# Patient Record
Sex: Male | Born: 1945 | Race: White | Hispanic: No | Marital: Married | Smoking: Former smoker
Health system: Southern US, Community
[De-identification: ages and names within clinical notes are randomized; demographics above are authoritative.]

## PROBLEM LIST (undated history)

## (undated) DIAGNOSIS — I219 Acute myocardial infarction, unspecified: Secondary | ICD-10-CM

## (undated) DIAGNOSIS — R200 Anesthesia of skin: Secondary | ICD-10-CM

## (undated) DIAGNOSIS — I10 Essential (primary) hypertension: Secondary | ICD-10-CM

## (undated) DIAGNOSIS — M47812 Spondylosis without myelopathy or radiculopathy, cervical region: Secondary | ICD-10-CM

## (undated) DIAGNOSIS — R202 Paresthesia of skin: Secondary | ICD-10-CM

## (undated) DIAGNOSIS — N189 Chronic kidney disease, unspecified: Secondary | ICD-10-CM

## (undated) DIAGNOSIS — I2510 Atherosclerotic heart disease of native coronary artery without angina pectoris: Secondary | ICD-10-CM

## (undated) DIAGNOSIS — M1990 Unspecified osteoarthritis, unspecified site: Secondary | ICD-10-CM

## (undated) DIAGNOSIS — I82409 Acute embolism and thrombosis of unspecified deep veins of unspecified lower extremity: Secondary | ICD-10-CM

## (undated) HISTORY — DX: Essential (primary) hypertension: I10

## (undated) HISTORY — DX: Chronic kidney disease, unspecified: N18.9

## (undated) HISTORY — DX: Atherosclerotic heart disease of native coronary artery without angina pectoris: I25.10

## (undated) HISTORY — PX: CARDIAC CATHETERIZATION: SHX172

## (undated) HISTORY — PX: JOINT REPLACEMENT: SHX530

## (undated) HISTORY — DX: Acute embolism and thrombosis of unspecified deep veins of unspecified lower extremity: I82.409

## (undated) HISTORY — DX: Acute myocardial infarction, unspecified: I21.9

---

## 2012-02-01 DIAGNOSIS — Z23 Encounter for immunization: Secondary | ICD-10-CM | POA: Diagnosis not present

## 2012-04-26 DIAGNOSIS — C44319 Basal cell carcinoma of skin of other parts of face: Secondary | ICD-10-CM | POA: Diagnosis not present

## 2012-04-26 DIAGNOSIS — D485 Neoplasm of uncertain behavior of skin: Secondary | ICD-10-CM | POA: Diagnosis not present

## 2012-04-26 DIAGNOSIS — L570 Actinic keratosis: Secondary | ICD-10-CM | POA: Diagnosis not present

## 2012-04-29 DIAGNOSIS — I2510 Atherosclerotic heart disease of native coronary artery without angina pectoris: Secondary | ICD-10-CM | POA: Diagnosis not present

## 2012-04-29 DIAGNOSIS — Z125 Encounter for screening for malignant neoplasm of prostate: Secondary | ICD-10-CM | POA: Diagnosis not present

## 2012-04-29 DIAGNOSIS — R350 Frequency of micturition: Secondary | ICD-10-CM | POA: Diagnosis not present

## 2012-04-29 DIAGNOSIS — I10 Essential (primary) hypertension: Secondary | ICD-10-CM | POA: Diagnosis not present

## 2012-04-29 DIAGNOSIS — N400 Enlarged prostate without lower urinary tract symptoms: Secondary | ICD-10-CM | POA: Diagnosis not present

## 2012-04-29 DIAGNOSIS — E785 Hyperlipidemia, unspecified: Secondary | ICD-10-CM | POA: Diagnosis not present

## 2012-05-08 DIAGNOSIS — R972 Elevated prostate specific antigen [PSA]: Secondary | ICD-10-CM | POA: Diagnosis not present

## 2012-05-08 DIAGNOSIS — N402 Nodular prostate without lower urinary tract symptoms: Secondary | ICD-10-CM | POA: Diagnosis not present

## 2012-05-20 DIAGNOSIS — Z9889 Other specified postprocedural states: Secondary | ICD-10-CM | POA: Diagnosis not present

## 2012-05-20 DIAGNOSIS — I2510 Atherosclerotic heart disease of native coronary artery without angina pectoris: Secondary | ICD-10-CM | POA: Diagnosis not present

## 2012-05-20 DIAGNOSIS — I10 Essential (primary) hypertension: Secondary | ICD-10-CM | POA: Diagnosis not present

## 2012-05-20 DIAGNOSIS — E785 Hyperlipidemia, unspecified: Secondary | ICD-10-CM | POA: Diagnosis not present

## 2012-05-20 DIAGNOSIS — I498 Other specified cardiac arrhythmias: Secondary | ICD-10-CM | POA: Diagnosis not present

## 2012-05-22 DIAGNOSIS — C44319 Basal cell carcinoma of skin of other parts of face: Secondary | ICD-10-CM | POA: Diagnosis not present

## 2012-05-28 DIAGNOSIS — Z4802 Encounter for removal of sutures: Secondary | ICD-10-CM | POA: Diagnosis not present

## 2012-06-27 DIAGNOSIS — I10 Essential (primary) hypertension: Secondary | ICD-10-CM | POA: Diagnosis not present

## 2012-06-27 DIAGNOSIS — I2510 Atherosclerotic heart disease of native coronary artery without angina pectoris: Secondary | ICD-10-CM | POA: Diagnosis not present

## 2012-06-27 DIAGNOSIS — E785 Hyperlipidemia, unspecified: Secondary | ICD-10-CM | POA: Diagnosis not present

## 2012-09-02 DIAGNOSIS — I10 Essential (primary) hypertension: Secondary | ICD-10-CM | POA: Diagnosis not present

## 2012-09-02 DIAGNOSIS — E785 Hyperlipidemia, unspecified: Secondary | ICD-10-CM | POA: Diagnosis not present

## 2012-09-02 DIAGNOSIS — I2510 Atherosclerotic heart disease of native coronary artery without angina pectoris: Secondary | ICD-10-CM | POA: Diagnosis not present

## 2012-09-24 DIAGNOSIS — E785 Hyperlipidemia, unspecified: Secondary | ICD-10-CM | POA: Diagnosis not present

## 2012-09-24 DIAGNOSIS — I2510 Atherosclerotic heart disease of native coronary artery without angina pectoris: Secondary | ICD-10-CM | POA: Diagnosis not present

## 2012-09-24 DIAGNOSIS — I10 Essential (primary) hypertension: Secondary | ICD-10-CM | POA: Diagnosis not present

## 2012-10-23 DIAGNOSIS — Z85828 Personal history of other malignant neoplasm of skin: Secondary | ICD-10-CM | POA: Diagnosis not present

## 2012-10-23 DIAGNOSIS — L570 Actinic keratosis: Secondary | ICD-10-CM | POA: Diagnosis not present

## 2012-10-23 DIAGNOSIS — L821 Other seborrheic keratosis: Secondary | ICD-10-CM | POA: Diagnosis not present

## 2013-02-18 DIAGNOSIS — Z85828 Personal history of other malignant neoplasm of skin: Secondary | ICD-10-CM | POA: Diagnosis not present

## 2013-02-18 DIAGNOSIS — L723 Sebaceous cyst: Secondary | ICD-10-CM | POA: Diagnosis not present

## 2013-02-18 DIAGNOSIS — D485 Neoplasm of uncertain behavior of skin: Secondary | ICD-10-CM | POA: Diagnosis not present

## 2013-02-18 DIAGNOSIS — L570 Actinic keratosis: Secondary | ICD-10-CM | POA: Diagnosis not present

## 2013-02-18 DIAGNOSIS — B079 Viral wart, unspecified: Secondary | ICD-10-CM | POA: Diagnosis not present

## 2013-03-26 DIAGNOSIS — E785 Hyperlipidemia, unspecified: Secondary | ICD-10-CM | POA: Diagnosis not present

## 2013-03-26 DIAGNOSIS — I10 Essential (primary) hypertension: Secondary | ICD-10-CM | POA: Diagnosis not present

## 2013-03-26 DIAGNOSIS — I2510 Atherosclerotic heart disease of native coronary artery without angina pectoris: Secondary | ICD-10-CM | POA: Diagnosis not present

## 2013-03-26 DIAGNOSIS — R972 Elevated prostate specific antigen [PSA]: Secondary | ICD-10-CM | POA: Diagnosis not present

## 2013-03-26 DIAGNOSIS — Z0000 Encounter for general adult medical examination without abnormal findings: Secondary | ICD-10-CM | POA: Diagnosis not present

## 2013-04-24 ENCOUNTER — Encounter: Payer: Self-pay | Admitting: *Deleted

## 2013-04-25 ENCOUNTER — Encounter: Payer: Self-pay | Admitting: *Deleted

## 2013-04-25 ENCOUNTER — Ambulatory Visit (INDEPENDENT_AMBULATORY_CARE_PROVIDER_SITE_OTHER): Payer: Medicare Other | Admitting: Cardiovascular Disease

## 2013-04-25 VITALS — BP 157/85 | HR 55 | Ht 69.0 in | Wt 238.0 lb

## 2013-04-25 DIAGNOSIS — N138 Other obstructive and reflux uropathy: Secondary | ICD-10-CM | POA: Diagnosis not present

## 2013-04-25 DIAGNOSIS — E119 Type 2 diabetes mellitus without complications: Secondary | ICD-10-CM | POA: Diagnosis not present

## 2013-04-25 DIAGNOSIS — E785 Hyperlipidemia, unspecified: Secondary | ICD-10-CM | POA: Diagnosis not present

## 2013-04-25 DIAGNOSIS — I2510 Atherosclerotic heart disease of native coronary artery without angina pectoris: Secondary | ICD-10-CM | POA: Diagnosis not present

## 2013-04-25 DIAGNOSIS — R739 Hyperglycemia, unspecified: Secondary | ICD-10-CM | POA: Insufficient documentation

## 2013-04-25 DIAGNOSIS — R351 Nocturia: Secondary | ICD-10-CM | POA: Diagnosis not present

## 2013-04-25 DIAGNOSIS — N139 Obstructive and reflux uropathy, unspecified: Secondary | ICD-10-CM | POA: Diagnosis not present

## 2013-04-25 DIAGNOSIS — I10 Essential (primary) hypertension: Secondary | ICD-10-CM | POA: Insufficient documentation

## 2013-04-25 DIAGNOSIS — N453 Epididymo-orchitis: Secondary | ICD-10-CM | POA: Diagnosis not present

## 2013-04-25 DIAGNOSIS — N401 Enlarged prostate with lower urinary tract symptoms: Secondary | ICD-10-CM | POA: Diagnosis not present

## 2013-04-25 NOTE — Assessment & Plan Note (Signed)
Stable with no angina and good activity level.  Continue medical Rx  

## 2013-04-25 NOTE — Assessment & Plan Note (Signed)
F/U labs next visit suspect he will do better with lipitor or crestor to get LDL to 70

## 2013-04-25 NOTE — Assessment & Plan Note (Signed)
Lisinopril just increased Follow home readings Suspect we would do better changing to Hyzaar 100/25  Will see in 6 weeks

## 2013-04-25 NOTE — Patient Instructions (Addendum)
Your physician recommends that you schedule a follow-up appointment in: [place] Your physician recommends that you continue on your current medications as directed. Please refer to the Current Medication list given to you today. DR Gwendolyn Grant   [phone] FOR  FAMILY  DOCTOR   DR [facility] [phone] DR  [name]  FOR  KNEES [phone]

## 2013-04-25 NOTE — Assessment & Plan Note (Signed)
Discussed low carb diet.  Target hemoglobin A1c is 6.5 or less.  Continue current medications.  

## 2013-04-25 NOTE — Progress Notes (Signed)
Patient ID: Gregory Guerrero, male   DOB: 04/25/1945, 68 y.o.   MRN: [id number]   68 yo form [place]  History of CAD.  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  Had some chest pains before procedures.  He has elevated lipids , HTN and DM.  He is sedentary Quit smoking 30 years ago.  Mild exertional dyspnea.  Poor diet.  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  Saw a cardiologist in [place] Last month but didn't like them  LDL was 113  He has a BP cuff at home but has not used it.       ROS: Denies fever, malais, weight loss, blurry vision, decreased visual acuity, cough, sputum, SOB, hemoptysis, pleuritic pain, palpitaitons, heartburn, abdominal pain, melena, lower extremity edema, claudication, or rash.  All other systems reviewed and negative   General: Affect appropriate Overweigtht white male  HEENT: normal Neck supple with no adenopathy JVP normal no bruits no thyromegaly Lungs clear with no wheezing and good diaphragmatic motion Heart:  S1/S2 no murmur,rub, gallop or click PMI normal Abdomen: benighn, BS positve, no tenderness, no AAA no bruit.  No HSM or HJR Distal pulses intact with no bruits No edema Neuro non-focal Skin warm and dry No muscular weakness  Medications Current Outpatient Prescriptions  Medication Sig Dispense Refill  . Ascorbic Acid (VITAMIN C) 1000 MG tablet Take 1,000 mg by mouth daily.      Marland Kitchen aspirin 325 MG EC tablet Take 325 mg by mouth daily.      Marland Kitchen glucosamine-chondroitin 500-400 MG tablet Take 1 tablet by mouth daily.      Marland Kitchen lisinopril (PRINIVIL,ZESTRIL) 20 MG tablet Take 20 mg by mouth 2 (two) times daily.      . metoprolol (LOPRESSOR) 50 MG tablet Take 50 mg by mouth 2 (two) times daily.      . Multiple Vitamin (MULTIVITAMIN WITH MINERALS) TABS tablet Take 1 tablet by mouth daily.      . simvastatin (ZOCOR) 40 MG tablet Take 40 mg by mouth daily.       No current facility-administered  medications for this visit.    Allergies Review of patient's allergies indicates no known allergies.  Family History: Family History  Problem Relation Age of Onset  . Cancer - Other Mother   . Heart attack Father   . Uterine cancer Sister     Social History: History   Social History  . Marital Status: Married    Spouse Name: N/A    Number of Children: N/A  . Years of Education: N/A   Occupational History  . Not on file.   Social History Main Topics  . Smoking status: Former Smoker    Types: Cigarettes    Quit date: 04/26/1983  . Smokeless tobacco: Never Used  . Alcohol Use: Not on file  . Drug Use: Not on file  . Sexual Activity: Not on file   Other Topics Concern  . Not on file   Social History Narrative  . No narrative on file    Electrocardiogram:  SR rate 54 normal   Assessment and Plan

## 2013-05-02 ENCOUNTER — Telehealth: Payer: Self-pay | Admitting: Cardiovascular Disease

## 2013-05-02 NOTE — Telephone Encounter (Signed)
ROI faxed to [facility] at [phone]  P) [phone]

## 2013-05-13 ENCOUNTER — Encounter: Payer: Self-pay | Admitting: Cardiovascular Disease

## 2013-05-13 ENCOUNTER — Ambulatory Visit (INDEPENDENT_AMBULATORY_CARE_PROVIDER_SITE_OTHER): Payer: Medicare Other | Admitting: Cardiovascular Disease

## 2013-05-13 VITALS — BP 155/96 | HR 60 | Ht 69.0 in | Wt 243.0 lb

## 2013-05-13 DIAGNOSIS — I10 Essential (primary) hypertension: Secondary | ICD-10-CM

## 2013-05-13 DIAGNOSIS — E785 Hyperlipidemia, unspecified: Secondary | ICD-10-CM

## 2013-05-13 DIAGNOSIS — I2510 Atherosclerotic heart disease of native coronary artery without angina pectoris: Secondary | ICD-10-CM | POA: Diagnosis not present

## 2013-05-13 MED ORDER — LOSARTAN POTASSIUM-HCTZ 100-25 MG PO TABS: 1.0000 | ORAL_TABLET | Freq: Every day | ORAL | Status: DC

## 2013-05-13 NOTE — Assessment & Plan Note (Signed)
Stop lisinopril  Add Hyzaar 100/25  BMET in 2 weeks f/u with me next available

## 2013-05-13 NOTE — Assessment & Plan Note (Signed)
Labs to be checked likely change to lipitor or crestor to achieve LDL under 70

## 2013-05-13 NOTE — Patient Instructions (Addendum)
Your physician has recommended you make the following change in your medication:   1. Stop Lisinopril.  2. Start Hyzaar 100-25 mg 1 tablet daily.   You will have fasting lipids and bmet done in 2 weeks at lab of your choice.  Your physician recommends that you schedule a follow-up appointment with Dr. Bing Plume Available appt.

## 2013-05-13 NOTE — Progress Notes (Signed)
Patient ID: Gregory Guerrero, male   DOB: 03-17-1946, 68 y.o.   MRN: [id number] 68 yo form [place] History of CAD. 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 Had some chest pains before procedures. He has elevated lipids , HTN and DM. He is sedentary Quit smoking 30 years ago. Mild exertional dyspnea. Poor diet. 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 Saw a cardiologist in [place] Last month but didn't like them LDL was 113   Home BP readings are high BP cuff is irratic and doesn't correlate with our BP though      ROS: Denies fever, malais, weight loss, blurry vision, decreased visual acuity, cough, sputum, SOB, hemoptysis, pleuritic pain, palpitaitons, heartburn, abdominal pain, melena, lower extremity edema, claudication, or rash.  All other systems reviewed and negative  General: Affect appropriate Healthy:  appears stated age 37: normal Neck supple with no adenopathy JVP normal no bruits no thyromegaly Lungs clear with no wheezing and good diaphragmatic motion Heart:  S1/S2 no murmur, no rub, gallop or click PMI normal Abdomen: benighn, BS positve, no tenderness, no AAA no bruit.  No HSM or HJR Distal pulses intact with no bruits No edema Neuro non-focal Skin warm and dry No muscular weakness   Current Outpatient Prescriptions  Medication Sig Dispense Refill  . Ascorbic Acid (VITAMIN C) 1000 MG tablet Take 1,000 mg by mouth daily.      Marland Kitchen aspirin 325 MG EC tablet Take 325 mg by mouth daily.      Marland Kitchen glucosamine-chondroitin 500-400 MG tablet Take 1 tablet by mouth daily.      Marland Kitchen lisinopril (PRINIVIL,ZESTRIL) 20 MG tablet Take 20 mg by mouth 2 (two) times daily.      . metoprolol (LOPRESSOR) 50 MG tablet Take 50 mg by mouth 2 (two) times daily.      . Multiple Vitamin (MULTIVITAMIN WITH MINERALS) TABS tablet Take 1 tablet by mouth daily.      . simvastatin (ZOCOR) 40 MG tablet Take 40 mg by mouth daily.       No current  facility-administered medications for this visit.    Allergies  Review of patient's allergies indicates no known allergies.  Electrocardiogram:  04/25/13  SR rate 54 normal   Assessment and Plan

## 2013-05-13 NOTE — Assessment & Plan Note (Signed)
Stable with no angina and good activity level.  Continue medical Rx  

## 2013-05-20 DIAGNOSIS — N453 Epididymo-orchitis: Secondary | ICD-10-CM | POA: Diagnosis not present

## 2013-05-20 DIAGNOSIS — N138 Other obstructive and reflux uropathy: Secondary | ICD-10-CM | POA: Diagnosis not present

## 2013-05-20 DIAGNOSIS — R351 Nocturia: Secondary | ICD-10-CM | POA: Diagnosis not present

## 2013-05-20 DIAGNOSIS — N401 Enlarged prostate with lower urinary tract symptoms: Secondary | ICD-10-CM | POA: Diagnosis not present

## 2013-05-20 DIAGNOSIS — N139 Obstructive and reflux uropathy, unspecified: Secondary | ICD-10-CM | POA: Diagnosis not present

## 2013-05-23 ENCOUNTER — Telehealth: Payer: Self-pay | Admitting: Cardiovascular Disease

## 2013-05-23 NOTE — Telephone Encounter (Signed)
Letterhead rec Back No Records On this Pt 2.6.15./kdm

## 2013-06-24 ENCOUNTER — Ambulatory Visit (INDEPENDENT_AMBULATORY_CARE_PROVIDER_SITE_OTHER): Payer: Medicare Other | Admitting: Cardiovascular Disease

## 2013-06-24 ENCOUNTER — Encounter: Payer: Self-pay | Admitting: Cardiovascular Disease

## 2013-06-24 VITALS — BP 147/91 | HR 59 | Ht 69.0 in | Wt 242.0 lb

## 2013-06-24 DIAGNOSIS — E785 Hyperlipidemia, unspecified: Secondary | ICD-10-CM

## 2013-06-24 DIAGNOSIS — I10 Essential (primary) hypertension: Secondary | ICD-10-CM

## 2013-06-24 DIAGNOSIS — Z79899 Other long term (current) drug therapy: Secondary | ICD-10-CM

## 2013-06-24 DIAGNOSIS — I2510 Atherosclerotic heart disease of native coronary artery without angina pectoris: Secondary | ICD-10-CM | POA: Diagnosis not present

## 2013-06-24 DIAGNOSIS — E119 Type 2 diabetes mellitus without complications: Secondary | ICD-10-CM

## 2013-06-24 LAB — HEPATIC FUNCTION PANEL
ALBUMIN: 4.2 g/dL (ref 3.5–5.2)
ALT: 19 U/L (ref 0–53)
AST: 33 U/L (ref 0–37)
Alkaline Phosphatase: 71 U/L (ref 39–117)
Bilirubin, Direct: 0.2 mg/dL (ref 0.0–0.3)
TOTAL PROTEIN: 7.2 g/dL (ref 6.0–8.3)
Total Bilirubin: 1.4 mg/dL — ABNORMAL HIGH (ref 0.3–1.2)

## 2013-06-24 LAB — LIPID PANEL
CHOL/HDL RATIO: 4
CHOLESTEROL: 178 mg/dL (ref 0–200)
HDL: 46.8 mg/dL (ref >39.00)
LDL CALC: 115 mg/dL — AB (ref 0–99)
TRIGLYCERIDES: 81 mg/dL (ref 0.0–149.0)
VLDL: 16.2 mg/dL (ref 0.0–40.0)

## 2013-06-24 NOTE — Progress Notes (Signed)
Patient ID: Gregory Guerrero, male   DOB: 1945/09/30, 68 y.o.   MRN: [id number] 68 yo form [place] History of CAD. 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 Had some chest pains before procedures. He has elevated lipids , HTN and DM. He is sedentary Quit smoking 30 years ago. Mild exertional dyspnea. Poor diet. 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 Saw a cardiologist in [place] Last month but didn't like them LDL was 113  Home BP readings are high BP cuff is irratic and doesn't correlate with our BP though   Taking hyzaar at night BP still high      ROS: Denies fever, malais, weight loss, blurry vision, decreased visual acuity, cough, sputum, SOB, hemoptysis, pleuritic pain, palpitaitons, heartburn, abdominal pain, melena, lower extremity edema, claudication, or rash.  All other systems reviewed and negative  General: Affect appropriate Healthy:  appears stated age 26: normal Neck supple with no adenopathy JVP normal no bruits no thyromegaly Lungs clear with no wheezing and good diaphragmatic motion Heart:  S1/S2 no murmur, no rub, gallop or click PMI normal Abdomen: benighn, BS positve, no tenderness, no AAA no bruit.  No HSM or HJR Distal pulses intact with no bruits No edema Neuro non-focal Skin warm and dry No muscular weakness   Current Outpatient Prescriptions  Medication Sig Dispense Refill  . Ascorbic Acid (VITAMIN C) 1000 MG tablet Take 1,000 mg by mouth daily.      Marland Kitchen aspirin 325 MG EC tablet Take 325 mg by mouth daily.      Marland Kitchen glucosamine-chondroitin 500-400 MG tablet Take 1 tablet by mouth daily.      Marland Kitchen losartan-hydrochlorothiazide (HYZAAR) 100-25 MG per tablet Take 1 tablet by mouth daily.  90 tablet  1  . metoprolol (LOPRESSOR) 50 MG tablet Take 50 mg by mouth 2 (two) times daily.      . Multiple Vitamin (MULTIVITAMIN WITH MINERALS) TABS tablet Take 1 tablet by mouth daily.      . simvastatin (ZOCOR) 40 MG  tablet Take 40 mg by mouth daily.      . tamsulosin (FLOMAX) 0.4 MG CAPS capsule Take 0.4 mg by mouth daily.        No current facility-administered medications for this visit.    Allergies  Review of patient's allergies indicates no known allergies.  Electrocardiogram:  Assessment and Plan

## 2013-06-24 NOTE — Assessment & Plan Note (Signed)
Stable with no angina and good activity level.  Continue medical Rx  

## 2013-06-24 NOTE — Addendum Note (Signed)
Addended by: Eulis Foster on: 06/24/2013 11:12 AM   Modules accepted: Orders

## 2013-06-24 NOTE — Assessment & Plan Note (Signed)
Will have fasting lipid and liver profile today may need stronger statin

## 2013-06-24 NOTE — Assessment & Plan Note (Signed)
Discussed low sodium diet and weight loss.  Take hyzaar in am  If still high next visit will have  To add ? norvasc latter in afternoon

## 2013-06-24 NOTE — Patient Instructions (Signed)
Your physician recommends that you schedule a follow-up appointment in: [place] Your physician recommends that you continue on your current medications as directed. Please refer to the Current Medication list given to you today.  Your physician recommends that you return for lab work in:  [place]  [place]  [place]  [place]  [phone]

## 2013-06-24 NOTE — Assessment & Plan Note (Signed)
Discussed low carb diet.  Target hemoglobin A1c is 6.5 or less.  Continue current medications.  

## 2013-08-21 ENCOUNTER — Ambulatory Visit (INDEPENDENT_AMBULATORY_CARE_PROVIDER_SITE_OTHER): Payer: Medicare Other | Admitting: Cardiovascular Disease

## 2013-08-21 ENCOUNTER — Encounter: Payer: Self-pay | Admitting: Cardiovascular Disease

## 2013-08-21 VITALS — BP 146/88 | HR 48 | Ht 69.0 in | Wt 237.8 lb

## 2013-08-21 DIAGNOSIS — E119 Type 2 diabetes mellitus without complications: Secondary | ICD-10-CM | POA: Diagnosis not present

## 2013-08-21 DIAGNOSIS — E785 Hyperlipidemia, unspecified: Secondary | ICD-10-CM

## 2013-08-21 DIAGNOSIS — I2510 Atherosclerotic heart disease of native coronary artery without angina pectoris: Secondary | ICD-10-CM

## 2013-08-21 DIAGNOSIS — I10 Essential (primary) hypertension: Secondary | ICD-10-CM | POA: Diagnosis not present

## 2013-08-21 MED ORDER — AMLODIPINE BESYLATE 10 MG PO TABS: 10.0000 mg | ORAL_TABLET | Freq: Every day | ORAL | Status: DC

## 2013-08-21 NOTE — Assessment & Plan Note (Signed)
Discussed low carb diet.  Target hemoglobin A1c is 6.5 or less.  Continue current medications.  

## 2013-08-21 NOTE — Assessment & Plan Note (Signed)
Well controlled.  Continue current medications and low sodium Dash type diet.    

## 2013-08-21 NOTE — Assessment & Plan Note (Signed)
Stable with no angina and good activity level.  Continue medical Rx  

## 2013-08-21 NOTE — Assessment & Plan Note (Signed)
Cholesterol is at goal.  Continue current dose of statin and diet Rx.  No myalgias or side effects.  F/U  LFT's in 6 months. Lab Results  Component Value Date   LDLCALC 115* 06/24/2013

## 2013-08-21 NOTE — Patient Instructions (Addendum)
Your physician recommends that you schedule a follow-up appointment in: NEXT AVAILABLE WITH  DR [facility] Your physician has recommended you make the following change in your medication: START  AMLODIPINE 10 MG    1  EVERY DAY

## 2013-08-21 NOTE — Progress Notes (Signed)
Patient ID: Gregory Guerrero, male   DOB: [birth year], 68 y.o.   MRN: [id number] 68 yo form [place] History of CAD. 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 Had some chest pains before procedures. He has elevated lipids , HTN and DM. He is sedentary Quit smoking 30 years ago. Mild exertional dyspnea. Poor diet. 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 Saw a cardiologist in [place] Last month but didn't like them LDL was 113  Here last mont 115   Home BP readings are high BP cuff is irratic and doesn't correlate with our BP though   Last visit had him take Hyzaar in am       ROS: Denies fever, malais, weight loss, blurry vision, decreased visual acuity, cough, sputum, SOB, hemoptysis, pleuritic pain, palpitaitons, heartburn, abdominal pain, melena, lower extremity edema, claudication, or rash.  All other systems reviewed and negative  General: Affect appropriate Healthy:  appears stated age 66: normal Neck supple with no adenopathy JVP normal no bruits no thyromegaly Lungs clear with no wheezing and good diaphragmatic motion Heart:  S1/S2 no murmur, no rub, gallop or click PMI normal Abdomen: benighn, BS positve, no tenderness, no AAA no bruit.  No HSM or HJR Distal pulses intact with no bruits No edema Neuro non-focal Skin warm and dry No muscular weakness   Current Outpatient Prescriptions  Medication Sig Dispense Refill  . Ascorbic Acid (VITAMIN C) 1000 MG tablet Take 1,000 mg by mouth daily.      Marland Kitchen aspirin 325 MG EC tablet Take 325 mg by mouth daily.      Marland Kitchen glucosamine-chondroitin 500-400 MG tablet Take 1 tablet by mouth daily.      Marland Kitchen losartan-hydrochlorothiazide (HYZAAR) 100-25 MG per tablet Take 1 tablet by mouth daily.  90 tablet  1  . Multiple Vitamin (MULTIVITAMIN WITH MINERALS) TABS tablet Take 1 tablet by mouth daily.      . simvastatin (ZOCOR) 40 MG tablet Take 40 mg by mouth daily.       No current  facility-administered medications for this visit.    Allergies  Review of patient's allergies indicates no known allergies.  Electrocardiogram:  Assessment and Plan

## 2013-10-23 DIAGNOSIS — M25569 Pain in unspecified knee: Secondary | ICD-10-CM | POA: Diagnosis not present

## 2013-10-28 ENCOUNTER — Ambulatory Visit: Payer: Medicare Other | Admitting: Cardiovascular Disease

## 2013-11-26 DIAGNOSIS — M1710 Unilateral primary osteoarthritis, unspecified knee: Secondary | ICD-10-CM | POA: Diagnosis not present

## 2013-12-03 ENCOUNTER — Ambulatory Visit (INDEPENDENT_AMBULATORY_CARE_PROVIDER_SITE_OTHER): Payer: Medicare Other | Admitting: Cardiovascular Disease

## 2013-12-03 ENCOUNTER — Encounter: Payer: Self-pay | Admitting: Cardiovascular Disease

## 2013-12-03 VITALS — BP 140/88 | HR 65 | Ht 69.0 in | Wt 233.0 lb

## 2013-12-03 DIAGNOSIS — E785 Hyperlipidemia, unspecified: Secondary | ICD-10-CM | POA: Diagnosis not present

## 2013-12-03 DIAGNOSIS — I2510 Atherosclerotic heart disease of native coronary artery without angina pectoris: Secondary | ICD-10-CM

## 2013-12-03 DIAGNOSIS — I10 Essential (primary) hypertension: Secondary | ICD-10-CM

## 2013-12-03 MED ORDER — AMLODIPINE BESYLATE 10 MG PO TABS: 10.0000 mg | ORAL_TABLET | Freq: Every day | ORAL | Status: DC

## 2013-12-03 MED ORDER — LOSARTAN POTASSIUM-HCTZ 100-25 MG PO TABS: 1.0000 | ORAL_TABLET | Freq: Every day | ORAL | Status: DC

## 2013-12-03 NOTE — Assessment & Plan Note (Signed)
Stable with no angina and good activity level.  Continue medical Rx  

## 2013-12-03 NOTE — Patient Instructions (Signed)
Your physician recommends that you continue on your current medications as directed. Please refer to the Current Medication list given to you today.  Your physician wants you to follow-up in: 1 year with Dr. Nishan. You will receive a reminder letter in the mail two months in advance. If you don't receive a letter, please call our office to schedule the follow-up appointment.  

## 2013-12-03 NOTE — Progress Notes (Signed)
Patient ID: Gregory Guerrero, male   DOB: [birth year], 68 y.o.   MRN: [id number] 68 yo form [place] History of CAD. 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 Had some chest pains before procedures. He has elevated lipids , HTN and DM. He is sedentary Quit smoking 30 years ago. Mild exertional dyspnea. Poor diet. 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 Saw a cardiologist in [place] Last month but didn't like them LDL was 113 Here last mont 115  Home BP readings are high BP cuff is irratic and doesn't correlate with our BP though  Last visit had him take Hyzaar in am    Not compliant with both meds ran out of hyzaar   ROS: Denies fever, malais, weight loss, blurry vision, decreased visual acuity, cough, sputum, SOB, hemoptysis, pleuritic pain, palpitaitons, heartburn, abdominal pain, melena, lower extremity edema, claudication, or rash.  All other systems reviewed and negative  General: Affect appropriate Healthy:  appears stated age 23: normal Neck supple with no adenopathy JVP normal no bruits no thyromegaly Lungs clear with no wheezing and good diaphragmatic motion Heart:  S1/S2 no murmur, no rub, gallop or click PMI normal Abdomen: benighn, BS positve, no tenderness, no AAA no bruit.  No HSM or HJR Distal pulses intact with no bruits No edema Neuro non-focal Skin warm and dry No muscular weakness   Current Outpatient Prescriptions  Medication Sig Dispense Refill  . amLODipine (NORVASC) 10 MG tablet Take 1 tablet (10 mg total) by mouth daily.  30 tablet  11  . Ascorbic Acid (VITAMIN C) 1000 MG tablet Take 1,000 mg by mouth daily.      Marland Kitchen aspirin 325 MG EC tablet Take 325 mg by mouth daily.      Marland Kitchen glucosamine-chondroitin 500-400 MG tablet Take 1 tablet by mouth daily.      Marland Kitchen losartan-hydrochlorothiazide (HYZAAR) 100-25 MG per tablet Take 1 tablet by mouth daily.  90 tablet  1  . Multiple Vitamin (MULTIVITAMIN WITH MINERALS)  TABS tablet Take 1 tablet by mouth daily.      . simvastatin (ZOCOR) 40 MG tablet Take 40 mg by mouth daily.       No current facility-administered medications for this visit.    Allergies  Review of patient's allergies indicates no known allergies.  Electrocardiogram:  SR rate 54 normal   Assessment and Plan

## 2013-12-03 NOTE — Assessment & Plan Note (Signed)
Cholesterol is at goal.  Continue current dose of statin and diet Rx.  No myalgias or side effects.  F/U  LFT's in 6 months. Lab Results  Component Value Date   LDLCALC 115* 06/24/2013

## 2013-12-03 NOTE — Assessment & Plan Note (Signed)
Discussed low carb diet.  Target hemoglobin A1c is 6.5 or less.  Continue current medications.  

## 2013-12-03 NOTE — Assessment & Plan Note (Signed)
Has not been compliant with both meds daily  Discussed issues  Called in refill for hyzaar

## 2013-12-04 DIAGNOSIS — M1710 Unilateral primary osteoarthritis, unspecified knee: Secondary | ICD-10-CM | POA: Diagnosis not present

## 2013-12-10 DIAGNOSIS — M1710 Unilateral primary osteoarthritis, unspecified knee: Secondary | ICD-10-CM | POA: Diagnosis not present

## 2014-01-29 DIAGNOSIS — M170 Bilateral primary osteoarthritis of knee: Secondary | ICD-10-CM | POA: Diagnosis not present

## 2014-10-28 ENCOUNTER — Other Ambulatory Visit: Payer: Self-pay | Admitting: *Deleted

## 2014-10-28 MED ORDER — SIMVASTATIN 40 MG PO TABS: 40.0000 mg | ORAL_TABLET | Freq: Every day | ORAL | Status: DC

## 2014-11-13 DIAGNOSIS — N138 Other obstructive and reflux uropathy: Secondary | ICD-10-CM | POA: Diagnosis not present

## 2014-11-13 DIAGNOSIS — N401 Enlarged prostate with lower urinary tract symptoms: Secondary | ICD-10-CM | POA: Diagnosis not present

## 2014-11-13 DIAGNOSIS — R3912 Poor urinary stream: Secondary | ICD-10-CM | POA: Diagnosis not present

## 2014-12-18 ENCOUNTER — Other Ambulatory Visit: Payer: Self-pay | Admitting: *Deleted

## 2014-12-18 MED ORDER — SIMVASTATIN 40 MG PO TABS: 40.0000 mg | ORAL_TABLET | Freq: Every day | ORAL | Status: DC

## 2014-12-18 MED ORDER — LOSARTAN POTASSIUM-HCTZ 100-25 MG PO TABS: 1.0000 | ORAL_TABLET | Freq: Every day | ORAL | Status: DC

## 2015-01-18 DIAGNOSIS — M4806 Spinal stenosis, lumbar region: Secondary | ICD-10-CM | POA: Diagnosis not present

## 2015-01-18 DIAGNOSIS — M5442 Lumbago with sciatica, left side: Secondary | ICD-10-CM | POA: Diagnosis not present

## 2015-01-18 DIAGNOSIS — M5441 Lumbago with sciatica, right side: Secondary | ICD-10-CM | POA: Diagnosis not present

## 2015-01-21 DIAGNOSIS — M4316 Spondylolisthesis, lumbar region: Secondary | ICD-10-CM | POA: Diagnosis not present

## 2015-01-21 DIAGNOSIS — M4806 Spinal stenosis, lumbar region: Secondary | ICD-10-CM | POA: Diagnosis not present

## 2015-01-21 DIAGNOSIS — M5442 Lumbago with sciatica, left side: Secondary | ICD-10-CM | POA: Diagnosis not present

## 2015-01-21 DIAGNOSIS — M5416 Radiculopathy, lumbar region: Secondary | ICD-10-CM | POA: Diagnosis not present

## 2015-02-01 DIAGNOSIS — M5136 Other intervertebral disc degeneration, lumbar region: Secondary | ICD-10-CM | POA: Diagnosis not present

## 2015-02-01 DIAGNOSIS — M4316 Spondylolisthesis, lumbar region: Secondary | ICD-10-CM | POA: Diagnosis not present

## 2015-02-01 DIAGNOSIS — Z7409 Other reduced mobility: Secondary | ICD-10-CM | POA: Diagnosis not present

## 2015-02-01 DIAGNOSIS — M5416 Radiculopathy, lumbar region: Secondary | ICD-10-CM | POA: Diagnosis not present

## 2015-02-01 DIAGNOSIS — M4806 Spinal stenosis, lumbar region: Secondary | ICD-10-CM | POA: Diagnosis not present

## 2015-02-03 DIAGNOSIS — M5136 Other intervertebral disc degeneration, lumbar region: Secondary | ICD-10-CM | POA: Diagnosis not present

## 2015-02-03 DIAGNOSIS — Z7409 Other reduced mobility: Secondary | ICD-10-CM | POA: Diagnosis not present

## 2015-02-03 DIAGNOSIS — M4806 Spinal stenosis, lumbar region: Secondary | ICD-10-CM | POA: Diagnosis not present

## 2015-02-03 DIAGNOSIS — M4316 Spondylolisthesis, lumbar region: Secondary | ICD-10-CM | POA: Diagnosis not present

## 2015-02-03 DIAGNOSIS — M5416 Radiculopathy, lumbar region: Secondary | ICD-10-CM | POA: Diagnosis not present

## 2015-02-09 DIAGNOSIS — Z23 Encounter for immunization: Secondary | ICD-10-CM | POA: Diagnosis not present

## 2015-02-09 DIAGNOSIS — M5136 Other intervertebral disc degeneration, lumbar region: Secondary | ICD-10-CM | POA: Diagnosis not present

## 2015-02-09 DIAGNOSIS — M4316 Spondylolisthesis, lumbar region: Secondary | ICD-10-CM | POA: Diagnosis not present

## 2015-02-09 DIAGNOSIS — M5416 Radiculopathy, lumbar region: Secondary | ICD-10-CM | POA: Diagnosis not present

## 2015-02-09 DIAGNOSIS — Z7409 Other reduced mobility: Secondary | ICD-10-CM | POA: Diagnosis not present

## 2015-02-09 DIAGNOSIS — M4806 Spinal stenosis, lumbar region: Secondary | ICD-10-CM | POA: Diagnosis not present

## 2015-02-11 DIAGNOSIS — M5416 Radiculopathy, lumbar region: Secondary | ICD-10-CM | POA: Diagnosis not present

## 2015-02-11 DIAGNOSIS — M5136 Other intervertebral disc degeneration, lumbar region: Secondary | ICD-10-CM | POA: Diagnosis not present

## 2015-02-11 DIAGNOSIS — Z7409 Other reduced mobility: Secondary | ICD-10-CM | POA: Diagnosis not present

## 2015-02-11 DIAGNOSIS — M4316 Spondylolisthesis, lumbar region: Secondary | ICD-10-CM | POA: Diagnosis not present

## 2015-02-11 DIAGNOSIS — M4806 Spinal stenosis, lumbar region: Secondary | ICD-10-CM | POA: Diagnosis not present

## 2015-02-15 DIAGNOSIS — M5416 Radiculopathy, lumbar region: Secondary | ICD-10-CM | POA: Diagnosis not present

## 2015-02-15 DIAGNOSIS — M4316 Spondylolisthesis, lumbar region: Secondary | ICD-10-CM | POA: Diagnosis not present

## 2015-02-15 DIAGNOSIS — Z7409 Other reduced mobility: Secondary | ICD-10-CM | POA: Diagnosis not present

## 2015-02-15 DIAGNOSIS — M5136 Other intervertebral disc degeneration, lumbar region: Secondary | ICD-10-CM | POA: Diagnosis not present

## 2015-02-15 DIAGNOSIS — M4806 Spinal stenosis, lumbar region: Secondary | ICD-10-CM | POA: Diagnosis not present

## 2015-02-16 DIAGNOSIS — M5136 Other intervertebral disc degeneration, lumbar region: Secondary | ICD-10-CM | POA: Diagnosis not present

## 2015-02-24 NOTE — Progress Notes (Addendum)
Patient ID: Gregory Guerrero, male   DOB: 11/09/1945, 69 y.o.   MRN: [id number] 69 y.o. form [place] History of CAD. 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 Had some chest pains before procedures. He has elevated lipids , HTN and DM. He is sedentary Quit smoking 30 years ago. Mild exertional dyspnea. Poor diet. 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 Saw a cardiologist in [place] Last month but didn't like them LDL was 113 Here last mont 115  Home BP readings are high BP cuff is irratic and doesn't correlate with our BP though  Last visit had him take Hyzaar in am   Not compliant with both meds ran out of hyzaar   ROS: Denies fever, malais, weight loss, blurry vision, decreased visual acuity, cough, sputum, SOB, hemoptysis, pleuritic pain, palpitaitons, heartburn, abdominal pain, melena, lower extremity edema, claudication, or rash.  All other systems reviewed and negative  General: Affect appropriate Healthy:  appears stated age 68: normal Neck supple with no adenopathy JVP normal no bruits no thyromegaly Lungs clear with no wheezing and good diaphragmatic motion Heart:  S1/S2 no murmur, no rub, gallop or click PMI normal Abdomen: benighn, BS positve, no tenderness, no AAA no bruit.  No HSM or HJR Distal pulses intact with no bruits No edema Neuro non-focal Skin warm and dry No muscular weakness   Current Outpatient Prescriptions  Medication Sig Dispense Refill  . Ascorbic Acid (VITAMIN C) 1000 MG tablet Take 1,000 mg by mouth daily.    Marland Kitchen aspirin 325 MG EC tablet Take 325 mg by mouth daily.    Marland Kitchen glucosamine-chondroitin 500-400 MG tablet Take 1 tablet by mouth daily.    . Multiple Vitamin (MULTIVITAMIN WITH MINERALS) TABS tablet Take 1 tablet by mouth daily.    Marland Kitchen losartan-hydrochlorothiazide (HYZAAR) 100-25 MG tablet Take 1 tablet by mouth daily. 90 tablet 3  . simvastatin (ZOCOR) 40 MG tablet Take 1 tablet (40 mg total) by  mouth daily. 90 tablet 3   No current facility-administered medications for this visit.    Allergies  Review of patient's allergies indicates no known allergies.  Electrocardiogram:  04/25/13  SR rate 54 normal   02/25/15  SR rate 63 normal   Assessment and Plan CAD:  Distant stent to LAD 2009 no angina continue medical Rx No ETT in 5 years will order   HTN:  Well controlled.  Continue current medications and low sodium Dash type diet.   Chol:  On statin f/u labs

## 2015-02-25 ENCOUNTER — Ambulatory Visit (INDEPENDENT_AMBULATORY_CARE_PROVIDER_SITE_OTHER): Payer: Medicare Other | Admitting: Cardiovascular Disease

## 2015-02-25 ENCOUNTER — Encounter: Payer: Self-pay | Admitting: Cardiovascular Disease

## 2015-02-25 VITALS — BP 160/84 | HR 63 | Ht 69.0 in | Wt 217.0 lb

## 2015-02-25 DIAGNOSIS — I2510 Atherosclerotic heart disease of native coronary artery without angina pectoris: Secondary | ICD-10-CM

## 2015-02-25 DIAGNOSIS — I10 Essential (primary) hypertension: Secondary | ICD-10-CM

## 2015-02-25 MED ORDER — SIMVASTATIN 40 MG PO TABS: 40.0000 mg | ORAL_TABLET | Freq: Every day | ORAL | Status: DC

## 2015-02-25 MED ORDER — LOSARTAN POTASSIUM-HCTZ 100-25 MG PO TABS: 1.0000 | ORAL_TABLET | Freq: Every day | ORAL | Status: DC

## 2015-02-25 NOTE — Patient Instructions (Addendum)
Medication Instructions:  Your physician recommends that you continue on your current medications as directed. Please refer to the Current Medication list given to you today.  Labwork: NONE  Testing/Procedures: Your physician has requested that you have an exercise tolerance test MONDAY if possible. For further information please visit [email or web address]. Please also follow instruction sheet, as given.  Follow-Up: Your physician wants you to follow-up in: 6 months with Dr. Johnsie Cancel. You will receive a reminder letter in the mail two months in advance. If you don't receive a letter, please call our office to schedule the follow-up appointment.  If you need a refill on your cardiac medications before your next appointment, please call your pharmacy.

## 2015-02-26 ENCOUNTER — Encounter: Payer: Medicare Other | Admitting: Cardiovascular Disease

## 2015-02-26 ENCOUNTER — Ambulatory Visit (INDEPENDENT_AMBULATORY_CARE_PROVIDER_SITE_OTHER): Payer: Medicare Other

## 2015-02-26 DIAGNOSIS — I2510 Atherosclerotic heart disease of native coronary artery without angina pectoris: Secondary | ICD-10-CM

## 2015-02-26 DIAGNOSIS — I10 Essential (primary) hypertension: Secondary | ICD-10-CM | POA: Diagnosis not present

## 2015-02-26 LAB — EXERCISE TOLERANCE TEST
MPHR: 127 {beats}/min
Rest HR: 60 {beats}/min

## 2015-02-26 MED ORDER — AMLODIPINE BESYLATE 5 MG PO TABS: 5.0000 mg | ORAL_TABLET | Freq: Every day | ORAL | Status: DC

## 2015-03-09 ENCOUNTER — Telehealth: Payer: Self-pay | Admitting: *Deleted

## 2015-03-09 NOTE — Telephone Encounter (Signed)
LM TO CALL BACK  APPEARS PT NEEDS  NEXT AVAILABLE APPT WITH  DR Johnsie Cancel  SEE  GXT  RESULTS .Adonis Housekeeper

## 2015-03-26 NOTE — Telephone Encounter (Signed)
LMTCB ./CY 

## 2015-03-26 NOTE — Telephone Encounter (Signed)
SPOKE WITH  PT'S WIFE .   WIFE HAS APPT  ON  04-27-15  WITH DR Johnsie Cancel   CAN CHECK PT'S  B/P AT THAT TIME  AS  PT  LIVES  ALMOST  AN HOUR AWAY  PT  WAS  STARTED   ON  AMLODIPINE    AFTER  HYPERTENSIVE   RESPONSE   DURING  GXT .Adonis Housekeeper

## 2015-03-26 NOTE — Telephone Encounter (Signed)
F/u    Pt's wife returning phone call.

## 2015-05-18 DIAGNOSIS — M4806 Spinal stenosis, lumbar region: Secondary | ICD-10-CM | POA: Diagnosis not present

## 2015-05-18 DIAGNOSIS — M5136 Other intervertebral disc degeneration, lumbar region: Secondary | ICD-10-CM | POA: Diagnosis not present

## 2015-06-02 DIAGNOSIS — M5136 Other intervertebral disc degeneration, lumbar region: Secondary | ICD-10-CM | POA: Diagnosis not present

## 2015-06-16 ENCOUNTER — Encounter: Payer: Self-pay | Admitting: Cardiovascular Disease

## 2015-06-16 ENCOUNTER — Ambulatory Visit (INDEPENDENT_AMBULATORY_CARE_PROVIDER_SITE_OTHER): Payer: Medicare Other | Admitting: Cardiovascular Disease

## 2015-06-16 VITALS — BP 134/80 | HR 62 | Resp 18 | Ht 68.0 in | Wt 222.0 lb

## 2015-06-16 DIAGNOSIS — I2510 Atherosclerotic heart disease of native coronary artery without angina pectoris: Secondary | ICD-10-CM

## 2015-06-16 DIAGNOSIS — I10 Essential (primary) hypertension: Secondary | ICD-10-CM

## 2015-06-16 NOTE — Progress Notes (Signed)
Patient ID: Gregory Guerrero, male   DOB: 07-25-1945, 70 y.o.   MRN: [id number]   69 y.o. form [place] History of CAD. 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 Had some chest pains before procedures. He has elevated lipids , HTN and DM. He is sedentary Quit smoking 30 years ago. Mild exertional dyspnea. Poor diet. 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 Saw a cardiologist in [place] Last year  but didn't like them LDL was 113 Here last mont 115  Home BP readings are high BP cuff is irratic and doesn't correlate with our BP though  Last visit had him take Hyzaar in am   Not compliant with both meds ran out of hyzaar  ETT:  02/25/15 normal ETT   Will need back surgery with [name] Also will need knee surgery Taking some aleve for pain   ROS: Denies fever, malais, weight loss, blurry vision, decreased visual acuity, cough, sputum, SOB, hemoptysis, pleuritic pain, palpitaitons, heartburn, abdominal pain, melena, lower extremity edema, claudication, or rash.  All other systems reviewed and negative  General: Affect appropriate Healthy:  appears stated age 23: normal Neck supple with no adenopathy JVP normal no bruits no thyromegaly Lungs clear with no wheezing and good diaphragmatic motion Heart:  S1/S2 no murmur, no rub, gallop or click PMI normal Abdomen: benighn, BS positve, no tenderness, no AAA no bruit.  No HSM or HJR Distal pulses intact with no bruits No edema Neuro non-focal Skin warm and dry No muscular weakness   Current Outpatient Prescriptions  Medication Sig Dispense Refill  . amLODipine (NORVASC) 5 MG tablet Take 1 tablet (5 mg total) by mouth daily. 90 tablet 3  . Ascorbic Acid (VITAMIN C) 1000 MG tablet Take 1,000 mg by mouth daily.    Marland Kitchen aspirin 325 MG EC tablet Take 325 mg by mouth daily.    Marland Kitchen glucosamine-chondroitin 500-400 MG tablet Take 1 tablet by mouth daily.    Marland Kitchen losartan-hydrochlorothiazide (HYZAAR) 100-25  MG tablet Take 1 tablet by mouth daily. 90 tablet 3  . Multiple Vitamin (MULTIVITAMIN WITH MINERALS) TABS tablet Take 1 tablet by mouth daily.    . simvastatin (ZOCOR) 40 MG tablet Take 1 tablet (40 mg total) by mouth daily. 90 tablet 3   No current facility-administered medications for this visit.    Allergies  Review of patient's allergies indicates no known allergies.  Electrocardiogram:  04/25/13  SR rate 54 normal   02/25/15  SR rate 63 normal   Assessment and Plan CAD:  Distant stent to LAD 2009 no angina continue medical  ETT 02/2015 normal   HTN:  Well controlled.  Continue current medications and low sodium Dash type diet.   Chol:  On statin f/u labs  Clear to have ortho surgery within the year   Jenkins Rouge

## 2015-06-16 NOTE — Patient Instructions (Signed)

## 2015-08-04 DIAGNOSIS — N401 Enlarged prostate with lower urinary tract symptoms: Secondary | ICD-10-CM | POA: Diagnosis not present

## 2015-08-04 DIAGNOSIS — N138 Other obstructive and reflux uropathy: Secondary | ICD-10-CM | POA: Diagnosis not present

## 2015-08-04 DIAGNOSIS — R351 Nocturia: Secondary | ICD-10-CM | POA: Diagnosis not present

## 2015-08-04 DIAGNOSIS — Z0000 Encounter for general adult medical examination without abnormal findings: Secondary | ICD-10-CM | POA: Diagnosis not present

## 2015-08-04 DIAGNOSIS — R3912 Poor urinary stream: Secondary | ICD-10-CM | POA: Diagnosis not present

## 2015-12-18 DIAGNOSIS — Z23 Encounter for immunization: Secondary | ICD-10-CM | POA: Diagnosis not present

## 2015-12-21 DIAGNOSIS — M4310 Spondylolisthesis, site unspecified: Secondary | ICD-10-CM | POA: Diagnosis not present

## 2015-12-21 DIAGNOSIS — M4806 Spinal stenosis, lumbar region: Secondary | ICD-10-CM | POA: Diagnosis not present

## 2015-12-21 DIAGNOSIS — M5442 Lumbago with sciatica, left side: Secondary | ICD-10-CM | POA: Diagnosis not present

## 2015-12-31 DIAGNOSIS — M5442 Lumbago with sciatica, left side: Secondary | ICD-10-CM | POA: Diagnosis not present

## 2016-01-10 DIAGNOSIS — M4150 Other secondary scoliosis, site unspecified: Secondary | ICD-10-CM | POA: Diagnosis not present

## 2016-01-10 DIAGNOSIS — M4806 Spinal stenosis, lumbar region: Secondary | ICD-10-CM | POA: Diagnosis not present

## 2016-01-10 DIAGNOSIS — M4310 Spondylolisthesis, site unspecified: Secondary | ICD-10-CM | POA: Diagnosis not present

## 2016-01-19 DIAGNOSIS — M419 Scoliosis, unspecified: Secondary | ICD-10-CM | POA: Diagnosis not present

## 2016-01-19 DIAGNOSIS — M47816 Spondylosis without myelopathy or radiculopathy, lumbar region: Secondary | ICD-10-CM | POA: Diagnosis not present

## 2016-01-19 DIAGNOSIS — M5136 Other intervertebral disc degeneration, lumbar region: Secondary | ICD-10-CM | POA: Diagnosis not present

## 2016-01-20 DIAGNOSIS — M4310 Spondylolisthesis, site unspecified: Secondary | ICD-10-CM | POA: Diagnosis not present

## 2016-01-20 DIAGNOSIS — M5136 Other intervertebral disc degeneration, lumbar region: Secondary | ICD-10-CM | POA: Diagnosis not present

## 2016-01-20 DIAGNOSIS — M4150 Other secondary scoliosis, site unspecified: Secondary | ICD-10-CM | POA: Diagnosis not present

## 2016-01-20 DIAGNOSIS — M5442 Lumbago with sciatica, left side: Secondary | ICD-10-CM | POA: Diagnosis not present

## 2016-01-21 ENCOUNTER — Encounter: Payer: Self-pay | Admitting: Cardiovascular Disease

## 2016-01-21 ENCOUNTER — Ambulatory Visit (INDEPENDENT_AMBULATORY_CARE_PROVIDER_SITE_OTHER): Payer: Medicare Other | Admitting: Cardiovascular Disease

## 2016-01-21 ENCOUNTER — Encounter (INDEPENDENT_AMBULATORY_CARE_PROVIDER_SITE_OTHER): Payer: Self-pay

## 2016-01-21 VITALS — BP 160/90 | HR 68 | Ht 68.0 in | Wt 231.0 lb

## 2016-01-21 DIAGNOSIS — I2510 Atherosclerotic heart disease of native coronary artery without angina pectoris: Secondary | ICD-10-CM

## 2016-01-21 NOTE — Progress Notes (Signed)
Patient ID: Gregory Guerrero, male   DOB: 1946-01-07, 70 y.o.   MRN: [id number]   69 y.o. form [place] History of CAD. 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 Had some chest pains before procedures. He has elevated lipids , HTN and DM. He is sedentary Quit smoking 30 years ago. Mild exertional dyspnea. Poor diet. 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 Saw a cardiologist in [place] Last year  but didn't like them LDL was 113 Here last mont 115  Home BP readings are high BP cuff is irratic and doesn't correlate with our BP though  Last visit had him take Hyzaar in am   Not compliant with both meds ran out of hyzaar  ETT:  02/25/15 normal ETT   Will need back surgery with [name] Also will need knee surgery Taking some aleve for pain   ROS: Denies fever, malais, weight loss, blurry vision, decreased visual acuity, cough, sputum, SOB, hemoptysis, pleuritic pain, palpitaitons, heartburn, abdominal pain, melena, lower extremity edema, claudication, or rash.  All other systems reviewed and negative  General: Affect appropriate Healthy:  appears stated age 70: normal Neck supple with no adenopathy JVP normal no bruits no thyromegaly Lungs clear with no wheezing and good diaphragmatic motion Heart:  S1/S2 no murmur, no rub, gallop or click PMI normal Abdomen: benighn, BS positve, no tenderness, no AAA no bruit.  No HSM or HJR Distal pulses intact with no bruits No edema Neuro non-focal Skin warm and dry No muscular weakness   Current Outpatient Prescriptions  Medication Sig Dispense Refill  . Ascorbic Acid (VITAMIN C) 1000 MG tablet Take 1,000 mg by mouth daily.    Marland Kitchen aspirin 325 MG EC tablet Take 325 mg by mouth daily.    Marland Kitchen losartan-hydrochlorothiazide (HYZAAR) 100-25 MG tablet Take 1 tablet by mouth daily. 90 tablet 3  . Multiple Vitamin (MULTIVITAMIN WITH MINERALS) TABS tablet Take 1 tablet by mouth daily.    . simvastatin (ZOCOR)  40 MG tablet Take 1 tablet (40 mg total) by mouth daily. 90 tablet 3   No current facility-administered medications for this visit.     Allergies  Review of patient's allergies indicates no known allergies.  Electrocardiogram:  04/25/13  SR rate 54 normal   02/25/15  SR rate 63 normal   Assessment and Plan CAD:  Distant stent to LAD 2009 no angina continue medical  ETT 02/2015 normal   HTN:  Well controlled.  Continue current medications and low sodium Dash type diet.   Chol:  On statin f/u labs  Clear to have ortho surgery note sent to Dr [name]

## 2016-01-21 NOTE — Patient Instructions (Signed)
Medication Instructions:  Same-no changes  Labwork: None  Testing/Procedures: None  Follow-Up: Your physician wants you to follow-up in: 6 months. You will receive a reminder letter in the mail two months in advance. If you don't receive a letter, please call our office to schedule the follow-up appointment.      If you need a refill on your cardiac medications before your next appointment, please call your pharmacy.   

## 2016-02-01 ENCOUNTER — Ambulatory Visit: Payer: Self-pay | Admitting: Physician Assistant

## 2016-02-22 ENCOUNTER — Encounter (HOSPITAL_COMMUNITY): Payer: Self-pay

## 2016-02-22 ENCOUNTER — Encounter (HOSPITAL_COMMUNITY)
Admission: RE | Admit: 2016-02-22 | Discharge: 2016-02-22 | Disposition: A | Discharge status: Home / Self Care | Payer: Medicare Other | Source: Ambulatory Visit | Attending: Orthopedic Surgery | Admitting: Orthopedic Surgery

## 2016-02-22 DIAGNOSIS — M4186 Other forms of scoliosis, lumbar region: Secondary | ICD-10-CM | POA: Diagnosis not present

## 2016-02-22 DIAGNOSIS — M48061 Spinal stenosis, lumbar region without neurogenic claudication: Secondary | ICD-10-CM | POA: Diagnosis not present

## 2016-02-22 DIAGNOSIS — Z01810 Encounter for preprocedural cardiovascular examination: Secondary | ICD-10-CM | POA: Diagnosis not present

## 2016-02-22 DIAGNOSIS — I10 Essential (primary) hypertension: Secondary | ICD-10-CM | POA: Diagnosis not present

## 2016-02-22 DIAGNOSIS — Z01812 Encounter for preprocedural laboratory examination: Secondary | ICD-10-CM | POA: Diagnosis not present

## 2016-02-22 LAB — SURGICAL PCR SCREEN
MRSA, PCR: NEGATIVE
Staphylococcus aureus: NEGATIVE

## 2016-02-22 LAB — BASIC METABOLIC PANEL
Anion gap: 8 (ref 5–15)
BUN: 32 mg/dL — ABNORMAL HIGH (ref 6–20)
CALCIUM: 9.5 mg/dL (ref 8.9–10.3)
CO2: 24 mmol/L (ref 22–32)
CREATININE: 1.5 mg/dL — AB (ref 0.61–1.24)
Chloride: 109 mmol/L (ref 101–111)
GFR, EST AFRICAN AMERICAN: 52 mL/min — AB (ref >60)
GFR, EST NON AFRICAN AMERICAN: 45 mL/min — AB (ref >60)
GLUCOSE: 104 mg/dL — AB (ref 65–99)
Potassium: 4.2 mmol/L (ref 3.5–5.1)
Sodium: 141 mmol/L (ref 135–145)

## 2016-02-22 LAB — TYPE AND SCREEN
ABO/RH(D): O POS
Antibody Screen: NEGATIVE

## 2016-02-22 LAB — CBC
HCT: 41.1 % (ref 39.0–52.0)
Hemoglobin: 13.7 g/dL (ref 13.0–17.0)
MCH: 31.3 pg (ref 26.0–34.0)
MCHC: 33.3 g/dL (ref 30.0–36.0)
MCV: 93.8 fL (ref 78.0–100.0)
PLATELETS: 163 10*3/uL (ref 150–400)
RBC: 4.38 MIL/uL (ref 4.22–5.81)
RDW: 12.6 % (ref 11.5–15.5)
WBC: 9.4 10*3/uL (ref 4.0–10.5)

## 2016-02-22 LAB — ABO/RH: ABO/RH(D): O POS

## 2016-02-22 NOTE — Progress Notes (Signed)
This patient scored at an elevated risk for obstructive sleep apnea using the STOP BANG TOOL during a pre surgical testing 

## 2016-02-22 NOTE — Pre-Procedure Instructions (Signed)
    Gregory Guerrero  02/22/2016      [facility] [address], [place] [address] Phone: [phone] Fax: [phone]    Your procedure is scheduled on March 01, 2016.  Report to [facility] Admitting at 6:30 A.M.  Call this number if you have problems the morning of surgery:  [phone]   Remember:  Do not eat food or drink liquids after midnight.  Take these medicines the morning of surgery with A SIP OF WATER : amLODipine (NORVASC)   STOP ASPIRIN, VITAMINS, NSAID'S (ALEVE, ADVIL, IBUPROFEN) ONE WEEK PRIOR TO SURGERY   Do not wear jewelry, make-up or nail polish.  Do not wear lotions, powders, or perfumes, or deoderant.  Do not shave 48 hours prior to surgery.  Men may shave face and neck.  Do not bring valuables to the hospital.  [facility] is not responsible for any belongings or valuables.  Contacts, dentures or bridgework may not be worn into surgery.  Leave your suitcase in the car.  After surgery it may be brought to your room.  For patients admitted to the hospital, discharge time will be determined by your treatment team.  Patients discharged the day of surgery will not be allowed to drive home.   Name and phone number of your driver:     Please read over the following fact sheets that you were given. Pain Booklet and Blood Transfusion Information

## 2016-02-22 NOTE — Progress Notes (Signed)
Pt denies CP, SOB and states he does not have a PCP

## 2016-02-23 ENCOUNTER — Telehealth: Payer: Self-pay

## 2016-02-23 ENCOUNTER — Other Ambulatory Visit: Payer: Self-pay | Admitting: *Deleted

## 2016-02-23 DIAGNOSIS — E785 Hyperlipidemia, unspecified: Secondary | ICD-10-CM

## 2016-02-23 DIAGNOSIS — I2510 Atherosclerotic heart disease of native coronary artery without angina pectoris: Secondary | ICD-10-CM

## 2016-02-23 MED ORDER — SIMVASTATIN 40 MG PO TABS: 40.0000 mg | ORAL_TABLET | Freq: Every day | ORAL | 0 refills | Status: DC

## 2016-02-23 NOTE — Telephone Encounter (Signed)
Patient's wife Gregory Guerrero wanted Dr. Johnsie Cancel to know that her husband is having back surgery on November 15th and 16th with Dr. [name]. Will forward to Dr. Johnsie Cancel so he is aware.

## 2016-02-23 NOTE — Telephone Encounter (Signed)
Patient needs lipid and liver panel, order in. Patient will have done after his back surgery. Patient will call to schedule. Will send refill to cover patient until he comes in for lab work.

## 2016-02-23 NOTE — Telephone Encounter (Signed)
Please advise on refill request at the most recent lipid panel in epic is from 26. Recent office visit just has pt is on statin f/u labs. Thanks, MI

## 2016-02-23 NOTE — Progress Notes (Signed)
Anesthesia Chart Review:  Pt is a 70 year old male scheduled for L2-4 XLIF on 03/01/2016 with [name], MD.   - Cardiologist is Jenkins Rouge, MD, who cleared pt for surgery at last office visit 01/21/16.   PMH includes:  CAD (stent to LAD 2009 in [place]), HTN.  Former smoker. BMI 37  Medications include: amlodipine, ASA, losartan-hcz.   Preoperative labs reviewed.  Cr 1.5, BUN 32. No prior results for comparison. Pt denies having PCP.   EKG 02/22/16: Sinus rhythm with frequent Premature ventricular complexes  ETT 02/25/15:  normal ETT   If no changes, I anticipate pt can proceed with surgery as scheduled.   Willeen Cass, FNP-BC [facility]/Anesthesiology Phone: [phone] 02/23/2016 2:30 PM

## 2016-02-29 MED ORDER — CEFAZOLIN SODIUM-DEXTROSE 2-4 GM/100ML-% IV SOLN
2.0000 g | INTRAVENOUS | Status: AC
  Administered 2016-03-01: 2 g via INTRAVENOUS
  Filled 2016-02-29: qty 100

## 2016-03-01 ENCOUNTER — Encounter (HOSPITAL_COMMUNITY)
Admission: RE | Disposition: A | Discharge status: Home Health | Payer: Self-pay | Source: Ambulatory Visit | Attending: Orthopedic Surgery

## 2016-03-01 ENCOUNTER — Inpatient Hospital Stay (HOSPITAL_COMMUNITY): Payer: Medicare Other | Admitting: Anesthesiology

## 2016-03-01 ENCOUNTER — Encounter (HOSPITAL_COMMUNITY): Payer: Self-pay | Admitting: General Practice

## 2016-03-01 ENCOUNTER — Inpatient Hospital Stay (HOSPITAL_COMMUNITY)
Admission: RE | Admit: 2016-03-01 | Discharge: 2016-03-07 | DRG: 454 | Disposition: A | Discharge status: Home Health | Payer: Medicare Other | Source: Ambulatory Visit | Attending: Orthopedic Surgery | Admitting: Orthopedic Surgery

## 2016-03-01 ENCOUNTER — Inpatient Hospital Stay (HOSPITAL_COMMUNITY): Payer: Medicare Other | Admitting: Emergency Medicine

## 2016-03-01 ENCOUNTER — Inpatient Hospital Stay (HOSPITAL_COMMUNITY): Payer: Medicare Other

## 2016-03-01 DIAGNOSIS — Z8249 Family history of ischemic heart disease and other diseases of the circulatory system: Secondary | ICD-10-CM | POA: Diagnosis not present

## 2016-03-01 DIAGNOSIS — M419 Scoliosis, unspecified: Secondary | ICD-10-CM | POA: Diagnosis not present

## 2016-03-01 DIAGNOSIS — I10 Essential (primary) hypertension: Secondary | ICD-10-CM | POA: Diagnosis present

## 2016-03-01 DIAGNOSIS — I2510 Atherosclerotic heart disease of native coronary artery without angina pectoris: Secondary | ICD-10-CM | POA: Diagnosis present

## 2016-03-01 DIAGNOSIS — M5116 Intervertebral disc disorders with radiculopathy, lumbar region: Secondary | ICD-10-CM | POA: Diagnosis not present

## 2016-03-01 DIAGNOSIS — M5441 Lumbago with sciatica, right side: Secondary | ICD-10-CM | POA: Diagnosis present

## 2016-03-01 DIAGNOSIS — K5903 Drug induced constipation: Secondary | ICD-10-CM | POA: Diagnosis not present

## 2016-03-01 DIAGNOSIS — I82493 Acute embolism and thrombosis of other specified deep vein of lower extremity, bilateral: Secondary | ICD-10-CM | POA: Diagnosis not present

## 2016-03-01 DIAGNOSIS — Z87891 Personal history of nicotine dependence: Secondary | ICD-10-CM

## 2016-03-01 DIAGNOSIS — T402X5A Adverse effect of other opioids, initial encounter: Secondary | ICD-10-CM | POA: Diagnosis not present

## 2016-03-01 DIAGNOSIS — M5442 Lumbago with sciatica, left side: Secondary | ICD-10-CM | POA: Diagnosis present

## 2016-03-01 DIAGNOSIS — Z419 Encounter for procedure for purposes other than remedying health state, unspecified: Secondary | ICD-10-CM

## 2016-03-01 DIAGNOSIS — M48061 Spinal stenosis, lumbar region without neurogenic claudication: Secondary | ICD-10-CM | POA: Diagnosis present

## 2016-03-01 DIAGNOSIS — M5117 Intervertebral disc disorders with radiculopathy, lumbosacral region: Secondary | ICD-10-CM | POA: Diagnosis present

## 2016-03-01 DIAGNOSIS — Z9889 Other specified postprocedural states: Secondary | ICD-10-CM | POA: Diagnosis not present

## 2016-03-01 DIAGNOSIS — Z7982 Long term (current) use of aspirin: Secondary | ICD-10-CM

## 2016-03-01 DIAGNOSIS — M4316 Spondylolisthesis, lumbar region: Secondary | ICD-10-CM | POA: Diagnosis not present

## 2016-03-01 DIAGNOSIS — M4186 Other forms of scoliosis, lumbar region: Secondary | ICD-10-CM | POA: Diagnosis present

## 2016-03-01 DIAGNOSIS — R079 Chest pain, unspecified: Secondary | ICD-10-CM | POA: Diagnosis not present

## 2016-03-01 DIAGNOSIS — I252 Old myocardial infarction: Secondary | ICD-10-CM

## 2016-03-01 DIAGNOSIS — Q76426 Congenital lordosis, lumbar region: Secondary | ICD-10-CM | POA: Diagnosis not present

## 2016-03-01 DIAGNOSIS — M5136 Other intervertebral disc degeneration, lumbar region: Secondary | ICD-10-CM | POA: Diagnosis not present

## 2016-03-01 DIAGNOSIS — Z981 Arthrodesis status: Secondary | ICD-10-CM | POA: Diagnosis not present

## 2016-03-01 DIAGNOSIS — M4320 Fusion of spine, site unspecified: Secondary | ICD-10-CM

## 2016-03-01 HISTORY — PX: ANTERIOR LAT LUMBAR FUSION: SHX1168

## 2016-03-01 SURGERY — ANTERIOR LATERAL LUMBAR FUSION 2 LEVELS
Anesthesia: General | Site: Spine Lumbar

## 2016-03-01 MED ORDER — DEXAMETHASONE SODIUM PHOSPHATE 10 MG/ML IJ SOLN
INTRAMUSCULAR | Status: AC
  Filled 2016-03-01: qty 1

## 2016-03-01 MED ORDER — METHOCARBAMOL 1000 MG/10ML IJ SOLN: 500.0000 mg | Freq: Four times a day (QID) | INTRAVENOUS | Status: DC | PRN

## 2016-03-01 MED ORDER — LIDOCAINE HCL (CARDIAC) 20 MG/ML IV SOLN
INTRAVENOUS | Status: DC | PRN
  Administered 2016-03-01: 80 mg via INTRAVENOUS

## 2016-03-01 MED ORDER — PROPOFOL 10 MG/ML IV BOLUS
INTRAVENOUS | Status: AC
  Filled 2016-03-01: qty 20

## 2016-03-01 MED ORDER — PHENYLEPHRINE 40 MCG/ML (10ML) SYRINGE FOR IV PUSH (FOR BLOOD PRESSURE SUPPORT)
PREFILLED_SYRINGE | INTRAVENOUS | Status: AC
  Filled 2016-03-01: qty 10

## 2016-03-01 MED ORDER — SODIUM CHLORIDE 0.9% FLUSH: 3.0000 mL | Freq: Two times a day (BID) | INTRAVENOUS | Status: DC

## 2016-03-01 MED ORDER — PHENYLEPHRINE HCL 10 MG/ML IJ SOLN
INTRAVENOUS | Status: DC | PRN
  Administered 2016-03-01: 20 ug/min via INTRAVENOUS

## 2016-03-01 MED ORDER — LIDOCAINE 2% (20 MG/ML) 5 ML SYRINGE
INTRAMUSCULAR | Status: AC
  Filled 2016-03-01: qty 5

## 2016-03-01 MED ORDER — LACTATED RINGERS IV SOLN
INTRAVENOUS | Status: DC | PRN
  Administered 2016-03-01: 09:00:00 via INTRAVENOUS

## 2016-03-01 MED ORDER — CEFAZOLIN IN D5W 1 GM/50ML IV SOLN
1.0000 g | Freq: Three times a day (TID) | INTRAVENOUS | Status: DC
  Administered 2016-03-01 – 2016-03-02 (×2): 1 g via INTRAVENOUS
  Filled 2016-03-01 (×5): qty 50

## 2016-03-01 MED ORDER — DEXAMETHASONE SODIUM PHOSPHATE 10 MG/ML IJ SOLN
INTRAMUSCULAR | Status: DC | PRN
  Administered 2016-03-01: 10 mg via INTRAVENOUS

## 2016-03-01 MED ORDER — HYDROMORPHONE HCL 1 MG/ML IJ SOLN
0.2500 mg | INTRAMUSCULAR | Status: DC | PRN
  Administered 2016-03-01 (×3): 0.5 mg via INTRAVENOUS

## 2016-03-01 MED ORDER — MIDAZOLAM HCL 2 MG/2ML IJ SOLN
INTRAMUSCULAR | Status: AC
  Filled 2016-03-01: qty 2

## 2016-03-01 MED ORDER — ACETAMINOPHEN 10 MG/ML IV SOLN
1000.0000 mg | INTRAVENOUS | Status: DC
  Filled 2016-03-01: qty 100

## 2016-03-01 MED ORDER — PHENYLEPHRINE HCL 10 MG/ML IJ SOLN
INTRAMUSCULAR | Status: DC | PRN
  Administered 2016-03-01 (×3): 40 ug via INTRAVENOUS

## 2016-03-01 MED ORDER — SODIUM CHLORIDE 0.9 % IV SOLN: 250.0000 mL | INTRAVENOUS | Status: DC

## 2016-03-01 MED ORDER — ONDANSETRON HCL 4 MG/2ML IJ SOLN
INTRAMUSCULAR | Status: DC | PRN
  Administered 2016-03-01: 4 mg via INTRAVENOUS

## 2016-03-01 MED ORDER — PHENOL 1.4 % MT LIQD: 1.0000 | OROMUCOSAL | Status: DC | PRN

## 2016-03-01 MED ORDER — ACETAMINOPHEN 10 MG/ML IV SOLN
1000.0000 mg | INTRAVENOUS | Status: AC
Start: 2016-03-01 — End: 2016-03-01
  Administered 2016-03-01: 1000 mg via INTRAVENOUS

## 2016-03-01 MED ORDER — LOSARTAN POTASSIUM 50 MG PO TABS
100.0000 mg | ORAL_TABLET | Freq: Every day | ORAL | Status: DC
Start: 2016-03-01 — End: 2016-03-02
  Administered 2016-03-01: 100 mg via ORAL
  Filled 2016-03-01: qty 2

## 2016-03-01 MED ORDER — LACTATED RINGERS IV SOLN
INTRAVENOUS | Status: DC
  Administered 2016-03-02 (×4): via INTRAVENOUS

## 2016-03-01 MED ORDER — SUCCINYLCHOLINE CHLORIDE 200 MG/10ML IV SOSY
PREFILLED_SYRINGE | INTRAVENOUS | Status: AC
  Filled 2016-03-01: qty 10

## 2016-03-01 MED ORDER — ONDANSETRON HCL 4 MG/2ML IJ SOLN
INTRAMUSCULAR | Status: AC
  Filled 2016-03-01: qty 2

## 2016-03-01 MED ORDER — LOSARTAN POTASSIUM-HCTZ 100-25 MG PO TABS: 1.0000 | ORAL_TABLET | Freq: Every day | ORAL | Status: DC

## 2016-03-01 MED ORDER — MENTHOL 3 MG MT LOZG: 1.0000 | LOZENGE | OROMUCOSAL | Status: DC | PRN

## 2016-03-01 MED ORDER — LIDOCAINE-EPINEPHRINE (PF) 1 %-1:200000 IJ SOLN
INTRAMUSCULAR | Status: AC
  Filled 2016-03-01: qty 30

## 2016-03-01 MED ORDER — DEXAMETHASONE SODIUM PHOSPHATE 4 MG/ML IJ SOLN
4.0000 mg | Freq: Four times a day (QID) | INTRAMUSCULAR | Status: DC
  Administered 2016-03-02: 4 mg via INTRAVENOUS
  Filled 2016-03-01: qty 1

## 2016-03-01 MED ORDER — FENTANYL CITRATE (PF) 100 MCG/2ML IJ SOLN
INTRAMUSCULAR | Status: AC
  Filled 2016-03-01: qty 4

## 2016-03-01 MED ORDER — SODIUM CHLORIDE 0.9% FLUSH: 3.0000 mL | INTRAVENOUS | Status: DC | PRN

## 2016-03-01 MED ORDER — PROPOFOL 10 MG/ML IV BOLUS
INTRAVENOUS | Status: DC | PRN
Start: 2016-03-01 — End: 2016-03-01
  Administered 2016-03-01: 150 mg via INTRAVENOUS
  Administered 2016-03-01: 50 mg via INTRAVENOUS

## 2016-03-01 MED ORDER — LACTATED RINGERS IV SOLN
INTRAVENOUS | Status: DC | PRN
  Administered 2016-03-01 (×2): via INTRAVENOUS

## 2016-03-01 MED ORDER — 0.9 % SODIUM CHLORIDE (POUR BTL) OPTIME
TOPICAL | Status: DC | PRN
  Administered 2016-03-01 (×2): 1000 mL

## 2016-03-01 MED ORDER — METHOCARBAMOL 500 MG PO TABS: 500.0000 mg | ORAL_TABLET | Freq: Four times a day (QID) | ORAL | Status: DC | PRN

## 2016-03-01 MED ORDER — HEMOSTATIC AGENTS (NO CHARGE) OPTIME
TOPICAL | Status: DC | PRN
  Administered 2016-03-01: 1 via TOPICAL

## 2016-03-01 MED ORDER — PROPOFOL 500 MG/50ML IV EMUL
INTRAVENOUS | Status: DC | PRN
  Administered 2016-03-01: 50 ug/kg/min via INTRAVENOUS

## 2016-03-01 MED ORDER — ONDANSETRON HCL 4 MG/2ML IJ SOLN
4.0000 mg | INTRAMUSCULAR | Status: DC | PRN
  Administered 2016-03-01: 4 mg via INTRAVENOUS
  Filled 2016-03-01: qty 2

## 2016-03-01 MED ORDER — MORPHINE SULFATE (PF) 2 MG/ML IV SOLN
1.0000 mg | INTRAVENOUS | Status: DC | PRN
  Administered 2016-03-01: 2 mg via INTRAVENOUS
  Filled 2016-03-01: qty 1

## 2016-03-01 MED ORDER — HYDROCHLOROTHIAZIDE 25 MG PO TABS
25.0000 mg | ORAL_TABLET | Freq: Every day | ORAL | Status: DC
  Administered 2016-03-01: 25 mg via ORAL
  Filled 2016-03-01: qty 1

## 2016-03-01 MED ORDER — FENTANYL CITRATE (PF) 100 MCG/2ML IJ SOLN
INTRAMUSCULAR | Status: AC
  Filled 2016-03-01: qty 2

## 2016-03-01 MED ORDER — DEXAMETHASONE 4 MG PO TABS
4.0000 mg | ORAL_TABLET | Freq: Four times a day (QID) | ORAL | Status: DC
  Administered 2016-03-01: 4 mg via ORAL
  Filled 2016-03-01: qty 1

## 2016-03-01 MED ORDER — PROMETHAZINE HCL 25 MG/ML IJ SOLN: 6.2500 mg | INTRAMUSCULAR | Status: DC | PRN

## 2016-03-01 MED ORDER — FENTANYL CITRATE (PF) 100 MCG/2ML IJ SOLN
INTRAMUSCULAR | Status: DC | PRN
  Administered 2016-03-01: 50 ug via INTRAVENOUS
  Administered 2016-03-01: 100 ug via INTRAVENOUS
  Administered 2016-03-01 (×2): 50 ug via INTRAVENOUS

## 2016-03-01 MED ORDER — AMLODIPINE BESYLATE 10 MG PO TABS
10.0000 mg | ORAL_TABLET | Freq: Every day | ORAL | Status: DC
  Administered 2016-03-01: 10 mg via ORAL
  Filled 2016-03-01: qty 1

## 2016-03-01 MED ORDER — OXYCODONE HCL 5 MG PO TABS: 10.0000 mg | ORAL_TABLET | ORAL | Status: DC | PRN

## 2016-03-01 MED ORDER — THROMBIN 20000 UNITS EX SOLR
CUTANEOUS | Status: AC
  Filled 2016-03-01: qty 20000

## 2016-03-01 MED ORDER — MIDAZOLAM HCL 5 MG/5ML IJ SOLN
INTRAMUSCULAR | Status: DC | PRN
  Administered 2016-03-01: 1 mg via INTRAVENOUS

## 2016-03-01 MED ORDER — HYDROMORPHONE HCL 2 MG/ML IJ SOLN
INTRAMUSCULAR | Status: AC
  Filled 2016-03-01: qty 1

## 2016-03-01 MED ORDER — SUCCINYLCHOLINE CHLORIDE 20 MG/ML IJ SOLN
INTRAMUSCULAR | Status: DC | PRN
  Administered 2016-03-01: 120 mg via INTRAVENOUS

## 2016-03-01 SURGICAL SUPPLY — 81 items
APPLIER CLIP 11 MED OPEN (CLIP)
ATTRACTOMAT 16X20 MAGNETIC DRP (DRAPES) IMPLANT
BLADE SURG 10 STRL SS (BLADE) ×3 IMPLANT
BLADE SURG ROTATE 9660 (MISCELLANEOUS) IMPLANT
BONE MATRIX OSTEOCEL PRO MED (Bone Implant) ×6 IMPLANT
CAGE COROENT XLWTI 10X22X60-10 (Cage) ×3 IMPLANT
CAGE COROENT XLWTI 12X22X60-10 (Cage) ×3 IMPLANT
CATH FOLEY 2WAY SLVR  5CC 12FR (CATHETERS) ×2
CATH FOLEY 2WAY SLVR 5CC 12FR (CATHETERS) ×1 IMPLANT
CLIP APPLIE 11 MED OPEN (CLIP) IMPLANT
CLOSURE STERI-STRIP 1/2X4 (GAUZE/BANDAGES/DRESSINGS) ×1
CLOSURE WOUND 1/2 X4 (GAUZE/BANDAGES/DRESSINGS)
CLSR STERI-STRIP ANTIMIC 1/2X4 (GAUZE/BANDAGES/DRESSINGS) ×2 IMPLANT
CORDS BIPOLAR (ELECTRODE) ×3 IMPLANT
COVER SURGICAL LIGHT HANDLE (MISCELLANEOUS) ×3 IMPLANT
DERMABOND ADVANCED (GAUZE/BANDAGES/DRESSINGS)
DERMABOND ADVANCED .7 DNX12 (GAUZE/BANDAGES/DRESSINGS) IMPLANT
DRAPE C-ARM 42X72 X-RAY (DRAPES) ×3 IMPLANT
DRAPE C-ARMOR (DRAPES) ×3 IMPLANT
DRAPE ORTHO SPLIT 77X108 STRL (DRAPES) ×2
DRAPE POUCH INSTRU U-SHP 10X18 (DRAPES) ×3 IMPLANT
DRAPE SURG 17X23 STRL (DRAPES) ×3 IMPLANT
DRAPE SURG ORHT 6 SPLT 77X108 (DRAPES) ×1 IMPLANT
DRAPE U-SHAPE 47X51 STRL (DRAPES) ×6 IMPLANT
DRSG AQUACEL AG ADV 3.5X10 (GAUZE/BANDAGES/DRESSINGS) IMPLANT
DRSG AQUACEL AG ADV 3.5X14 (GAUZE/BANDAGES/DRESSINGS) ×3 IMPLANT
DURAPREP 26ML APPLICATOR (WOUND CARE) ×3 IMPLANT
ELECT BLADE 4.0 EZ CLEAN MEGAD (MISCELLANEOUS) ×3
ELECT CAUTERY BLADE 6.4 (BLADE) IMPLANT
ELECT PENCIL ROCKER SW 15FT (MISCELLANEOUS) ×3 IMPLANT
ELECT REM PT RETURN 9FT ADLT (ELECTROSURGICAL) ×3
ELECTRODE BLDE 4.0 EZ CLN MEGD (MISCELLANEOUS) ×1 IMPLANT
ELECTRODE REM PT RTRN 9FT ADLT (ELECTROSURGICAL) ×1 IMPLANT
GAUZE SPONGE 4X4 16PLY XRAY LF (GAUZE/BANDAGES/DRESSINGS) IMPLANT
GLOVE BIO SURGEON STRL SZ 6.5 (GLOVE) ×2 IMPLANT
GLOVE BIO SURGEONS STRL SZ 6.5 (GLOVE) ×1
GLOVE BIOGEL PI IND STRL 6.5 (GLOVE) ×1 IMPLANT
GLOVE BIOGEL PI IND STRL 8.5 (GLOVE) ×1 IMPLANT
GLOVE BIOGEL PI INDICATOR 6.5 (GLOVE) ×2
GLOVE BIOGEL PI INDICATOR 8.5 (GLOVE) ×2
GLOVE SS BIOGEL STRL SZ 8.5 (GLOVE) ×1 IMPLANT
GLOVE SUPERSENSE BIOGEL SZ 8.5 (GLOVE) ×2
GOWN STRL REUS W/ TWL LRG LVL3 (GOWN DISPOSABLE) ×1 IMPLANT
GOWN STRL REUS W/TWL 2XL LVL3 (GOWN DISPOSABLE) ×6 IMPLANT
GOWN STRL REUS W/TWL LRG LVL3 (GOWN DISPOSABLE) ×2
KIT BASIN OR (CUSTOM PROCEDURE TRAY) ×3 IMPLANT
KIT DILATOR XLIF 5 (KITS) ×1 IMPLANT
KIT ROOM TURNOVER OR (KITS) ×3 IMPLANT
KIT SURGICAL ACCESS MAXCESS 4 (KITS) ×3 IMPLANT
KIT XLIF (KITS) ×2
MODULE NVM5 NEXT GEN EMG (NEEDLE) ×3 IMPLANT
NEEDLE I-PASS III (NEEDLE) IMPLANT
NEEDLE SPNL 18GX3.5 QUINCKE PK (NEEDLE) ×3 IMPLANT
NS IRRIG 1000ML POUR BTL (IV SOLUTION) ×3 IMPLANT
PACK LAMINECTOMY ORTHO (CUSTOM PROCEDURE TRAY) ×3 IMPLANT
PACK UNIVERSAL I (CUSTOM PROCEDURE TRAY) ×3 IMPLANT
PAD ARMBOARD 7.5X6 YLW CONV (MISCELLANEOUS) ×9 IMPLANT
PUTTY BONE DBX 5CC MIX (Putty) ×3 IMPLANT
SPONGE INTESTINAL PEANUT (DISPOSABLE) IMPLANT
SPONGE LAP 4X18 X RAY DECT (DISPOSABLE) ×6 IMPLANT
SPONGE SURGIFOAM ABS GEL 100 (HEMOSTASIS) IMPLANT
STRIP CLOSURE SKIN 1/2X4 (GAUZE/BANDAGES/DRESSINGS) IMPLANT
SURGIFLO W/THROMBIN 8M KIT (HEMOSTASIS) ×3 IMPLANT
SUT BONE WAX W31G (SUTURE) ×3 IMPLANT
SUT MON AB 3-0 SH 27 (SUTURE) ×4
SUT MON AB 3-0 SH27 (SUTURE) ×2 IMPLANT
SUT PROLENE 5 0 C 1 24 (SUTURE) IMPLANT
SUT SILK 2 0 TIES 10X30 (SUTURE) IMPLANT
SUT SILK 3 0 TIES 10X30 (SUTURE) IMPLANT
SUT VIC AB 1 CT1 18XCR BRD 8 (SUTURE) ×1 IMPLANT
SUT VIC AB 1 CT1 27 (SUTURE)
SUT VIC AB 1 CT1 27XBRD ANBCTR (SUTURE) IMPLANT
SUT VIC AB 1 CT1 8-18 (SUTURE) ×2
SUT VIC AB 1 CTX 36 (SUTURE)
SUT VIC AB 1 CTX36XBRD ANBCTR (SUTURE) IMPLANT
SUT VIC AB 2-0 CT1 18 (SUTURE) ×6 IMPLANT
SYR BULB IRRIGATION 50ML (SYRINGE) ×3 IMPLANT
TOWEL OR 17X24 6PK STRL BLUE (TOWEL DISPOSABLE) ×6 IMPLANT
TOWEL OR 17X26 10 PK STRL BLUE (TOWEL DISPOSABLE) ×3 IMPLANT
TRAY FOLEY CATH 16FR SILVER (SET/KITS/TRAYS/PACK) ×3 IMPLANT
WATER STERILE IRR 1000ML POUR (IV SOLUTION) IMPLANT

## 2016-03-01 NOTE — Brief Op Note (Signed)
03/01/2016  11:23 AM  PATIENT:  Gregory Guerrero  70 y.o. male  PRE-OPERATIVE DIAGNOSIS:  LUMBER SPINAL STENOSIS WITH SCOLIOSIS  POST-OPERATIVE DIAGNOSIS:  LUMBER SPINAL STENOSIS WITH SCOLIOSIS  PROCEDURE:  Procedure(s): EXTREME LATERAL INTERBODY FUSION  LUMBAR 2-4 (N/A)  SURGEON:  Surgeon(s) and Role:    * [name], MD - Primary  PHYSICIAN ASSISTANT:   ASSISTANTS: Carmen Mayo   ANESTHESIA:   general  EBL:  Total I/O In: 1300 [I.V.:1300] Out: 350 [Urine:250; Blood:100]  BLOOD ADMINISTERED:none  DRAINS: none   LOCAL MEDICATIONS USED:  NONE  SPECIMEN:  No Specimen  DISPOSITION OF SPECIMEN:  N/A  COUNTS:  YES  TOURNIQUET:  * No tourniquets in log *  DICTATION: .Other Dictation: Dictation Number [id number]  PLAN OF CARE: Admit to inpatient   PATIENT DISPOSITION:  PACU - hemodynamically stable.

## 2016-03-01 NOTE — Anesthesia Preprocedure Evaluation (Addendum)
Anesthesia Evaluation  Patient identified by MRN, date of birth, ID band Patient awake    Reviewed: Allergy & Precautions, NPO status , Patient's Chart, lab work & pertinent test results  History of Anesthesia Complications Negative for: history of anesthetic complications  Airway Mallampati: I  TM Distance: >3 FB Neck ROM: Full    Dental  (+) Edentulous Upper, Dental Advisory Given   Pulmonary former smoker,    breath sounds clear to auscultation       Cardiovascular hypertension, Pt. on medications + CAD and + Past MI   Rhythm:Regular Rate:Normal     Neuro/Psych negative psych ROS   GI/Hepatic negative GI ROS, Neg liver ROS,   Endo/Other  diabetesMorbid obesity  Renal/GU      Musculoskeletal   Abdominal   Peds  Hematology   Anesthesia Other Findings   Reproductive/Obstetrics                           Anesthesia Physical Anesthesia Plan  ASA: III  Anesthesia Plan: General   Post-op Pain Management:    Induction: Intravenous  Airway Management Planned: Oral ETT  Additional Equipment:   Intra-op Plan:   Post-operative Plan: Extubation in OR  Informed Consent: I have reviewed the patients History and Physical, chart, labs and discussed the procedure including the risks, benefits and alternatives for the proposed anesthesia with the patient or authorized representative who has indicated his/her understanding and acceptance.     Plan Discussed with: CRNA  Anesthesia Plan Comments:         Anesthesia Quick Evaluation

## 2016-03-01 NOTE — Transfer of Care (Signed)
Immediate Anesthesia Transfer of Care Note  Patient: Gregory Guerrero  Procedure(s) Performed: Procedure(s): EXTREME LATERAL INTERBODY FUSION  LUMBAR 2-4 (N/A)  Patient Location: PACU  Anesthesia Type:General  Level of Consciousness: awake, oriented and patient cooperative  Airway & Oxygen Therapy: Patient Spontanous Breathing and Patient connected to face mask oxygen  Post-op Assessment: Report given to RN and Post -op Vital signs reviewed and stable  Post vital signs: Reviewed  Last Vitals:  Vitals:   03/01/16 0647  BP: (!) 161/79  Pulse: 84  Resp: 18  Temp: 37.4 C    Last Pain:  Vitals:   03/01/16 0647  TempSrc: Oral  PainSc: 3       Patients Stated Pain Goal: 3 (78/93/81 0175)  Complications: No apparent anesthesia complications

## 2016-03-01 NOTE — H&P (Signed)
History of Present Illness The patient is a 70 year old male who comes in today for a preoperative History and Physical. The patient is scheduled for a XLIF L2-4 and day 2 PSFI L2-S1, Decompression at L2-S1 to be performed by Dr. Duane Lope D. [name], MD at [facility] on 03/01/16 and 03/02/16 . Please see the hospital record for complete dictated history and physical. The pt reports placement of a stent 11 years ago.   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 Diabetes Mellitus  Father. Heart Disease  Father.  Social History  Tobacco / smoke exposure  10/23/2013: no Tobacco use  Former smoker. 10/23/2013: smoke(d) 1 pack(s) per day Number of flights of stairs before winded  greater than 5 Not under pain contract  No history of drug/alcohol rehab  Marital status  married Current drinker  10/23/2013: Currently drinks wine only occasionally per week Exercise  Exercises daily; does running / walking Living situation  live with spouse Children  2 Current work status  retired  Medication History Simvastatin (40MG  Tablet, Oral) Active. (qd) Losartan Potassium-HCTZ (100-25MG  Tablet, Oral) Active. (qd) One-A-Day Mens (1 (one) Oral) Active. (qd) Vitamin C (1000MG  Tablet, 1 (one) Oral) Active. (qd) Aspirin (325MG  Tablet, 1 (one) Oral) Active. (qd) AmLODIPine Besylate (10MG  Tablet, Oral) Active. (qd) Aleve (220MG  Tablet, Oral) Active. Medications Reconciled  Vitals  02/22/2016 1:19 PM Weight: 240 lb  Height: 69in Weight was reported by patient. Body Surface Area: 2.23 m Body Mass Index: 35.44 kg/m  Temp.: 98.69F  Pulse: 75 (Regular)  BP: 142/95 (Sitting, Right Arm, Standard)  General General Appearance-Not in acute distress. Orientation-Oriented X3. Build & Nutrition-Well nourished and Well developed.  Integumentary General Characteristics Surgical Scars - no surgical scar evidence of previous lumbar surgery. Lumbar Spine-Skin examination of the lumbar spine is without deformity, skin lesions, lacerations or abrasions.  Chest and Lung Exam Auscultation Breath sounds - Normal and Clear.  Cardiovascular Auscultation Rhythm - Regular rate and rhythm.  Abdomen Palpation/Percussion Palpation and Percussion of the abdomen reveal - Soft, Non Tender and No Rebound tenderness.  Peripheral Vascular Lower Extremity Palpation - Posterior tibial pulse - Bilateral - 2+. Dorsalis pedis pulse - Bilateral - 2+.  Neurologic Sensation Lower Extremity - Left - sensation is intact in the lower extremity. Right - sensation is diminished in the lower extremity. Reflexes Patellar Reflex - Bilateral - 2+. Achilles Reflex - Bilateral - 2+. Clonus - Bilateral - clonus not present. Hoffman's Sign - Bilateral - Hoffman's sign not present. Testing Seated Straight Leg Raise - Bilateral - Seated straight leg raise negative.  Musculoskeletal Spine/Ribs/Pelvis  Lumbosacral Spine: Inspection and Palpation - Tenderness - left lumbar paraspinals tender to palpation and right lumbar paraspinals tender to palpation. Strength and Tone: Strength - Hip Flexion - Bilateral - 5/5. Knee Extension - Bilateral - 5/5. Knee Flexion - Bilateral - 5/5. Ankle Dorsiflexion - Bilateral - 5/5. Ankle Plantarflexion - Bilateral - 5/5. Heel walk - Bilateral - unable to heel walk. Toe Walk - Bilateral - unable to walk on toes. Heel-Toe Walk - Bilateral - unable to heel-toe walk. ROM - Flexion -  moderately  decreased range of motion and painful. Extension - moderately decreased range of motion and painful. Left Lateral Bending - moderately decreased range of motion and painful. Right Lateral Bending - moderately decreased range of motion and painful. Right Rotation - moderately decreased range of motion and painful. Left Rotation - moderately decreased range of motion and painful. Pain - . Lumbosacral Spine - Waddell's Signs - no Waddell's signs present. Lower Extremity Range of Motion - No true hip, knee or ankle pain with range of motion. Gait and Station - [employer] - no assistive devices.  IMAGING MRI from 12/31/2015 shows moderate to severe disc space loss and degenerative scoliosis 2-3, 3-4, significant foraminal compromise 2-3, 3-4. At 4-5, there is disc space loss, degenerative disc disease, stenosis. At L5-S1, there is a grade I, borderline II slip at L5-S1. No significant disc material is left. Bilateral pars defects. No spinal canal stenosis, but severe bilateral neural foraminal narrowing due to the pars defects.  Assessment & Plan  Posterior decompression/Fusion:Risks of surgery include infection, bleeding, nerve damage, death, stroke, paralysis, failure to heal, need for further surgery, ongoing or worse pain, need for further surgery, CSF leak, loss of bowel or bladder, and recurrent disc herniation or stenosis which would necessitate need for further surgery. Non-union, hardware failure, adjacent segment disease and recurrent pain. Hardware breakage, mal-position requiring surgery to correct or remove.   Goal Of Surgery: Discussed that goal of surgery is to reduce pain and improve function and quality of life. Patient is aware that despite all appropriate treatment that there pain and function could be the same, worse, or different.  At this point in time, the patient has scoliosis and degenerative spondylolisthesis along with severe spinal stenosis. To address the scoliosis, I  think doing a lateral interbody fusion at 2-3 and 3-4 would allow me to correct the curve and indirectly addresses spinal stenosis at 2-3 and 3-4. However, I would then have to do a posterior-based decompression to address the severe foraminal stenosis at L5-S1 and the central stenosis at 4-5. Given the instability that he has at L5-S1, I would probably extend the fusion from L2 down to S1. I would deal this as a stage procedure given his age and the extent of the surgery. We have talked about this in great detail. We have reviewed the risks which include infection, bleeding, nerve damage, death, stroke, paralysis, injury to the lumbar plexus which is a group of nerves that power your hip flexors, which could result in difficulty walking, ongoing or worsening pain, need for further surgery, nonunion meaning it does not fuse. All of his and his wife questions were addressed. He feels as though this is very overwhelming and I agree with him.

## 2016-03-01 NOTE — Anesthesia Procedure Notes (Signed)
Procedure Name: Intubation Date/Time: 03/01/2016 8:43 AM Performed by: Jenne Campus Pre-anesthesia Checklist: Patient identified, Emergency Drugs available, Suction available and Patient being monitored Patient Re-evaluated:Patient Re-evaluated prior to inductionOxygen Delivery Method: Circle System Utilized Preoxygenation: Pre-oxygenation with 100% oxygen Intubation Type: IV induction Ventilation: Mask ventilation without difficulty Laryngoscope Size: Miller and 3 Grade View: Grade I Tube type: Oral Tube size: 7.5 mm Number of attempts: 1 Airway Equipment and Method: Stylet and Oral airway Placement Confirmation: ETT inserted through vocal cords under direct vision,  positive ETCO2 and breath sounds checked- equal and bilateral Secured at: 22 cm Tube secured with: Tape Dental Injury: Teeth and Oropharynx as per pre-operative assessment

## 2016-03-02 ENCOUNTER — Inpatient Hospital Stay (HOSPITAL_COMMUNITY): Payer: Medicare Other | Admitting: Certified Registered Nurse Anesthetist

## 2016-03-02 ENCOUNTER — Encounter (HOSPITAL_COMMUNITY)
Admission: RE | Disposition: A | Discharge status: Home Health | Payer: Self-pay | Source: Ambulatory Visit | Attending: Orthopedic Surgery

## 2016-03-02 ENCOUNTER — Encounter (HOSPITAL_COMMUNITY): Payer: Self-pay | Admitting: Certified Registered Nurse Anesthetist

## 2016-03-02 ENCOUNTER — Inpatient Hospital Stay (HOSPITAL_COMMUNITY): Admission: RE | Admit: 2016-03-02 | Payer: Medicare Other | Source: Ambulatory Visit | Admitting: Orthopedic Surgery

## 2016-03-02 ENCOUNTER — Inpatient Hospital Stay (HOSPITAL_COMMUNITY): Payer: Medicare Other

## 2016-03-02 HISTORY — PX: SPINAL FUSION: SHX223

## 2016-03-02 SURGERY — POSTERIOR LUMBAR FUSION 4 LEVEL
Anesthesia: General

## 2016-03-02 SURGERY — FUSION, SPINE, 2 OR MORE LEVELS, POSTERIOR APPROACH
Anesthesia: General | Site: Spine Lumbar

## 2016-03-02 MED ORDER — SUCCINYLCHOLINE CHLORIDE 20 MG/ML IJ SOLN
INTRAMUSCULAR | Status: DC | PRN
  Administered 2016-03-02: 140 mg via INTRAVENOUS

## 2016-03-02 MED ORDER — METOPROLOL TARTRATE 5 MG/5ML IV SOLN
INTRAVENOUS | Status: AC
  Filled 2016-03-02: qty 5

## 2016-03-02 MED ORDER — ONDANSETRON HCL 4 MG/2ML IJ SOLN
INTRAMUSCULAR | Status: AC
  Filled 2016-03-02: qty 2

## 2016-03-02 MED ORDER — EPHEDRINE 5 MG/ML INJ
INTRAVENOUS | Status: AC
  Filled 2016-03-02: qty 10

## 2016-03-02 MED ORDER — EPHEDRINE SULFATE 50 MG/ML IJ SOLN
INTRAMUSCULAR | Status: DC | PRN
  Administered 2016-03-02: 15 mg via INTRAVENOUS

## 2016-03-02 MED ORDER — LIDOCAINE HCL (CARDIAC) 20 MG/ML IV SOLN
INTRAVENOUS | Status: DC | PRN
  Administered 2016-03-02: 100 mg via INTRATRACHEAL

## 2016-03-02 MED ORDER — ACETAMINOPHEN 10 MG/ML IV SOLN
INTRAVENOUS | Status: AC
  Filled 2016-03-02: qty 100

## 2016-03-02 MED ORDER — OXYCODONE HCL 5 MG PO TABS
10.0000 mg | ORAL_TABLET | ORAL | Status: DC | PRN
  Administered 2016-03-03 – 2016-03-07 (×18): 10 mg via ORAL
  Filled 2016-03-02 (×19): qty 2

## 2016-03-02 MED ORDER — SODIUM CHLORIDE 0.9 % IV SOLN
250.0000 mL | INTRAVENOUS | Status: DC
  Administered 2016-03-04: 250 mL via INTRAVENOUS

## 2016-03-02 MED ORDER — PROPOFOL 10 MG/ML IV BOLUS
INTRAVENOUS | Status: AC
  Filled 2016-03-02: qty 20

## 2016-03-02 MED ORDER — ALBUTEROL SULFATE HFA 108 (90 BASE) MCG/ACT IN AERS
INHALATION_SPRAY | RESPIRATORY_TRACT | Status: DC | PRN
  Administered 2016-03-02: 2 via RESPIRATORY_TRACT

## 2016-03-02 MED ORDER — HYDROMORPHONE HCL 1 MG/ML IJ SOLN
INTRAMUSCULAR | Status: AC
  Filled 2016-03-02: qty 0.5

## 2016-03-02 MED ORDER — HEMOSTATIC AGENTS (NO CHARGE) OPTIME
TOPICAL | Status: DC | PRN
  Administered 2016-03-02: 1 via TOPICAL

## 2016-03-02 MED ORDER — MEPERIDINE HCL 25 MG/ML IJ SOLN: 6.2500 mg | INTRAMUSCULAR | Status: DC | PRN

## 2016-03-02 MED ORDER — HYDROMORPHONE HCL 1 MG/ML IJ SOLN
0.2500 mg | INTRAMUSCULAR | Status: DC | PRN
  Administered 2016-03-02 (×2): 0.5 mg via INTRAVENOUS

## 2016-03-02 MED ORDER — FENTANYL CITRATE (PF) 100 MCG/2ML IJ SOLN
INTRAMUSCULAR | Status: AC
  Filled 2016-03-02: qty 4

## 2016-03-02 MED ORDER — GLYCOPYRROLATE 0.2 MG/ML IV SOSY
PREFILLED_SYRINGE | INTRAVENOUS | Status: AC
  Filled 2016-03-02: qty 3

## 2016-03-02 MED ORDER — PROPOFOL 10 MG/ML IV BOLUS
INTRAVENOUS | Status: DC | PRN
  Administered 2016-03-02: 150 mg via INTRAVENOUS
  Administered 2016-03-02: 50 mg via INTRAVENOUS

## 2016-03-02 MED ORDER — LIDOCAINE-EPINEPHRINE (PF) 1 %-1:200000 IJ SOLN
INTRAMUSCULAR | Status: DC | PRN
  Administered 2016-03-02: 10 mL

## 2016-03-02 MED ORDER — MIDAZOLAM HCL 2 MG/2ML IJ SOLN
INTRAMUSCULAR | Status: DC | PRN
  Administered 2016-03-02 (×2): 1 mg via INTRAVENOUS

## 2016-03-02 MED ORDER — LIDOCAINE 2% (20 MG/ML) 5 ML SYRINGE
INTRAMUSCULAR | Status: AC
  Filled 2016-03-02: qty 5

## 2016-03-02 MED ORDER — PHENYLEPHRINE 40 MCG/ML (10ML) SYRINGE FOR IV PUSH (FOR BLOOD PRESSURE SUPPORT)
PREFILLED_SYRINGE | INTRAVENOUS | Status: AC
  Filled 2016-03-02: qty 10

## 2016-03-02 MED ORDER — FENTANYL CITRATE (PF) 100 MCG/2ML IJ SOLN
INTRAMUSCULAR | Status: DC | PRN
  Administered 2016-03-02: 200 ug via INTRAVENOUS

## 2016-03-02 MED ORDER — MENTHOL 3 MG MT LOZG: 1.0000 | LOZENGE | OROMUCOSAL | Status: DC | PRN

## 2016-03-02 MED ORDER — PROPOFOL 500 MG/50ML IV EMUL
INTRAVENOUS | Status: DC | PRN
  Administered 2016-03-02: 09:00:00 via INTRAVENOUS
  Administered 2016-03-02: 75 ug/kg/min via INTRAVENOUS
  Administered 2016-03-02: 12:00:00 via INTRAVENOUS

## 2016-03-02 MED ORDER — HYDROMORPHONE HCL 1 MG/ML IJ SOLN
0.5000 mg | INTRAMUSCULAR | Status: DC | PRN
  Administered 2016-03-02 – 2016-03-04 (×5): 1 mg via INTRAVENOUS
  Filled 2016-03-02 (×5): qty 1

## 2016-03-02 MED ORDER — ACETAMINOPHEN 10 MG/ML IV SOLN
INTRAVENOUS | Status: DC | PRN
Start: 2016-03-02 — End: 2016-03-02
  Administered 2016-03-02: 1000 mg via INTRAVENOUS

## 2016-03-02 MED ORDER — SODIUM CHLORIDE 0.9% FLUSH: 3.0000 mL | INTRAVENOUS | Status: DC | PRN

## 2016-03-02 MED ORDER — LUBIPROSTONE 24 MCG PO CAPS
24.0000 ug | ORAL_CAPSULE | Freq: Two times a day (BID) | ORAL | Status: DC
  Administered 2016-03-03 – 2016-03-07 (×9): 24 ug via ORAL
  Filled 2016-03-02 (×9): qty 1

## 2016-03-02 MED ORDER — PHENYLEPHRINE HCL 10 MG/ML IJ SOLN
INTRAMUSCULAR | Status: DC | PRN
  Administered 2016-03-02: 120 ug via INTRAVENOUS
  Administered 2016-03-02 (×2): 160 ug via INTRAVENOUS
  Administered 2016-03-02: 200 ug via INTRAVENOUS
  Administered 2016-03-02: 160 ug via INTRAVENOUS

## 2016-03-02 MED ORDER — ARTIFICIAL TEARS OP OINT
TOPICAL_OINTMENT | OPHTHALMIC | Status: DC | PRN
  Administered 2016-03-02: 1 via OPHTHALMIC

## 2016-03-02 MED ORDER — MIDAZOLAM HCL 2 MG/2ML IJ SOLN
INTRAMUSCULAR | Status: AC
  Filled 2016-03-02: qty 2

## 2016-03-02 MED ORDER — PROPOFOL 1000 MG/100ML IV EMUL
INTRAVENOUS | Status: AC
  Filled 2016-03-02: qty 300

## 2016-03-02 MED ORDER — THROMBIN 20000 UNITS EX KIT
PACK | CUTANEOUS | Status: DC | PRN
  Administered 2016-03-02: 20 mL via TOPICAL

## 2016-03-02 MED ORDER — GLYCOPYRROLATE 0.2 MG/ML IJ SOLN
INTRAMUSCULAR | Status: DC | PRN
  Administered 2016-03-02: 0.1 mg via INTRAVENOUS

## 2016-03-02 MED ORDER — ARTIFICIAL TEARS OP OINT
TOPICAL_OINTMENT | OPHTHALMIC | Status: AC
Start: 2016-03-02 — End: 2016-03-02
  Filled 2016-03-02: qty 3.5

## 2016-03-02 MED ORDER — LACTATED RINGERS IV SOLN
INTRAVENOUS | Status: DC
  Administered 2016-03-03: 11:00:00 via INTRAVENOUS

## 2016-03-02 MED ORDER — SUCCINYLCHOLINE CHLORIDE 200 MG/10ML IV SOSY
PREFILLED_SYRINGE | INTRAVENOUS | Status: AC
  Filled 2016-03-02: qty 10

## 2016-03-02 MED ORDER — DEXAMETHASONE SODIUM PHOSPHATE 10 MG/ML IJ SOLN
INTRAMUSCULAR | Status: DC | PRN
  Administered 2016-03-02: 10 mg via INTRAVENOUS

## 2016-03-02 MED ORDER — ONDANSETRON HCL 4 MG/2ML IJ SOLN: 4.0000 mg | Freq: Once | INTRAMUSCULAR | Status: DC | PRN

## 2016-03-02 MED ORDER — ONDANSETRON HCL 4 MG/2ML IJ SOLN
INTRAMUSCULAR | Status: DC | PRN
  Administered 2016-03-02 (×2): 4 mg via INTRAVENOUS

## 2016-03-02 MED ORDER — ALBUTEROL SULFATE HFA 108 (90 BASE) MCG/ACT IN AERS
INHALATION_SPRAY | RESPIRATORY_TRACT | Status: AC
  Filled 2016-03-02: qty 6.7

## 2016-03-02 MED ORDER — PHENOL 1.4 % MT LIQD: 1.0000 | OROMUCOSAL | Status: DC | PRN

## 2016-03-02 MED ORDER — PHENYLEPHRINE HCL 10 MG/ML IJ SOLN
INTRAVENOUS | Status: DC | PRN
  Administered 2016-03-02: 100 ug/min via INTRAVENOUS
  Administered 2016-03-02 (×2): via INTRAVENOUS

## 2016-03-02 MED ORDER — SODIUM CHLORIDE 0.9% FLUSH
3.0000 mL | Freq: Two times a day (BID) | INTRAVENOUS | Status: DC
  Administered 2016-03-03 – 2016-03-07 (×7): 3 mL via INTRAVENOUS

## 2016-03-02 MED ORDER — METOPROLOL TARTARATE 1 MG/ML SYRINGE (5ML)
Status: DC | PRN
  Administered 2016-03-02: 2.5 mg via INTRAVENOUS

## 2016-03-02 MED ORDER — CEFAZOLIN SODIUM-DEXTROSE 2-3 GM-% IV SOLR
INTRAVENOUS | Status: DC | PRN
  Administered 2016-03-02 (×2): 2 g via INTRAVENOUS

## 2016-03-02 MED ORDER — KETAMINE HCL-SODIUM CHLORIDE 100-0.9 MG/10ML-% IV SOSY
PREFILLED_SYRINGE | INTRAVENOUS | Status: AC
  Filled 2016-03-02: qty 10

## 2016-03-02 MED ORDER — METHOCARBAMOL 1000 MG/10ML IJ SOLN
500.0000 mg | Freq: Four times a day (QID) | INTRAVENOUS | Status: DC | PRN
  Filled 2016-03-02: qty 5

## 2016-03-02 MED ORDER — THROMBIN 20000 UNITS EX SOLR
CUTANEOUS | Status: AC
  Filled 2016-03-02: qty 20000

## 2016-03-02 MED ORDER — ALBUMIN HUMAN 5 % IV SOLN
INTRAVENOUS | Status: DC | PRN
  Administered 2016-03-02 (×2): via INTRAVENOUS

## 2016-03-02 MED ORDER — KETAMINE HCL 10 MG/ML IJ SOLN
INTRAMUSCULAR | Status: DC | PRN
  Administered 2016-03-02 (×2): 20 mg via INTRAVENOUS

## 2016-03-02 MED ORDER — CEFAZOLIN IN D5W 1 GM/50ML IV SOLN
1.0000 g | Freq: Three times a day (TID) | INTRAVENOUS | Status: AC
  Administered 2016-03-02 – 2016-03-03 (×2): 1 g via INTRAVENOUS
  Filled 2016-03-02 (×2): qty 50

## 2016-03-02 MED ORDER — METHOCARBAMOL 500 MG PO TABS
500.0000 mg | ORAL_TABLET | Freq: Four times a day (QID) | ORAL | Status: DC | PRN
  Administered 2016-03-03 – 2016-03-07 (×11): 500 mg via ORAL
  Filled 2016-03-02 (×11): qty 1

## 2016-03-02 MED ORDER — ONDANSETRON HCL 4 MG/2ML IJ SOLN
4.0000 mg | INTRAMUSCULAR | Status: DC | PRN
  Administered 2016-03-02 – 2016-03-03 (×3): 4 mg via INTRAVENOUS
  Filled 2016-03-02 (×3): qty 2

## 2016-03-02 MED ORDER — 0.9 % SODIUM CHLORIDE (POUR BTL) OPTIME
TOPICAL | Status: DC | PRN
  Administered 2016-03-02: 1000 mL
  Administered 2016-03-02: 700 mL

## 2016-03-02 MED ORDER — DEXAMETHASONE SODIUM PHOSPHATE 10 MG/ML IJ SOLN
INTRAMUSCULAR | Status: AC
Start: 2016-03-02 — End: 2016-03-02
  Filled 2016-03-02: qty 1

## 2016-03-02 SURGICAL SUPPLY — 88 items
BAG URIMETER 350ML BARDEX IC (UROLOGICAL SUPPLIES) ×1
BAG URIMETER BARDEX IC 350 (UROLOGICAL SUPPLIES) ×2 IMPLANT
BASKET BONE COLLECTION (BASKET) ×3 IMPLANT
BLADE SURG ROTATE 9660 (MISCELLANEOUS) IMPLANT
BUR EGG ELITE 4.0 (BURR) ×2 IMPLANT
BUR EGG ELITE 4.0MM (BURR) ×1
CATH FOLEY 2WAY SLVR  5CC 12FR (CATHETERS) ×2
CATH FOLEY 2WAY SLVR 5CC 12FR (CATHETERS) ×1 IMPLANT
CLIP NEUROVISION LG (CLIP) ×3 IMPLANT
CLOSURE STERI-STRIP 1/2X4 (GAUZE/BANDAGES/DRESSINGS) ×2
CLOSURE WOUND 1/2 X4 (GAUZE/BANDAGES/DRESSINGS) ×1
CLSR STERI-STRIP ANTIMIC 1/2X4 (GAUZE/BANDAGES/DRESSINGS) ×4 IMPLANT
CONNECTOR RELINE 45-65 5.5 (Connector) ×3 IMPLANT
CORDS BIPOLAR (ELECTRODE) ×3 IMPLANT
COVER MAYO STAND STRL (DRAPES) ×3 IMPLANT
COVER SURGICAL LIGHT HANDLE (MISCELLANEOUS) ×3 IMPLANT
DRAIN CHANNEL 10F 3/8 F FF (DRAIN) IMPLANT
DRAIN CHANNEL 10M FLAT 3/4 FLT (DRAIN) IMPLANT
DRAIN CHANNEL 15F RND FF W/TCR (WOUND CARE) ×3 IMPLANT
DRAPE C-ARM 42X72 X-RAY (DRAPES) ×3 IMPLANT
DRAPE INCISE IOBAN 66X45 STRL (DRAPES) ×3 IMPLANT
DRAPE POUCH INSTRU U-SHP 10X18 (DRAPES) ×3 IMPLANT
DRAPE SURG 17X23 STRL (DRAPES) ×3 IMPLANT
DRAPE U-SHAPE 47X51 STRL (DRAPES) ×6 IMPLANT
DRSG AQUACEL AG ADV 3.5X 6 (GAUZE/BANDAGES/DRESSINGS) ×6 IMPLANT
DRSG AQUACEL AG ADV 3.5X10 (GAUZE/BANDAGES/DRESSINGS) ×6 IMPLANT
DRSG MEPILEX BORDER 4X4 (GAUZE/BANDAGES/DRESSINGS) ×3 IMPLANT
DURAPREP 26ML APPLICATOR (WOUND CARE) ×3 IMPLANT
ELECT BLADE 4.0 EZ CLEAN MEGAD (MISCELLANEOUS) ×3
ELECT PENCIL ROCKER SW 15FT (MISCELLANEOUS) ×3 IMPLANT
ELECT REM PT RETURN 9FT ADLT (ELECTROSURGICAL) ×3
ELECTRODE BLDE 4.0 EZ CLN MEGD (MISCELLANEOUS) ×1 IMPLANT
ELECTRODE REM PT RTRN 9FT ADLT (ELECTROSURGICAL) ×1 IMPLANT
EVACUATOR SILICONE 100CC (DRAIN) ×3 IMPLANT
GAUZE SPONGE 4X4 12PLY STRL (GAUZE/BANDAGES/DRESSINGS) ×3 IMPLANT
GLOVE BIO SURGEON STRL SZ 6.5 (GLOVE) ×2 IMPLANT
GLOVE BIO SURGEONS STRL SZ 6.5 (GLOVE) ×1
GLOVE BIOGEL PI IND STRL 6.5 (GLOVE) ×1 IMPLANT
GLOVE BIOGEL PI IND STRL 8.5 (GLOVE) ×1 IMPLANT
GLOVE BIOGEL PI INDICATOR 6.5 (GLOVE) ×2
GLOVE BIOGEL PI INDICATOR 8.5 (GLOVE) ×2
GLOVE SS BIOGEL STRL SZ 8.5 (GLOVE) ×1 IMPLANT
GLOVE SUPERSENSE BIOGEL SZ 8.5 (GLOVE) ×2
GOWN STRL REUS W/ TWL XL LVL3 (GOWN DISPOSABLE) ×2 IMPLANT
GOWN STRL REUS W/TWL 2XL LVL3 (GOWN DISPOSABLE) ×6 IMPLANT
GOWN STRL REUS W/TWL XL LVL3 (GOWN DISPOSABLE) ×4
GRAFT DURAGEN MATRIX 1WX1L (Tissue) ×3 IMPLANT
GUIDEWIRE NITINOL BEVEL TIP (WIRE) ×24 IMPLANT
KIT BASIN OR (CUSTOM PROCEDURE TRAY) ×3 IMPLANT
KIT ROOM TURNOVER OR (KITS) ×3 IMPLANT
MODULE NVM5 NEXT GEN EMG (NEEDLE) ×3 IMPLANT
NEEDLE 22X1 1/2 (OR ONLY) (NEEDLE) IMPLANT
NEEDLE SPNL 18GX3.5 QUINCKE PK (NEEDLE) ×6 IMPLANT
NS IRRIG 1000ML POUR BTL (IV SOLUTION) ×3 IMPLANT
PACK LAMINECTOMY ORTHO (CUSTOM PROCEDURE TRAY) ×3 IMPLANT
PACK UNIVERSAL I (CUSTOM PROCEDURE TRAY) ×3 IMPLANT
PAD ARMBOARD 7.5X6 YLW CONV (MISCELLANEOUS) ×6 IMPLANT
PATTIES SURGICAL .5 X.5 (GAUZE/BANDAGES/DRESSINGS) IMPLANT
PATTIES SURGICAL .5 X1 (DISPOSABLE) ×6 IMPLANT
PROBE BALL TIP NVM5 SNG USE (BALLOONS) ×3 IMPLANT
ROD RELINE MAS LORD 5.5X120MM (Rod) ×6 IMPLANT
SCREW LOCK RELINE 5.5 TULIP (Screw) ×24 IMPLANT
SCREW RELINE MAS POLY 6.5X40MM (Screw) ×6 IMPLANT
SCREW RELINE RED 6.5X45MM POLY (Screw) ×12 IMPLANT
SCREW RELINE RED 6.5X50MM POLY (Screw) ×3 IMPLANT
SCREW RELINE RED POLY 6.5X35MM (Screw) ×3 IMPLANT
SPONGE LAP 18X18 X RAY DECT (DISPOSABLE) ×6 IMPLANT
SPONGE LAP 4X18 X RAY DECT (DISPOSABLE) ×15 IMPLANT
SPONGE SURGIFOAM ABS GEL 100 (HEMOSTASIS) ×3 IMPLANT
STRIP CLOSURE SKIN 1/2X4 (GAUZE/BANDAGES/DRESSINGS) ×2 IMPLANT
SURGIFLO W/THROMBIN 8M KIT (HEMOSTASIS) ×6 IMPLANT
SUT BONE WAX W31G (SUTURE) ×3 IMPLANT
SUT ETHILON 2 0 FS 18 (SUTURE) ×3 IMPLANT
SUT MNCRL AB 3-0 PS2 18 (SUTURE) ×6 IMPLANT
SUT MON AB 3-0 SH 27 (SUTURE) ×2
SUT MON AB 3-0 SH27 (SUTURE) ×1 IMPLANT
SUT VIC AB 1 CTX 36 (SUTURE) ×4
SUT VIC AB 1 CTX36XBRD ANBCTR (SUTURE) ×2 IMPLANT
SUT VIC AB 2-0 CT1 18 (SUTURE) ×9 IMPLANT
SUT VICRYL 0 UR6 27IN ABS (SUTURE) ×3 IMPLANT
SYR BULB IRRIGATION 50ML (SYRINGE) ×3 IMPLANT
SYR CONTROL 10ML LL (SYRINGE) ×3 IMPLANT
SYRINGE IRR TOOMEY STRL 70CC (SYRINGE) ×3 IMPLANT
TOWEL OR 17X24 6PK STRL BLUE (TOWEL DISPOSABLE) ×3 IMPLANT
TOWEL OR 17X26 10 PK STRL BLUE (TOWEL DISPOSABLE) ×3 IMPLANT
TRAY FOLEY CATH 16FRSI W/METER (SET/KITS/TRAYS/PACK) IMPLANT
WATER STERILE IRR 1000ML POUR (IV SOLUTION) IMPLANT
YANKAUER SUCT BULB TIP NO VENT (SUCTIONS) ×3 IMPLANT

## 2016-03-02 NOTE — Op Note (Signed)
NAMEPRATHER, FAILLA NO.:  [id number]  MEDICAL RECORD NO.:  [id number]  LOCATION:  5C08C                        FACILITY:  [facility]  PHYSICIAN:  Samanth Mirkin D. [name], M.D. 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  OPERATIVE PROCEDURE:  Lateral interbody fusion at L2-3, L3-4.  COMPLICATIONS:  None.  Intraoperative neuromonitoring remained normal with no adverse EMG activities noted.  FIRST ASSISTANT:  Ronette Deter, [place].  IMPLANT SYSTEM USED:  NuVasive titanium XLIF cages.  At L2-3, I placed a size 12 x 22 lordotic cage packed with Osteocel plus DBX mix.  At L3-4, I placed a 10 x 22 parallel titanium cage packed with Osteocel.  HISTORY:  This is a very pleasant 70 year old gentleman who is having progressive debilitating back, buttock, and bilateral leg pain.  Imaging studies preoperatively demonstrated degenerative scoliosis of the mid lumbar spine and collapse of the L4-5 disk with severe spinal stenosis as well as a grade 1 borderline isthmic spondylolisthesis with advanced degenerative disk disease and bilateral pars defects and foraminal stenosis at L5-S1.  Attempts at conservative management had failed to alleviate his symptoms and his overall quality of life continued to suffer and become debilitating.  After discussing treatment options, we elected to proceed with surgery.  In order to address the degenerative scoliosis, I felt as though the XLIF at 2-3 and 3-4 would allow correction and excellent indirect decompression to alleviate the spinal stenosis at the 2-3 and 3-4 level.  This was our phase  1 surgery today. Plan will be to return to the OR tomorrow for a posterior based pedicle screw fixation L2-S1 with an open decompression L4-5, L5-S1 and instrumented fusion with posterior lateral arthrodesis.  Because of the significant collapse of the disk space and the anterior listhesis at L5- S1, at this point time, I do not think interbody fixation to be needed. I took a great deal of time explaining this to the patient and his wife and all risks and benefits were explained and consent was obtained.  OPERATIVE NOTE:  The patient was brought to the operating room and placed supine on the operating table.  After successful induction of general anesthesia and endotracheal intubation, TEDS, SCDs, and a Foley were inserted.  He was turned into the left lateral decubitus position (right side down) and an axillary roll was placed.  The left arm was placed into the elevated arm holder and the body was secured with tape directly to the operating table.  I confirmed that we had proper AP images correcting for the rotational deformity caused by scoliosis. Once the patient was secured firmly with tape to the table and properly positioned, we then prepped and draped the lateral side of the body.  A time-out was then taken confirming patient, procedure, and all other pertinent important data.  Once this was completed, I identified the midportion of the L3 vertebral body and marked  it out.  I then made an incision and sharply dissected down to the fascia of the external oblique.  Once I was at this fascial plane, I then made a second smaller incision 1 fingerbreadth posterior to this.  I then dissected bluntly with my finger to the posterior retroperitoneal fascia and then dissected through the retroperitoneal fascia into the retroperitoneum. This allowed me to gently mobilize the retroperitoneal fat and then come up on the undersurface of the external oblique.  I then bluntly dissected through  keeping my dissection in line with the fibers of the external oblique.  Once I was through, I then placed my first distraction pin down into the retroperitoneal fascia and onto the iliopsoas.  Once I confirmed satisfactory position at the level of the 3- 4 disk space, I placed my temporary staged pin into the disk space.  I then stimulated the surface of the iliopsoas to ensure that the lumbar plexus was not being traumatized.  I then advanced the first probe through the iliopsoas down to the disk space.  I circumferentially rotated this while stimulating to confirm I was not traumatizing the lumbar plexus.  Once this was done, I then sequentially dilated up and with each dilating tube, I did stimulate circumferentially.  I then placed a final working retractor into the wound and secured it to the table.  Once I had satisfactory positioning of this, I then stimulated the back portion of it to ensure that the lumbar plexus was not entrapped beneath it or directly being traumatized.  Once this was done, the intervertebral Shim was advanced into the intervertebral disks to maintain positioning of my retractor.  I then positioned the retractors so I had complete exposure of the lateral aspect of the disk space.  At this point, I confirmed satisfactory positioning of the retractor in both planes and then a 10-blade scalpel was used to perform an annulotomy.  I then used a combination of pituitary rongeurs, rasps, and a Cobb elevator to remove all the disk material and release the contralateral annulus.  In addition, there was some large right exostosis on the right side and I used my osteotome to advance across the disk space and to perform a controlled osteoclasis of the large exostosis.  Once this was done, I made sure I had bleeding subchondral bone and then trialed intervertebral spacers.  The size 10 parallel by 22 cage provided the best fit.  At this point, I obtained the actual implant,  inserted the bone graft into it and then [facility] it to the appropriate depth.  I had excellent fixation and good contact with both endplates.  At this point, I then repositioned my retractor at the L2-3 level and using the same technique, docked it to the disk space and locked it in place again taking time to ensure the plexus was not traumatized with each sequential dilatation.  Using the same technique, an annulotomy was performed and I removed the entire disk material and rasped the endplates so I had bleeding subchondral bone.  Again using sequential trials, I finally elected to use the size 12 para-lordotic 22 cage. This was malleted and had excellent position.  Final x-rays were satisfactory.  I then removed the retractors after irrigating and obtaining hemostasis using bipolar electrocautery.  The fascia of the external oblique was then closed with interrupted #1 Vicryl sutures, and the second incision posteriorly was closed in a layered fashion with #1 Vicryl suture, 2-0 Vicryl sutures and 3-0 Monocryl.  The lateral incision was also closed in a layered fashion with a #1 Vicryl, 2-0 Vicryl and a 3-0 Monocryl.  Steri-Strips and a dry dressing were applied and the patient was ultimately extubated and transferred to the PACU without incident.  At the end of the case, all needle and sponge counts were correct.  There were no adverse intraoperative events.  The plan will be to return to the OR tomorrow for completion of his posterior decompression and fusion.     Esha Fincher D. [name], M.D.     DDB/MEDQ  D:  03/01/2016  T:  03/02/2016  Job:  [id number]

## 2016-03-02 NOTE — Anesthesia Procedure Notes (Signed)
Procedures

## 2016-03-02 NOTE — Anesthesia Postprocedure Evaluation (Signed)
Anesthesia Post Note  Patient: Gregory Guerrero  Procedure(s) Performed: Procedure(s) (LRB): POSTERIOR SPINAL FUSION INTERBODY L2-S1, DECOMPRESSION L4-S1 (N/A)  Patient location during evaluation: PACU Anesthesia Type: General Level of consciousness: awake and alert Pain management: pain level controlled Vital Signs Assessment: post-procedure vital signs reviewed and stable Respiratory status: spontaneous breathing, nonlabored ventilation, respiratory function stable and patient connected to nasal cannula oxygen Cardiovascular status: blood pressure returned to baseline and stable Postop Assessment: no signs of nausea or vomiting Anesthetic complications: no    Last Vitals:  Vitals:   03/02/16 1513 03/02/16 1530  BP: 107/78   Pulse: 83 83  Resp: 18 20  Temp: 36.3 C     Last Pain:  Vitals:   03/02/16 0613  TempSrc: Oral  PainSc:                  [name] DAVID

## 2016-03-02 NOTE — Progress Notes (Signed)
    Subjective: Procedure(s) (LRB): POSTERIOR SPINAL FUSION ANTIBODY L2-S1, DECOMPRESSION L2-S1 (N/A) Day of Surgery  Patient reports pain as 3 on 0-10 scale.  Reports decreased leg pain reports incisional back pain   Foley in place  Negative bowel movement Positive flatus Negative chest pain or shortness of breath  Objective: Vital signs in last 24 hours: Temp:  [98.2 F (36.8 C)-99.1 F (37.3 C)] 98.6 F (37 C) (11/16 7366) Pulse Rate:  [60-84] 81 (11/16 0613) Resp:  [10-20] 20 (11/16 0613) BP: (126-146)/(66-86) 131/66 (11/16 0613) SpO2:  [93 %-99 %] 97 % (11/16 0613)  Intake/Output from previous day: 11/15 0701 - 11/16 0700 In: 3130 [P.O.:480; I.V.:2600; IV Piggyback:50] Out: 8159 [Urine:1325; Blood:150]  Labs: No results for input(s): WBC, RBC, HCT, PLT in the last 72 hours. No results for input(s): NA, K, CL, CO2, BUN, CREATININE, GLUCOSE, CALCIUM in the last 72 hours. No results for input(s): LABPT, INR in the last 72 hours.  Physical Exam: Neurologically intact Sensation intact distally Incision: dressing C/D/I Compartment soft  Assessment/Plan: Patient stable  xrays n/a Continue mobilization with physical therapy Continue care  Radicular leg pain improved Plan on L4-S1 decompression to address stenosis and slip.  Also posterior supplemental fixation L2-S1  [name], [place] [phone]

## 2016-03-02 NOTE — Anesthesia Procedure Notes (Signed)
Procedure Name: Intubation Date/Time: 03/02/2016 7:53 AM Performed by: Lillia Abed Pre-anesthesia Checklist: Patient identified, Emergency Drugs available, Suction available and Patient being monitored Patient Re-evaluated:Patient Re-evaluated prior to inductionOxygen Delivery Method: Circle system utilized Preoxygenation: Pre-oxygenation with 100% oxygen Intubation Type: IV induction Ventilation: Mask ventilation without difficulty Laryngoscope Size: Mac and 4 Grade View: Grade I Tube type: Oral Tube size: 7.5 mm Number of attempts: 1 Airway Equipment and Method: Stylet Placement Confirmation: ETT inserted through vocal cords under direct vision,  positive ETCO2 and breath sounds checked- equal and bilateral Secured at: 23 cm Tube secured with: Tape

## 2016-03-02 NOTE — Brief Op Note (Signed)
03/01/2016 - 03/02/2016  2:16 PM  PATIENT:  Gregory Guerrero  70 y.o. male  PRE-OPERATIVE DIAGNOSIS:  spinal fusion  POST-OPERATIVE DIAGNOSIS:  spinal fusion  PROCEDURE:  Procedure(s): POSTERIOR SPINAL FUSION INTERBODY L2-S1, DECOMPRESSION L4-S1 (N/A)  SURGEON:  Surgeon(s) and Role:    * [name], MD - Primary  PHYSICIAN ASSISTANT:   ASSISTANTS: Carmen Mayo   ANESTHESIA:   general  EBL:  Total I/O In: 3500 [I.V.:3000; IV Piggyback:500] Out: 1105 [Urine:630; Blood:475]  BLOOD ADMINISTERED:none  DRAINS: 1 drain in the back   LOCAL MEDICATIONS USED:  MARCAINE     SPECIMEN:  No Specimen  DISPOSITION OF SPECIMEN:  N/A  COUNTS:  YES  TOURNIQUET:  * No tourniquets in log *  DICTATION: .Other Dictation: Dictation Number [id number]  PLAN OF CARE: Admit to inpatient   PATIENT DISPOSITION:  PACU - hemodynamically stable.

## 2016-03-02 NOTE — Anesthesia Postprocedure Evaluation (Signed)
Anesthesia Post Note  Patient: Jes Costales  Procedure(s) Performed: Procedure(s) (LRB): EXTREME LATERAL INTERBODY FUSION  LUMBAR 2-4 (N/A)  Patient location during evaluation: PACU Anesthesia Type: General Level of consciousness: awake and alert Pain management: pain level controlled Vital Signs Assessment: post-procedure vital signs reviewed and stable Respiratory status: spontaneous breathing, nonlabored ventilation, respiratory function stable and patient connected to nasal cannula oxygen Cardiovascular status: blood pressure returned to baseline and stable Postop Assessment: no signs of nausea or vomiting Anesthetic complications: no    Last Vitals:  Vitals:   03/02/16 0130 03/02/16 0613  BP: 139/70 131/66  Pulse: 84 81  Resp: 18 20  Temp: 37.1 C 37 C    Last Pain:  Vitals:   03/02/16 0613  TempSrc: Oral  PainSc:                  Etoile Looman,JAMES TERRILL

## 2016-03-02 NOTE — Anesthesia Preprocedure Evaluation (Signed)
Anesthesia Evaluation  Patient identified by MRN, date of birth, ID band Patient awake    Reviewed: Allergy & Precautions, NPO status , Patient's Chart, lab work & pertinent test results  Airway Mallampati: I  TM Distance: >3 FB Neck ROM: Full    Dental   Pulmonary former smoker,    Pulmonary exam normal        Cardiovascular hypertension, + CAD, + Past MI and + Cardiac Stents  Normal cardiovascular exam     Neuro/Psych    GI/Hepatic   Endo/Other    Renal/GU      Musculoskeletal   Abdominal   Peds  Hematology   Anesthesia Other Findings   Reproductive/Obstetrics                             Anesthesia Physical Anesthesia Plan  ASA: III  Anesthesia Plan: General   Post-op Pain Management:    Induction: Intravenous  Airway Management Planned: Oral ETT  Additional Equipment:   Intra-op Plan:   Post-operative Plan: Extubation in OR  Informed Consent: I have reviewed the patients History and Physical, chart, labs and discussed the procedure including the risks, benefits and alternatives for the proposed anesthesia with the patient or authorized representative who has indicated his/her understanding and acceptance.     Plan Discussed with: CRNA and Surgeon  Anesthesia Plan Comments:         Anesthesia Quick Evaluation

## 2016-03-02 NOTE — Transfer of Care (Signed)
Immediate Anesthesia Transfer of Care Note  Patient: Gregory Guerrero  Procedure(s) Performed: Procedure(s): POSTERIOR SPINAL FUSION INTERBODY L2-S1, DECOMPRESSION L4-S1 (N/A)  Patient Location: PACU  Anesthesia Type:General  Level of Consciousness: awake, alert  and patient cooperative  Airway & Oxygen Therapy: Patient Spontanous Breathing and Patient connected to face mask oxygen  Post-op Assessment: Report given to RN, Post -op Vital signs reviewed and stable and Patient moving all extremities X 4  Post vital signs: Reviewed and stable  Last Vitals:  Vitals:   03/02/16 0130 03/02/16 0613  BP: 139/70 131/66  Pulse: 84 81  Resp: 18 20  Temp: 37.1 C 37 C    Last Pain:  Vitals:   03/02/16 0613  TempSrc: Oral  PainSc:       Patients Stated Pain Goal: 3 (22/48/25 0037)  Complications: No apparent anesthesia complications

## 2016-03-03 ENCOUNTER — Inpatient Hospital Stay (HOSPITAL_COMMUNITY): Payer: Medicare Other

## 2016-03-03 ENCOUNTER — Encounter (HOSPITAL_COMMUNITY): Payer: Self-pay | Admitting: Orthopedic Surgery

## 2016-03-03 DIAGNOSIS — Z9889 Other specified postprocedural states: Secondary | ICD-10-CM

## 2016-03-03 LAB — POCT I-STAT 4, (NA,K, GLUC, HGB,HCT)
Glucose, Bld: 162 mg/dL — ABNORMAL HIGH (ref 65–99)
HCT: 32 % — ABNORMAL LOW (ref 39.0–52.0)
HEMOGLOBIN: 10.9 g/dL — AB (ref 13.0–17.0)
Potassium: 4.4 mmol/L (ref 3.5–5.1)
Sodium: 136 mmol/L (ref 135–145)

## 2016-03-03 LAB — POCT I-STAT EG7
ACID-BASE DEFICIT: 10 mmol/L — AB (ref 0.0–2.0)
Bicarbonate: 16.7 mmol/L — ABNORMAL LOW (ref 20.0–28.0)
CALCIUM ION: 1.12 mmol/L — AB (ref 1.15–1.40)
HEMATOCRIT: 32 % — AB (ref 39.0–52.0)
HEMOGLOBIN: 10.9 g/dL — AB (ref 13.0–17.0)
O2 SAT: 96 %
PH VEN: 7.263 (ref 7.250–7.430)
PO2 VEN: 96 mmHg — AB (ref 32.0–45.0)
POTASSIUM: 4.3 mmol/L (ref 3.5–5.1)
SODIUM: 136 mmol/L (ref 135–145)
TCO2: 18 mmol/L (ref 0–100)
pCO2, Ven: 37 mmHg — ABNORMAL LOW (ref 44.0–60.0)

## 2016-03-03 MED ORDER — CEFAZOLIN IN D5W 1 GM/50ML IV SOLN
1.0000 g | Freq: Three times a day (TID) | INTRAVENOUS | Status: AC
  Administered 2016-03-03 (×2): 1 g via INTRAVENOUS
  Filled 2016-03-03 (×2): qty 50

## 2016-03-03 MED ORDER — MAGNESIUM CITRATE PO SOLN
1.0000 | Freq: Once | ORAL | Status: DC
Start: 2016-03-03 — End: 2016-03-07
  Filled 2016-03-03 (×2): qty 296

## 2016-03-03 MED ORDER — MAGNESIUM CITRATE PO SOLN: 1.0000 | Freq: Once | ORAL | Status: DC

## 2016-03-03 MED ORDER — POLYETHYLENE GLYCOL 3350 17 G PO PACK
17.0000 g | PACK | Freq: Every day | ORAL | Status: DC
  Administered 2016-03-04 – 2016-03-07 (×4): 17 g via ORAL
  Filled 2016-03-03 (×5): qty 1

## 2016-03-03 NOTE — Evaluation (Signed)
Occupational Therapy Evaluation Patient Details Name: Gregory Guerrero MRN: [id number] DOB: 1945/10/20 Today's Date: 03/03/2016    History of Present Illness Pt is a 70 y.o. male s/p POSTERIOR SPINAL FUSION INTERBODY L2-S1, DECOMPRESSION L4-S1, EXTREME LATERAL INTERBODY FUSION  LUMBAR 2-4. No pertinent PMHx in chart.     Clinical Impression   Pt reports he was independent with ADL PTA. Currently pt overall min guard for ADL and functional mobility but did require mod assist for brace management. Began back, safety, and ADL education with pt and wife. Pt planning to d/c home with support from his wife. Pt would benefit from continued skilled OT to address established goals.    Follow Up Recommendations  No OT follow up;Supervision/Assistance - 24 hour (initially)    Equipment Recommendations  None recommended by OT    Recommendations for Other Services       Precautions / Restrictions Precautions Precautions: Fall;Back Precaution Booklet Issued: Yes (comment) Precaution Comments: Educated pt on back precautions. Required Braces or Orthoses: Spinal Brace Spinal Brace: Lumbar corset Restrictions Weight Bearing Restrictions: No      Mobility Bed Mobility Overal bed mobility: Needs Assistance Bed Mobility: Rolling;Sidelying to Sit;Sit to Sidelying Rolling: Min guard Sidelying to sit: Min guard     Sit to sidelying: Min assist General bed mobility comments: Vcs for technique and positioning.  Min assist to elevate BLEs to return to sidelying.  Transfers Overall transfer level: Needs assistance Equipment used: None (RW provided once standing) Transfers: Sit to/from Stand Sit to Stand: Min guard;Min assist         General transfer comment: Vcs for hand placement and positioning. Min assist for stability upon reaching upright positioning    Balance Overall balance assessment: Needs assistance Sitting-balance support: Feet supported Sitting balance-Leahy Scale:  Fair Sitting balance - Comments: able to sit EOb without physical assist   Standing balance support: Single extremity supported Standing balance-Leahy Scale: Poor Standing balance comment: relaince on UE support on RW or HHA to ensure stability, some posterior bias noted                         ADL Overall ADL's : Needs assistance/impaired Eating/Feeding: Set up;Sitting   Grooming: Min guard;Wash/dry hands;Standing           Upper Body Dressing : Moderate assistance;Sitting Upper Body Dressing Details (indicate cue type and reason): to don/doff brace and gown     Toilet Transfer: Min guard;Ambulation;BSC;RW Toilet Transfer Details (indicate cue type and reason): Simulated by sit to stand from EOB with functional mobility. Pt able to stand at toilet to urinate with min guard assist.         Functional mobility during ADLs: Min guard;Rolling walker General ADL Comments: Educated pt on back precautions and log roll technique for bed mobility.     Vision Vision Assessment?: No apparent visual deficits   Perception     Praxis      Pertinent Vitals/Pain Pain Assessment: 0-10 Pain Score: 9  Pain Location: back with movement Pain Descriptors / Indicators: Aching;Sharp Pain Intervention(s): Monitored during session     Hand Dominance Right   Extremity/Trunk Assessment Upper Extremity Assessment Upper Extremity Assessment: Overall WFL for tasks assessed   Lower Extremity Assessment Lower Extremity Assessment: Overall WFL for tasks assessed   Cervical / Trunk Assessment Cervical / Trunk Assessment: Other exceptions Cervical / Trunk Exceptions: s/p spinal sx   Communication Communication Communication: No difficulties   Cognition Arousal/Alertness: Awake/alert  Behavior During Therapy: [facility] for tasks assessed/performed;Anxious Overall Cognitive Status: Within Functional Limits for tasks assessed                     General Comments        Exercises       Shoulder Instructions      Home Living Family/patient expects to be discharged to:: Private residence Living Arrangements: Spouse/significant other Available Help at Discharge: Family Type of Home: House Home Access: Level entry     Home Layout: One level     Bathroom Shower/Tub: Occupational psychologist: Handicapped height     Home Equipment: Environmental consultant - 2 wheels;Cane - single point;Bedside commode          Prior Functioning/Environment Level of Independence: Independent with assistive device(s)        Comments: mobility with a cane        OT Problem List: Impaired balance (sitting and/or standing);Decreased knowledge of use of DME or AE;Decreased knowledge of precautions;Pain   OT Treatment/Interventions: Self-care/ADL training;Energy conservation;DME and/or AE instruction;Therapeutic activities;Patient/family education;Balance training    OT Goals(Current goals can be found in the care plan section) Acute Rehab OT Goals Patient Stated Goal: to go home OT Goal Formulation: With patient/family Time For Goal Achievement: 03/17/16 Potential to Achieve Goals: Good ADL Goals Pt Will Perform Lower Body Bathing: with supervision;sit to/from stand Pt Will Perform Lower Body Dressing: with supervision;sit to/from stand Pt Will Transfer to Toilet: with supervision;ambulating;bedside commode Pt Will Perform Toileting - Clothing Manipulation and hygiene: with supervision;sit to/from stand Pt Will Perform Tub/Shower Transfer: Shower transfer;with supervision;ambulating;3 in 1;rolling walker Additional ADL Goal #1: Pt will independently verbally recall 3/3 back precautions and maintain throughout ADL. Additional ADL Goal #2: Pt will don/doff back brace with set up as precursor to ADL and functional mobility.  OT Frequency: Min 2X/week   Barriers to D/C:            Co-evaluation PT/OT/SLP Co-Evaluation/Treatment: Yes Reason for Co-Treatment: For  patient/therapist safety PT goals addressed during session: Mobility/safety with mobility OT goals addressed during session: ADL's and self-care      End of Session Equipment Utilized During Treatment: Rolling walker;Back brace Nurse Communication: Mobility status  Activity Tolerance: Patient tolerated treatment well Patient left: in bed;with call bell/phone within reach;with family/visitor present   Time: 8546-2703 OT Time Calculation (min): 45 min Charges:  OT General Charges $OT Visit: 1 Procedure OT Evaluation $OT Eval Moderate Complexity: 1 Procedure OT Treatments $Self Care/Home Management : 8-22 mins G-Codes:     Binnie Kand M.S., OTR/L Pager: [phone]  03/03/2016, 4:41 PM

## 2016-03-03 NOTE — Progress Notes (Signed)
OT Cancellation Note  Patient Details Name: Gregory Guerrero MRN: [id number] DOB: 1945-07-28   Cancelled Treatment:    Reason Eval/Treat Not Completed: Patient not medically ready (NEW DVT BIL LE) pt currently without antiguluates and new BIL LE DVT. OT to follow acutely and check back at the next most appropriate time.   Vonita Moss   OTR/L Pager: [phone] Office: [phone] .  03/03/2016, 10:56 AM

## 2016-03-03 NOTE — Progress Notes (Signed)
Patient foley d/c'd 0615. Unable to get OOB d/t dizziness and nausea. Pt had just received pain medication and zofran prior to this in plan of ambulation. Cool cloth placed on forehead, pt continued to encourage drinking and attempt to eat crackers. Will continue to monitor.

## 2016-03-03 NOTE — Progress Notes (Signed)
PT Cancellation Note  Patient Details Name: Gregory Guerrero MRN: [id number] DOB: 06/24/1945   Cancelled Treatment:    Reason Eval/Treat Not Completed: Medical issues which prohibited therapy. Positive for acute deep vein thrombosis involving bilateral peroneal veins, will hold at this time   Duncan Dull 03/03/2016, 10:56 AM  Alben Deeds, PT DPT  [phone]

## 2016-03-03 NOTE — Evaluation (Signed)
Physical Therapy Evaluation Patient Details Name: Gregory Guerrero MRN: [id number] DOB: 1946/01/23 Today's Date: 03/03/2016   History of Present Illness  Pt is a 70 y.o. male s/p POSTERIOR SPINAL FUSION INTERBODY L2-S1, DECOMPRESSION L4-S1, EXTREME LATERAL INTERBODY FUSION  LUMBAR 2-4. No pertinent PMHx in chart.    Clinical Impression  Patient seen for mobility assessment and education s/p spinal surgery. At this time, patient demonstrates deficits in functional mobility as indicated below. Will benefit from continued skilled PT to address deficits and maximize function. Will see as indicated and progress as tolerated.   Spoke with patient, wife, and MD (all present during session) regarding mobility expectations, safety with mobility, precautions and addressed concerns related to mobility with DVT (per MD). Anticipate patient will progress well, currently min guard/min assist at this time.  Spoke with patient regarding mobility expectations when discharged home, do not feel patient will need follow up PT services upon discharge. Will follow acutely to continue education and mobility training.    Follow Up Recommendations Supervision - Intermittent    Equipment Recommendations  None recommended by PT    Recommendations for Other Services       Precautions / Restrictions Precautions Precautions: Fall;Back Precaution Booklet Issued: Yes (comment) Precaution Comments: Educated pt on back precautions. Required Braces or Orthoses: Spinal Brace Spinal Brace: Lumbar corset Restrictions Weight Bearing Restrictions: No      Mobility  Bed Mobility Overal bed mobility: Needs Assistance Bed Mobility: Rolling;Sidelying to Sit;Sit to Sidelying Rolling: Min guard Sidelying to sit: Min guard     Sit to sidelying: Min assist General bed mobility comments: Vcs for technique and positioning.  Min assist to elevate BLEs to return to sidelying.  Transfers Overall transfer level: Needs  assistance Equipment used: None (RW provided once standing) Transfers: Sit to/from Stand Sit to Stand: Min guard;Min assist         General transfer comment: Vcs for hand placement and positioning. Min assist for stability upon reaching upright positioning  Ambulation/Gait Ambulation/Gait assistance: Min guard Ambulation Distance (Feet): 180 Feet Assistive device: Rolling walker (2 wheeled) Gait Pattern/deviations: Step-through pattern;Decreased stride length Gait velocity: decreased Gait velocity interpretation: Below normal speed for age/gender General Gait Details: Vcs for increased cadence and upright posture. Patient with some modest instability during ambulation. Cued for use of RW  Stairs            Wheelchair Mobility    Modified Rankin (Stroke Patients Only)       Balance Overall balance assessment: Needs assistance Sitting-balance support: Feet supported Sitting balance-Leahy Scale: Fair Sitting balance - Comments: able to sit EOb without physical assist   Standing balance support: Single extremity supported Standing balance-Leahy Scale: Poor Standing balance comment: relaince on UE support on RW or HHA to ensure stability, some posterior bias noted             High level balance activites: Direction changes;Turns;Head turns High Level Balance Comments: min guard to min assist for stability during higher level balance tasks with use of RW and cues for precautions             Pertinent Vitals/Pain Pain Assessment: 0-10 Pain Score: 9  Pain Location: back with movement Pain Descriptors / Indicators: Aching;Sharp Pain Intervention(s): Monitored during session    Home Living Family/patient expects to be discharged to:: Private residence Living Arrangements: Spouse/significant other Available Help at Discharge: Family Type of Home: House Home Access: Level entry     [place]: [place]: Environmental consultant -  2 wheels;Cane - single  point;Bedside commode      Prior Function Level of Independence: Independent with assistive device(s)         Comments: mobility with a cane     Hand Dominance   Dominant Hand: Right    Extremity/Trunk Assessment   Upper Extremity Assessment: Overall WFL for tasks assessed           Lower Extremity Assessment: Overall WFL for tasks assessed      Cervical / Trunk Assessment: Other exceptions  Communication   Communication: No difficulties  Cognition Arousal/Alertness: Awake/alert Behavior During Therapy: WFL for tasks assessed/performed;Anxious Overall Cognitive Status: Within Functional Limits for tasks assessed                      General Comments General comments (skin integrity, edema, etc.): spoke extensively with patient and wife regarding mobility concerns, activity expectations, safety and precautions.    Exercises     Assessment/Plan    PT Assessment Patient needs continued PT services  PT Problem List Decreased strength;Decreased activity tolerance;Decreased balance;Decreased mobility;Decreased knowledge of precautions;Pain          PT Treatment Interventions DME instruction;Gait training;Functional mobility training;Therapeutic activities;Therapeutic exercise;Balance training;Patient/family education    PT Goals (Current goals can be found in the Care Plan section)  Acute Rehab PT Goals Patient Stated Goal: to go home PT Goal Formulation: With patient/family Time For Goal Achievement: 03/17/16 Potential to Achieve Goals: Good    Frequency Min 5X/week   Barriers to discharge        Co-evaluation PT/OT/SLP Co-Evaluation/Treatment: Yes Reason for Co-Treatment: For patient/therapist safety PT goals addressed during session: Mobility/safety with mobility OT goals addressed during session: ADL's and self-care       End of Session Equipment Utilized During Treatment: Gait belt;Back brace Activity Tolerance: Patient tolerated  treatment well Patient left: in bed;with call bell/phone within reach;with bed alarm set;with family/visitor present;with SCD's reapplied Nurse Communication: Mobility status         Time: 5809-9833 PT Time Calculation (min) (ACUTE ONLY): 46 min   Charges:   PT Evaluation $PT Eval Moderate Complexity: 1 Procedure     PT G Codes:        Duncan Dull Mar 11, 2016, 4:28 PM Alben Deeds, [place] DPT  [phone]

## 2016-03-03 NOTE — Progress Notes (Addendum)
CSW received referral, SNF placement. CSW met with pt @ bedside to discuss. Pt alert and oriented X4. Pt not interested in SNF,pt reports that he walked with nurse all the way to elevators after first surgery, feels sorer after second surgery however feels his wife can care for him at home. Pt reporting 1 level single family home; no exterior or interior stairs. Pt reports DME in the home; FWW, Cane. Pt ok with HHC. CSW will update CM.  Mordechai Matuszak B. Paulette Blanch [phone]

## 2016-03-03 NOTE — Progress Notes (Signed)
Preliminary results by tech - Venous Duplex Lower Ext. Completed. Positive for acute deep vein thrombosis involving bilateral peroneal veins. All other veins are patent without evidence of thrombus. Results given to patient's nurse [place].  Oda Cogan, BS, RDMS, RVT

## 2016-03-03 NOTE — Progress Notes (Signed)
Nurse notified by RVT that patient is positive for DVT to bilateral peroneal veins. MD notified and ordered to keep patient's ted hose on, apply foot pump instead of SCD's and continue with PT/OT. Order carried out.

## 2016-03-03 NOTE — Consult Note (Signed)
            [facility] CM Primary Care Navigator  03/03/2016  Gregory Guerrero 1945/11/13 [id number]   Seen patient and wife Gregory Guerrero) at the bedside to identify possible discharge needs. Patient verbalized having increased and worsening back pain which limits mobility and "weak feeling to back of legs" that had led to this admission/ surgery.  Patient states currently having no PCP (primary care provider) since they moved from [place], although reports trying to call some offices but were told that they were not accepting new patients.  Patient and wife voiced preference to see PCP in [place] area. List of primary care providers given and encouraged to establish care with one.  Both stated that they will also consult with their other providers to see if they can be referred to one.    Patient states using [facility] in [place] to obtain medications with no difficulty. He reports managing his own medications at home straight out of the bottle containers (since he is not taking that much).   Patient is independent with self care and able to drive prior to this admission. Wife will provide transportation to his doctors' appointments as stated. Wife will also serve as the primary caregiver at home.   Plan for discharge is home according to wife. She mentioned that patient will possibly be going for outpatient rehabilitation after few weeks of recovery.  Patient and wife voiced understanding to establish care with a PCP (after explaining the benefits and importance of having one) and to follow-up with a primary care provider post discharge.  Both denied further needs or concerns at this time.     For additional questions please contact:  Edwena Felty A. Boyd Litaker, BSN, RN-BC [facility] PRIMARY CARE Navigator Cell: [phone]

## 2016-03-03 NOTE — Progress Notes (Signed)
    Subjective: Procedure(s) (LRB): POSTERIOR SPINAL FUSION INTERBODY L2-S1, DECOMPRESSION L4-S1 (N/A) 1 Day Post-Op  Patient reports pain as 3 on 0-10 scale.  Reports decreased leg pain reports incisional back pain   Positive void Negative bowel movement Positive flatus Negative chest pain or shortness of breath  Objective: Vital signs in last 24 hours: Temp:  [97.3 F (36.3 C)-98.9 F (37.2 C)] 98.9 F (37.2 C) (11/17 0629) Pulse Rate:  [59-83] 82 (11/17 0629) Resp:  [11-20] 18 (11/17 0629) BP: (96-129)/(45-78) 122/62 (11/17 0629) SpO2:  [92 %-98 %] 92 % (11/17 0629)  Intake/Output from previous day: 11/16 0701 - 11/17 0700 In: 4050 [I.V.:3500; IV Piggyback:550] Out: 2745 [Urine:1980; Drains:290; Blood:475]  Labs: No results for input(s): WBC, RBC, HCT, PLT in the last 72 hours. No results for input(s): NA, K, CL, CO2, BUN, CREATININE, GLUCOSE, CALCIUM in the last 72 hours. No results for input(s): LABPT, INR in the last 72 hours.  Physical Exam: Neurologically intact ABD soft Intact pulses distally Incision: dressing C/D/I Compartment soft  Assessment/Plan: Patient stable  xrays pending Mobilization Continue drain and abx's Monitor clinical exam Doppler today to rule out DVT Continue mobilization with physical therapy Continue care    [name], MD [place] [phone]

## 2016-03-03 NOTE — Op Note (Signed)
NAMETAYDEN, NICHELSON NO.:  [id number]  MEDICAL RECORD NO.:  [id number]  LOCATION:  5C08C                        FACILITY:  [facility]  PHYSICIAN:  Emerly Prak D. [name], M.D. 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  OPERATIVE PROCEDURE:  Posterior instrumented fusion L2-S1 with posterolateral autograft and L4-5, L5-S1 posterior decompression.  COMPLICATIONS:  None.  CONDITION:  Stable.  IMPLANT SYSTEM USED:  NuVasive pedicle screw system with 45-mm length, 6.5 diameter pedicle screws placed at L2, L3, L4, and a 40-mm length screw at S1.  The L5 screw was omitted because of the spondylolisthesis. A single cross-link was also used at the L5 level.  Intraoperative neuro monitoring remained stable.  No adverse free running EMGs.  All screws tested greater than 30 milliamps.  FIRST ASSISTANT:  Ronette Deter, my PA.  HISTORY:  This is a very pleasant 70 year old gentleman who yesterday underwent an XLIF at L2-3 and L3-4 for degenerative scoliosis with spinal stenosis and spondylolisthesis.  He returns today for planned posterior supplemental fixation.  All appropriate risks, benefits, and alternatives were explained to the patient and consent was obtained.  OPERATIVE NOTE:  The patient was brought to the operating room.  After successful induction of general anesthesia and endotracheal intubation, TEDs, SCDs were applied and he was turned prone onto the Wilson frame. All bony prominences were well padded, and the back was prepped and draped in standard fashion.  Time-out was taken confirming the patient, procedure, and  all other pertinent important data.  Once this was completed, using MIS techniques, I identified the lateral border of the L2 pedicle.  I made a small incision and advanced the Jamshidi needle percutaneously to the lateral aspect of the pedicle.  I confirmed trajectory in the AP and lateral planes and then connected the Jamshidi needle to the neuromonitoring device.  I advanced the needle to the medial border of the pedicle.  Once I was at the medial border of the pedicle on the AP view, I switched to lateral confirming I was beyond the posterior wall of the vertebral body.  I then advanced this into the vertebral body and then placed my cannula.  With the L2 pedicle cannulated, I repeated this exact procedure at L3.  This was also done on the contralateral side.  Once I had the L2 and L3 pedicles percutaneously placed, I then made a midline incision starting at the midportion of the L4 spinous process.  Sharp dissection was carried down to the deep fascia.  I incised the deep fascia and stripped the paraspinal muscles to expose the L4, L5 and S1 spinous processes of the lamina and facet complex.  Once I did this, I used Bovie to remove the facet capsule at 3-4, 4-5 and 5-1.  Hemostasis was obtained.  At this point, I had exposed the posterolateral aspect of the spine.  I then used my Jamshidi needles to place  my L4 and S1 pedicle screws.  Again, I used the same technique confirming in both planes that I had the correct trajectory and that there were no abnormal free running EMGs.  Once all of the pedicles were cannulated, I returned to my decompression.  I excised the posterior elements of L5 and L4 and a portion of that of S1.  I then using my 3-mm Kerrison punch resected the lamina of L5.  I then used a Penfield 4 to develop a plane underneath the very thickened ligamentum flavum.  I excised this and exposed the underlying thecal sac.  I then continued my central decompression  superiorly until I completed the L5 laminectomy.  I then performed a generous L4 lamina central decompression all the way up to the level of the L4 pedicle. Again, once I confirmed that I had an adequate decompression centrally, I then continued my decompression out laterally.  The lateral recesses were completely decompressed and the L5 pars defect was also decompressed.  At this point, I could freely take my [facility] out the L4, L5 and S1 foramen.  I could also pass my Woodson elevator superiorly and underneath the thecal sac bilaterally.  I had excellent posterior decompression, foraminotomy and facetectomy.  At this point, I noted that there was a small little bleb on the central portion of the thecal sac from where I decompressed it.  There was no active CSF leak. I then used my bur to decorticate the facet complex at L4-5 and L5-S1 and I placed bone graft that I had harvested from the posterior decompression.  With the bone graft in place, I then tapped over each of the guide pins and then placed the appropriate size pedicle screws.  All pedicle screws were directly stimulated and there was no abnormal free running EMGs that would make me think there was a breach.  I then measured and placed a 120-mm rod and secured it to each of the screws.  I torqued off each screw according to manufacture's standards. I then irrigated the wound copiously with normal saline and made sure I had completely irrigated.  I placed a deep drain and then I placed a small piece of DuraGen patch over that bleb area.  I then placed a deep drain and then a cross-link was attached.  Once this was completed, I then irrigated copiously with normal saline and then closed the deep fascia with interrupted #1 Vicryl sutures, superficial with 2-0 Vicryl sutures and 3-0 Monocryl for the skin.  I also irrigated out the MIS pedicle screw incisions and closed those in a layered fashion as well. At the end of the  case, all needle and sponge counts were correct.  The patient remained hemodynamically stable.  No adverse intraoperative events.  The patient will be extubated, transferred to the PACU without incident.     Croy Drumwright D. [name], M.D.     DDB/MEDQ  D:  03/02/2016  T:  03/03/2016  Job:  [id number]  cc:   Dr. [name]' office

## 2016-03-03 NOTE — Progress Notes (Signed)
Results of doppler noted No calf pain or swelling Negative hoffman sign Superficial DVT in the calf - no proximal DVT  Risk of PE low given location of clot Risk of bleed and hematoma significant - therefore I am reluctant to anticoagulate for the superficial DVT Will monitor exam Foot pumps and mobilization

## 2016-03-04 MED ORDER — MAGNESIUM CITRATE PO SOLN
1.0000 | Freq: Once | ORAL | Status: AC
  Administered 2016-03-04: 1 via ORAL
  Filled 2016-03-04: qty 296

## 2016-03-04 NOTE — Progress Notes (Signed)
Called on call MD for [facility] name Gregory Guerrero and updated her that patient has not been able to have a bowel movement yet. [name] gave a verbal order for Magnesium Citrate to relieve constipation.

## 2016-03-04 NOTE — Progress Notes (Addendum)
Physical Therapy Treatment Patient Details Name: Gregory Guerrero MRN: [id number] DOB: [birth year] Today's Date: 03/04/2016    History of Present Illness Pt is a 70 y.o. male s/p POSTERIOR SPINAL FUSION INTERBODY L2-S1, DECOMPRESSION L4-S1, EXTREME LATERAL INTERBODY FUSION  LUMBAR 2-4. No pertinent PMHx in chart.      PT Comments    Patient having difficulty with carryover of education related to mobility and precautions.  Continue to recommend f/u HHPT at d/c.  Feel that patient may not be ready for d/c home tomorrow, and would benefit from another day for therapy prior to d/c home.  Will return in am.  Follow Up Recommendations  Home health PT;Supervision for mobility/OOB     Equipment Recommendations  None recommended by PT    Recommendations for Other Services       Precautions / Restrictions Precautions Precautions: Fall;Back Precaution Comments: Reviewed back precautions in relation to functional activities. Required Braces or Orthoses: Spinal Brace Spinal Brace: Lumbar corset;Applied in sitting position Restrictions Weight Bearing Restrictions: No    Mobility  Bed Mobility Overal bed mobility: Needs Assistance Bed Mobility: Sit to Sidelying;Rolling Rolling: Min assist       Sit to sidelying: Min assist General bed mobility comments: Patient sitting EOB with no back brace as PT entered room.  Wife reports patient has been sitting there for 30 minutes.  Encouraged patient to use brace when upright.  Focused on bed mobility.  Repeated step-by-step cues for technique.  Assist to bring LE's onto bed to return to sidelying.  Practiced with no rail and HOB slightly raised.  Transfers                    Ambulation/Gait                 Stairs            Wheelchair Mobility    Modified Rankin (Stroke Patients Only)       Balance                                    Cognition Arousal/Alertness: Awake/alert Behavior During  Therapy: WFL for tasks assessed/performed;Anxious Overall Cognitive Status: Within Functional Limits for tasks assessed (Difficulty following verbal commands and sequencing)                      Exercises      General Comments        Pertinent Vitals/Pain Pain Assessment: 0-10 Pain Score: 7  (with mobility) Pain Location: back Pain Descriptors / Indicators: Grimacing;Aching;Moaning;Sore Pain Intervention(s): Limited activity within patient's tolerance;Monitored during session;Repositioned    Home Living                      Prior Function            PT Goals (current goals can now be found in the care plan section) Acute Rehab PT Goals Patient Stated Goal: to go home Progress towards PT goals: Progressing toward goals    Frequency    Min 5X/week      PT Plan Current plan remains appropriate    Co-evaluation             End of Session   Activity Tolerance: Patient limited by pain Patient left: in bed;with call bell/phone within reach;with bed alarm set;with family/visitor present;with SCD's reapplied     Time: 9562-1308  PT Time Calculation (min) (ACUTE ONLY): 17 min  Charges:  $Therapeutic Activity: 8-22 mins                    G Codes:      Despina Pole 20-Mar-2016, 6:24 PM Carita Pian. Sanjuana Kava, [place] Pager [phone]

## 2016-03-04 NOTE — Progress Notes (Signed)
Occupational Therapy Treatment Patient Details Name: Gregory Guerrero MRN: [id number] DOB: 12-31-1945 Today's Date: 03/04/2016    History of present illness Pt is a 70 y.o. male s/p POSTERIOR SPINAL FUSION INTERBODY L2-S1, DECOMPRESSION L4-S1, EXTREME LATERAL INTERBODY FUSION  LUMBAR 2-4. No pertinent PMHx in chart.     OT comments  Pt making good progress toward OT goals. Pt able to perform toilet transfer with min guard assist and shower transfer with supervision. Requires set up and supervision for donning back brace and mod assist for LB dressing. All back, safety, and ADL education completed with pt and wife. D/c plan remains appropriate. Will continue to follow acutely.   Follow Up Recommendations  No OT follow up;Supervision/Assistance - 24 hour (initially)    Equipment Recommendations  None recommended by OT    Recommendations for Other Services      Precautions / Restrictions Precautions Precautions: Fall;Back Precaution Comments: Reviewed back precautions in relation to functional activities. Required Braces or Orthoses: Spinal Brace Spinal Brace: Lumbar corset;Applied in sitting position Restrictions Weight Bearing Restrictions: No       Mobility Bed Mobility Overal bed mobility: Needs Assistance Bed Mobility: Rolling;Sidelying to Sit;Sit to Sidelying Rolling: Min assist Sidelying to sit: Min assist     Sit to sidelying: Min assist General bed mobility comments: Cues for technique. No use of bed rail and HOB flat.  Transfers Overall transfer level: Needs assistance Equipment used: Rolling walker (2 wheeled) Transfers: Sit to/from Stand Sit to Stand: Min guard         General transfer comment: Cues for hand placement. Pt able to perform sit to stand from EOB x1, BSC x1 without physical assist.    Balance Overall balance assessment: Needs assistance Sitting-balance support: Feet supported;No upper extremity supported Sitting balance-Leahy Scale: Good      Standing balance support: No upper extremity supported;During functional activity Standing balance-Leahy Scale: Fair Standing balance comment: UE support for balance                   ADL Overall ADL's : Needs assistance/impaired     Grooming: Supervision/safety;Standing;Wash/dry face Grooming Details (indicate cue type and reason): Educated on use of 2 cups for oral care.         Upper Body Dressing : Set up;Supervision/safety;Sitting Upper Body Dressing Details (indicate cue type and reason): to don/doff gown and brace Lower Body Dressing: Moderate assistance;Sit to/from stand Lower Body Dressing Details (indicate cue type and reason): Wife to assist as needed. Educated on compensatory strateiges. Wife reports they do have a sock aide at home and are familiar with using it. Toilet Transfer: Min guard;Ambulation;BSC;RW   Toileting- Clothing Manipulation and Hygiene: Supervision/safety Toileting - Clothing Manipulation Details (indicate cue type and reason): Educated pt on proper technique for peri care without twisting and use of wet wipes. Tub/ Shower Transfer: Supervision/safety;Walk-in shower;Ambulation;3 in 1;Rolling walker Tub/Shower Transfer Details (indicate cue type and reason): Educated on supervision for safety with shower transfer upon return home. Functional mobility during ADLs: Min guard;Rolling walker General ADL Comments: Educated pt on maintaining back precautions during functional activities, log roll technique for bed mobility, keeping frequently used items at counter top height.      Vision                     Perception     Praxis      Cognition   Behavior During Therapy: [facility] for tasks assessed/performed;Anxious Overall Cognitive Status: Within Functional Limits for tasks  assessed                       Extremity/Trunk Assessment               Exercises     Shoulder Instructions       General Comments       Pertinent Vitals/ Pain       Pain Assessment: Faces Pain Score: 6  Faces Pain Scale: Hurts even more Pain Location: back Pain Descriptors / Indicators: Sore Pain Intervention(s): Monitored during session;RN gave pain meds during session  Home Living                                          Prior Functioning/Environment              Frequency  Min 2X/week        Progress Toward Goals  OT Goals(current goals can now be found in the care plan section)  Progress towards OT goals: Progressing toward goals  Acute Rehab OT Goals Patient Stated Goal: to go home OT Goal Formulation: With patient/family  Plan Discharge plan remains appropriate    Co-evaluation                 End of Session Equipment Utilized During Treatment: Rolling walker;Back brace   Activity Tolerance Patient tolerated treatment well   Patient Left in bed;with call bell/phone within reach;with bed alarm set;with family/visitor present   Nurse Communication          Time: 1331-1406 OT Time Calculation (min): 35 min  Charges: OT General Charges $OT Visit: 1 Procedure OT Treatments $Self Care/Home Management : 23-37 mins  Binnie Kand M.S., OTR/L Pager: [phone]  03/04/2016, 2:27 PM

## 2016-03-04 NOTE — Progress Notes (Signed)
Physical Therapy Treatment Patient Details Name: Gregory Guerrero MRN: [id number] DOB: [birth year] Today's Date: 03/04/2016    History of Present Illness Pt is a 70 y.o. male s/p POSTERIOR SPINAL FUSION INTERBODY L2-S1, DECOMPRESSION L4-S1, EXTREME LATERAL INTERBODY FUSION  LUMBAR 2-4. No pertinent PMHx in chart.      PT Comments    Patient continues to require assist for all mobility and gait.  Would recommend HHPT f/u at discharge for continued therapy for mobility, gait, balance, and reinforcement of back precautions.  Follow Up Recommendations  Home health PT;Supervision for mobility/OOB     Equipment Recommendations  None recommended by PT    Recommendations for Other Services       Precautions / Restrictions Precautions Precautions: Fall;Back Precaution Comments: Reviewed back precautions and integration into mobility Required Braces or Orthoses: Spinal Brace Spinal Brace: Lumbar corset Restrictions Weight Bearing Restrictions: No    Mobility  Bed Mobility Overal bed mobility: Needs Assistance Bed Mobility: Rolling;Sidelying to Sit;Sit to Sidelying Rolling: Min assist Sidelying to sit: Min assist     Sit to sidelying: Min assist General bed mobility comments: Verbal cues for technique to maintain back precautions.  Practiced with no rail and HOB slightly raised.  Required increased assist without bedrail.  Transfers Overall transfer level: Needs assistance Equipment used: Rolling walker (2 wheeled) Transfers: Sit to/from Stand Sit to Stand: Min assist         General transfer comment: Verbal cues for hand placement and to scoot to edge of bed before standing.  Assist to rise to standing and for balance.  Ambulation/Gait Ambulation/Gait assistance: Min assist Ambulation Distance (Feet): 160 Feet Assistive device: Rolling walker (2 wheeled) Gait Pattern/deviations: Step-through pattern;Decreased stride length Gait velocity: decreased Gait velocity  interpretation: Below normal speed for age/gender General Gait Details: Verbal cues to stand upright.  Patient unsteady during gait, leaning to left.  Assist for balance today.   Stairs            Wheelchair Mobility    Modified Rankin (Stroke Patients Only)       Balance   Sitting-balance support: No upper extremity supported;Feet supported Sitting balance-Leahy Scale: Fair     Standing balance support: Single extremity supported Standing balance-Leahy Scale: Poor Standing balance comment: UE support for balance                    Cognition Arousal/Alertness: Awake/alert Behavior During Therapy: WFL for tasks assessed/performed;Anxious Overall Cognitive Status: Within Functional Limits for tasks assessed                      Exercises      General Comments        Pertinent Vitals/Pain Pain Assessment: 0-10 Pain Score: 6  Pain Location: back, increased with movement Pain Descriptors / Indicators: Aching;Sharp Pain Intervention(s): Monitored during session;Repositioned    Home Living                      Prior Function            PT Goals (current goals can now be found in the care plan section) Acute Rehab PT Goals Patient Stated Goal: to go home Progress towards PT goals: Progressing toward goals    Frequency    Min 5X/week      PT Plan Discharge plan needs to be updated    Co-evaluation             End of Session  Equipment Utilized During Treatment: Gait belt;Back brace Activity Tolerance: Patient tolerated treatment well;Patient limited by pain Patient left: in bed;with call bell/phone within reach;with bed alarm set;with family/visitor present;with SCD's reapplied     Time: 6754-4920 PT Time Calculation (min) (ACUTE ONLY): 36 min  Charges:  $Gait Training: 8-22 mins $Therapeutic Activity: 8-22 mins                    G Codes:      Despina Pole 03/15/16, 10:50 AM Carita Pian. Sanjuana Kava, [place] Pager [phone]

## 2016-03-04 NOTE — Progress Notes (Signed)
Gregory Guerrero  MRN: [id number] DOB/Age: 11-02-1945 70 y.o. Physician: [name] Procedure: Procedure(s) (LRB): POSTERIOR SPINAL FUSION INTERBODY L2-S1, DECOMPRESSION L4-S1 (N/A)     Subjective: Back pain as expected, passing flatus but still no bowel movements.   Vital Signs Temp:  [98.3 F (36.8 C)-99.8 F (37.7 C)] 98.9 F (37.2 C) (11/18 0932) Pulse Rate:  [69-91] 85 (11/18 0932) Resp:  [18-20] 18 (11/18 0932) BP: (100-147)/(59-78) 110/59 (11/18 0932) SpO2:  [93 %-97 %] 96 % (11/18 0932)  Lab Results  Recent Labs  03/02/16 1014 03/02/16 1246  HGB 10.9* 10.9*  HCT 32.0* 32.0*   BMET  Recent Labs  03/02/16 1014 03/02/16 1246  NA 136 136  K 4.4 4.3  GLUCOSE 162*  --    No results found for: INR   Exam Still with 105 out of drain, will leave until tomorrow Moving legs well Belly and abdomen soft to palpation        Plan Mobilize today and work on bowels Goal is home tomorrow Will need drain removed tomorrow  Gregory Stracke PA-C 03/04/2016, 10:02 AM Contact # [phone]

## 2016-03-04 NOTE — Progress Notes (Signed)
Pt. c/o's of chest pain -just back in bed from bathroom voiding and shortly after starting to drink Magnasium Citrate; c/o's chest discomfort and feeling little nervous; just gave Oxycodone for back pain. VS- 129/64 83 100.1(O); O2 sat 89% on RA- had pt. to use ISC and placed on 2L O2 and O2 sat 95%. Paged T. Shuford,PA and informed of pt.'s status; pt. states that pain chest has decreased was 7 now 4. Orders given.

## 2016-03-05 NOTE — Progress Notes (Signed)
Subjective: 3 Days Post-Op Procedure(s) (LRB): POSTERIOR SPINAL FUSION INTERBODY L2-S1, DECOMPRESSION L4-S1 (N/A) Patient reports pain as moderate to back. No bowel or bladder changes. No BM. Limited diet. No numbness or tingling.   Pt and wife report confusion related to the plan. Disscused with them in detail. Denies SOB,CP, or calf pain.  Objective: Vital signs in last 24 hours: Temp:  [98.9 F (37.2 C)-100.1 F (37.8 C)] 99.7 F (37.6 C) (11/19 0908) Pulse Rate:  [53-86] 53 (11/19 0908) Resp:  [18-20] 18 (11/19 0908) BP: (111-151)/(48-71) 111/48 (11/19 0908) SpO2:  [93 %-98 %] 93 % (11/19 0908)  Intake/Output from previous day: 11/18 0701 - 11/19 0700 In: 480 [P.O.:480] Out: 60 [Drains:60] Intake/Output this shift: No intake/output data recorded.   Recent Labs  03/02/16 1014 03/02/16 1246  HGB 10.9* 10.9*    Recent Labs  03/02/16 1014 03/02/16 1246  HCT 32.0* 32.0*    Recent Labs  03/02/16 1014 03/02/16 1246  NA 136 136  K 4.4 4.3  GLUCOSE 162*  --    No results for input(s): LABPT, INR in the last 72 hours.  Alert and oriented x3. RRR, Lungs clear, BS x4. Left Calf soft and non tender. Back dressing C/D/I. No DVT signs. No signs of infection or compartment syndrome. BLE grossly neurovascularly intact.  Drain D/c'ed with tip intact. 24ml drainage. Pt tolerated well. Dressing applied.  Assessment/Plan: 3 Days Post-Op Procedure(s) (LRB): POSTERIOR SPINAL FUSION INTERBODY L2-S1, DECOMPRESSION L4-S1 (N/A) Mobilize Plexi pulse Possible D/c tomorrow. He is concerned about pain management at home. No anticoagulation as the risk out way the benefits Continue current  Care Drain D/c'ed. I spent a good amount of time clarifying the plan with the patient and his wife. He was concerned about the DVT. I explained DVT location and how the risks out weigh the benefits for treatment. Plexi pulses in place. They requested a Doppler study, but I recommend hold off on  that, as the previous study was recent. It is a concern and does need monitoring. I reassured him we will monitor closely. Signs and symptoms were also discussed and to F/u appropriately.  Kerrick Miler L 03/05/2016, 10:09 AM

## 2016-03-05 NOTE — Progress Notes (Signed)
Physical Therapy Treatment Patient Details Name: Gregory Guerrero MRN: [id number] DOB: 30-Aug-1945 Today's Date: 03/05/2016    History of Present Illness Pt is a 70 y.o. male s/p POSTERIOR SPINAL FUSION INTERBODY L2-S1, DECOMPRESSION L4-S1, EXTREME LATERAL INTERBODY FUSION  LUMBAR 2-4. No pertinent PMHx in chart.      PT Comments    Patient making progress with mobility/gait today.  Continue to recommend HHPT at d/c.  Follow Up Recommendations  Home health PT;Supervision for mobility/OOB     Equipment Recommendations  None recommended by PT    Recommendations for Other Services       Precautions / Restrictions Precautions Precautions: Fall;Back Precaution Comments: Reviewed back precautions in relation to functional activities. Required Braces or Orthoses: Spinal Brace Spinal Brace: Lumbar corset;Applied in sitting position Restrictions Weight Bearing Restrictions: No    Mobility  Bed Mobility Overal bed mobility: Needs Assistance Bed Mobility: Rolling;Sidelying to Sit;Sit to Sidelying Rolling: Supervision Sidelying to sit: Min assist     Sit to sidelying: Min assist General bed mobility comments: Patient able to move to EOB with min assist to steady when raising trunk.  Assist to bring LE's onto bed.  Instructed wife on how to assist patient with this.  Transfers Overall transfer level: Needs assistance Equipment used: Rolling walker (2 wheeled) Transfers: Sit to/from Stand Sit to Stand: Min guard         General transfer comment: Assist for safety only.  Wife reports bed at home is high.  Practiced sit > sidelying with elevated bed and no bed rail.  Ambulation/Gait Ambulation/Gait assistance: Min guard Ambulation Distance (Feet): 180 Feet Assistive device: Rolling walker (2 wheeled) Gait Pattern/deviations: Step-through pattern;Decreased stride length Gait velocity: decreased Gait velocity interpretation: Below normal speed for age/gender General Gait  Details: Patient with heavy reliance on UE's on RW during gait.  Cues to stand upright, relax shoulders, and try to decrease UE weightbearing.  Difficulty today due to increased pain level.  More steady with gait today.   Stairs            Wheelchair Mobility    Modified Rankin (Stroke Patients Only)       Balance           Standing balance support: Single extremity supported Standing balance-Leahy Scale: Fair                      Cognition Arousal/Alertness: Awake/alert Behavior During Therapy: WFL for tasks assessed/performed Overall Cognitive Status: Within Functional Limits for tasks assessed                      Exercises      General Comments General comments (skin integrity, edema, etc.): Verbally instructed and demonstrated car transfers with patient/wife.      Pertinent Vitals/Pain Pain Assessment: 0-10 Pain Score: 10-Worst pain ever Pain Location: back Pain Descriptors / Indicators: Aching;Grimacing;Guarding;Sharp;Sore Pain Intervention(s): Limited activity within patient's tolerance;Monitored during session;Repositioned;Patient requesting pain meds-RN notified    Home Living                      Prior Function            PT Goals (current goals can now be found in the care plan section) Acute Rehab PT Goals Patient Stated Goal: to go home Progress towards PT goals: Progressing toward goals    Frequency    Min 5X/week      PT Plan Current plan remains appropriate  Co-evaluation             End of Session Equipment Utilized During Treatment: Gait belt;Back brace Activity Tolerance: Patient limited by pain Patient left: in bed;with call bell/phone within reach;with bed alarm set;with family/visitor present;with SCD's reapplied     Time: 0037-0488 PT Time Calculation (min) (ACUTE ONLY): 34 min  Charges:  $Gait Training: 8-22 mins $Therapeutic Activity: 8-22 mins                    G Codes:       Despina Pole Mar 22, 2016, 3:19 PM Carita Pian. Sanjuana Kava, [place] Pager [phone]

## 2016-03-05 NOTE — Plan of Care (Signed)
Problem: Education: Goal: Knowledge of [facility] Education information/materials will improve Outcome: Progressing POC reviewed with pt.   

## 2016-03-06 MED ORDER — BISACODYL 10 MG RE SUPP
10.0000 mg | Freq: Once | RECTAL | Status: AC
  Administered 2016-03-06: 10 mg via RECTAL
  Filled 2016-03-06: qty 1

## 2016-03-06 MED ORDER — APIXABAN 5 MG PO TABS: 5.0000 mg | ORAL_TABLET | Freq: Two times a day (BID) | ORAL | Status: DC

## 2016-03-06 MED ORDER — MINERAL OIL RE ENEM
1.0000 | ENEMA | Freq: Once | RECTAL | Status: AC
  Administered 2016-03-06: 1 via RECTAL
  Filled 2016-03-06: qty 1

## 2016-03-06 MED ORDER — APIXABAN 5 MG PO TABS
10.0000 mg | ORAL_TABLET | Freq: Two times a day (BID) | ORAL | Status: DC
  Administered 2016-03-06 – 2016-03-07 (×2): 10 mg via ORAL
  Filled 2016-03-06 (×2): qty 2

## 2016-03-06 NOTE — Care Management Important Message (Signed)
Important Message  Patient Details  Name: Gregory Guerrero MRN: [id number] Date of Birth: 1946/04/09   Medicare Important Message Given:  Yes    Alycia Cooperwood 03/06/2016, 11:30 AM

## 2016-03-06 NOTE — Progress Notes (Signed)
ANTICOAGULATION CONSULT NOTE - Initial Consult  Pharmacy Consult for Apixaban Indication: DVT  Allergies  Allergen Reactions  . No Known Allergies     Patient Measurements: Height: 5\' 8"  (172.7 cm) Weight: 241 lb 6.4 oz (109.5 kg) IBW/kg (Calculated) : 68.4   Vital Signs: Temp: 99.5 F (37.5 C) (11/20 1337) Temp Source: Oral (11/20 1337) BP: 111/42 (11/20 1337) Pulse Rate: 84 (11/20 1337)  Labs: No results for input(s): HGB, HCT, PLT, APTT, LABPROT, INR, HEPARINUNFRC, HEPRLOWMOCWT, CREATININE, CKTOTAL, CKMB, TROPONINI in the last 72 hours.  Estimated Creatinine Clearance: 53.4 mL/min (by C-G formula based on SCr of 1.5 mg/dL (H)).   Medical History: Past Medical History:  Diagnosis Date  . CAD (coronary artery disease)   . HTN (hypertension)   . 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 He will begin apixaban with plans to continue x 3 months.   Goal of Therapy:  Monitor platelets by anticoagulation protocol: Yes   Plan:  1) Apixaban 10mg  po bid x 7 days then 5mg  po bid 2) Case management consult for cost 3) Needs education  Deboraha Sprang 03/06/2016,4:42 PM

## 2016-03-06 NOTE — Progress Notes (Signed)
Occupational Therapy Treatment Patient Details Name: Gregory Guerrero MRN: [id number] DOB: 1945-10-11 Today's Date: 03/06/2016    History of present illness Pt is a 70 y.o. male s/p POSTERIOR SPINAL FUSION INTERBODY L2-S1, DECOMPRESSION L4-S1, EXTREME LATERAL INTERBODY FUSION  LUMBAR 2-4. No pertinent PMHx in chart.     OT comments  Pt making progress towards OT goals this session. IN addition to practice of toilet transfer, Pt educated and practiced with AE kit for LB bathing/dressing. Pt and wife with no further questions or concerns and feel they are ready for discharge. Pt able to recall 3/3 Back precautions and maintain during sink level ADL and transfers. Feel Pt is at adequate level for dc and has received all necessary education.    Follow Up Recommendations  No OT follow up;Supervision/Assistance - 24 hour    Equipment Recommendations  None recommended by OT    Recommendations for Other Services      Precautions / Restrictions Precautions Precautions: Fall;Back Precaution Booklet Issued: Yes (comment) Precaution Comments: Pt able to recall 3/3 precautions Required Braces or Orthoses: Spinal Brace Spinal Brace: Lumbar corset;Applied in sitting position Restrictions Weight Bearing Restrictions: No       Mobility Bed Mobility               General bed mobility comments: Pt was sitting on toilet, and went and sat in chair during session, no bed mobility this session, Pt and wife state they feel confident after education with PT yesterday  Transfers Overall transfer level: Needs assistance Equipment used: Rolling walker (2 wheeled) Transfers: Sit to/from Stand Sit to Stand: Supervision         General transfer comment: good hand placement with one verbal cue    Balance Overall balance assessment: Needs assistance Sitting-balance support: No upper extremity supported;Feet supported Sitting balance-Leahy Scale: Good     Standing balance support: No upper  extremity supported;During functional activity Standing balance-Leahy Scale: Fair Standing balance comment: standing at sink for ADL                   ADL Overall ADL's : Needs assistance/impaired Eating/Feeding: Set up;Sitting Eating/Feeding Details (indicate cue type and reason): able to eat half of grilled ham and cheese Grooming: Wash/dry hands;Supervision/safety;Standing Grooming Details (indicate cue type and reason): reviewed cup method for oral care to prevent bending     Lower Body Bathing: Min guard;With adaptive equipment;Sitting/lateral leans Lower Body Bathing Details (indicate cue type and reason): educated on long handle sponge         Toilet Transfer: Min guard;Ambulation;BSC;RW (BSC over toilet) Toilet Transfer Details (indicate cue type and reason): educated on preventing twisting with tongs and not to flush sitting down Toileting- Clothing Manipulation and Hygiene: Supervision/safety;Sit to/from stand   Tub/ Shower Transfer: Supervision/safety;Walk-in shower;Ambulation;3 in 1;Rolling walker Tub/Shower Transfer Details (indicate cue type and reason): Educated on supervision for safety with shower transfer upon return home. Functional mobility during ADLs: Min guard;Rolling walker General ADL Comments: Educated pt on maintaining back precautions during functional activities, log roll technique for bed mobility, keeping frequently used items at counter top height, setting things up on right side to prevent twisting      Vision                     Perception     Praxis      Cognition   Behavior During Therapy: [facility] for tasks assessed/performed;Flat affect Overall Cognitive Status: Within Functional Limits for tasks assessed  Extremity/Trunk Assessment               Exercises     Shoulder Instructions       General Comments      Pertinent Vitals/ Pain       Pain Assessment: 0-10 Pain Score: 7  Pain  Location: back Pain Descriptors / Indicators: Aching;Grimacing;Sore Pain Intervention(s): Limited activity within patient's tolerance;Monitored during session;Repositioned  Home Living                                          Prior Functioning/Environment              Frequency  Min 2X/week        Progress Toward Goals  OT Goals(current goals can now be found in the care plan section)  Progress towards OT goals: Progressing toward goals  Acute Rehab OT Goals Patient Stated Goal: to go home OT Goal Formulation: With patient/family Time For Goal Achievement: 03/17/16 Potential to Achieve Goals: Good  Plan Discharge plan remains appropriate    Co-evaluation                 End of Session Equipment Utilized During Treatment: Rolling walker;Back brace   Activity Tolerance Patient tolerated treatment well   Patient Left in chair;with call bell/phone within reach;with family/visitor present   Nurse Communication Mobility status        Time: 1211-1242 OT Time Calculation (min): 31 min  Charges: OT General Charges $OT Visit: 1 Procedure OT Treatments $Self Care/Home Management : 8-22 mins  Merri Ray Randal Goens 03/06/2016, 2:10 PM Hulda Humphrey OTR/L [phone]

## 2016-03-06 NOTE — Plan of Care (Signed)
Problem: Education: Goal: Knowledge of [facility] Education information/materials will improve Outcome: Progressing POC reviewed with pt.  Problem: Pain Managment: Goal: General experience of comfort will improve Outcome: Progressing Pt. seem to be moving and getting OOB; though states that hips are aching more.

## 2016-03-06 NOTE — Care Management Note (Signed)
Case Management Note  Patient Details  Name: Gregory Guerrero MRN: [id number] Date of Birth: 1945/11/12  Subjective/Objective:  Pt underwent lumbar fusion. He is from home with his spouse.                 Action/Plan: PT recommending [place] services. MD asking for CSW consult for possible SNF placement. CSW informed. CM following for d/c disposition.   Expected Discharge Date:                  Expected Discharge Plan:  [place]  In-House Referral:  Clinical Social Work  Discharge planning Services  CM Consult  Post Acute Care Choice:    Choice offered to:     DME Arranged:    DME Agency:     HH Arranged:    [place] Agency:     Status of Service:  In process, will continue to follow  If discussed at Long Length of Stay Meetings, dates discussed:    Additional Comments:  Pollie Friar, RN 03/06/2016, 1:34 PM

## 2016-03-06 NOTE — Care Management Important Message (Signed)
Important Message  Patient Details  Name: Gregory Guerrero MRN: [id number] Date of Birth: January 08, 1946   Medicare Important Message Given:  Yes    [name] 03/06/2016, 11:31 AM

## 2016-03-06 NOTE — Progress Notes (Signed)
    Subjective: Procedure(s) (LRB): POSTERIOR SPINAL FUSION INTERBODY L2-S1, DECOMPRESSION L4-S1 (N/A) 4 Days Post-Op  Patient reports pain as 4 on 0-10 scale.  Reports decreased leg pain reports incisional back pain   Positive void Negative bowel movement Positive flatus Negative chest pain or shortness of breath  Objective: Vital signs in last 24 hours: Temp:  [98.1 F (36.7 C)-99.4 F (37.4 C)] 98.5 F (36.9 C) (11/20 0942) Pulse Rate:  [72-80] 80 (11/20 0942) Resp:  [16-20] 16 (11/20 0942) BP: (99-142)/(48-79) 99/48 (11/20 0942) SpO2:  [91 %-97 %] 91 % (11/20 0942)  Intake/Output from previous day: 11/19 0701 - 11/20 0700 In: 240 [P.O.:240] Out: 25 [Drains:25]  Labs: No results for input(s): WBC, RBC, HCT, PLT in the last 72 hours. No results for input(s): NA, K, CL, CO2, BUN, CREATININE, GLUCOSE, CALCIUM in the last 72 hours. No results for input(s): LABPT, INR in the last 72 hours.  Physical Exam: Neurologically intact ABD soft Intact pulses distally Incision: dressing C/D/I and no drainage Compartment soft  Assessment/Plan: Patient stable  xrays satisfactory  Continue mobilization with physical therapy Continue care  Advance diet Up with therapy  Patient doing well overall. 1. Enema/mag citrate for constipation 2. PT - recommend HHPT - however also requires assist for mobility.  Patients wife is unable to assist given her age and underlying medical issues.  Therefore - CIR vs SNF. 3. Will discuss need for DVT treatment with vascular surgeon 4. Chest pain resolved - no further episodes.   [name], MD [place] [phone]

## 2016-03-06 NOTE — Progress Notes (Signed)
Physical medicine rehabilitation consult requested chart reviewed. As a latest physical therapy follow-up 03/05/2016 and patient currently 180 feet minimal assist using a rolling walker recommendations made by therapies for home health therapies. Patient will not need inpatient rehabilitation services at this time. Recommendations are discharged to home

## 2016-03-06 NOTE — Progress Notes (Signed)
Physical Therapy Treatment Patient Details Name: Gregory Guerrero MRN: [id number] DOB: December 27, 1945 Today's Date: 03/06/2016    History of Present Illness Pt is a 70 y.o. male s/p POSTERIOR SPINAL FUSION INTERBODY L2-S1, DECOMPRESSION L4-S1, EXTREME LATERAL INTERBODY FUSION  LUMBAR 2-4. No pertinent PMHx in chart.      PT Comments    Patient and wife present and all questions related to mobility answered. They each expressed that they feel comfortable discharging home today.  Follow Up Recommendations  Home health PT;Supervision for mobility/OOB     Equipment Recommendations  None recommended by PT    Recommendations for Other Services       Precautions / Restrictions Precautions Precautions: Fall;Back Precaution Booklet Issued: Yes (comment) Precaution Comments: Pt able to recall 3/3 precautions (wife cued for no lifting) Required Braces or Orthoses: Spinal Brace Spinal Brace: Lumbar corset;Applied in sitting position    Mobility  Bed Mobility               General bed mobility comments: wife reported they are fine with bed mobility, yet concerned he will not have a rail. Discussed options of rail from DME store vs weighted bedside table or chair  Transfers Overall transfer level: Needs assistance Equipment used: Rolling walker (2 wheeled) Transfers: Sit to/from Stand Sit to Stand: Supervision         General transfer comment: vc for looking up to minimize flexion.   Ambulation/Gait Ambulation/Gait assistance: Min guard Ambulation Distance (Feet): 80 Feet Assistive device: Rolling walker (2 wheeled) Gait Pattern/deviations: Step-through pattern;Decreased stride length;Trunk flexed Gait velocity: decreased   General Gait Details: Wife reports pt walked "bent over" for so long that it is hard to stand upright. [place] due to near constant tactile and verbal cues for upright posture. vc for proper use of RW   Stairs            Wheelchair Mobility     Modified Rankin (Stroke Patients Only)       Balance                                    Cognition Arousal/Alertness: Awake/alert Behavior During Therapy: Flat affect Overall Cognitive Status: Within Functional Limits for tasks assessed                      Exercises      General Comments General comments (skin integrity, edema, etc.): Wife present and asked only for PT to review with pt how to walk with RW and stand upright. OT with pt/wife at end of session      Pertinent Vitals/Pain Pain Assessment: 0-10 Pain Score: 7  Pain Location: back Pain Descriptors / Indicators: Aching;Operative site guarding Pain Intervention(s): Limited activity within patient's tolerance;Monitored during session;Repositioned    Home Living                      Prior Function            PT Goals (current goals can now be found in the care plan section) Progress towards PT goals: Progressing toward goals    Frequency           PT Plan Current plan remains appropriate    Co-evaluation             End of Session Equipment Utilized During Treatment: Back brace (pt refused gait belt) Activity Tolerance: Patient tolerated  treatment well Patient left: in chair;with family/visitor present (With OT for further education)     Time: 9758-8325 PT Time Calculation (min) (ACUTE ONLY): 16 min  Charges:  $Gait Training: 8-22 mins                    G CodesJeanie Cooks Gregory Guerrero Apr 02, 2016, 1:29 PM Pager [phone]

## 2016-03-07 MED ORDER — OXYCODONE-ACETAMINOPHEN 10-325 MG PO TABS: 1.0000 | ORAL_TABLET | ORAL | 0 refills | Status: DC | PRN

## 2016-03-07 MED ORDER — APIXABAN 5 MG PO TABS: 5.0000 mg | ORAL_TABLET | Freq: Two times a day (BID) | ORAL | 2 refills | Status: DC

## 2016-03-07 MED ORDER — ONDANSETRON HCL 4 MG PO TABS: 4.0000 mg | ORAL_TABLET | Freq: Three times a day (TID) | ORAL | 0 refills | Status: DC | PRN

## 2016-03-07 MED ORDER — LUBIPROSTONE 24 MCG PO CAPS: 24.0000 ug | ORAL_CAPSULE | Freq: Two times a day (BID) | ORAL | 1 refills | Status: DC

## 2016-03-07 MED ORDER — METHOCARBAMOL 500 MG PO TABS: 500.0000 mg | ORAL_TABLET | Freq: Three times a day (TID) | ORAL | 0 refills | Status: DC | PRN

## 2016-03-07 NOTE — Progress Notes (Signed)
    Subjective: Procedure(s) (LRB): POSTERIOR SPINAL FUSION INTERBODY L2-S1, DECOMPRESSION L4-S1 (N/A) 5 Days Post-Op  Patient reports pain as 3 on 0-10 scale.  Reports decreased leg pain reports incisional back pain   Positive void Negative bowel movement Positive flatus Negative chest pain or shortness of breath  Objective: Vital signs in last 24 hours: Temp:  [98 F (36.7 C)-99.5 F (37.5 C)] 98.8 F (37.1 C) (11/21 0536) Pulse Rate:  [62-102] 69 (11/21 0536) Resp:  [16-20] 20 (11/21 0536) BP: (99-115)/(42-68) 112/68 (11/21 0536) SpO2:  [91 %-99 %] 95 % (11/21 0536)  Intake/Output from previous day: 11/20 0701 - 11/21 0700 In: 240 [P.O.:240] Out: -   Labs: No results for input(s): WBC, RBC, HCT, PLT in the last 72 hours. No results for input(s): NA, K, CL, CO2, BUN, CREATININE, GLUCOSE, CALCIUM in the last 72 hours. No results for input(s): LABPT, INR in the last 72 hours.  Physical Exam: Neurologically intact ABD soft Intact pulses distally Incision: dressing C/D/I Compartment soft  Assessment/Plan: Patient stable  xrays satisfactory Continue mobilization with physical therapy Continue care  Advance diet Up with therapy  Plan on SNF vs CIR discharge. [place] for d/c today  1. Spoke with vascular surgery - recommend 3 months of treatment .  Started apixaban 2. Minimal results with enema/mag citrate.  No abd pain or distention.  Encourage mobilization - will monitor issue 3. Dressing changes today   [name], MD [place] [phone]

## 2016-03-07 NOTE — NC FL2 (Signed)
[place] LEVEL OF CARE SCREENING TOOL     IDENTIFICATION  Patient Name: Gregory Guerrero Birthdate: 12-26-1945 Sex: male Admission Date (Current Location): 03/01/2016  [facility] and [place] Number:  Herbalist and Address:  [facility] [place], [place] [address] [place], [place] [address]      Provider Number:    Attending Physician Name and Address:  [name], MD  Relative Name and Phone Number:       Current Level of Care: Hospital Recommended Level of Care: [place] Prior Approval Number:    Date Approved/Denied:   PASRR Number: [id number]  Discharge Plan: SNF    Current Diagnoses: Patient Active Problem List   Diagnosis Date Noted  . Back pain 03/01/2016  . Diabetes mellitus ([place]) 04/25/2013  . Elevated lipids 04/25/2013  . CAD (coronary artery disease)   . HTN (hypertension)     Orientation RESPIRATION BLADDER Height & Weight     Time, Situation, Place, Self  Normal Continent Weight: 241 lb 6.4 oz (109.5 kg) Height:  5\' 8"  (172.7 cm)  BEHAVIORAL SYMPTOMS/MOOD NEUROLOGICAL BOWEL NUTRITION STATUS      Continent    AMBULATORY STATUS COMMUNICATION OF NEEDS Skin   Limited Assist Verbally Surgical wounds                       Personal Care Assistance Level of Assistance  Bathing, Feeding, Dressing Bathing Assistance: Limited assistance Feeding assistance: Limited assistance Dressing Assistance: Limited assistance     Functional Limitations Info  Hearing, Speech, Sight Sight Info: Adequate Hearing Info: Adequate Speech Info: Adequate    SPECIAL CARE FACTORS FREQUENCY  PT (By licensed PT), OT (By licensed OT)                    Contractures Contractures Info: Not present    Additional Factors Info  Code Status Code Status Info: FULL CODE             Current Medications (03/07/2016):  This is the current hospital active medication list Current Facility-Administered  Medications  Medication Dose Route Frequency Provider Last Rate Last Dose  . 0.9 %  sodium chloride infusion  250 mL Intravenous Continuous [name], MD 1 mL/hr at 03/04/16 0108 250 mL at 03/04/16 0108  . apixaban (ELIQUIS) tablet 10 mg  10 mg Oral BID Otilio Miu, RPH   10 mg at 03/07/16 1035  . [START ON 03/13/2016] apixaban (ELIQUIS) tablet 5 mg  5 mg Oral BID Otilio Miu, RPH      . lactated ringers infusion   Intravenous Continuous [name], MD   Stopped at 03/04/16 [phone]  . lubiprostone (AMITIZA) capsule 24 mcg  24 mcg Oral BID WC [name], MD   24 mcg at 03/07/16 0934  . magnesium citrate solution 1 Bottle  1 Bottle Oral Once [name], MD      . menthol-cetylpyridinium (CEPACOL) lozenge 3 mg  1 lozenge Oral PRN [name], MD       Or  . phenol (CHLORASEPTIC) mouth spray 1 spray  1 spray Mouth/Throat PRN [name], MD      . methocarbamol (ROBAXIN) tablet 500 mg  500 mg Oral Q6H PRN [name], MD   500 mg at 03/07/16 0334  . ondansetron (ZOFRAN) injection 4 mg  4 mg Intravenous Q4H PRN [name], MD   4 mg at 03/03/16 0617  . oxyCODONE (Oxy IR/ROXICODONE) immediate  release tablet 10 mg  10 mg Oral Q4H PRN [name], MD   10 mg at 03/07/16 0334  . polyethylene glycol (MIRALAX / GLYCOLAX) packet 17 g  17 g Oral Daily [name], MD   17 g at 03/07/16 0934  . sodium chloride flush (NS) 0.9 % injection 3 mL  3 mL Intravenous Q12H [name], MD   3 mL at 03/07/16 0935  . sodium chloride flush (NS) 0.9 % injection 3 mL  3 mL Intravenous PRN [name], MD         Discharge Medications: Please see discharge summary for a list of discharge medications.  Relevant Imaging Results:  Relevant Lab Results:   Additional Information SS #: [id number]  Amador Cunas, LCSW

## 2016-03-07 NOTE — Progress Notes (Signed)
CSW met with pt as CIR is not an option and pt interested in SNF. Explained placement process and answered questions. Pt requesting SNF search in [facility] and will discuss dc plan options (HH vs. SNF) with wife before making decision. CSW will follow.   Wandra Feinstein, MSW, LCSW [phone] (coverage)

## 2016-03-07 NOTE — Progress Notes (Signed)
Patient refusing to go to SNF Discussed issues with home discharge All questions addressed  Will set up HHPT and aid Keep f/u next week

## 2016-03-07 NOTE — Care Management Note (Signed)
Case Management Note  Patient Details  Name: Gregory Guerrero MRN: [id number] Date of Birth: [birth year]  Subjective/Objective:                    Action/Plan: CM met with Gregory Guerrero and answered questions about SNF vs Home health. CM also informed him that CIR was not an option at discharge. Pt would like CM to come back and talk with him when his wife arrives. He was in agreement to being faxed out to [facility] in his area. CSW informed. CM will revisit when [name] arrives at the hospital.  Expected Discharge Date:                  Expected Discharge Plan:  [place]  In-House Referral:  Clinical Social Work  Discharge planning Services  CM Consult  Post Acute Care Choice:    Choice offered to:     DME Arranged:    DME Agency:     HH Arranged:    [place] Agency:     Status of Service:  In process, will continue to follow  If discussed at Long Length of Stay Meetings, dates discussed:    Additional Comments:  Pollie Friar, RN 03/07/2016, 10:39 AM

## 2016-03-07 NOTE — Discharge Instructions (Signed)
Information on my medicine - ELIQUIS (apixaban)  This medication education was reviewed with me or my healthcare representative as part of my discharge preparation.  The pharmacist that spoke with me during my hospital stay was:  Brain Hilts, [facility]  Why was Eliquis prescribed for you? Eliquis was prescribed to treat blood clots that may have been found in the veins of your legs (deep vein thrombosis) or in your lungs (pulmonary embolism) and to reduce the risk of them occurring again.  What do You need to know about Eliquis ? The starting dose is 10 mg (two 5 mg tablets) taken TWICE daily for the FIRST SEVEN (7) DAYS, then on Monday 03/13/16  the dose is reduced to ONE 5 mg tablet taken TWICE daily.  Eliquis may be taken with or without food.   Try to take the dose about the same time in the morning and in the evening. If you have difficulty swallowing the tablet whole please discuss with your pharmacist how to take the medication safely.  Take Eliquis exactly as prescribed and DO NOT stop taking Eliquis without talking to the doctor who prescribed the medication.  Stopping may increase your risk of developing a new blood clot.  Refill your prescription before you run out.  After discharge, you should have regular check-up appointments with your healthcare provider that is prescribing your Eliquis.    What do you do if you miss a dose? If a dose of ELIQUIS is not taken at the scheduled time, take it as soon as possible on the same day and twice-daily administration should be resumed. The dose should not be doubled to make up for a missed dose.  Important Safety Information A possible side effect of Eliquis is bleeding. You should call your healthcare provider right away if you experience any of the following: ? Bleeding from an injury or your nose that does not stop. ? Unusual colored urine (red or dark brown) or unusual colored stools (red or black). ? Unusual bruising for  unknown reasons. ? A serious fall or if you hit your head (even if there is no bleeding).  Some medicines may interact with Eliquis and might increase your risk of bleeding or clotting while on Eliquis. To help avoid this, consult your healthcare provider or pharmacist prior to using any new prescription or non-prescription medications, including herbals, vitamins, non-steroidal anti-inflammatory drugs (NSAIDs) and supplements.  This website has more information on Eliquis (apixaban): http://www.eliquis.com/eliquis/home

## 2016-03-07 NOTE — Care Management Note (Signed)
Case Management Note  Patient Details  Name: Gregory Guerrero MRN: [id number] Date of Birth: 02-Sep-1945  Subjective/Objective:                    Action/Plan: Patient and his wife have decided to discharge home with [facility] services. CM met with the [employer] and provided them a list of [employer] agencies. They have used [facility] in the past and would like to use them again. Mary with [facility] notified and accepted the referral. Pt also discharging home on Eliquis. Benefits check submitted revealed a cost of $198/ month. Pt and wife informed and feel the cost will be less. CM provided them the 30 day free card and asked them to talk to their pharmacist about the cost after the first month. Pt also encouraged to use the Eliquis number to ask for assistance or let his cardiologist know to see if they could assist. Pt and wife in agreement. Will update the bedside RN.   Expected Discharge Date:                  Expected Discharge Plan:  [place]  In-House Referral:  Clinical Social Work  Discharge planning Services  CM Consult  Post Acute Care Choice:  Home Health Choice offered to:  Patient, Spouse  DME Arranged:    DME Agency:     HH Arranged:  PT, OT, Nurse's Aide [place] Agency:  [facility] (now [facility])  Status of Service:  Completed, signed off  If discussed at [employer] of Stay Meetings, dates discussed:    Additional Comments:  Gregory Friar, RN 03/07/2016, 12:41 PM

## 2016-03-07 NOTE — Progress Notes (Signed)
Discharge instructions reviewed with patient and spouse. Emphasis on follow up appointment, No bending, lifting and No twisting and also adhering to medication regimen.

## 2016-03-07 NOTE — Discharge Summary (Signed)
Physician Discharge Summary  Patient ID: Gregory Guerrero MRN: [id number] DOB/AGE: 72-08-1945 72 y.o.  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 CAD (coronary artery disease)   . HTN (hypertension)   . MI (myocardial infarction)    1999    Surgeries: Procedure(s): POSTERIOR SPINAL FUSION INTERBODY L2-S1, DECOMPRESSION L4-S1 on 03/01/2016 - 03/02/2016   Consultants (if any):   Discharged Condition: Improved  Hospital Course: Gregory Guerrero is an 70 y.o. male who was admitted 03/01/2016 with a diagnosis of Degenerative Disc Disease and degenerative scoliosis and went to the operating room on 03/01/2016 - 03/02/2016 and underwent the above named procedures.  Pt has been at [facility] 5 days post op.  Pt was diagnosed with post op DVTs involving b/l peroneal veins. Pt also treated for opioid induced constipation.  Pt worked with PT while inpatient.  Inpatient Rehab did not consider him an appropriate candidate.  SNF placement was considered.  Pt and his wife decided to DC home.  He was given perioperative antibiotics:  Anti-infectives    Start     Dose/Rate Route Frequency Ordered Stop   03/03/16 0800  ceFAZolin (ANCEF) IVPB 1 g/50 mL premix     1 g 100 mL/hr over 30 Minutes Intravenous Every 8 hours 03/03/16 0742 03/03/16 1708   03/02/16 1800  ceFAZolin (ANCEF) IVPB 1 g/50 mL premix     1 g 100 mL/hr over 30 Minutes Intravenous Every 8 hours 03/02/16 1714 03/03/16 0209   03/01/16 2000  ceFAZolin (ANCEF) IVPB 1 g/50 mL premix  Status:  Discontinued     1 g 100 mL/hr over 30 Minutes Intravenous Every 8 hours 03/01/16 1853 03/02/16 1714   03/01/16 0600  ceFAZolin (ANCEF) IVPB 2g/100 mL premix     2 g 200 mL/hr over 30 Minutes Intravenous To Short Stay 02/29/16 1024 03/01/16 0855    .  He was given sequential  compression devices, early ambulation, and TED for DVT prophylaxis.  He benefited maximally from the hospital stay and there were no complications.    Recent vital signs:  Vitals:   03/07/16 0536 03/07/16 0953  BP: 112/68 (!) 131/58  Pulse: 69 65  Resp: 20 18  Temp: 98.8 F (37.1 C) 98.3 F (36.8 C)    Recent laboratory studies:  Lab Results  Component Value Date   HGB 10.9 (L) 03/02/2016   HGB 10.9 (L) 03/02/2016   HGB 13.7 02/22/2016   Lab Results  Component Value Date   WBC 9.4 02/22/2016   PLT 163 02/22/2016   No results found for: INR Lab Results  Component Value Date   NA 136 03/02/2016   K 4.3 03/02/2016   CL 109 02/22/2016   CO2 24 02/22/2016   BUN 32 (H) 02/22/2016   CREATININE 1.50 (H) 02/22/2016   GLUCOSE 162 (H) 03/02/2016    Discharge Medications:     Medication List    STOP taking these medications   aspirin 325 MG EC tablet   naproxen sodium 220 MG tablet Commonly known as:  ANAPROX     TAKE these medications   amLODipine 10 MG tablet Commonly known as:  NORVASC Take 10 mg by mouth daily.   apixaban 5 MG Tabs tablet Commonly known as:  ELIQUIS Take 1 tablet (5 mg total) by mouth 2 (two) times daily. Start this  dose in 1 week.  Must complete the 10 mg twice a day dose before starting this dose Start taking on:  03/13/2016   losartan-hydrochlorothiazide 100-25 MG tablet Commonly known as:  HYZAAR Take 1 tablet by mouth daily.   lubiprostone 24 MCG capsule Commonly known as:  AMITIZA Take 1 capsule (24 mcg total) by mouth 2 (two) times daily with a meal.   methocarbamol 500 MG tablet Commonly known as:  ROBAXIN Take 1 tablet (500 mg total) by mouth 3 (three) times daily as needed for muscle spasms.   multivitamin with minerals Tabs tablet Take 1 tablet by mouth daily.   ondansetron 4 MG tablet Commonly known as:  ZOFRAN Take 1 tablet (4 mg total) by mouth every 8 (eight) hours as needed for nausea or vomiting.    oxyCODONE-acetaminophen 10-325 MG tablet Commonly known as:  PERCOCET Take 1 tablet by mouth every 4 (four) hours as needed for pain.   simvastatin 40 MG tablet Commonly known as:  ZOCOR Take 1 tablet (40 mg total) by mouth daily.   vitamin C 1000 MG tablet Take 1,000 mg by mouth daily.       Diagnostic Studies: Dg Lumbar Spine 2-3 Views  Result Date: 03/03/2016 CLINICAL DATA:  Recent lumbar surgeries. EXAM: LUMBAR SPINE - 2-3 VIEW COMPARISON:  03/02/2016 spot images. FINDINGS: PLIF at L2-L3-L4-S1 common no pedicle screws at the L5 level. Cross link age at the L5 level noted. Radiodense interbody spacer cages at L2-3 and L3-4. 10 mm retrolisthesis at L4-5 and 13 mm anterolisthesis at L5-S1. Ill-defined L5 facets, difficult to exclude the possibility of pars defects at L5. Reduced disc height and endplate sclerosis at Z6-6 and L5-S1. Small amount of gas in the soft tissues of the back, not inconsistent with having had surgery in the last several days. A drainage catheter is present. I do not see an unexpected foreign body. Aortoiliac atherosclerotic vascular disease. Spurring along the left sacroiliac joint. IMPRESSION: 1. Postoperative findings with posterolateral rod and pedicle screw fixation at L2-L3-L4-S1, and a cross leakage at L5 but no pedicle screws at L5. Extensive facet arthropathy at L4-5 and L5-S1 with the subluxation at L4-5 and L5-S1 as noted above, I can't exclude pars defects at L5. Surgical drain is in place. Electronically Signed   By: Van Clines M.D.   On: 03/03/2016 10:20   Dg Lumbar Spine 2-3 Views  Result Date: 03/02/2016 CLINICAL DATA:  Lumbar spinal fusion. EXAM: LUMBAR SPINE - 2-3 VIEW; DG C-ARM GT 120 MIN FLUOROSCOPY TIME:  C-arm fluoroscopic images were obtained intraoperatively and submitted for post operative interpretation. Please see the performing provider's procedural report for the fluoroscopy time utilized. COMPARISON:  03/01/2016 FINDINGS: Four  intraoperative spot fluoroscopic images of the lumbar spine are provided. Interbody cages are again seen at L2-3 and L3-4. Interval posterior fusion has been performed from L2-S1. Bilateral pedicle screws and interconnecting rods are present at L2, L3, L4, and S1. Grade 1 anterolisthesis is again noted of L5 on S1. There is also evidence of grade 1 retrolisthesis of L4 on L5. IMPRESSION: Intraoperative images during L2-S1 posterior fusion. Electronically Signed   By: Logan Bores M.D.   On: 03/02/2016 15:24   Dg Lumbar Spine 2-3 Views  Result Date: 03/01/2016 CLINICAL DATA:  Lumbar fusions EXAM: LUMBAR SPINE - 2-3 VIEW; DG C-ARM GT 120 MIN COMPARISON:  None. FLUOROSCOPY TIME:  3 minutes 48 seconds; 3 acquired images FINDINGS: Frontal and lateral images were obtained. There are disc spacers at  L2-3 and L3-4. There is no fracture. There is grade I/IV anterolisthesis of L5 on S1. No other spondylolisthesis. There is marked disc space narrowing at L4-5 L5-S1. There are anterior osteophytes at all visualized levels. IMPRESSION: Postoperative disc spacer placement at L2-3 and L3-4. Multilevel arthropathy. Spondylolisthesis at L5-S1. No fracture. Electronically Signed   By: Lowella Grip III M.D.   On: 03/01/2016 11:29   Dg C-arm Gt 120 Min  Result Date: 03/02/2016 CLINICAL DATA:  Lumbar spinal fusion. EXAM: LUMBAR SPINE - 2-3 VIEW; DG C-ARM GT 120 MIN FLUOROSCOPY TIME:  C-arm fluoroscopic images were obtained intraoperatively and submitted for post operative interpretation. Please see the performing provider's procedural report for the fluoroscopy time utilized. COMPARISON:  03/01/2016 FINDINGS: Four intraoperative spot fluoroscopic images of the lumbar spine are provided. Interbody cages are again seen at L2-3 and L3-4. Interval posterior fusion has been performed from L2-S1. Bilateral pedicle screws and interconnecting rods are present at L2, L3, L4, and S1. Grade 1 anterolisthesis is again noted of L5 on  S1. There is also evidence of grade 1 retrolisthesis of L4 on L5. IMPRESSION: Intraoperative images during L2-S1 posterior fusion. Electronically Signed   By: Logan Bores M.D.   On: 03/02/2016 15:24   Dg C-arm Gt 120 Min  Result Date: 03/01/2016 CLINICAL DATA:  Lumbar fusions EXAM: LUMBAR SPINE - 2-3 VIEW; DG C-ARM GT 120 MIN COMPARISON:  None. FLUOROSCOPY TIME:  3 minutes 48 seconds; 3 acquired images FINDINGS: Frontal and lateral images were obtained. There are disc spacers at L2-3 and L3-4. There is no fracture. There is grade I/IV anterolisthesis of L5 on S1. No other spondylolisthesis. There is marked disc space narrowing at L4-5 L5-S1. There are anterior osteophytes at all visualized levels. IMPRESSION: Postoperative disc spacer placement at L2-3 and L3-4. Multilevel arthropathy. Spondylolisthesis at L5-S1. No fracture. Electronically Signed   By: Lowella Grip III M.D.   On: 03/01/2016 11:29    Disposition: Final discharge disposition not confirmed Pt will DC home with home health Medications provided for post op pain Pt will report to clinic in 2 weeks   Follow-up Information    BROOKS,DAHARI D, MD. Schedule an appointment as soon as possible for a visit in 2 week(s).   Specialty:  Orthopedic Surgery Contact information: [address] [place] [address] [phone]            Signed: Valinda Hoar 03/07/2016, 1:54 PM

## 2016-03-08 DIAGNOSIS — Z4782 Encounter for orthopedic aftercare following scoliosis surgery: Secondary | ICD-10-CM | POA: Diagnosis not present

## 2016-03-08 DIAGNOSIS — M4317 Spondylolisthesis, lumbosacral region: Secondary | ICD-10-CM | POA: Diagnosis not present

## 2016-03-08 DIAGNOSIS — Z4789 Encounter for other orthopedic aftercare: Secondary | ICD-10-CM | POA: Diagnosis not present

## 2016-03-08 DIAGNOSIS — M5442 Lumbago with sciatica, left side: Secondary | ICD-10-CM | POA: Diagnosis not present

## 2016-03-08 DIAGNOSIS — M4156 Other secondary scoliosis, lumbar region: Secondary | ICD-10-CM | POA: Diagnosis not present

## 2016-03-08 DIAGNOSIS — M48061 Spinal stenosis, lumbar region without neurogenic claudication: Secondary | ICD-10-CM | POA: Diagnosis not present

## 2016-03-10 DIAGNOSIS — Z4789 Encounter for other orthopedic aftercare: Secondary | ICD-10-CM | POA: Diagnosis not present

## 2016-03-10 DIAGNOSIS — M48061 Spinal stenosis, lumbar region without neurogenic claudication: Secondary | ICD-10-CM | POA: Diagnosis not present

## 2016-03-10 DIAGNOSIS — Z4782 Encounter for orthopedic aftercare following scoliosis surgery: Secondary | ICD-10-CM | POA: Diagnosis not present

## 2016-03-10 DIAGNOSIS — M4317 Spondylolisthesis, lumbosacral region: Secondary | ICD-10-CM | POA: Diagnosis not present

## 2016-03-10 DIAGNOSIS — M5442 Lumbago with sciatica, left side: Secondary | ICD-10-CM | POA: Diagnosis not present

## 2016-03-10 DIAGNOSIS — M4156 Other secondary scoliosis, lumbar region: Secondary | ICD-10-CM | POA: Diagnosis not present

## 2016-03-13 DIAGNOSIS — M5442 Lumbago with sciatica, left side: Secondary | ICD-10-CM | POA: Diagnosis not present

## 2016-03-13 DIAGNOSIS — M48061 Spinal stenosis, lumbar region without neurogenic claudication: Secondary | ICD-10-CM | POA: Diagnosis not present

## 2016-03-13 DIAGNOSIS — Z4789 Encounter for other orthopedic aftercare: Secondary | ICD-10-CM | POA: Diagnosis not present

## 2016-03-13 DIAGNOSIS — M4317 Spondylolisthesis, lumbosacral region: Secondary | ICD-10-CM | POA: Diagnosis not present

## 2016-03-13 DIAGNOSIS — Z4782 Encounter for orthopedic aftercare following scoliosis surgery: Secondary | ICD-10-CM | POA: Diagnosis not present

## 2016-03-13 DIAGNOSIS — M4156 Other secondary scoliosis, lumbar region: Secondary | ICD-10-CM | POA: Diagnosis not present

## 2016-03-14 DIAGNOSIS — Z9889 Other specified postprocedural states: Secondary | ICD-10-CM | POA: Diagnosis not present

## 2016-03-14 DIAGNOSIS — Z4789 Encounter for other orthopedic aftercare: Secondary | ICD-10-CM | POA: Diagnosis not present

## 2016-03-15 DIAGNOSIS — M48061 Spinal stenosis, lumbar region without neurogenic claudication: Secondary | ICD-10-CM | POA: Diagnosis not present

## 2016-03-15 DIAGNOSIS — Z4782 Encounter for orthopedic aftercare following scoliosis surgery: Secondary | ICD-10-CM | POA: Diagnosis not present

## 2016-03-15 DIAGNOSIS — M5442 Lumbago with sciatica, left side: Secondary | ICD-10-CM | POA: Diagnosis not present

## 2016-03-15 DIAGNOSIS — Z4789 Encounter for other orthopedic aftercare: Secondary | ICD-10-CM | POA: Diagnosis not present

## 2016-03-15 DIAGNOSIS — M4156 Other secondary scoliosis, lumbar region: Secondary | ICD-10-CM | POA: Diagnosis not present

## 2016-03-15 DIAGNOSIS — M4317 Spondylolisthesis, lumbosacral region: Secondary | ICD-10-CM | POA: Diagnosis not present

## 2016-03-16 ENCOUNTER — Telehealth: Payer: Self-pay | Admitting: Cardiovascular Disease

## 2016-03-16 NOTE — Telephone Encounter (Signed)
New Message  Pt call requesting to speak with RN about conversation from today 11/30. Please call back to discuss

## 2016-03-16 NOTE — Telephone Encounter (Signed)
Mrs.[place]et is calling about discontinuing or keeping certain medication .Marland Kitchen Please call

## 2016-03-16 NOTE — Telephone Encounter (Signed)
Called wife back (DPR). She wanted to know if it was okay for patient to start back on his Hyzaar. Informed patient's wife that hyzaar is to be continued from discharged from looking at discharge summary. Informed patient to keep a watch on patient's BP with taking percocet, that this could cause patient's BP to drop also. Patient verbalized understanding.

## 2016-03-16 NOTE — Telephone Encounter (Signed)
Patient's wife (DPR) is concerned about her husband having some blood in her husband's urine. Patient was recently discharged from the hospital, and patient had a foley cath at the hospital. Patient's wife stated his urine had a brown tint to it today, and they did not see any blood in his urine today. Encouraged patient's wife to call his urologist, who is seeing patient on Monday for advisement. Informed patient's wife if patient continued to have blood in his urine and if his BP is extremely low to go to urgent care or ED. Patient's wife verbalized understanding.

## 2016-03-17 DIAGNOSIS — M48061 Spinal stenosis, lumbar region without neurogenic claudication: Secondary | ICD-10-CM | POA: Diagnosis not present

## 2016-03-17 DIAGNOSIS — M5442 Lumbago with sciatica, left side: Secondary | ICD-10-CM | POA: Diagnosis not present

## 2016-03-17 DIAGNOSIS — M4156 Other secondary scoliosis, lumbar region: Secondary | ICD-10-CM | POA: Diagnosis not present

## 2016-03-17 DIAGNOSIS — M4317 Spondylolisthesis, lumbosacral region: Secondary | ICD-10-CM | POA: Diagnosis not present

## 2016-03-17 DIAGNOSIS — Z4782 Encounter for orthopedic aftercare following scoliosis surgery: Secondary | ICD-10-CM | POA: Diagnosis not present

## 2016-03-17 DIAGNOSIS — Z4789 Encounter for other orthopedic aftercare: Secondary | ICD-10-CM | POA: Diagnosis not present

## 2016-03-20 DIAGNOSIS — R350 Frequency of micturition: Secondary | ICD-10-CM | POA: Diagnosis not present

## 2016-03-20 DIAGNOSIS — R310 Gross hematuria: Secondary | ICD-10-CM | POA: Diagnosis not present

## 2016-03-20 DIAGNOSIS — N401 Enlarged prostate with lower urinary tract symptoms: Secondary | ICD-10-CM | POA: Diagnosis not present

## 2016-03-20 DIAGNOSIS — R3912 Poor urinary stream: Secondary | ICD-10-CM | POA: Diagnosis not present

## 2016-03-20 DIAGNOSIS — R351 Nocturia: Secondary | ICD-10-CM | POA: Diagnosis not present

## 2016-03-21 DIAGNOSIS — Z4782 Encounter for orthopedic aftercare following scoliosis surgery: Secondary | ICD-10-CM | POA: Diagnosis not present

## 2016-03-21 DIAGNOSIS — M4156 Other secondary scoliosis, lumbar region: Secondary | ICD-10-CM | POA: Diagnosis not present

## 2016-03-21 DIAGNOSIS — Z4789 Encounter for other orthopedic aftercare: Secondary | ICD-10-CM | POA: Diagnosis not present

## 2016-03-21 DIAGNOSIS — M5442 Lumbago with sciatica, left side: Secondary | ICD-10-CM | POA: Diagnosis not present

## 2016-03-21 DIAGNOSIS — M4317 Spondylolisthesis, lumbosacral region: Secondary | ICD-10-CM | POA: Diagnosis not present

## 2016-03-21 DIAGNOSIS — M48061 Spinal stenosis, lumbar region without neurogenic claudication: Secondary | ICD-10-CM | POA: Diagnosis not present

## 2016-03-23 DIAGNOSIS — Z4782 Encounter for orthopedic aftercare following scoliosis surgery: Secondary | ICD-10-CM | POA: Diagnosis not present

## 2016-03-23 DIAGNOSIS — M5442 Lumbago with sciatica, left side: Secondary | ICD-10-CM | POA: Diagnosis not present

## 2016-03-23 DIAGNOSIS — Z4789 Encounter for other orthopedic aftercare: Secondary | ICD-10-CM | POA: Diagnosis not present

## 2016-03-23 DIAGNOSIS — M4317 Spondylolisthesis, lumbosacral region: Secondary | ICD-10-CM | POA: Diagnosis not present

## 2016-03-23 DIAGNOSIS — M48061 Spinal stenosis, lumbar region without neurogenic claudication: Secondary | ICD-10-CM | POA: Diagnosis not present

## 2016-03-23 DIAGNOSIS — M4156 Other secondary scoliosis, lumbar region: Secondary | ICD-10-CM | POA: Diagnosis not present

## 2016-03-24 DIAGNOSIS — M4317 Spondylolisthesis, lumbosacral region: Secondary | ICD-10-CM | POA: Diagnosis not present

## 2016-03-24 DIAGNOSIS — Z4789 Encounter for other orthopedic aftercare: Secondary | ICD-10-CM | POA: Diagnosis not present

## 2016-03-24 DIAGNOSIS — M5442 Lumbago with sciatica, left side: Secondary | ICD-10-CM | POA: Diagnosis not present

## 2016-03-24 DIAGNOSIS — M4156 Other secondary scoliosis, lumbar region: Secondary | ICD-10-CM | POA: Diagnosis not present

## 2016-03-24 DIAGNOSIS — Z4782 Encounter for orthopedic aftercare following scoliosis surgery: Secondary | ICD-10-CM | POA: Diagnosis not present

## 2016-03-24 DIAGNOSIS — M48061 Spinal stenosis, lumbar region without neurogenic claudication: Secondary | ICD-10-CM | POA: Diagnosis not present

## 2016-03-27 ENCOUNTER — Other Ambulatory Visit: Payer: Self-pay | Admitting: Cardiovascular Disease

## 2016-03-28 DIAGNOSIS — Z9889 Other specified postprocedural states: Secondary | ICD-10-CM | POA: Diagnosis not present

## 2016-03-28 DIAGNOSIS — Z4789 Encounter for other orthopedic aftercare: Secondary | ICD-10-CM | POA: Diagnosis not present

## 2016-04-11 DIAGNOSIS — M48061 Spinal stenosis, lumbar region without neurogenic claudication: Secondary | ICD-10-CM | POA: Diagnosis not present

## 2016-04-11 DIAGNOSIS — Z9889 Other specified postprocedural states: Secondary | ICD-10-CM | POA: Diagnosis not present

## 2016-04-19 ENCOUNTER — Other Ambulatory Visit: Payer: Self-pay

## 2016-04-19 ENCOUNTER — Other Ambulatory Visit: Payer: Self-pay | Admitting: *Deleted

## 2016-04-19 DIAGNOSIS — I2510 Atherosclerotic heart disease of native coronary artery without angina pectoris: Secondary | ICD-10-CM

## 2016-04-19 DIAGNOSIS — I10 Essential (primary) hypertension: Secondary | ICD-10-CM

## 2016-04-19 MED ORDER — LOSARTAN POTASSIUM-HCTZ 100-25 MG PO TABS: 1.0000 | ORAL_TABLET | Freq: Every day | ORAL | 3 refills | Status: DC

## 2016-04-21 DIAGNOSIS — G8929 Other chronic pain: Secondary | ICD-10-CM | POA: Diagnosis not present

## 2016-04-21 DIAGNOSIS — R6889 Other general symptoms and signs: Secondary | ICD-10-CM | POA: Diagnosis not present

## 2016-04-21 DIAGNOSIS — M6281 Muscle weakness (generalized): Secondary | ICD-10-CM | POA: Diagnosis not present

## 2016-04-21 DIAGNOSIS — M545 Low back pain: Secondary | ICD-10-CM | POA: Diagnosis not present

## 2016-04-21 DIAGNOSIS — Z981 Arthrodesis status: Secondary | ICD-10-CM | POA: Diagnosis not present

## 2016-04-24 DIAGNOSIS — Z981 Arthrodesis status: Secondary | ICD-10-CM | POA: Diagnosis not present

## 2016-04-24 DIAGNOSIS — G8929 Other chronic pain: Secondary | ICD-10-CM | POA: Diagnosis not present

## 2016-04-24 DIAGNOSIS — M6281 Muscle weakness (generalized): Secondary | ICD-10-CM | POA: Diagnosis not present

## 2016-04-24 DIAGNOSIS — R6889 Other general symptoms and signs: Secondary | ICD-10-CM | POA: Diagnosis not present

## 2016-04-24 DIAGNOSIS — M545 Low back pain: Secondary | ICD-10-CM | POA: Diagnosis not present

## 2016-04-28 DIAGNOSIS — M545 Low back pain: Secondary | ICD-10-CM | POA: Diagnosis not present

## 2016-04-28 DIAGNOSIS — M6281 Muscle weakness (generalized): Secondary | ICD-10-CM | POA: Diagnosis not present

## 2016-04-28 DIAGNOSIS — G8929 Other chronic pain: Secondary | ICD-10-CM | POA: Diagnosis not present

## 2016-04-28 DIAGNOSIS — Z981 Arthrodesis status: Secondary | ICD-10-CM | POA: Diagnosis not present

## 2016-04-28 DIAGNOSIS — R6889 Other general symptoms and signs: Secondary | ICD-10-CM | POA: Diagnosis not present

## 2016-05-09 DIAGNOSIS — Z981 Arthrodesis status: Secondary | ICD-10-CM | POA: Diagnosis not present

## 2016-05-09 DIAGNOSIS — R6889 Other general symptoms and signs: Secondary | ICD-10-CM | POA: Diagnosis not present

## 2016-05-09 DIAGNOSIS — G8929 Other chronic pain: Secondary | ICD-10-CM | POA: Diagnosis not present

## 2016-05-09 DIAGNOSIS — M6281 Muscle weakness (generalized): Secondary | ICD-10-CM | POA: Diagnosis not present

## 2016-05-09 DIAGNOSIS — M545 Low back pain: Secondary | ICD-10-CM | POA: Diagnosis not present

## 2016-05-12 DIAGNOSIS — R6889 Other general symptoms and signs: Secondary | ICD-10-CM | POA: Diagnosis not present

## 2016-05-12 DIAGNOSIS — M545 Low back pain: Secondary | ICD-10-CM | POA: Diagnosis not present

## 2016-05-12 DIAGNOSIS — M6281 Muscle weakness (generalized): Secondary | ICD-10-CM | POA: Diagnosis not present

## 2016-05-12 DIAGNOSIS — Z981 Arthrodesis status: Secondary | ICD-10-CM | POA: Diagnosis not present

## 2016-05-12 DIAGNOSIS — G8929 Other chronic pain: Secondary | ICD-10-CM | POA: Diagnosis not present

## 2016-05-16 DIAGNOSIS — M6281 Muscle weakness (generalized): Secondary | ICD-10-CM | POA: Diagnosis not present

## 2016-05-16 DIAGNOSIS — G8929 Other chronic pain: Secondary | ICD-10-CM | POA: Diagnosis not present

## 2016-05-16 DIAGNOSIS — M545 Low back pain: Secondary | ICD-10-CM | POA: Diagnosis not present

## 2016-05-16 DIAGNOSIS — R6889 Other general symptoms and signs: Secondary | ICD-10-CM | POA: Diagnosis not present

## 2016-05-16 DIAGNOSIS — Z981 Arthrodesis status: Secondary | ICD-10-CM | POA: Diagnosis not present

## 2016-05-19 DIAGNOSIS — M5416 Radiculopathy, lumbar region: Secondary | ICD-10-CM | POA: Diagnosis not present

## 2016-05-19 DIAGNOSIS — M48061 Spinal stenosis, lumbar region without neurogenic claudication: Secondary | ICD-10-CM | POA: Diagnosis not present

## 2016-05-19 DIAGNOSIS — M545 Low back pain: Secondary | ICD-10-CM | POA: Diagnosis not present

## 2016-05-19 DIAGNOSIS — G8929 Other chronic pain: Secondary | ICD-10-CM | POA: Diagnosis not present

## 2016-05-19 DIAGNOSIS — M6281 Muscle weakness (generalized): Secondary | ICD-10-CM | POA: Diagnosis not present

## 2016-05-19 DIAGNOSIS — R6889 Other general symptoms and signs: Secondary | ICD-10-CM | POA: Diagnosis not present

## 2016-05-19 DIAGNOSIS — Z981 Arthrodesis status: Secondary | ICD-10-CM | POA: Diagnosis not present

## 2016-05-23 DIAGNOSIS — M48061 Spinal stenosis, lumbar region without neurogenic claudication: Secondary | ICD-10-CM | POA: Diagnosis not present

## 2016-05-23 DIAGNOSIS — Z4789 Encounter for other orthopedic aftercare: Secondary | ICD-10-CM | POA: Diagnosis not present

## 2016-05-24 DIAGNOSIS — G8929 Other chronic pain: Secondary | ICD-10-CM | POA: Diagnosis not present

## 2016-05-24 DIAGNOSIS — R6889 Other general symptoms and signs: Secondary | ICD-10-CM | POA: Diagnosis not present

## 2016-05-24 DIAGNOSIS — M48061 Spinal stenosis, lumbar region without neurogenic claudication: Secondary | ICD-10-CM | POA: Diagnosis not present

## 2016-05-24 DIAGNOSIS — M6281 Muscle weakness (generalized): Secondary | ICD-10-CM | POA: Diagnosis not present

## 2016-05-24 DIAGNOSIS — M545 Low back pain: Secondary | ICD-10-CM | POA: Diagnosis not present

## 2016-05-24 DIAGNOSIS — Z981 Arthrodesis status: Secondary | ICD-10-CM | POA: Diagnosis not present

## 2016-05-26 DIAGNOSIS — M48061 Spinal stenosis, lumbar region without neurogenic claudication: Secondary | ICD-10-CM | POA: Diagnosis not present

## 2016-05-26 DIAGNOSIS — G8929 Other chronic pain: Secondary | ICD-10-CM | POA: Diagnosis not present

## 2016-05-26 DIAGNOSIS — M6281 Muscle weakness (generalized): Secondary | ICD-10-CM | POA: Diagnosis not present

## 2016-05-26 DIAGNOSIS — R6889 Other general symptoms and signs: Secondary | ICD-10-CM | POA: Diagnosis not present

## 2016-05-26 DIAGNOSIS — M545 Low back pain: Secondary | ICD-10-CM | POA: Diagnosis not present

## 2016-05-26 DIAGNOSIS — Z981 Arthrodesis status: Secondary | ICD-10-CM | POA: Diagnosis not present

## 2016-05-30 ENCOUNTER — Other Ambulatory Visit: Payer: Self-pay | Admitting: Cardiovascular Disease

## 2016-05-30 DIAGNOSIS — M48061 Spinal stenosis, lumbar region without neurogenic claudication: Secondary | ICD-10-CM | POA: Diagnosis not present

## 2016-05-30 DIAGNOSIS — M545 Low back pain: Secondary | ICD-10-CM | POA: Diagnosis not present

## 2016-05-30 DIAGNOSIS — G8929 Other chronic pain: Secondary | ICD-10-CM | POA: Diagnosis not present

## 2016-05-30 DIAGNOSIS — E785 Hyperlipidemia, unspecified: Secondary | ICD-10-CM

## 2016-05-30 DIAGNOSIS — I2510 Atherosclerotic heart disease of native coronary artery without angina pectoris: Secondary | ICD-10-CM

## 2016-05-30 DIAGNOSIS — Z981 Arthrodesis status: Secondary | ICD-10-CM | POA: Diagnosis not present

## 2016-05-30 DIAGNOSIS — R6889 Other general symptoms and signs: Secondary | ICD-10-CM | POA: Diagnosis not present

## 2016-05-30 DIAGNOSIS — M6281 Muscle weakness (generalized): Secondary | ICD-10-CM | POA: Diagnosis not present

## 2016-05-31 DIAGNOSIS — N401 Enlarged prostate with lower urinary tract symptoms: Secondary | ICD-10-CM | POA: Diagnosis not present

## 2016-05-31 DIAGNOSIS — R350 Frequency of micturition: Secondary | ICD-10-CM | POA: Diagnosis not present

## 2016-05-31 DIAGNOSIS — R310 Gross hematuria: Secondary | ICD-10-CM | POA: Diagnosis not present

## 2016-05-31 DIAGNOSIS — R351 Nocturia: Secondary | ICD-10-CM | POA: Diagnosis not present

## 2016-06-02 DIAGNOSIS — R6889 Other general symptoms and signs: Secondary | ICD-10-CM | POA: Diagnosis not present

## 2016-06-02 DIAGNOSIS — M545 Low back pain: Secondary | ICD-10-CM | POA: Diagnosis not present

## 2016-06-02 DIAGNOSIS — M48061 Spinal stenosis, lumbar region without neurogenic claudication: Secondary | ICD-10-CM | POA: Diagnosis not present

## 2016-06-02 DIAGNOSIS — M6281 Muscle weakness (generalized): Secondary | ICD-10-CM | POA: Diagnosis not present

## 2016-06-02 DIAGNOSIS — Z23 Encounter for immunization: Secondary | ICD-10-CM | POA: Diagnosis not present

## 2016-06-02 DIAGNOSIS — G8929 Other chronic pain: Secondary | ICD-10-CM | POA: Diagnosis not present

## 2016-06-02 DIAGNOSIS — Z981 Arthrodesis status: Secondary | ICD-10-CM | POA: Diagnosis not present

## 2016-06-07 DIAGNOSIS — R6889 Other general symptoms and signs: Secondary | ICD-10-CM | POA: Diagnosis not present

## 2016-06-07 DIAGNOSIS — Z981 Arthrodesis status: Secondary | ICD-10-CM | POA: Diagnosis not present

## 2016-06-07 DIAGNOSIS — M545 Low back pain: Secondary | ICD-10-CM | POA: Diagnosis not present

## 2016-06-07 DIAGNOSIS — M6281 Muscle weakness (generalized): Secondary | ICD-10-CM | POA: Diagnosis not present

## 2016-06-07 DIAGNOSIS — M48061 Spinal stenosis, lumbar region without neurogenic claudication: Secondary | ICD-10-CM | POA: Diagnosis not present

## 2016-06-07 DIAGNOSIS — G8929 Other chronic pain: Secondary | ICD-10-CM | POA: Diagnosis not present

## 2016-06-13 DIAGNOSIS — M6281 Muscle weakness (generalized): Secondary | ICD-10-CM | POA: Diagnosis not present

## 2016-06-13 DIAGNOSIS — M48061 Spinal stenosis, lumbar region without neurogenic claudication: Secondary | ICD-10-CM | POA: Diagnosis not present

## 2016-06-13 DIAGNOSIS — M545 Low back pain: Secondary | ICD-10-CM | POA: Diagnosis not present

## 2016-06-13 DIAGNOSIS — G8929 Other chronic pain: Secondary | ICD-10-CM | POA: Diagnosis not present

## 2016-06-13 DIAGNOSIS — Z981 Arthrodesis status: Secondary | ICD-10-CM | POA: Diagnosis not present

## 2016-06-13 DIAGNOSIS — R6889 Other general symptoms and signs: Secondary | ICD-10-CM | POA: Diagnosis not present

## 2016-06-16 DIAGNOSIS — Z4789 Encounter for other orthopedic aftercare: Secondary | ICD-10-CM | POA: Diagnosis not present

## 2016-06-16 DIAGNOSIS — M48061 Spinal stenosis, lumbar region without neurogenic claudication: Secondary | ICD-10-CM | POA: Diagnosis not present

## 2016-06-21 DIAGNOSIS — Z4789 Encounter for other orthopedic aftercare: Secondary | ICD-10-CM | POA: Diagnosis not present

## 2016-06-21 DIAGNOSIS — M48061 Spinal stenosis, lumbar region without neurogenic claudication: Secondary | ICD-10-CM | POA: Diagnosis not present

## 2016-07-07 DIAGNOSIS — Z4789 Encounter for other orthopedic aftercare: Secondary | ICD-10-CM | POA: Diagnosis not present

## 2016-07-07 DIAGNOSIS — Z9889 Other specified postprocedural states: Secondary | ICD-10-CM | POA: Diagnosis not present

## 2016-07-07 DIAGNOSIS — Z01812 Encounter for preprocedural laboratory examination: Secondary | ICD-10-CM | POA: Diagnosis not present

## 2016-07-11 DIAGNOSIS — M48061 Spinal stenosis, lumbar region without neurogenic claudication: Secondary | ICD-10-CM | POA: Diagnosis not present

## 2016-07-11 DIAGNOSIS — M5416 Radiculopathy, lumbar region: Secondary | ICD-10-CM | POA: Diagnosis not present

## 2016-07-18 DIAGNOSIS — M5416 Radiculopathy, lumbar region: Secondary | ICD-10-CM | POA: Diagnosis not present

## 2016-07-18 DIAGNOSIS — M48061 Spinal stenosis, lumbar region without neurogenic claudication: Secondary | ICD-10-CM | POA: Diagnosis not present

## 2016-07-18 DIAGNOSIS — Z9889 Other specified postprocedural states: Secondary | ICD-10-CM | POA: Diagnosis not present

## 2016-07-19 ENCOUNTER — Other Ambulatory Visit: Payer: Self-pay | Admitting: Orthopedic Surgery

## 2016-07-19 DIAGNOSIS — M48061 Spinal stenosis, lumbar region without neurogenic claudication: Secondary | ICD-10-CM

## 2016-07-19 DIAGNOSIS — M5416 Radiculopathy, lumbar region: Principal | ICD-10-CM

## 2016-07-21 ENCOUNTER — Other Ambulatory Visit: Payer: Self-pay | Admitting: Orthopedic Surgery

## 2016-07-21 ENCOUNTER — Ambulatory Visit
Admission: RE | Admit: 2016-07-21 | Discharge: 2016-07-21 | Disposition: A | Discharge status: Home / Self Care | Payer: Medicare Other | Source: Ambulatory Visit | Attending: Orthopedic Surgery | Admitting: Orthopedic Surgery

## 2016-07-21 DIAGNOSIS — M48061 Spinal stenosis, lumbar region without neurogenic claudication: Secondary | ICD-10-CM

## 2016-07-21 DIAGNOSIS — M5416 Radiculopathy, lumbar region: Principal | ICD-10-CM

## 2016-07-21 DIAGNOSIS — R109 Unspecified abdominal pain: Secondary | ICD-10-CM | POA: Diagnosis not present

## 2016-07-21 MED ORDER — IOPAMIDOL (ISOVUE-300) INJECTION 61%
100.0000 mL | Freq: Once | INTRAVENOUS | Status: AC | PRN
  Administered 2016-07-21: 100 mL via INTRAVENOUS

## 2016-07-26 ENCOUNTER — Ambulatory Visit
Admission: RE | Admit: 2016-07-26 | Discharge: 2016-07-26 | Disposition: A | Discharge status: Home / Self Care | Payer: Medicare Other | Source: Ambulatory Visit | Attending: Orthopedic Surgery | Admitting: Orthopedic Surgery

## 2016-07-26 DIAGNOSIS — M5416 Radiculopathy, lumbar region: Principal | ICD-10-CM

## 2016-07-26 DIAGNOSIS — M545 Low back pain: Secondary | ICD-10-CM | POA: Diagnosis not present

## 2016-07-26 DIAGNOSIS — M48061 Spinal stenosis, lumbar region without neurogenic claudication: Secondary | ICD-10-CM

## 2016-07-26 MED ORDER — METHYLPREDNISOLONE ACETATE 40 MG/ML INJ SUSP (RADIOLOG
120.0000 mg | Freq: Once | INTRAMUSCULAR | Status: AC
  Administered 2016-07-26: 120 mg via EPIDURAL

## 2016-07-26 MED ORDER — IOPAMIDOL (ISOVUE-M 200) INJECTION 41%
1.0000 mL | Freq: Once | INTRAMUSCULAR | Status: AC
  Administered 2016-07-26: 1 mL via EPIDURAL

## 2016-07-26 NOTE — Discharge Instructions (Signed)

## 2016-08-07 ENCOUNTER — Telehealth: Payer: Self-pay | Admitting: Cardiovascular Disease

## 2016-08-07 NOTE — Telephone Encounter (Signed)
Patient's wife (DPR)  is calling because her husband is sick with a fever. Informed patient's wife to go to urgent care, since they do not have a PCP. Patient's wife verbalized understanding.

## 2016-08-07 NOTE — Telephone Encounter (Signed)
New message    Pt wife is calling asking for a call from RN.

## 2016-08-11 DIAGNOSIS — M48061 Spinal stenosis, lumbar region without neurogenic claudication: Secondary | ICD-10-CM | POA: Diagnosis not present

## 2016-08-11 DIAGNOSIS — M5416 Radiculopathy, lumbar region: Secondary | ICD-10-CM | POA: Diagnosis not present

## 2016-08-11 DIAGNOSIS — Z9889 Other specified postprocedural states: Secondary | ICD-10-CM | POA: Diagnosis not present

## 2016-08-22 DIAGNOSIS — M48061 Spinal stenosis, lumbar region without neurogenic claudication: Secondary | ICD-10-CM | POA: Diagnosis not present

## 2016-08-22 DIAGNOSIS — R6889 Other general symptoms and signs: Secondary | ICD-10-CM | POA: Diagnosis not present

## 2016-08-22 DIAGNOSIS — Z981 Arthrodesis status: Secondary | ICD-10-CM | POA: Diagnosis not present

## 2016-08-22 DIAGNOSIS — M6281 Muscle weakness (generalized): Secondary | ICD-10-CM | POA: Diagnosis not present

## 2016-08-22 DIAGNOSIS — G8929 Other chronic pain: Secondary | ICD-10-CM | POA: Diagnosis not present

## 2016-08-22 DIAGNOSIS — M545 Low back pain: Secondary | ICD-10-CM | POA: Diagnosis not present

## 2016-08-24 DIAGNOSIS — G8929 Other chronic pain: Secondary | ICD-10-CM | POA: Diagnosis not present

## 2016-08-24 DIAGNOSIS — Z981 Arthrodesis status: Secondary | ICD-10-CM | POA: Diagnosis not present

## 2016-08-24 DIAGNOSIS — M48061 Spinal stenosis, lumbar region without neurogenic claudication: Secondary | ICD-10-CM | POA: Diagnosis not present

## 2016-08-24 DIAGNOSIS — M6281 Muscle weakness (generalized): Secondary | ICD-10-CM | POA: Diagnosis not present

## 2016-08-24 DIAGNOSIS — R6889 Other general symptoms and signs: Secondary | ICD-10-CM | POA: Diagnosis not present

## 2016-08-24 DIAGNOSIS — M545 Low back pain: Secondary | ICD-10-CM | POA: Diagnosis not present

## 2016-08-31 DIAGNOSIS — M48061 Spinal stenosis, lumbar region without neurogenic claudication: Secondary | ICD-10-CM | POA: Diagnosis not present

## 2016-08-31 DIAGNOSIS — Z981 Arthrodesis status: Secondary | ICD-10-CM | POA: Diagnosis not present

## 2016-08-31 DIAGNOSIS — M545 Low back pain: Secondary | ICD-10-CM | POA: Diagnosis not present

## 2016-08-31 DIAGNOSIS — M6281 Muscle weakness (generalized): Secondary | ICD-10-CM | POA: Diagnosis not present

## 2016-08-31 DIAGNOSIS — R6889 Other general symptoms and signs: Secondary | ICD-10-CM | POA: Diagnosis not present

## 2016-08-31 DIAGNOSIS — G8929 Other chronic pain: Secondary | ICD-10-CM | POA: Diagnosis not present

## 2016-09-04 DIAGNOSIS — M6281 Muscle weakness (generalized): Secondary | ICD-10-CM | POA: Diagnosis not present

## 2016-09-04 DIAGNOSIS — M48061 Spinal stenosis, lumbar region without neurogenic claudication: Secondary | ICD-10-CM | POA: Diagnosis not present

## 2016-09-04 DIAGNOSIS — G8929 Other chronic pain: Secondary | ICD-10-CM | POA: Diagnosis not present

## 2016-09-04 DIAGNOSIS — R6889 Other general symptoms and signs: Secondary | ICD-10-CM | POA: Diagnosis not present

## 2016-09-04 DIAGNOSIS — M545 Low back pain: Secondary | ICD-10-CM | POA: Diagnosis not present

## 2016-09-04 DIAGNOSIS — Z981 Arthrodesis status: Secondary | ICD-10-CM | POA: Diagnosis not present

## 2016-09-19 DIAGNOSIS — R6889 Other general symptoms and signs: Secondary | ICD-10-CM | POA: Diagnosis not present

## 2016-09-19 DIAGNOSIS — M48061 Spinal stenosis, lumbar region without neurogenic claudication: Secondary | ICD-10-CM | POA: Diagnosis not present

## 2016-09-19 DIAGNOSIS — Z981 Arthrodesis status: Secondary | ICD-10-CM | POA: Diagnosis not present

## 2016-09-19 DIAGNOSIS — M6281 Muscle weakness (generalized): Secondary | ICD-10-CM | POA: Diagnosis not present

## 2016-09-19 DIAGNOSIS — G8929 Other chronic pain: Secondary | ICD-10-CM | POA: Diagnosis not present

## 2016-09-19 DIAGNOSIS — M545 Low back pain: Secondary | ICD-10-CM | POA: Diagnosis not present

## 2016-09-26 DIAGNOSIS — M48061 Spinal stenosis, lumbar region without neurogenic claudication: Secondary | ICD-10-CM | POA: Diagnosis not present

## 2016-09-26 DIAGNOSIS — G8929 Other chronic pain: Secondary | ICD-10-CM | POA: Diagnosis not present

## 2016-09-26 DIAGNOSIS — M6281 Muscle weakness (generalized): Secondary | ICD-10-CM | POA: Diagnosis not present

## 2016-09-26 DIAGNOSIS — R6889 Other general symptoms and signs: Secondary | ICD-10-CM | POA: Diagnosis not present

## 2016-09-26 DIAGNOSIS — Z981 Arthrodesis status: Secondary | ICD-10-CM | POA: Diagnosis not present

## 2016-09-26 DIAGNOSIS — M545 Low back pain: Secondary | ICD-10-CM | POA: Diagnosis not present

## 2016-10-03 DIAGNOSIS — M48061 Spinal stenosis, lumbar region without neurogenic claudication: Secondary | ICD-10-CM | POA: Diagnosis not present

## 2016-10-03 DIAGNOSIS — R6889 Other general symptoms and signs: Secondary | ICD-10-CM | POA: Diagnosis not present

## 2016-10-03 DIAGNOSIS — Z981 Arthrodesis status: Secondary | ICD-10-CM | POA: Diagnosis not present

## 2016-10-03 DIAGNOSIS — M545 Low back pain: Secondary | ICD-10-CM | POA: Diagnosis not present

## 2016-10-03 DIAGNOSIS — G8929 Other chronic pain: Secondary | ICD-10-CM | POA: Diagnosis not present

## 2016-10-03 DIAGNOSIS — M6281 Muscle weakness (generalized): Secondary | ICD-10-CM | POA: Diagnosis not present

## 2016-12-20 DIAGNOSIS — L03116 Cellulitis of left lower limb: Secondary | ICD-10-CM | POA: Diagnosis not present

## 2016-12-20 DIAGNOSIS — S81802A Unspecified open wound, left lower leg, initial encounter: Secondary | ICD-10-CM | POA: Diagnosis not present

## 2016-12-21 DIAGNOSIS — I10 Essential (primary) hypertension: Secondary | ICD-10-CM | POA: Diagnosis not present

## 2016-12-21 DIAGNOSIS — Z23 Encounter for immunization: Secondary | ICD-10-CM | POA: Diagnosis not present

## 2016-12-21 DIAGNOSIS — S81802A Unspecified open wound, left lower leg, initial encounter: Secondary | ICD-10-CM | POA: Diagnosis not present

## 2016-12-21 DIAGNOSIS — E785 Hyperlipidemia, unspecified: Secondary | ICD-10-CM | POA: Diagnosis not present

## 2016-12-21 DIAGNOSIS — L03116 Cellulitis of left lower limb: Secondary | ICD-10-CM | POA: Diagnosis not present

## 2016-12-21 DIAGNOSIS — S81802D Unspecified open wound, left lower leg, subsequent encounter: Secondary | ICD-10-CM | POA: Diagnosis not present

## 2016-12-22 ENCOUNTER — Telehealth: Payer: Self-pay | Admitting: Cardiovascular Disease

## 2016-12-22 NOTE — Telephone Encounter (Signed)
New message  Pt wife call requesting to speak with RN. Pt wife states she was returning RN call .please call back to discuss if needed

## 2016-12-22 NOTE — Telephone Encounter (Signed)
Called patient back to make a f/u office visit appt on the same day his wife has her office visit and echo. Patient will see Dr. Johnsie Cancel on 01/04/17

## 2016-12-26 NOTE — Progress Notes (Signed)
Patient ID: Gregory Guerrero, male   DOB: [birth year], 71 y.o.   MRN: [id number]   71 y.o. form [place] History of CAD. 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 Had some chest pains before procedures. He has elevated lipids , HTN and DM. He is sedentary Quit smoking 30 years ago. Mild exertional dyspnea. Poor diet. 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 Saw a cardiologist in [place] 2016  but didn't like them LDL was 113 Here last mont 115  Home BP readings are high BP cuff is irratic and doesn't correlate with our BP though  Last visit had him take Hyzaar in am   Not compliant with both meds ran out of hyzaar  ETT:  02/25/15 normal ETT   Had posterior spinal fusion L2-S1 decompression L4-S1 03/02/16 with [name] No cardiac complications  Multiple medical issues. Does not have primary  Has non healing venous ulcer on inside LLL. Has finished course of doxycycline prescribed by clinic in [facility] Has tingling in left wrist ? Carpal tunnel. Needs to be tested for DM Indicates some lab work from [facility] indicated some component of CRF  ROS: Denies fever, malais, weight loss, blurry vision, decreased visual acuity, cough, sputum, SOB, hemoptysis, pleuritic pain, palpitaitons, heartburn, abdominal pain, melena, lower extremity edema, claudication, or rash.  All other systems reviewed and negative  General: BP (!) 150/80   Pulse 76   Ht 5\' 8"  (1.727 m)   Wt 239 lb (108.4 kg)   BMI 36.34 kg/m  Affect appropriate Healthy:  appears stated age 71: normal Neck supple with no adenopathy JVP normal no bruits no thyromegaly Lungs clear with no wheezing and good diaphragmatic motion Heart:  S1/S2 no murmur, no rub, gallop or click PMI normal Abdomen: benighn, BS positve, no tenderness, no AAA no bruit.  No HSM or HJR Distal pulses intact with no bruits No edema Neuro non-focal Skin warm and dry has dry eschar on ulcer LLE No muscular  weakness Post lumbar spinal fusion    Current Outpatient Prescriptions  Medication Sig Dispense Refill  . losartan-hydrochlorothiazide (HYZAAR) 100-25 MG tablet Take 1 tablet by mouth daily. 90 tablet 3  . simvastatin (ZOCOR) 40 MG tablet TAKE ONE TABLET BY MOUTH DAILY 90 tablet 2  . amLODipine (NORVASC) 10 MG tablet TAKE ONE TABLET BY MOUTH ONCE DAILY (Patient not taking: Reported on 01/04/2017) 90 tablet 3  . Ascorbic Acid (VITAMIN C) 1000 MG tablet Take 1,000 mg by mouth daily.    . Multiple Vitamin (MULTIVITAMIN WITH MINERALS) TABS tablet Take 1 tablet by mouth daily.     No current facility-administered medications for this visit.     Allergies  No known allergies  Electrocardiogram:  04/25/13  SR rate 54 normal   02/25/15  SR rate 63 normal 01/04/17 SR rate 76 PVC   Assessment and Plan CAD:  Distant stent to LAD 2009 no angina continue medical  ETT 02/2015 normal   HTN:  Well controlled.  Continue current medications and low sodium Dash type diet.   Chol:  On statin f/u labs  Ortho:  Post spinal fusion and decompression 02/2016 f/u [name]   Wound: will check ABI's make sure arterial circulation ok refer to wound center DM:  Will check AIC CRF: will check BMET today ?Carpal Tunnel:  Encouraged him to f/u with IM for testing and possibly have C spine films    Jenkins Rouge

## 2017-01-04 ENCOUNTER — Encounter: Payer: Self-pay | Admitting: Cardiovascular Disease

## 2017-01-04 ENCOUNTER — Ambulatory Visit (INDEPENDENT_AMBULATORY_CARE_PROVIDER_SITE_OTHER): Payer: Medicare Other | Admitting: Cardiovascular Disease

## 2017-01-04 VITALS — BP 150/80 | HR 76 | Ht 68.0 in | Wt 239.0 lb

## 2017-01-04 DIAGNOSIS — I2510 Atherosclerotic heart disease of native coronary artery without angina pectoris: Secondary | ICD-10-CM

## 2017-01-04 DIAGNOSIS — L97509 Non-pressure chronic ulcer of other part of unspecified foot with unspecified severity: Secondary | ICD-10-CM | POA: Diagnosis not present

## 2017-01-04 DIAGNOSIS — I83029 Varicose veins of left lower extremity with ulcer of unspecified site: Secondary | ICD-10-CM

## 2017-01-04 DIAGNOSIS — N19 Unspecified kidney failure: Secondary | ICD-10-CM

## 2017-01-04 DIAGNOSIS — I10 Essential (primary) hypertension: Secondary | ICD-10-CM | POA: Diagnosis not present

## 2017-01-04 DIAGNOSIS — I739 Peripheral vascular disease, unspecified: Secondary | ICD-10-CM | POA: Diagnosis not present

## 2017-01-04 DIAGNOSIS — E11621 Type 2 diabetes mellitus with foot ulcer: Secondary | ICD-10-CM

## 2017-01-04 DIAGNOSIS — E785 Hyperlipidemia, unspecified: Secondary | ICD-10-CM

## 2017-01-04 DIAGNOSIS — L97929 Non-pressure chronic ulcer of unspecified part of left lower leg with unspecified severity: Secondary | ICD-10-CM

## 2017-01-04 NOTE — Patient Instructions (Addendum)
Medication Instructions:  Your physician recommends that you continue on your current medications as directed. Please refer to the Current Medication list given to you today.  Labwork: Your physician recommends that you need lab work today- BMET, Hgb A1c  Testing/Procedures: Your physician has requested that you have an exercise tolerance test. For further information please visit [email or web address]. Please also follow instruction sheet, as given.  Your physician has requested that you have an ankle brachial index (ABI). During this test an ultrasound and blood pressure cuff are used to evaluate the arteries that supply the arms and legs with blood. Allow thirty minutes for this exam. There are no restrictions or special instructions.  Follow-Up: Your physician wants you to follow-up in: 6 months with Dr. Johnsie Cancel. You will receive a reminder letter in the mail two months in advance. If you don't receive a letter, please call our office to schedule the follow-up appointment.  You have been referred to the wound clinic. They will call you with an appointment.  If you need a refill on your cardiac medications before your next appointment, please call your pharmacy.

## 2017-01-04 NOTE — Addendum Note (Signed)
Addended by: Aris Georgia, Helmi Hechavarria L on: 01/04/2017 01:17 PM   Modules accepted: Orders

## 2017-01-05 ENCOUNTER — Other Ambulatory Visit: Payer: Self-pay

## 2017-01-05 ENCOUNTER — Other Ambulatory Visit: Payer: Self-pay | Admitting: Cardiovascular Disease

## 2017-01-05 DIAGNOSIS — I10 Essential (primary) hypertension: Secondary | ICD-10-CM

## 2017-01-05 DIAGNOSIS — I83029 Varicose veins of left lower extremity with ulcer of unspecified site: Secondary | ICD-10-CM

## 2017-01-05 DIAGNOSIS — L97929 Non-pressure chronic ulcer of unspecified part of left lower leg with unspecified severity: Principal | ICD-10-CM

## 2017-01-05 LAB — HEMOGLOBIN A1C
ESTIMATED AVERAGE GLUCOSE: 111 mg/dL
HEMOGLOBIN A1C: 5.5 % (ref 4.8–5.6)

## 2017-01-05 LAB — BASIC METABOLIC PANEL
BUN / CREAT RATIO: 20 (ref 10–24)
BUN: 30 mg/dL — ABNORMAL HIGH (ref 8–27)
CO2: 23 mmol/L (ref 20–29)
Calcium: 9.9 mg/dL (ref 8.6–10.2)
Chloride: 106 mmol/L (ref 96–106)
Creatinine, Ser: 1.49 mg/dL — ABNORMAL HIGH (ref 0.76–1.27)
GFR calc non Af Amer: 46 mL/min/{1.73_m2} — ABNORMAL LOW (ref >59)
GFR, EST AFRICAN AMERICAN: 53 mL/min/{1.73_m2} — AB (ref >59)
GLUCOSE: 82 mg/dL (ref 65–99)
POTASSIUM: 4.7 mmol/L (ref 3.5–5.2)
SODIUM: 145 mmol/L — AB (ref 134–144)

## 2017-01-05 MED ORDER — LOSARTAN POTASSIUM 100 MG PO TABS: 100.0000 mg | ORAL_TABLET | Freq: Every day | ORAL | 3 refills | Status: DC

## 2017-01-08 ENCOUNTER — Telehealth: Payer: Self-pay | Admitting: Cardiovascular Disease

## 2017-01-08 NOTE — Telephone Encounter (Signed)
Patient was unable to get an appt sooner than a month with wound clinic at [facility]. Called Cleo at [facility] at [phone] to see when they can see patient. Cleo stated that they could see the patient this week. Will fax referral and office note to  Fax [phone]. Called patient to let him know about changes of locations and who to call for an appointment. Patient verbalized understanding.

## 2017-01-08 NOTE — Telephone Encounter (Signed)
New message    Patient spouse has questions about wound care appointment. Please call

## 2017-01-10 ENCOUNTER — Inpatient Hospital Stay (HOSPITAL_COMMUNITY): Admission: RE | Admit: 2017-01-10 | Payer: Medicare Other | Source: Ambulatory Visit

## 2017-01-10 DIAGNOSIS — S81802A Unspecified open wound, left lower leg, initial encounter: Secondary | ICD-10-CM | POA: Diagnosis not present

## 2017-01-10 DIAGNOSIS — L97821 Non-pressure chronic ulcer of other part of left lower leg limited to breakdown of skin: Secondary | ICD-10-CM | POA: Diagnosis not present

## 2017-01-18 DIAGNOSIS — L97822 Non-pressure chronic ulcer of other part of left lower leg with fat layer exposed: Secondary | ICD-10-CM | POA: Diagnosis not present

## 2017-01-18 DIAGNOSIS — L97921 Non-pressure chronic ulcer of unspecified part of left lower leg limited to breakdown of skin: Secondary | ICD-10-CM | POA: Diagnosis not present

## 2017-01-18 DIAGNOSIS — I890 Lymphedema, not elsewhere classified: Secondary | ICD-10-CM | POA: Diagnosis not present

## 2017-01-18 DIAGNOSIS — I119 Hypertensive heart disease without heart failure: Secondary | ICD-10-CM | POA: Diagnosis not present

## 2017-01-18 DIAGNOSIS — I2510 Atherosclerotic heart disease of native coronary artery without angina pectoris: Secondary | ICD-10-CM | POA: Diagnosis not present

## 2017-01-18 DIAGNOSIS — Z86718 Personal history of other venous thrombosis and embolism: Secondary | ICD-10-CM | POA: Diagnosis not present

## 2017-01-19 ENCOUNTER — Other Ambulatory Visit: Payer: Medicare Other

## 2017-01-19 ENCOUNTER — Other Ambulatory Visit: Payer: Self-pay | Admitting: Cardiovascular Disease

## 2017-01-19 ENCOUNTER — Ambulatory Visit (HOSPITAL_COMMUNITY)
Admission: RE | Admit: 2017-01-19 | Discharge: 2017-01-19 | Disposition: A | Discharge status: Home / Self Care | Payer: Medicare Other | Source: Ambulatory Visit | Attending: Cardiology | Admitting: Cardiology

## 2017-01-19 DIAGNOSIS — R200 Anesthesia of skin: Secondary | ICD-10-CM | POA: Insufficient documentation

## 2017-01-19 DIAGNOSIS — E119 Type 2 diabetes mellitus without complications: Secondary | ICD-10-CM | POA: Diagnosis not present

## 2017-01-19 DIAGNOSIS — Z86718 Personal history of other venous thrombosis and embolism: Secondary | ICD-10-CM | POA: Diagnosis not present

## 2017-01-19 DIAGNOSIS — E785 Hyperlipidemia, unspecified: Secondary | ICD-10-CM | POA: Insufficient documentation

## 2017-01-19 DIAGNOSIS — I252 Old myocardial infarction: Secondary | ICD-10-CM | POA: Diagnosis not present

## 2017-01-19 DIAGNOSIS — I10 Essential (primary) hypertension: Secondary | ICD-10-CM | POA: Insufficient documentation

## 2017-01-19 DIAGNOSIS — S81802D Unspecified open wound, left lower leg, subsequent encounter: Secondary | ICD-10-CM

## 2017-01-19 DIAGNOSIS — R202 Paresthesia of skin: Secondary | ICD-10-CM | POA: Diagnosis not present

## 2017-01-19 DIAGNOSIS — I2510 Atherosclerotic heart disease of native coronary artery without angina pectoris: Secondary | ICD-10-CM | POA: Insufficient documentation

## 2017-01-19 DIAGNOSIS — X58XXXD Exposure to other specified factors, subsequent encounter: Secondary | ICD-10-CM | POA: Diagnosis not present

## 2017-01-20 LAB — BASIC METABOLIC PANEL
BUN/Creatinine Ratio: 15 (ref 10–24)
BUN: 21 mg/dL (ref 8–27)
CALCIUM: 9.2 mg/dL (ref 8.6–10.2)
CHLORIDE: 107 mmol/L — AB (ref 96–106)
CO2: 24 mmol/L (ref 20–29)
Creatinine, Ser: 1.44 mg/dL — ABNORMAL HIGH (ref 0.76–1.27)
GFR calc Af Amer: 56 mL/min/{1.73_m2} — ABNORMAL LOW (ref >59)
GFR calc non Af Amer: 48 mL/min/{1.73_m2} — ABNORMAL LOW (ref >59)
GLUCOSE: 78 mg/dL (ref 65–99)
POTASSIUM: 4.4 mmol/L (ref 3.5–5.2)
Sodium: 142 mmol/L (ref 134–144)

## 2017-01-22 ENCOUNTER — Telehealth: Payer: Self-pay | Admitting: Internal Medicine

## 2017-01-22 DIAGNOSIS — N189 Chronic kidney disease, unspecified: Secondary | ICD-10-CM

## 2017-01-22 NOTE — Telephone Encounter (Signed)
Referred by Dr Johnsie Cancel, needs a PCP, please arrange a OV at pt's convenience JP

## 2017-01-22 NOTE — Telephone Encounter (Signed)
-----   Message from [name], MD sent at 01/22/2017  4:16 PM EDT ----- Have him see [facility] new patient for CRF  ----- Message ----- From: Michaelyn Barter, RN Sent: 01/22/2017   3:18 PM To: [name], MD  Called patient with lab results. Patient does not have a PCP at this time, and wants to know what he can do in the mean time for his CRF. Patient also stated Dr. Johnsie Cancel was going to see if there was any PCP's taking new patients. Will forward to Dr. Johnsie Cancel for advisement.

## 2017-01-22 NOTE — Telephone Encounter (Signed)
Patient's wife (DPR) is aware that a referral has been put in for [name] as new patient for CRF. Informed patient's wife that someone from Dr. Larose Kells office will be calling to schedule an appointment as well for PCP.

## 2017-01-22 NOTE — Telephone Encounter (Signed)
Needs 30 min new patient appt.

## 2017-01-22 NOTE — Telephone Encounter (Signed)
Patient scheduled with Dr. Larose Kells for 01/24/2017 at 2pm for new patient appointment.

## 2017-01-23 DIAGNOSIS — Z23 Encounter for immunization: Secondary | ICD-10-CM | POA: Diagnosis not present

## 2017-01-23 DIAGNOSIS — M48061 Spinal stenosis, lumbar region without neurogenic claudication: Secondary | ICD-10-CM | POA: Insufficient documentation

## 2017-01-24 ENCOUNTER — Ambulatory Visit (HOSPITAL_BASED_OUTPATIENT_CLINIC_OR_DEPARTMENT_OTHER)
Admission: RE | Admit: 2017-01-24 | Discharge: 2017-01-24 | Disposition: A | Discharge status: Home / Self Care | Payer: Medicare Other | Source: Ambulatory Visit | Attending: Internal Medicine | Admitting: Internal Medicine

## 2017-01-24 ENCOUNTER — Ambulatory Visit (INDEPENDENT_AMBULATORY_CARE_PROVIDER_SITE_OTHER): Payer: Medicare Other | Admitting: Internal Medicine

## 2017-01-24 ENCOUNTER — Encounter: Payer: Self-pay | Admitting: Internal Medicine

## 2017-01-24 VITALS — BP 152/80 | HR 80 | Temp 97.7°F | Resp 14 | Ht 68.0 in | Wt 244.4 lb

## 2017-01-24 DIAGNOSIS — R609 Edema, unspecified: Secondary | ICD-10-CM | POA: Insufficient documentation

## 2017-01-24 DIAGNOSIS — I10 Essential (primary) hypertension: Secondary | ICD-10-CM

## 2017-01-24 DIAGNOSIS — R0609 Other forms of dyspnea: Secondary | ICD-10-CM | POA: Diagnosis not present

## 2017-01-24 DIAGNOSIS — I2510 Atherosclerotic heart disease of native coronary artery without angina pectoris: Secondary | ICD-10-CM | POA: Diagnosis not present

## 2017-01-24 DIAGNOSIS — R0602 Shortness of breath: Secondary | ICD-10-CM | POA: Diagnosis not present

## 2017-01-24 DIAGNOSIS — R600 Localized edema: Secondary | ICD-10-CM | POA: Diagnosis not present

## 2017-01-24 DIAGNOSIS — J90 Pleural effusion, not elsewhere classified: Secondary | ICD-10-CM | POA: Insufficient documentation

## 2017-01-24 LAB — CBC WITH DIFFERENTIAL/PLATELET
BASOS ABS: 0 10*3/uL (ref 0.0–0.1)
Basophils Relative: 0.5 % (ref 0.0–3.0)
EOS PCT: 1.2 % (ref 0.0–5.0)
Eosinophils Absolute: 0.1 10*3/uL (ref 0.0–0.7)
HCT: 41.8 % (ref 39.0–52.0)
HEMOGLOBIN: 13.8 g/dL (ref 13.0–17.0)
Lymphocytes Relative: 20.1 % (ref 12.0–46.0)
Lymphs Abs: 1.4 10*3/uL (ref 0.7–4.0)
MCHC: 33.1 g/dL (ref 30.0–36.0)
MCV: 98 fl (ref 78.0–100.0)
MONOS PCT: 12.2 % — AB (ref 3.0–12.0)
Monocytes Absolute: 0.8 10*3/uL (ref 0.1–1.0)
Neutro Abs: 4.6 10*3/uL (ref 1.4–7.7)
Neutrophils Relative %: 66 % (ref 43.0–77.0)
Platelets: 172 10*3/uL (ref 150.0–400.0)
RBC: 4.27 Mil/uL (ref 4.22–5.81)
RDW: 12.9 % (ref 11.5–15.5)
WBC: 7 10*3/uL (ref 4.0–10.5)

## 2017-01-24 LAB — D-DIMER, QUANTITATIVE: D-Dimer, Quant: 2.1 mcg/mL FEU — ABNORMAL HIGH (ref <0.50)

## 2017-01-24 LAB — BRAIN NATRIURETIC PEPTIDE: PRO B NATRI PEPTIDE: 295 pg/mL — AB (ref 0.0–100.0)

## 2017-01-24 MED ORDER — ROSUVASTATIN CALCIUM 20 MG PO TABS: 20.0000 mg | ORAL_TABLET | Freq: Every day | ORAL | 3 refills | Status: DC

## 2017-01-24 NOTE — Progress Notes (Signed)
Pre visit review using our clinic review tool, if applicable. No additional management support is needed unless otherwise documented below in the visit note. 

## 2017-01-24 NOTE — Progress Notes (Signed)
Subjective:    Patient ID: Gregory Guerrero, male    DOB: 24-Mar-1946, 71 y.o.   MRN: [id number]  DOS:  01/24/2017 Type of visit - description : New patient, to get established Interval history: Here to get established, I reviewed the chart and address all his chronic problems. Also, reports 4 weeks history of DOE, different from his baseline. When he tries to exercise he gets short of breath, feel somewhat sweaty. Symptoms started after 15 or 20 steps. Denies chest pain or palpitations. HTN: Not taken amlodipine. BP some previous visit elevated. Has a left leg wound, follow-[facility] they order ABIs. Results not available. CAD: Chart reviewed, has a stress test pending.   BP Readings from Last 3 Encounters:  01/24/17 (!) 152/80  01/04/17 (!) 150/80  07/26/16 (!) 160/90     Review of Systems  Denies fever chills And needs more than 30 pounds of weight gain over the last year, reports she has been quite inactive due to knee pain. No cough Has lower extremity edema, or the last couple weeks that edema has decreased. Denies cough or wheezing No recent airplane trip or prolonged car trips.  Past Medical History:  Diagnosis Date  . CAD (coronary artery disease)   . HTN (hypertension)   . MI (myocardial infarction) ([place])    1999    Past Surgical History:  Procedure Laterality Date  . ANTERIOR LAT LUMBAR FUSION N/A 03/01/2016   Procedure: EXTREME LATERAL INTERBODY FUSION  LUMBAR 2-4;  Surgeon: [name], MD;  Location: [facility];  Service: Orthopedics;  Laterality: N/A;  . CARDIAC CATHETERIZATION    . SPINAL FUSION N/A 03/02/2016   Procedure: POSTERIOR SPINAL FUSION INTERBODY L2-S1, DECOMPRESSION L4-S1;  Surgeon: [name], MD;  Location: [facility];  Service: Orthopedics;  Laterality: N/A;    Social History   Social History  . Marital status: Married    Spouse name: N/A  . Number of children: N/A  . Years of education: N/A   Occupational History  .  Not on file.   Social History Main Topics  . Smoking status: Former Smoker    Types: Cigarettes    Quit date: 04/26/1983  . Smokeless tobacco: Never Used  . Alcohol use Not on file  . Drug use: Unknown  . Sexual activity: Not on file   Other Topics Concern  . Not on file   Social History Narrative  . No narrative on file      Allergies as of 01/24/2017   No Known Allergies     Medication List       Accurate as of 01/24/17  2:20 PM. Always use your most recent med list.          amLODipine 10 MG tablet Commonly known as:  NORVASC TAKE ONE TABLET BY MOUTH ONCE DAILY   aspirin EC 81 MG tablet Take 81 mg by mouth daily.   losartan 100 MG tablet Commonly known as:  COZAAR Take 1 tablet (100 mg total) by mouth daily.   multivitamin with minerals Tabs tablet Take 1 tablet by mouth daily.   simvastatin 40 MG tablet Commonly known as:  ZOCOR TAKE ONE TABLET BY MOUTH DAILY   vitamin C 1000 MG tablet Take 1,000 mg by mouth daily.          Objective:   Physical Exam BP (!) 152/80 (BP Location: Left Arm, Patient Position: Sitting, Cuff Size: Small)   Pulse 80   Temp 97.7  F (36.5 C) (Oral)   Resp 14   Ht 5\' 8"  (1.727 m)   Wt 244 lb 6 oz (110.8 kg)   SpO2 98%   BMI 37.16 kg/m  General:   Well developed, well nourished . NAD.  HEENT:  Normocephalic . Face symmetric, atraumatic. Neck: No JVD at 45 Lungs:  CTA B Normal respiratory effort, no intercostal retractions, no accessory muscle use. Heart: RRR,  no murmur.  Lower extremities: Trace pretibial edema bilaterally  Right calf is 1 inch larger in circumference compared to the left. Both calves are soft and nontender. Abdomen:  Not distended, soft, non-tender. No rebound or rigidity.   Skin: Not pale. Not jaundice Neurologic:  alert & oriented X3.  Speech normal, gait appropriate for age and unassisted Psych--  Cognition and judgment appear intact.  Cooperative with normal attention span and  concentration.  Behavior appropriate. No anxious or depressed appearing.     Assessment & Plan:    Assessment HTN, dx ~ 2010 Hyperlipidemia CV: -CAD, history of remote MI -H/o DVT ~ 02/2016 after back surgery BCC, L face, last derm note 2014 Lower extremity ulcer, right leg after an injury H/o elevated PSA per urology note 2014, DRE normal, PSA was 3.7.    Plan: Labs 12/21/2016: Creatinine 1.43. Potassium 4.5 Cholesterol 167, HDL 49, LDL 100 Multiple issues addressed: HTN: not well-controlled, on  losartan but not taking amlodipine. See below. (Adding Lasix) Hyperlipidemia: Not well controlled, LDL 100. Change simvastatin to Crestor 20. CAD: Saw cardiology 01/04/2017, did not mention any difficulties, was felt to be asymptomatic, ETT schedule in few days. Now presents with DOE. See next DOE: Reports a definite change in status, DOE for 4 weeks. VSS except for elevated BP. History of CAD, DVT last year. No chest pain. Also history of 30 pound weight gain over the last year and inactivity. Deconditioning? EKG today nsr  Plan: D-dimer, [place] B LE (-) DVT, chest x-ray , CBC, BNP. Consider a VQ scan if d-dimer positive. Addendum:  -D-dimer +, CBC normal, BNP elevated, CXR trace B pleural effusions. -VQ scan was order: Normal. Patient called, spoke with the wife,suspect DOE is fluid overload given chest x-ray and BNP. Will stop amlodipine and start Lasix 20 mg one by mouth daily with close follow-up of kidney function. BMP in 1 week. Will notify cardiology. L LE  wound: : By [facility] ABIs were ordered, results not available  Knee pain: Reports he needs surgeries  Primary care: Had [place] immunization 05/2016, type? Never had a colonoscopy Had a flu shot yesterday RTC 3 weeks

## 2017-01-24 NOTE — Patient Instructions (Signed)
GO TO THE LAB : Get the blood work     GO TO THE FRONT DESK Schedule your next appointment for a  checkup in 3 weeks    STOP BY THE FIRST FLOOR:  get the XR , schedule a ultrasound of the legs to rule out a DVT  Start amlodipine daily  Stop simvastatin Start Crestor ER if chest pain or severe symptoms

## 2017-01-25 ENCOUNTER — Telehealth: Payer: Self-pay | Admitting: Internal Medicine

## 2017-01-25 ENCOUNTER — Ambulatory Visit (HOSPITAL_COMMUNITY)
Admission: RE | Admit: 2017-01-25 | Discharge: 2017-01-25 | Disposition: A | Discharge status: Home / Self Care | Payer: Medicare Other | Source: Ambulatory Visit | Attending: Internal Medicine | Admitting: Internal Medicine

## 2017-01-25 ENCOUNTER — Other Ambulatory Visit: Payer: Self-pay

## 2017-01-25 ENCOUNTER — Telehealth: Payer: Self-pay

## 2017-01-25 DIAGNOSIS — R7989 Other specified abnormal findings of blood chemistry: Secondary | ICD-10-CM

## 2017-01-25 DIAGNOSIS — R0602 Shortness of breath: Secondary | ICD-10-CM | POA: Insufficient documentation

## 2017-01-25 DIAGNOSIS — R0609 Other forms of dyspnea: Secondary | ICD-10-CM

## 2017-01-25 DIAGNOSIS — Z09 Encounter for follow-up examination after completed treatment for conditions other than malignant neoplasm: Secondary | ICD-10-CM | POA: Insufficient documentation

## 2017-01-25 DIAGNOSIS — I10 Essential (primary) hypertension: Secondary | ICD-10-CM

## 2017-01-25 MED ORDER — FUROSEMIDE 20 MG PO TABS: 20.0000 mg | ORAL_TABLET | Freq: Every day | ORAL | 1 refills | Status: DC

## 2017-01-25 MED ORDER — TECHNETIUM TO 99M ALBUMIN AGGREGATED
4.0000 | Freq: Once | INTRAVENOUS | Status: AC | PRN
  Administered 2017-01-25: 4 via INTRAVENOUS

## 2017-01-25 MED ORDER — TECHNETIUM TC 99M DIETHYLENETRIAME-PENTAACETIC ACID
30.0000 | Freq: Once | INTRAVENOUS | Status: AC | PRN
  Administered 2017-01-25: 30 via INTRAVENOUS

## 2017-01-25 NOTE — Telephone Encounter (Addendum)
Dr. Johnsie Cancel wants patient to have an echo and an appt with PA next week.   Patient is schedule for an echo (9:30) and appt with PA (11:30)  on 02/01/17.   In order to get patient an echo and an appt with PA, on the same day,  patient will need to get his lab work at our office. Patient is wanting to make the least amount of trips to the doctor's office. Will forward to Dr. Larose Kells and Dr. Johnsie Cancel to see if this is okay before ordering BMET at our office.

## 2017-01-25 NOTE — Assessment & Plan Note (Signed)
Labs 12/21/2016: Creatinine 1.43. Potassium 4.5 Cholesterol 167, HDL 49, LDL 100 Multiple issues addressed: HTN: not well-controlled, on  losartan but not taking amlodipine. See below. (Adding Lasix) Hyperlipidemia: Not well controlled, LDL 100. Change simvastatin to Crestor 20. CAD: Saw cardiology 01/04/2017, did not mention any difficulties, was felt to be asymptomatic, ETT schedule in few days. Now presents with DOE. See next DOE: Reports a definite change in status, DOE for 4 weeks. VSS except for elevated BP. History of CAD, DVT last year. No chest pain. Also history of 30 pound weight gain over the last year and inactivity. Deconditioning? EKG today nsr  Plan: D-dimer, [place] B LE (-) DVT, chest x-ray , CBC, BNP. Consider a VQ scan if d-dimer positive. Addendum:  -D-dimer +, CBC normal, BNP elevated, CXR trace B pleural effusions. -VQ scan was order: Normal. Patient called, spoke with the wife,suspect DOE is fluid overload given chest x-ray and BNP. Will stop amlodipine and start Lasix 20 mg one by mouth daily with close follow-up of kidney function. BMP in 1 week. Will notify cardiology. L LE  wound: : [facility] ABIs were ordered, results not available  Knee pain: Reports he needs surgeries  Primary care: Had [place] immunization 05/2016, type? Never had a colonoscopy Had a flu shot yesterday RTC 3 weeks

## 2017-01-25 NOTE — Telephone Encounter (Signed)
He has a long commute so can get echo and labs at our office and see Pa at our office

## 2017-01-25 NOTE — Telephone Encounter (Signed)
Received Abnormal lab results from [facility]; forwarded to provider/SLS 10/11  Provider is aware: Notes recorded by Colon Branch, MD on 01/25/2017 at 8:54 AM EDT See OV note

## 2017-01-25 NOTE — Telephone Encounter (Signed)
Patient has lab appt with our office on same day.

## 2017-01-26 DIAGNOSIS — I890 Lymphedema, not elsewhere classified: Secondary | ICD-10-CM | POA: Diagnosis not present

## 2017-01-26 DIAGNOSIS — L97822 Non-pressure chronic ulcer of other part of left lower leg with fat layer exposed: Secondary | ICD-10-CM | POA: Diagnosis not present

## 2017-01-26 DIAGNOSIS — I119 Hypertensive heart disease without heart failure: Secondary | ICD-10-CM | POA: Diagnosis not present

## 2017-01-26 DIAGNOSIS — I2510 Atherosclerotic heart disease of native coronary artery without angina pectoris: Secondary | ICD-10-CM | POA: Diagnosis not present

## 2017-01-26 DIAGNOSIS — Z86718 Personal history of other venous thrombosis and embolism: Secondary | ICD-10-CM | POA: Diagnosis not present

## 2017-01-26 DIAGNOSIS — I87311 Chronic venous hypertension (idiopathic) with ulcer of right lower extremity: Secondary | ICD-10-CM | POA: Diagnosis not present

## 2017-01-31 ENCOUNTER — Other Ambulatory Visit (HOSPITAL_COMMUNITY): Payer: Medicare Other

## 2017-02-01 ENCOUNTER — Ambulatory Visit (HOSPITAL_COMMUNITY): Payer: Medicare Other | Attending: Cardiovascular Disease

## 2017-02-01 ENCOUNTER — Other Ambulatory Visit: Payer: Medicare Other

## 2017-02-01 ENCOUNTER — Encounter: Payer: Self-pay | Admitting: Physician Assistant

## 2017-02-01 ENCOUNTER — Other Ambulatory Visit: Payer: Self-pay

## 2017-02-01 ENCOUNTER — Ambulatory Visit (INDEPENDENT_AMBULATORY_CARE_PROVIDER_SITE_OTHER): Payer: Medicare Other | Admitting: Physician Assistant

## 2017-02-01 ENCOUNTER — Other Ambulatory Visit: Payer: Medicare Other | Admitting: *Deleted

## 2017-02-01 VITALS — BP 130/78 | HR 71 | Resp 16 | Ht 68.0 in | Wt 239.0 lb

## 2017-02-01 DIAGNOSIS — Z955 Presence of coronary angioplasty implant and graft: Secondary | ICD-10-CM | POA: Insufficient documentation

## 2017-02-01 DIAGNOSIS — N183 Chronic kidney disease, stage 3 unspecified: Secondary | ICD-10-CM

## 2017-02-01 DIAGNOSIS — I119 Hypertensive heart disease without heart failure: Secondary | ICD-10-CM | POA: Diagnosis not present

## 2017-02-01 DIAGNOSIS — I340 Nonrheumatic mitral (valve) insufficiency: Secondary | ICD-10-CM | POA: Diagnosis not present

## 2017-02-01 DIAGNOSIS — I252 Old myocardial infarction: Secondary | ICD-10-CM | POA: Insufficient documentation

## 2017-02-01 DIAGNOSIS — Z8249 Family history of ischemic heart disease and other diseases of the circulatory system: Secondary | ICD-10-CM | POA: Diagnosis not present

## 2017-02-01 DIAGNOSIS — I10 Essential (primary) hypertension: Secondary | ICD-10-CM | POA: Diagnosis not present

## 2017-02-01 DIAGNOSIS — L97509 Non-pressure chronic ulcer of other part of unspecified foot with unspecified severity: Secondary | ICD-10-CM

## 2017-02-01 DIAGNOSIS — Z87891 Personal history of nicotine dependence: Secondary | ICD-10-CM | POA: Diagnosis not present

## 2017-02-01 DIAGNOSIS — I2510 Atherosclerotic heart disease of native coronary artery without angina pectoris: Secondary | ICD-10-CM

## 2017-02-01 DIAGNOSIS — R0609 Other forms of dyspnea: Secondary | ICD-10-CM | POA: Diagnosis not present

## 2017-02-01 DIAGNOSIS — R7989 Other specified abnormal findings of blood chemistry: Secondary | ICD-10-CM

## 2017-02-01 DIAGNOSIS — E11621 Type 2 diabetes mellitus with foot ulcer: Secondary | ICD-10-CM | POA: Diagnosis not present

## 2017-02-01 DIAGNOSIS — I82409 Acute embolism and thrombosis of unspecified deep veins of unspecified lower extremity: Secondary | ICD-10-CM | POA: Diagnosis not present

## 2017-02-01 DIAGNOSIS — R0600 Dyspnea, unspecified: Secondary | ICD-10-CM | POA: Diagnosis present

## 2017-02-01 LAB — BASIC METABOLIC PANEL
BUN/Creatinine Ratio: 17 (ref 10–24)
BUN: 26 mg/dL (ref 8–27)
CALCIUM: 9.6 mg/dL (ref 8.6–10.2)
CHLORIDE: 104 mmol/L (ref 96–106)
CO2: 24 mmol/L (ref 20–29)
Creatinine, Ser: 1.55 mg/dL — ABNORMAL HIGH (ref 0.76–1.27)
GFR calc Af Amer: 51 mL/min/{1.73_m2} — ABNORMAL LOW (ref >59)
GFR, EST NON AFRICAN AMERICAN: 44 mL/min/{1.73_m2} — AB (ref >59)
Glucose: 93 mg/dL (ref 65–99)
POTASSIUM: 4.6 mmol/L (ref 3.5–5.2)
Sodium: 142 mmol/L (ref 134–144)

## 2017-02-01 NOTE — Progress Notes (Signed)
Cardiology Office Note    Date:  02/01/2017   ID:  Mirko, Tailor [birth year], MRN [id number]  PCP:  Colon Branch, MD  Cardiologist:  Dr.Nishan   Chief Complaint: Dyspnea  History of Present Illness:   Gregory Guerrero is a 71 y.o. male with hx of CAD s/p stenting to LAD remotely @ [place] , DVT, HTN, HLD and CRF presents for dyspnea.   Normal ETT 02/2015. Last seen by Dr. Johnsie Cancel 01/04/13. LLE wound--> follow up study showed normal ABIs.   Patient was seen by PCP 01/24/17 with complain of few weeks of DOE. Worsen from baseline. BNP and D-dimer were elevated. No evidence of DVT on doppler and PE on VQ scan.   Dr. Johnsie Cancel recommended Echo (done today) and APP follow up. Pending ETT next month. Patient has noted some improvement on dyspnea on exertion with lasix. No chest pain/tightness. Symptoms not similar to prior angina. No orthopnea, PND, syncope or melena. Has some LE edema.    Past Medical History:  Diagnosis Date  . CAD (coronary artery disease)   . DVT (deep venous thrombosis) ([place])    following spinal surgery Nov 2017  . HTN (hypertension)   . MI (myocardial infarction) ([place])    1999    Past Surgical History:  Procedure Laterality Date  . ANTERIOR LAT LUMBAR FUSION N/A 03/01/2016   Procedure: EXTREME LATERAL INTERBODY FUSION  LUMBAR 2-4;  Surgeon: [name], MD;  Location: [facility];  Service: Orthopedics;  Laterality: N/A;  . CARDIAC CATHETERIZATION    . SPINAL FUSION N/A 03/02/2016   Procedure: POSTERIOR SPINAL FUSION INTERBODY L2-S1, DECOMPRESSION L4-S1;  Surgeon: [name], MD;  Location: [facility];  Service: Orthopedics;  Laterality: N/A;    Current Medications: Prior to Admission medications   Medication Sig Start Date End Date Taking? Authorizing Provider  Ascorbic Acid (VITAMIN C) 1000 MG tablet Take 1,000 mg by mouth daily.    [provider]  aspirin EC 81 MG tablet Take 81 mg by mouth daily.    [provider]  furosemide  (LASIX) 20 MG tablet Take 1 tablet (20 mg total) by mouth daily. 01/25/17   Colon Branch, MD  losartan (COZAAR) 100 MG tablet Take 1 tablet (100 mg total) by mouth daily. 01/05/17   [name], MD  Multiple Vitamin (MULTIVITAMIN WITH MINERALS) TABS tablet Take 1 tablet by mouth daily.    [provider]  rosuvastatin (CRESTOR) 20 MG tablet Take 1 tablet (20 mg total) by mouth at bedtime. 01/24/17   Colon Branch, MD    Allergies:   Patient has no known allergies.   Social History   Social History  . Marital status: Married    Spouse name: N/A  . Number of children: 2  . Years of education: N/A   Occupational History  . retired , Pension scheme manager busines     Social History Main Topics  . Smoking status: Former Smoker    Types: Cigarettes    Quit date: 04/26/1983  . Smokeless tobacco: Never Used  . Alcohol use None  . Drug use: Unknown  . Sexual activity: Not Asked   Other Topics Concern  . None   Social History Narrative   Household pt, wife, a Pharmacologist from [place]      Family History:  The patient's family history includes Cancer - Other in his mother; Heart attack in his father; Uterine cancer in his sister.  ROS:   Please see the history of present illness.    ROS All other systems reviewed and are negative.   PHYSICAL EXAM:   VS:  BP 130/78   Pulse 71   Resp 16   Ht 5\' 8"  (1.727 m)   Wt 239 lb (108.4 kg)   SpO2 97%   BMI 36.34 kg/m    GEN: Well nourished, well developed, in no acute distress  HEENT: normal  Neck: no JVD, carotid bruits, or masses Cardiac: RRR; no murmurs, rubs, or gallops, Trace BL LE edema, LLE wound Respiratory:  clear to auscultation bilaterally, normal work of breathing GI: soft, nontender, nondistended, + BS MS: no deformity or atrophy  Skin: warm and dry, no rash Neuro:  Alert and Oriented x 3, Strength and sensation are intact Psych: euthymic mood, full affect  Wt Readings from Last 3 Encounters:  02/01/17 239 lb (108.4  kg)  01/24/17 244 lb 6 oz (110.8 kg)  01/04/17 239 lb (108.4 kg)      Studies/Labs Reviewed:   EKG:  EKG is not ordered today.    Recent Labs: 01/19/2017: BUN 21; Creatinine, Ser 1.44; Potassium 4.4; Sodium 142 01/24/2017: Hemoglobin 13.8; Platelets 172.0; Pro B Natriuretic peptide (BNP) 295.0   Lipid Panel    Component Value Date/Time   CHOL 178 06/24/2013 1113   TRIG 81.0 06/24/2013 1113   HDL 46.80 06/24/2013 1113   CHOLHDL 4 06/24/2013 1113   VLDL 16.2 06/24/2013 1113   LDLCALC 115 (H) 06/24/2013 1113    Additional studies/ records that were reviewed today include:  AS above    ASSESSMENT & PLAN:    1. DOE - No DVT or PE on recent study. Pending reading of echo and ETT. Continue lasix at 20mg  qd as symptoms improving. BMET today.   2. CAD s/p remote LAD stenting -  No chest pain. Continue ASA and statin.   3. HTN - Stable and well controlled on current medications.  4. HLD - LDL 115 on 06/2013. Consider fasting lipid panel. Continue statin.   5. CKD stage III - pending BMET while on diuretics.     Medication Adjustments/Labs and Tests Ordered: Current medicines are reviewed at length with the patient today.  Concerns regarding medicines are outlined above.  Medication changes, Labs and Tests ordered today are listed in the Patient Instructions below. Patient Instructions  Medication Instructions:  Your physician recommends that you continue on your current medications as directed. Please refer to the Current Medication list given to you today.   Labwork: Lab work has already been scheduled today: BMET.   Testing/Procedures: Keep appointments  Follow-Up: Recall placed in system will get a phone or letter when it's time to come back in.   Any Other Special Instructions Will Be Listed Below (If Applicable).     If you need a refill on your cardiac medications before your next appointment, please call your pharmacy.       Jarrett Soho, PA  02/01/2017 11:03 AM    [facility] [place], [place], [place]  [address] Phone: [phone]; Fax: [phone]

## 2017-02-01 NOTE — Patient Instructions (Addendum)
Medication Instructions:  Your physician recommends that you continue on your current medications as directed. Please refer to the Current Medication list given to you today.   Labwork: Lab work has already been scheduled today: BMET.   Testing/Procedures: Keep appointments  Follow-Up: Recall placed in system will get a phone or letter when it's time to come back in.   Any Other Special Instructions Will Be Listed Below (If Applicable).     If you need a refill on your cardiac medications before your next appointment, please call your pharmacy.

## 2017-02-02 ENCOUNTER — Other Ambulatory Visit (HOSPITAL_COMMUNITY): Payer: Medicare Other

## 2017-02-02 DIAGNOSIS — M170 Bilateral primary osteoarthritis of knee: Secondary | ICD-10-CM | POA: Diagnosis not present

## 2017-02-02 DIAGNOSIS — M1712 Unilateral primary osteoarthritis, left knee: Secondary | ICD-10-CM | POA: Diagnosis not present

## 2017-02-02 DIAGNOSIS — M1711 Unilateral primary osteoarthritis, right knee: Secondary | ICD-10-CM | POA: Diagnosis not present

## 2017-02-05 NOTE — Progress Notes (Signed)
Echo with low normal EF f/u with me after stress test

## 2017-02-06 DIAGNOSIS — I119 Hypertensive heart disease without heart failure: Secondary | ICD-10-CM | POA: Diagnosis not present

## 2017-02-06 DIAGNOSIS — L97822 Non-pressure chronic ulcer of other part of left lower leg with fat layer exposed: Secondary | ICD-10-CM | POA: Diagnosis not present

## 2017-02-06 DIAGNOSIS — Z86718 Personal history of other venous thrombosis and embolism: Secondary | ICD-10-CM | POA: Diagnosis not present

## 2017-02-06 DIAGNOSIS — I87312 Chronic venous hypertension (idiopathic) with ulcer of left lower extremity: Secondary | ICD-10-CM | POA: Diagnosis not present

## 2017-02-06 DIAGNOSIS — I890 Lymphedema, not elsewhere classified: Secondary | ICD-10-CM | POA: Diagnosis not present

## 2017-02-06 DIAGNOSIS — I2510 Atherosclerotic heart disease of native coronary artery without angina pectoris: Secondary | ICD-10-CM | POA: Diagnosis not present

## 2017-02-07 ENCOUNTER — Telehealth: Payer: Self-pay | Admitting: *Deleted

## 2017-02-07 NOTE — Progress Notes (Signed)
Subjective:   Gregory Guerrero is a 71 y.o. male who presents for Medicare Annual/Subsequent preventive examination. Present with wife. Describes his health as "if I was well, I wouldn't be here".  Review of Systems:  No ROS.  Medicare Wellness Visit. Additional risk factors are reflected in the social history.  Cardiac Risk Factors include: advanced age (>28men, >7 women);dyslipidemia;hypertension;male gender;sedentary lifestyle;obesity (BMI >30kg/m2) Sleep patterns: pt states he wakes every 3 hrs to urinate. States he is also very anxious. Does not take anything for sleep. Often feels tired.  Male:   CCS-  declines   PSA- No results found for: PSA      Objective:    Vitals: BP (!) 161/96 (BP Location: Left Arm, Patient Position: Sitting, Cuff Size: Normal)   Pulse 69   Ht 5\' 8"  (1.727 m)   Wt 242 lb 6.4 oz (110 kg)   SpO2 97%   BMI 36.86 kg/m   Body mass index is 36.86 kg/m.  Tobacco History  Smoking Status  . Former Smoker  . Types: Cigarettes  . Quit date: 04/26/1983  Smokeless Tobacco  . Never Used     Counseling given: Not Answered   Past Medical History:  Diagnosis Date  . CAD (coronary artery disease)   . Chronic kidney disease   . DVT (deep venous thrombosis) ([facility])    following spinal surgery Nov 2017  . HTN (hypertension)   . MI (myocardial infarction) ([facility])    1999   Past Surgical History:  Procedure Laterality Date  . ANTERIOR LAT LUMBAR FUSION N/A 03/01/2016   Procedure: EXTREME LATERAL INTERBODY FUSION  LUMBAR 2-4;  Surgeon: [name], MD;  Location: [facility];  Service: Orthopedics;  Laterality: N/A;  . CARDIAC CATHETERIZATION    . SPINAL FUSION N/A 03/02/2016   Procedure: POSTERIOR SPINAL FUSION INTERBODY L2-S1, DECOMPRESSION L4-S1;  Surgeon: [name], MD;  Location: [facility];  Service: Orthopedics;  Laterality: N/A;   Family History  Problem Relation Age of Onset  . Cancer - Other Mother   . Heart attack Father   . Uterine cancer Sister    . Colon cancer Neg Hx   . Prostate cancer Neg Hx    History  Sexual Activity  . Sexual activity: No    Outpatient Encounter Prescriptions as of 02/14/2017  Medication Sig  . Ascorbic Acid (VITAMIN C) 1000 MG tablet Take 1,000 mg by mouth daily.  Marland Kitchen aspirin EC 81 MG tablet Take 81 mg by mouth daily.  . furosemide (LASIX) 20 MG tablet Take 1 tablet (20 mg total) by mouth daily.  Marland Kitchen losartan (COZAAR) 100 MG tablet Take 1 tablet (100 mg total) by mouth daily.  . Multiple Vitamin (MULTIVITAMIN WITH MINERALS) TABS tablet Take 1 tablet by mouth daily.  . rosuvastatin (CRESTOR) 20 MG tablet Take 1 tablet (20 mg total) by mouth at bedtime.   No facility-administered encounter medications on file as of 02/14/2017.     Activities of Daily Living In your present state of health, do you have any difficulty performing the following activities: 02/14/2017 01/24/2017  Hearing? Y N  Comment declines audiology -  Vision? N N  Difficulty concentrating or making decisions? N N  Walking or climbing stairs? Y N  Comment bilat bad knees per pt -  Dressing or bathing? N N  Doing errands, shopping? N N  Preparing Food and eating ? N -  Using the Toilet? N -  In the past six months, have you accidently leaked  urine? N -  Do you have problems with loss of bowel control? N -  Managing your Medications? N -  Managing your Finances? N -  Housekeeping or managing your Housekeeping? N -  Some recent data might be hidden    Patient Care Team: Colon Branch, MD as PCP - General (Internal Medicine) [name], MD as Consulting Physician (Cardiology)   Assessment:    Physical assessment deferred to PCP.  Exercise Activities and Dietary recommendations Current Exercise Habits: The patient does not participate in regular exercise at present, Exercise limited by: orthopedic condition(s) Diet (meal preparation, eat out, water intake, caffeinated beverages, dairy products, fruits and vegetables): in  general, an "unhealthy" diet     Goals    . Have knee surgery/ improve knee pain      Fall Risk Fall Risk  02/14/2017 01/24/2017  Falls in the past year? No No   Depression Screen PHQ 2/9 Scores 02/14/2017 01/24/2017  PHQ - 2 Score 0 0    Cognitive Function Ad8 score reviewed for issues:  Issues making decisions:no  Less interest in hobbies / activities:no  Repeats questions, stories (family complaining):no  Trouble using ordinary gadgets (microwave, computer, phone):no  Forgets the month or year: no  Mismanaging finances: no  Remembering appts:no  Daily problems with thinking and/or memory:no Ad8 score is=0        Immunization History  Administered Date(s) Administered  . Influenza-Unspecified 01/23/2017   Screening Tests Health Maintenance  Topic Date Due  . Hepatitis C Screening  03/12/1944  . FOOT EXAM  02/12/1954  . OPHTHALMOLOGY EXAM  02/12/1954  . COLONOSCOPY  02/12/1994  . PNA vac Low Risk Adult (1 of 2 - PCV13) 02/12/2009  . TETANUS/TDAP  01/24/2018 (Originally 02/13/1963)  . HEMOGLOBIN A1C  07/04/2017  . INFLUENZA VACCINE  Completed      Plan:   Follow up with PCP today as scheduled  Eat heart healthy diet (full of fruits, vegetables, whole grains, lean protein, water--limit salt, fat, and sugar intake) and increase physical activity as tolerated.  Continue doing brain stimulating activities (puzzles, reading, adult coloring books, staying active) to keep memory sharp.     I have personally reviewed and noted the following in the patient's chart:   . Medical and social history . Use of alcohol, tobacco or illicit drugs  . Current medications and supplements . Functional ability and status . Nutritional status . Physical activity . Advanced directives . List of other physicians . Hospitalizations, surgeries, and ER visits in previous 12 months . Vitals . Screenings to include cognitive, depression, and falls . Referrals and  appointments  In addition, I have reviewed and discussed with patient certain preventive protocols, quality metrics, and best practice recommendations. A written personalized care plan for preventive services as well as general preventive health recommendations were provided to patient.     Gregory Plummer Redbird, RN  02/14/2017  Kathlene November, MD

## 2017-02-07 NOTE — Telephone Encounter (Signed)
AWV w/ RN @115  on 02/14/17 prior to PCP appt

## 2017-02-14 ENCOUNTER — Ambulatory Visit (INDEPENDENT_AMBULATORY_CARE_PROVIDER_SITE_OTHER): Payer: Medicare Other | Admitting: Internal Medicine

## 2017-02-14 ENCOUNTER — Encounter: Payer: Self-pay | Admitting: Internal Medicine

## 2017-02-14 VITALS — BP 142/98 | HR 69 | Ht 68.0 in | Wt 242.4 lb

## 2017-02-14 DIAGNOSIS — I2510 Atherosclerotic heart disease of native coronary artery without angina pectoris: Secondary | ICD-10-CM | POA: Diagnosis not present

## 2017-02-14 DIAGNOSIS — Z0000 Encounter for general adult medical examination without abnormal findings: Secondary | ICD-10-CM | POA: Diagnosis not present

## 2017-02-14 DIAGNOSIS — R600 Localized edema: Secondary | ICD-10-CM | POA: Diagnosis not present

## 2017-02-14 DIAGNOSIS — R200 Anesthesia of skin: Secondary | ICD-10-CM

## 2017-02-14 DIAGNOSIS — I10 Essential (primary) hypertension: Secondary | ICD-10-CM

## 2017-02-14 MED ORDER — FUROSEMIDE 20 MG PO TABS: 20.0000 mg | ORAL_TABLET | Freq: Every day | ORAL | 5 refills | Status: DC

## 2017-02-14 MED ORDER — ATORVASTATIN CALCIUM 40 MG PO TABS: 40.0000 mg | ORAL_TABLET | Freq: Every day | ORAL | 3 refills | Status: DC

## 2017-02-14 NOTE — Progress Notes (Signed)
Subjective:    Patient ID: Gregory Guerrero, male    DOB: Dec 28, 1945, 71 y.o.   MRN: [id number]  DOS:  02/14/2017 Type of visit - description :f/u here w/ wife Interval history: DOE: See last visit, started Lasix, symptoms decreased to some extent.  Saw cardiology.  Note reviewed High cholesterol: Crestor is expensive, switch to Lipitor? New problem, 4 weeks history of steady numbness on all fingers, started on the left and now bilateral, from the knuckles down. Denies neck pain, no similar symptoms in the lower extremity. Thinks he has neuropathy.  BP Readings from Last 3 Encounters:  02/14/17 (!) 142/98  02/01/17 130/78  01/24/17 (!) 152/80     Review of Systems Since he started Lasix, he reports increasing urinary frequency, almost every hour.  Denies difficulty urinating, dysuria or blood in the stools. Edema improved after he started Lasix but now is getting swollen again.   Past Medical History:  Diagnosis Date  . CAD (coronary artery disease)   . Chronic kidney disease   . DVT (deep venous thrombosis) ([facility])    following spinal surgery Nov 2017  . HTN (hypertension)   . MI (myocardial infarction) ([facility] [place])    1999    Past Surgical History:  Procedure Laterality Date  . ANTERIOR LAT LUMBAR FUSION N/A 03/01/2016   Procedure: EXTREME LATERAL INTERBODY FUSION  LUMBAR 2-4;  Surgeon: [name], MD;  Location: [facility];  Service: Orthopedics;  Laterality: N/A;  . CARDIAC CATHETERIZATION    . SPINAL FUSION N/A 03/02/2016   Procedure: POSTERIOR SPINAL FUSION INTERBODY L2-S1, DECOMPRESSION L4-S1;  Surgeon: [name], MD;  Location: [facility];  Service: Orthopedics;  Laterality: N/A;    Social History   Social History  . Marital status: Married    Spouse name: N/A  . Number of children: 2  . Years of education: N/A   Occupational History  . retired , Pension scheme manager busines     Social History Main Topics  . Smoking status: Former Smoker    Types: Cigarettes    Quit date:  04/26/1983  . Smokeless tobacco: Never Used  . Alcohol use No  . Drug use: No  . Sexual activity: No   Other Topics Concern  . Not on file   Social History Narrative   Household pt, wife, a pet    Moved from [place] as of 02/14/2017   No Known Allergies     Medication List       Accurate as of 02/14/17 11:59 PM. Always use your most recent med list.          aspirin EC 81 MG tablet Take 81 mg by mouth daily.   atorvastatin 40 MG tablet Commonly known as:  LIPITOR Take 1 tablet (40 mg total) by mouth daily.   furosemide 20 MG tablet Commonly known as:  LASIX Take 1 tablet (20 mg total) by mouth daily.   losartan 100 MG tablet Commonly known as:  COZAAR Take 1 tablet (100 mg total) by mouth daily.   multivitamin with minerals Tabs tablet Take 1 tablet by mouth daily.   vitamin C 1000 MG tablet Take 1,000 mg by mouth daily.          Objective:   Physical Exam BP (!) 142/98 (BP Location: Left Arm, Patient Position: Sitting, Cuff Size: Small)   Pulse 69   Ht 5\' 8"  (1.727 m)   Wt 242 lb 6.4 oz (110 kg)  SpO2 97%   BMI 36.86 kg/m  General:   Well developed, well nourished . NAD.  HEENT:  Normocephalic . Face symmetric, atraumatic Lungs:  CTA B Normal respiratory effort, no intercostal retractions, no accessory muscle use. Heart: RRR,  no murmur.  +/+++ pretibial edema slt worse on the L  Skin: Not pale. Not jaundice Neurologic:  alert & oriented X3.  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  Motor and DTRs symmetric Psych--  Cognition and judgment appear intact.  Cooperative with normal attention span and concentration.  Behavior appropriate. No anxious or depressed appearing.      Assessment & Plan:   Assessment--- new patient 01/24/2017 HTN, dx ~ 2010 Hyperlipidemia CV: -CAD, history of remote MI -H/o DVT ~ 02/2016 after back surgery BCC, L face, last derm note 2014 Lower extremity ulcer, right leg after an  injury H/o elevated PSA per urology note 2014, DRE normal, PSA was 3.7.   Plan: DOE: Since the last visit, workup was negative for PE or DVT, was prescribed Lasix, he improved, saw cardiology, they agree with treatment.  Stress test pending.  Refill Lasix, pt  quite concerned about kidney function, recheck a BMP Lower extremity edema: Discussed importance of leg elevation and low-salt diet. Hyperlipidemia: Was not well controlled, simvastatin  changed to Crestor, it is expensive, likes to switch to Lipitor, will do ,  40 mg. HTN: Good med compliance, on losartan and Lasix, BP upon arrival to the office is slightly elevated, recheck 142/ 98.  Recheck a BMP, low-salt diet, self monitor amb BPs. Hand numbness: I agree with the patient, sx suggest a neuropathy, no history of diabetes.  No neck pain.  Will check a folic acid, A76 RPR and refer to neurology RTC 6 weeks, fasting.  Today, I spent more than  32  min with the patient: >50% of the time counseling regards a new problem, hand numbness, multiple questions regarding his recent labs, initiation of Lasix also discussed.

## 2017-02-14 NOTE — Patient Instructions (Addendum)
GO TO THE LAB : Get the blood work     GO TO THE FRONT DESK Schedule your next appointment for a checkup, fasting, in 6 weeks.    Check the  blood pressure 2 or 3 times a  week - Be sure your blood pressure is between 110/65 and  145/85. If it is consistently higher or lower, let me know   ----------------------------------------------- Eat heart healthy diet (full of fruits, vegetables, whole grains, lean protein, water--limit salt, fat, and sugar intake) and increase physical activity as tolerated.  Continue doing brain stimulating activities (puzzles, reading, adult coloring books, staying active) to keep memory sharp.    Gregory Guerrero , Thank you for taking time to come for your Medicare Wellness Visit. I appreciate your ongoing commitment to your health goals. Please review the following plan we discussed and let me know if I can assist you in the future.   These are the goals we discussed: Goals    . Have knee surgery/ improve knee pain       This is a list of the screening recommended for you and due dates:  Health Maintenance  Topic Date Due  .  Hepatitis C: One time screening is recommended by Center for Disease Control  (CDC) for  adults born from [birth year] through 1967.   08/03/1945  . Complete foot exam   02/12/1954  . Eye exam for diabetics  02/12/1954  . Colon Cancer Screening  02/12/1994  . Pneumonia vaccines (1 of 2 - PCV13) 02/12/2009  . Tetanus Vaccine  01/24/2018*  . Hemoglobin A1C  07/04/2017  . Flu Shot  Completed  *Topic was postponed. The date shown is not the original due date.    Health Maintenance, Male A healthy lifestyle and preventive care is important for your health and wellness. Ask your health care provider about what schedule of regular examinations is right for you. What should I know about weight and diet? Eat a Healthy Diet  Eat plenty of vegetables, fruits, whole grains, low-fat dairy products, and lean protein.  Do not eat a lot of foods  high in solid fats, added sugars, or salt.  Maintain a Healthy Weight Regular exercise can help you achieve or maintain a healthy weight. You should:  Do at least 150 minutes of exercise each week. The exercise should increase your heart rate and make you sweat (moderate-intensity exercise).  Do strength-training exercises at least twice a week.  Watch Your Levels of Cholesterol and Blood Lipids  Have your blood tested for lipids and cholesterol every 5 years starting at 73 years of age. If you are at high risk for heart disease, you should start having your blood tested when you are 73 years old. You may need to have your cholesterol levels checked more often if: ? Your lipid or cholesterol levels are high. ? You are older than 73 years of age. ? You are at high risk for heart disease.  What should I know about cancer screening? Many types of cancers can be detected early and may often be prevented. Lung Cancer  You should be screened every year for lung cancer if: ? You are a current smoker who has smoked for at least 30 years. ? You are a former smoker who has quit within the past 15 years.  Talk to your health care provider about your screening options, when you should start screening, and how often you should be screened.  Colorectal Cancer  Routine colorectal cancer screening  usually begins at 73 years of age and should be repeated every 5-10 years until you are 73 years old. You may need to be screened more often if early forms of precancerous polyps or small growths are found. Your health care provider may recommend screening at an earlier age if you have risk factors for colon cancer.  Your health care provider may recommend using home test kits to check for hidden blood in the stool.  A small camera at the end of a tube can be used to examine your colon (sigmoidoscopy or colonoscopy). This checks for the earliest forms of colorectal cancer.  Prostate and Testicular  Cancer  Depending on your age and overall health, your health care provider may do certain tests to screen for prostate and testicular cancer.  Talk to your health care provider about any symptoms or concerns you have about testicular or prostate cancer.  Skin Cancer  Check your skin from head to toe regularly.  Tell your health care provider about any new moles or changes in moles, especially if: ? There is a change in a mole's size, shape, or color. ? You have a mole that is larger than a pencil eraser.  Always use sunscreen. Apply sunscreen liberally and repeat throughout the day.  Protect yourself by wearing long sleeves, pants, a wide-brimmed hat, and sunglasses when outside.  What should I know about heart disease, diabetes, and high blood pressure?  If you are 93-14 years of age, have your blood pressure checked every 3-5 years. If you are 70 years of age or older, have your blood pressure checked every year. You should have your blood pressure measured twice-once when you are at a hospital or clinic, and once when you are not at a hospital or clinic. Record the average of the two measurements. To check your blood pressure when you are not at a hospital or clinic, you can use: ? An automated blood pressure machine at a pharmacy. ? A home blood pressure monitor.  Talk to your health care provider about your target blood pressure.  If you are between 59-77 years old, ask your health care provider if you should take aspirin to prevent heart disease.  Have regular diabetes screenings by checking your fasting blood sugar level. ? If you are at a normal weight and have a low risk for diabetes, have this test once every three years after the age of 59. ? If you are overweight and have a high risk for diabetes, consider being tested at a younger age or more often.  A one-time screening for abdominal aortic aneurysm (AAA) by ultrasound is recommended for men aged 19-75 years who are  current or former smokers. What should I know about preventing infection? Hepatitis B If you have a higher risk for hepatitis B, you should be screened for this virus. Talk with your health care provider to find out if you are at risk for hepatitis B infection. Hepatitis C Blood testing is recommended for:  Everyone born from [birth year] through 1965.  Anyone with known risk factors for hepatitis C.  Sexually Transmitted Diseases (STDs)  You should be screened each year for STDs including gonorrhea and chlamydia if: ? You are sexually active and are younger than 73 years of age. ? You are older than 73 years of age and your health care provider tells you that you are at risk for this type of infection. ? Your sexual activity has changed since you were last screened and  you are at an increased risk for chlamydia or gonorrhea. Ask your health care provider if you are at risk.  Talk with your health care provider about whether you are at high risk of being infected with HIV. Your health care provider may recommend a prescription medicine to help prevent HIV infection.  What else can I do?  Schedule regular health, dental, and eye exams.  Stay current with your vaccines (immunizations).  Do not use any tobacco products, such as cigarettes, chewing tobacco, and e-cigarettes. If you need help quitting, ask your health care provider.  Limit alcohol intake to no more than 2 drinks per day. One drink equals 12 ounces of beer, 5 ounces of wine, or 1 ounces of hard liquor.  Do not use street drugs.  Do not share needles.  Ask your health care provider for help if you need support or information about quitting drugs.  Tell your health care provider if you often feel depressed.  Tell your health care provider if you have ever been abused or do not feel safe at home. This information is not intended to replace advice given to you by your health care provider. Make sure you discuss any questions  you have with your health care provider. Document Released: 09/30/2007 Document Revised: 12/01/2015 Document Reviewed: 01/05/2015 Elsevier Interactive Patient Education  [employer].

## 2017-02-15 DIAGNOSIS — R600 Localized edema: Secondary | ICD-10-CM | POA: Insufficient documentation

## 2017-02-15 LAB — BASIC METABOLIC PANEL
BUN: 36 mg/dL — ABNORMAL HIGH (ref 6–23)
CO2: 29 mEq/L (ref 19–32)
Calcium: 9.6 mg/dL (ref 8.4–10.5)
Chloride: 108 mEq/L (ref 96–112)
Creatinine, Ser: 1.52 mg/dL — ABNORMAL HIGH (ref 0.40–1.50)
GFR: 48.02 mL/min — AB (ref >60.00)
GLUCOSE: 82 mg/dL (ref 70–99)
POTASSIUM: 4.4 meq/L (ref 3.5–5.1)
SODIUM: 143 meq/L (ref 135–145)

## 2017-02-15 LAB — VITAMIN B12: Vitamin B-12: 611 pg/mL (ref 211–911)

## 2017-02-15 LAB — FOLATE

## 2017-02-15 LAB — RPR: RPR: NONREACTIVE

## 2017-02-15 NOTE — Assessment & Plan Note (Signed)
DOE: Since the last visit, workup was negative for PE or DVT, was prescribed Lasix, he improved, saw cardiology, they agree with treatment.  Stress test pending.  Refill Lasix, pt  quite concerned about kidney function, recheck a BMP Lower extremity edema: Discussed importance of leg elevation and low-salt diet. Hyperlipidemia: Was not well controlled, simvastatin  changed to Crestor, it is expensive, likes to switch to Lipitor, will do ,  40 mg. HTN: Good med compliance, on losartan and Lasix, BP upon arrival to the office is slightly elevated, recheck 142/ 98.  Recheck a BMP, low-salt diet, self monitor amb BPs. Hand numbness: I agree with the patient, sx suggest a neuropathy, no history of diabetes.  No neck pain.  Will check a folic acid, M38 RPR and refer to neurology RTC 6 weeks, fasting.

## 2017-02-18 ENCOUNTER — Encounter: Payer: Self-pay | Admitting: Internal Medicine

## 2017-02-18 NOTE — Telephone Encounter (Signed)
error 

## 2017-02-21 ENCOUNTER — Other Ambulatory Visit: Payer: Self-pay | Admitting: Orthopedic Surgery

## 2017-02-21 DIAGNOSIS — Z981 Arthrodesis status: Secondary | ICD-10-CM

## 2017-02-22 ENCOUNTER — Other Ambulatory Visit: Payer: Self-pay | Admitting: Orthopedic Surgery

## 2017-02-22 ENCOUNTER — Ambulatory Visit
Admission: RE | Admit: 2017-02-22 | Discharge: 2017-02-22 | Disposition: A | Discharge status: Home / Self Care | Payer: Medicare Other | Source: Ambulatory Visit | Attending: Orthopedic Surgery | Admitting: Orthopedic Surgery

## 2017-02-22 DIAGNOSIS — M545 Low back pain: Secondary | ICD-10-CM | POA: Diagnosis not present

## 2017-02-22 DIAGNOSIS — Z981 Arthrodesis status: Secondary | ICD-10-CM

## 2017-02-22 MED ORDER — IOPAMIDOL (ISOVUE-M 200) INJECTION 41%
1.0000 mL | Freq: Once | INTRAMUSCULAR | Status: AC
  Administered 2017-02-22: 1 mL via EPIDURAL

## 2017-02-22 MED ORDER — METHYLPREDNISOLONE ACETATE 40 MG/ML INJ SUSP (RADIOLOG
120.0000 mg | Freq: Once | INTRAMUSCULAR | Status: AC
  Administered 2017-02-22: 120 mg via EPIDURAL

## 2017-02-22 NOTE — Progress Notes (Signed)
Cardiology Office Note    Date:  02/23/2017   ID:  ANDRES BANTZ, DOB Sep 13, 1945, MRN [id number]  PCP:  Colon Branch, MD  Cardiologist:  Dr.Perrion Diesel   Chief Complaint: Dyspnea  History of Present Illness:   Gregory Guerrero is a 71 y.o. male with hx of CAD s/p stenting to LAD remotely @ [facility] , DVT, HTN, HLD and CRF with recent dyspnea   Has chronic venous disease worse on right side with ulcers and edema  Patient was seen by PCP 01/24/17 with complain of few weeks of DOE. Worsen from baseline. BNP and D-dimer were elevated. No evidence of DVT on doppler and PE on VQ scan.   Started on lasix with improvement Echo 02/01/17 reviewed  EF 54%OEVOJ 2 diastolic mild MR severe bi atrial enlargement  ETT 02/23/17 HTN, PVCs non ischemic and no chest pain  Concerned about progressive neuropathy in both upper extremities. No motor deficits Concerned he has cervical neck issue and can't see [name] until December   Past Medical History:  Diagnosis Date  . CAD (coronary artery disease)   . Chronic kidney disease   . DVT (deep venous thrombosis) ([place])    following spinal surgery Nov 2017  . HTN (hypertension)   . MI (myocardial infarction) ([place])    1999    Past Surgical History:  Procedure Laterality Date  . CARDIAC CATHETERIZATION      Current Medications: Prior to Admission medications   Medication Sig Start Date End Date Taking? Authorizing Provider  Ascorbic Acid (VITAMIN C) 1000 MG tablet Take 1,000 mg by mouth daily.    [provider]  aspirin EC 81 MG tablet Take 81 mg by mouth daily.    [provider]  furosemide (LASIX) 20 MG tablet Take 1 tablet (20 mg total) by mouth daily. 01/25/17   Colon Branch, MD  losartan (COZAAR) 100 MG tablet Take 1 tablet (100 mg total) by mouth daily. 01/05/17   [name], MD  Multiple Vitamin (MULTIVITAMIN WITH MINERALS) TABS tablet Take 1 tablet by mouth daily.    [provider]  rosuvastatin  (CRESTOR) 20 MG tablet Take 1 tablet (20 mg total) by mouth at bedtime. 01/24/17   Colon Branch, MD    Allergies:   Patient has no known allergies.   Social History   Socioeconomic History  . Marital status: Married    Spouse name: None  . Number of children: 2  . Years of education: None  . Highest education level: None  Social Needs  . Financial resource strain: None  . Food insecurity - worry: None  . Food insecurity - inability: None  . Transportation needs - medical: None  . Transportation needs - non-medical: None  Occupational History  . Occupation: retired , Pension scheme manager busines   Tobacco Use  . Smoking status: Former Smoker    Types: Cigarettes    Last attempt to quit: 04/26/1983    Years since quitting: 33.8  . Smokeless tobacco: Never Used  Substance and Sexual Activity  . Alcohol use: No  . Drug use: No  . Sexual activity: No  Other Topics Concern  . None  Social History Narrative   Household pt, wife, a Pharmacologist from [place]      Family History:  The patient's family history includes Cancer - Other in his mother; Heart attack in his father; Uterine cancer in his sister.  ROS:   Please  see the history of present illness.    ROS All other systems reviewed and are negative.   PHYSICAL EXAM:   VS:  BP (!) 154/84   Pulse 93   Ht 5\' 8"  (1.727 m)   Wt 239 lb 8 oz (108.6 kg)   SpO2 96%   BMI 36.42 kg/m    Affect appropriate Healthy:  appears stated age 68: normal Neck supple with no adenopathy JVP normal no bruits no thyromegaly Lungs clear with no wheezing and good diaphragmatic motion Heart:  S1/S2 no murmur, no rub, gallop or click PMI normal Abdomen: benighn, BS positve, no tenderness, no AAA no bruit.  No HSM or HJR Distal pulses intact with no bruits Plus one RLE edema Neuro non-focal Skin warm and dry No muscular weakness   Wt Readings from Last 3 Encounters:  02/23/17 239 lb 8 oz (108.6 kg)  02/14/17 242 lb 6.4 oz (110 kg)    02/01/17 239 lb (108.4 kg)      Studies/Labs Reviewed:   EKG:  EKG is not ordered today.    Recent Labs: 01/24/2017: Hemoglobin 13.8; Platelets 172.0; Pro B Natriuretic peptide (BNP) 295.0 02/14/2017: BUN 36; Creatinine, Ser 1.52; Potassium 4.4; Sodium 143   Lipid Panel    Component Value Date/Time   CHOL 178 06/24/2013 1113   TRIG 81.0 06/24/2013 1113   HDL 46.80 06/24/2013 1113   CHOLHDL 4 06/24/2013 1113   VLDL 16.2 06/24/2013 1113   LDLCALC 115 (H) 06/24/2013 1113    Additional studies/ records that were reviewed today include:  AS above    ASSESSMENT & PLAN:    1. DOE - No DVT or PE on recent study. Echo with low normal EF but grade 2 diastolic dysfunction  - some improvement with lasix   2. CAD s/p remote LAD stenting -  No chest pain. Continue ASA and statin. ETT 02/23/17 non ischemic Add lopressor 25 bid For HTN, PVCls and pre op.  Ok to have right TKR with [facility] next month   3. HTN - Stable and well controlled on current medications.  4. HLD - LDL 115 on 06/2013. Consider fasting lipid panel. Continue statin.   5. CKD stage III slight worsening on current dose of diuretic   6. Neuropathy:  Needs MRI cervical spine will see if Dr Carles Collet or [facility] can see sooner  -  Lab Results  Component Value Date   CREATININE 1.52 (H) 02/14/2017   BUN 36 (H) 02/14/2017   NA 143 02/14/2017   K 4.4 02/14/2017   CL 108 02/14/2017   CO2 29 02/14/2017       Medication Adjustments/Labs and Tests Ordered: Current medicines are reviewed at length with the patient today.  Concerns regarding medicines are outlined above.  Medication changes, Labs and Tests ordered today are listed in the Patient Instructions below. Patient Instructions  Medication Instructions:  Your physician has recommended you make the following change in your medication:  1-START Metoprolol 25 mg by mouth twice daily  Labwork: NONE  Testing/Procedures: NONE  Follow-Up: Your  physician wants you to follow-up in: 3 months with Dr. Johnsie Cancel.   If you need a refill on your cardiac medications before your next appointment, please call your pharmacy.       Signed, Jenkins Rouge, MD  02/23/2017 12:35 PM    [facility] [address], [place], [place]  [address] Phone: [phone]; Fax: [phone]

## 2017-02-22 NOTE — Discharge Instructions (Signed)

## 2017-02-23 ENCOUNTER — Ambulatory Visit (INDEPENDENT_AMBULATORY_CARE_PROVIDER_SITE_OTHER): Payer: Medicare Other

## 2017-02-23 ENCOUNTER — Encounter: Payer: Self-pay | Admitting: Cardiovascular Disease

## 2017-02-23 ENCOUNTER — Encounter: Payer: Self-pay | Admitting: Neurology

## 2017-02-23 ENCOUNTER — Ambulatory Visit (INDEPENDENT_AMBULATORY_CARE_PROVIDER_SITE_OTHER): Payer: Medicare Other | Admitting: Cardiovascular Disease

## 2017-02-23 ENCOUNTER — Telehealth: Payer: Self-pay

## 2017-02-23 VITALS — BP 154/84 | HR 93 | Ht 68.0 in | Wt 239.5 lb

## 2017-02-23 DIAGNOSIS — R0609 Other forms of dyspnea: Secondary | ICD-10-CM

## 2017-02-23 DIAGNOSIS — I2510 Atherosclerotic heart disease of native coronary artery without angina pectoris: Secondary | ICD-10-CM

## 2017-02-23 DIAGNOSIS — I10 Essential (primary) hypertension: Secondary | ICD-10-CM

## 2017-02-23 DIAGNOSIS — E785 Hyperlipidemia, unspecified: Secondary | ICD-10-CM | POA: Diagnosis not present

## 2017-02-23 LAB — EXERCISE TOLERANCE TEST
Estimated workload: 7 METS
Exercise duration (min): 5 min
Exercise duration (sec): 45 s
MPHR: 147 {beats}/min
Peak HR: 126 {beats}/min
Percent HR: 85 %
RPE: 16
Rest HR: 70 {beats}/min

## 2017-02-23 MED ORDER — METOPROLOL TARTRATE 25 MG PO TABS: 25.0000 mg | ORAL_TABLET | Freq: Two times a day (BID) | ORAL | 3 refills | Status: DC

## 2017-02-23 NOTE — Patient Instructions (Addendum)
Medication Instructions:  Your physician has recommended you make the following change in your medication:  1-START Metoprolol 25 mg by mouth twice daily  Labwork: NONE  Testing/Procedures: NONE  Follow-Up: Your physician wants you to follow-up in: 3 months with Dr. Johnsie Cancel.   If you need a refill on your cardiac medications before your next appointment, please call your pharmacy.

## 2017-02-23 NOTE — Telephone Encounter (Signed)
Routed office note and ETT results to Dr. Wynelle Link.    ===View-only below this line===  ----- Message ----- From: [name], MD Sent: 02/23/2017  12:38 PM To: Michaelyn Barter, RN  Ok for right TKR had normal ETT today

## 2017-02-27 DIAGNOSIS — I119 Hypertensive heart disease without heart failure: Secondary | ICD-10-CM | POA: Diagnosis not present

## 2017-02-27 DIAGNOSIS — I890 Lymphedema, not elsewhere classified: Secondary | ICD-10-CM | POA: Diagnosis not present

## 2017-02-27 DIAGNOSIS — I2510 Atherosclerotic heart disease of native coronary artery without angina pectoris: Secondary | ICD-10-CM | POA: Diagnosis not present

## 2017-02-27 DIAGNOSIS — L97822 Non-pressure chronic ulcer of other part of left lower leg with fat layer exposed: Secondary | ICD-10-CM | POA: Diagnosis not present

## 2017-02-27 DIAGNOSIS — Z86718 Personal history of other venous thrombosis and embolism: Secondary | ICD-10-CM | POA: Diagnosis not present

## 2017-02-27 DIAGNOSIS — L97921 Non-pressure chronic ulcer of unspecified part of left lower leg limited to breakdown of skin: Secondary | ICD-10-CM | POA: Diagnosis not present

## 2017-03-01 ENCOUNTER — Ambulatory Visit (INDEPENDENT_AMBULATORY_CARE_PROVIDER_SITE_OTHER): Payer: Medicare Other | Admitting: Neurology

## 2017-03-01 ENCOUNTER — Encounter: Payer: Self-pay | Admitting: Neurology

## 2017-03-01 ENCOUNTER — Other Ambulatory Visit: Payer: Medicare Other

## 2017-03-01 VITALS — BP 170/112 | HR 72 | Ht 68.0 in | Wt 220.8 lb

## 2017-03-01 DIAGNOSIS — I2510 Atherosclerotic heart disease of native coronary artery without angina pectoris: Secondary | ICD-10-CM | POA: Diagnosis not present

## 2017-03-01 DIAGNOSIS — R200 Anesthesia of skin: Secondary | ICD-10-CM

## 2017-03-01 DIAGNOSIS — I10 Essential (primary) hypertension: Secondary | ICD-10-CM

## 2017-03-01 DIAGNOSIS — R292 Abnormal reflex: Secondary | ICD-10-CM

## 2017-03-01 MED ORDER — GABAPENTIN 300 MG PO CAPS: 300.0000 mg | ORAL_CAPSULE | Freq: Three times a day (TID) | ORAL | 2 refills | Status: DC

## 2017-03-01 NOTE — Progress Notes (Signed)
NEUROLOGY CONSULTATION NOTE  Gregory Guerrero MRN: [id number] DOB: 17-Sep-1945  Referring provider: Dr. Johnsie Cancel Primary care provider: Dr. Larose Kells  Reason for consult:  Bilateral hand numbness  HISTORY OF PRESENT ILLNESS: Gregory Guerrero is a 71 year old right-handed male with CAD status post stenting, hypertension, hyperlipidemia, CKD and history of DVT who presents for bilateral upper extremity.  He is accompanied by his wife who supplements history.  About 4 to 6 weeks ago, he developed numbness and tingling in the fingers of his left hand.  1 to 2 weeks later, he developed the same symptoms in his right hand.  Over the past month, the numbness and tingling has spread up to the wrists, involving the entire hand.  Due to the numbness and paresthesias, he has difficulty holding objects or buttoning his shirt.  He denies numbness and tingling in the feet.  He denies neck pain or radicular pain down the arms.  He has history of shoulder problems and reports pain in his right shoulder.  He denies visual disturbance, unilateral weakness or gait instability.  He wears dentures and stopped using denture adhesive cream about 2 weeks ago due to zinc ingredient.  He has history of lumbar stenosis status post L5-S1 surgery and requires surgery on his knees.  Labs from 02/14/17 include:  B12 611; folate over 23.9; RPR non-reactive; BMP with Na 143, K 4.4, Cl 108, CO2 29, glucose 82, BUN 36, Cr 1.52 and GFR 48.02.  PAST MEDICAL HISTORY: Past Medical History:  Diagnosis Date  . CAD (coronary artery disease)   . Chronic kidney disease   . DVT (deep venous thrombosis) ([place])    following spinal surgery Nov 2017  . HTN (hypertension)   . MI (myocardial infarction) ([place])    1999    PAST SURGICAL HISTORY: Past Surgical History:  Procedure Laterality Date  . ANTERIOR LAT LUMBAR FUSION N/A 03/01/2016   Procedure: EXTREME LATERAL INTERBODY FUSION  LUMBAR 2-4;  Surgeon: [name], MD;  Location: [facility];   Service: Orthopedics;  Laterality: N/A;  . CARDIAC CATHETERIZATION    . SPINAL FUSION N/A 03/02/2016   Procedure: POSTERIOR SPINAL FUSION INTERBODY L2-S1, DECOMPRESSION L4-S1;  Surgeon: [name], MD;  Location: [facility];  Service: Orthopedics;  Laterality: N/A;    MEDICATIONS: Current Outpatient Medications on File Prior to Visit  Medication Sig Dispense Refill  . Ascorbic Acid (VITAMIN C) 1000 MG tablet Take 1,000 mg by mouth daily.    Marland Kitchen aspirin EC 81 MG tablet Take 81 mg by mouth daily.    Marland Kitchen atorvastatin (LIPITOR) 40 MG tablet Take 1 tablet (40 mg total) by mouth daily. 30 tablet 3  . furosemide (LASIX) 20 MG tablet Take 1 tablet (20 mg total) by mouth daily. 30 tablet 5  . losartan (COZAAR) 100 MG tablet Take 1 tablet (100 mg total) by mouth daily. 90 tablet 3  . metoprolol tartrate (LOPRESSOR) 25 MG tablet Take 1 tablet (25 mg total) 2 (two) times daily by mouth. 180 tablet 3  . Multiple Vitamin (MULTIVITAMIN WITH MINERALS) TABS tablet Take 1 tablet by mouth daily.     No current facility-administered medications on file prior to visit.     ALLERGIES: No Known Allergies  FAMILY HISTORY: Family History  Problem Relation Age of Onset  . Cancer - Other Mother   . Heart attack Father   . Uterine cancer Sister   . Colon cancer Neg Hx   . Prostate cancer Neg Hx  SOCIAL HISTORY: Social History   Socioeconomic History  . Marital status: Married    Spouse name: Not on file  . Number of children: 2  . Years of education: Not on file  . Highest education level: Not on file  Social Needs  . Financial resource strain: Not on file  . Food insecurity - worry: Not on file  . Food insecurity - inability: Not on file  . Transportation needs - medical: Not on file  . Transportation needs - non-medical: Not on file  Occupational History  . Occupation: retired , Pension scheme manager busines   Tobacco Use  . Smoking status: Former Smoker    Types: Cigarettes    Last attempt to quit:  04/26/1983    Years since quitting: 33.8  . Smokeless tobacco: Never Used  Substance and Sexual Activity  . Alcohol use: No  . Drug use: No  . Sexual activity: No  Other Topics Concern  . Not on file  Social History Narrative   Household pt, wife, a Pharmacologist from [place]: Constitutional: No fevers, chills, or sweats, no generalized fatigue, change in appetite Eyes: No visual changes, double vision, eye pain Ear, nose and throat: No hearing loss, ear pain, nasal congestion, sore throat Cardiovascular: No chest pain, palpitations Respiratory:  No shortness of breath at rest or with exertion, wheezes GastrointestinaI: No nausea, vomiting, diarrhea, abdominal pain, fecal incontinence Genitourinary:  No dysuria, urinary retention or frequency Musculoskeletal:  No neck pain, back pain Integumentary: No rash, pruritus, skin lesions Neurological: as above Psychiatric: No depression, insomnia, anxiety Endocrine: No palpitations, fatigue, diaphoresis, mood swings, change in appetite, change in weight, increased thirst Hematologic/Lymphatic:  No purpura, petechiae. Allergic/Immunologic: no itchy/runny eyes, nasal congestion, recent allergic reactions, rashes  PHYSICAL EXAM: Vitals:   03/01/17 1404  BP: (!) 170/112  Pulse: 72  SpO2: 98%   General: No acute distress.  Patient appears well-groomed.  Head:  Normocephalic/atraumatic Eyes:  fundi examined but not visualized Neck: supple, no paraspinal tenderness, full range of motion Back: No paraspinal tenderness Heart: regular rate and rhythm Lungs: Clear to auscultation bilaterally. Vascular: No carotid bruits. Neurological Exam: Mental status: alert and oriented to person, place, and time, recent and remote memory intact, fund of knowledge intact, attention and concentration intact, speech fluent and not dysarthric, language intact. Cranial nerves: CN I: not tested CN II: pupils equal, round and reactive to  light, visual fields intact CN III, IV, VI:  full range of motion, no nystagmus, no ptosis CN V: facial sensation intact CN VII: upper and lower face symmetric CN VIII: hearing intact CN IX, X: gag intact, uvula midline CN XI: sternocleidomastoid and trapezius muscles intact CN XII: tongue midline Bulk & Tone: normal, no fasciculations. Motor:  4+/5 right deltoid, 5-/5 right triceps, otherwise 5/5 Sensation:  Pinprick sensation reduced in both entire hands as well as the dorsum of the right foot up the lateral right lower leg, and vibration sensation reduced in right foot. Deep Tendon Reflexes:  3+ throughout except absent in ankles, Hoffman and Babinski sign absent. Finger to nose testing:  Without dysmetria.  Heel to shin:  Without dysmetria.  Gait:  Wide-based.  Able to turn, unsteady tandem gait.  Romberg negative.  IMPRESSION: Bilateral hand numbness.  Given asymmetric presentation, less likely to be a peripheral neuropathy or radiculopathy.  He has brisk reflexes which may be normal for him, but must consider cervical myelopathy.  He has  numbness in the right lower extremity in the L5 distribution, likely chronic from prior stenosis.  PLAN: 1.  Check MRI of cervical spine with and without contrast to evaluate for evidence of myelopathy/stenosis. 2.  He has concern that the zinc in his denture adhesive may be contributing to his symptoms.  We will check zinc and copper levels. 3.  If testing unremarkable, would consider NCV-EMG of upper extremities. 4.  For paresthesias/discomfort, we will start gabapentin 300mg  three times daily. 5.  Follow up with PCP regarding blood pressure.  Thank you for allowing me to take part in the care of this patient.  Metta Clines, DO  CC:  Kathlene November, MD  Jenkins Rouge, MD

## 2017-03-01 NOTE — Patient Instructions (Signed)
1.  We will check MRI of cervical spine with and without contrast 2.  We will check copper and zinc levels 3.  For nerve discomfort, start gabapentin 300mg .  Take 3 times daily.   If it is too strong, back down to 1 pill at bedtime for 7 days, then 1 pill twice daily for 7 days, then 1 pill three times daily 4.  Follow up after testing.

## 2017-03-02 ENCOUNTER — Telehealth: Payer: Self-pay | Admitting: Internal Medicine

## 2017-03-02 NOTE — Telephone Encounter (Signed)
BP was elevated at the neurology office, I spoke with the patient, he takes his BPs at home regularly and they are most of the time ~ 120/80, occasionally they are a little higher than that. We agreed on  checking BPs daily, write a log and call me in 10 days with readings. Goal is less than 135/85.  Patient verbalized understanding.

## 2017-03-04 LAB — TIQ-NTM

## 2017-03-04 LAB — COPPER, SERUM: Copper: 80 ug/dL (ref 70–175)

## 2017-03-04 LAB — ZINC: Zinc: 72 ug/dL (ref 60–130)

## 2017-03-13 DIAGNOSIS — I119 Hypertensive heart disease without heart failure: Secondary | ICD-10-CM | POA: Diagnosis not present

## 2017-03-13 DIAGNOSIS — L97921 Non-pressure chronic ulcer of unspecified part of left lower leg limited to breakdown of skin: Secondary | ICD-10-CM | POA: Diagnosis not present

## 2017-03-13 DIAGNOSIS — I890 Lymphedema, not elsewhere classified: Secondary | ICD-10-CM | POA: Diagnosis not present

## 2017-03-13 DIAGNOSIS — Z86718 Personal history of other venous thrombosis and embolism: Secondary | ICD-10-CM | POA: Diagnosis not present

## 2017-03-13 DIAGNOSIS — L97822 Non-pressure chronic ulcer of other part of left lower leg with fat layer exposed: Secondary | ICD-10-CM | POA: Diagnosis not present

## 2017-03-13 DIAGNOSIS — I2510 Atherosclerotic heart disease of native coronary artery without angina pectoris: Secondary | ICD-10-CM | POA: Diagnosis not present

## 2017-03-14 ENCOUNTER — Telehealth: Payer: Self-pay | Admitting: *Deleted

## 2017-03-14 ENCOUNTER — Ambulatory Visit
Admission: RE | Admit: 2017-03-14 | Discharge: 2017-03-14 | Disposition: A | Discharge status: Home / Self Care | Payer: Medicare Other | Source: Ambulatory Visit | Attending: Neurology | Admitting: Neurology

## 2017-03-14 DIAGNOSIS — R200 Anesthesia of skin: Secondary | ICD-10-CM

## 2017-03-14 DIAGNOSIS — R292 Abnormal reflex: Secondary | ICD-10-CM

## 2017-03-14 NOTE — Telephone Encounter (Signed)
In MD red folder.  

## 2017-03-14 NOTE — Telephone Encounter (Signed)
Received Medical/Surgical Clearance Form from *[facility]/Dr. Wynelle Link; forwarded to provider/SLS 11/28

## 2017-03-15 NOTE — Telephone Encounter (Signed)
Clearance signed, he actually has already been cleared by cardiology, see note from Dr. Johnsie Cancel

## 2017-03-15 NOTE — Telephone Encounter (Signed)
Surgical clearance form faxed to [facility]; ATTN: Velvet Lauro Regulus at [phone]. Form sent for scanning.

## 2017-03-15 NOTE — Telephone Encounter (Signed)
Received fax confirmation

## 2017-03-16 ENCOUNTER — Other Ambulatory Visit: Payer: Medicare Other

## 2017-03-22 ENCOUNTER — Telehealth: Payer: Self-pay | Admitting: Neurology

## 2017-03-22 NOTE — Telephone Encounter (Signed)
Spoke with Pts wife, Pt layed down on table, saw where he was going and was told 30 min at the least, he was unable to do test. Complained also of it hurting his back. Wife said for him to have test he will need to be "knocked out" She said his hands are getting worse, he is depressed about being sent all over and no one knows what's wrong with him. Should I schedule at [facility] and add anesthesia?

## 2017-03-22 NOTE — Telephone Encounter (Signed)
Pt's spouse called and said pt did not do his MRI he was to claustrophobic and wants to know where to go from here  CB# [phone]

## 2017-03-23 NOTE — Telephone Encounter (Signed)
[facility] w/anesthesia is booked out until end of January early February. Like you mentioned, will need to have been seen ([facility] said [facility]) w/in 30 days. With his upcoming knee replacement scheduled in Jan, is ok to wait until he's recovered?

## 2017-03-23 NOTE — Telephone Encounter (Signed)
We can but it must be performed within 30 days from my exam note.

## 2017-03-25 ENCOUNTER — Ambulatory Visit: Payer: Self-pay | Admitting: Orthopedic Surgery

## 2017-03-28 ENCOUNTER — Ambulatory Visit (INDEPENDENT_AMBULATORY_CARE_PROVIDER_SITE_OTHER): Payer: Medicare Other | Admitting: Internal Medicine

## 2017-03-28 ENCOUNTER — Encounter: Payer: Self-pay | Admitting: Internal Medicine

## 2017-03-28 VITALS — BP 136/74 | HR 60 | Temp 98.1°F | Resp 14 | Ht 68.0 in | Wt 242.0 lb

## 2017-03-28 DIAGNOSIS — R200 Anesthesia of skin: Secondary | ICD-10-CM

## 2017-03-28 DIAGNOSIS — I10 Essential (primary) hypertension: Secondary | ICD-10-CM

## 2017-03-28 DIAGNOSIS — M1711 Unilateral primary osteoarthritis, right knee: Secondary | ICD-10-CM | POA: Diagnosis not present

## 2017-03-28 DIAGNOSIS — I2510 Atherosclerotic heart disease of native coronary artery without angina pectoris: Secondary | ICD-10-CM

## 2017-03-28 NOTE — Progress Notes (Signed)
Subjective:    Patient ID: Gregory Guerrero, male    DOB: 30-Nov-1945, 71 y.o.   MRN: [id number]  DOS:  03/28/2017 Type of visit - description : rov, here w/  wife Interval history: They have multiple concerns: Hand numbness is getting worse,   MRI pending. Was referred to nephrology, has not been seen as yet. Leg cramps, treatment?. The patient and the wife are very worried about Flor's  health   Review of Systems   Past Medical History:  Diagnosis Date  . CAD (coronary artery disease)   . Chronic kidney disease   . DVT (deep venous thrombosis) ([place])    following spinal surgery Nov 2017  . HTN (hypertension)   . MI (myocardial infarction) ([place])    1999    Past Surgical History:  Procedure Laterality Date  . ANTERIOR LAT LUMBAR FUSION N/A 03/01/2016   Procedure: EXTREME LATERAL INTERBODY FUSION  LUMBAR 2-4;  Surgeon: [name], MD;  Location: [facility];  Service: Orthopedics;  Laterality: N/A;  . CARDIAC CATHETERIZATION    . SPINAL FUSION N/A 03/02/2016   Procedure: POSTERIOR SPINAL FUSION INTERBODY L2-S1, DECOMPRESSION L4-S1;  Surgeon: [name], MD;  Location: [facility];  Service: Orthopedics;  Laterality: N/A;    Social History   Socioeconomic History  . Marital status: Married    Spouse name: Not on file  . Number of children: 2  . Years of education: Not on file  . Highest education level: Not on file  Social Needs  . Financial resource strain: Not on file  . Food insecurity - worry: Not on file  . Food insecurity - inability: Not on file  . Transportation needs - medical: Not on file  . Transportation needs - non-medical: Not on file  Occupational History  . Occupation: retired , Pension scheme manager busines   Tobacco Use  . Smoking status: Former Smoker    Types: Cigarettes    Last attempt to quit: 04/26/1983    Years since quitting: 33.9  . Smokeless tobacco: Never Used  Substance and Sexual Activity  . Alcohol use: No  . Drug use: No  . Sexual activity: No    Other Topics Concern  . Not on file  Social History Narrative   Household pt, wife, a pet    Moved from [place] as of 03/28/2017   No Known Allergies     Medication List        Accurate as of 03/28/17 11:59 PM. Always use your most recent med list.          aspirin EC 81 MG tablet Take 81 mg by mouth daily.   atorvastatin 40 MG tablet Commonly known as:  LIPITOR Take 1 tablet (40 mg total) by mouth daily.   furosemide 20 MG tablet Commonly known as:  LASIX Take 1 tablet (20 mg total) by mouth daily.   gabapentin 300 MG capsule Commonly known as:  NEURONTIN Take 1 capsule (300 mg total) 3 (three) times daily by mouth.   losartan 100 MG tablet Commonly known as:  COZAAR Take 1 tablet (100 mg total) by mouth daily.   metoprolol tartrate 25 MG tablet Commonly known as:  LOPRESSOR Take 1 tablet (25 mg total) 2 (two) times daily by mouth.   multivitamin with minerals Tabs tablet Take 1 tablet by mouth daily.   vitamin C 1000 MG tablet Take 1,000 mg by mouth daily.  Objective:   Physical Exam BP 136/74 (BP Location: Left Arm, Patient Position: Sitting, Cuff Size: Normal)   Pulse 60   Temp 98.1 F (36.7 C) (Oral)   Resp 14   Ht 5\' 8"  (1.727 m)   Wt 242 lb (109.8 kg)   SpO2 97%   BMI 36.80 kg/m  General:   Well developed, well nourished . NAD.  HEENT:  Normocephalic . Face symmetric, atraumatic Skin: Not pale. Not jaundice Neurologic:  alert & oriented X3.  Speech normal, gait appropriate for age and unassisted Psych--  Cognition and judgment appear intact.  Cooperative with normal attention span and concentration.  Behavior appropriate. No anxious or depressed appearing.      Assessment & Plan:   Assessment--- new patient 01/24/2017 HTN, dx ~ 2010 Hyperlipidemia CV: -CAD, history of remote MI; EET 11-9-18w/ no ischemia -H/o DVT ~ 02/2016 after back surgery BCC, L face, last derm note 2014 Lower extremity ulcer,  right leg after an injury H/o elevated PSA per urology note 2014, DRE normal, PSA was 3.7.   Plan: HTN: Last BMP showed a stable creatinine.  Because of mild renal failure was referred to nephrology, he is somewhat concerned because they have not to schedule an appointment right away.  His creatinine is slightly elevated but no need for urgent/emergent eval;  furthermore levels have been stable for a while.  At the end, he decided not to pursue nephrology evaluation.  Planning to be monitoring his kidney function. Hand numbness: Saw neurology, they recommend an MRI, he was intolerant due to claustrophobia and currently the MRI is pending.  Apparently they are planning to proceed with anesthesia, I wonder if a valium pre-MRI would be enough. Also, did not start gabapentin, due to cost and b/c he was not sure why it was prescribed to him.  Explained  that it is typically prescribed for paresthesias,  I offered to re-send a Rx to our pharmacy b/c cost should not be an issue, declined, states he won't try gabapentin at this time. Leg cramps: Chronic problems, for years, never tried any meds, offered  muscle relaxant, declined.  Recommend trial with tonic water. RTC 4 months > 20 min F2F d/t multiple questions

## 2017-03-28 NOTE — Progress Notes (Signed)
Pre visit review using our clinic review tool, if applicable. No additional management support is needed unless otherwise documented below in the visit note. 

## 2017-03-28 NOTE — Patient Instructions (Signed)
  GO TO THE FRONT DESK Schedule your next appointment for a  Check up in 4 months    Tonic Water ?

## 2017-03-28 NOTE — Telephone Encounter (Signed)
As long as he gets an exam acceptable to the anesthesiologist within 30 days of the MRI, then it is ok

## 2017-03-29 ENCOUNTER — Ambulatory Visit: Payer: Self-pay | Admitting: Neurology

## 2017-03-29 NOTE — Assessment & Plan Note (Signed)
HTN: Last BMP showed a stable creatinine.  Because of mild renal failure was referred to nephrology, he is somewhat concerned because they have not to schedule an appointment right away.  His creatinine is slightly elevated but no need for urgent/emergent eval;  furthermore levels have been stable for a while.  At the end, he decided not to pursue nephrology evaluation.  Planning to be monitoring his kidney function. Hand numbness: Saw neurology, they recommend an MRI, he was intolerant due to claustrophobia and currently the MRI is pending.  Apparently they are planning to proceed with anesthesia, I wonder if a valium pre-MRI would be enough. Also, did not start gabapentin, due to cost and b/c he was not sure why it was prescribed to him.  Explained  that it is typically prescribed for paresthesias,  I offered to re-send a Rx to our pharmacy b/c cost should not be an issue, declined, states he won't try gabapentin at this time. Leg cramps: Chronic problems, for years, never tried any meds, offered  muscle relaxant, declined.  Recommend trial with tonic water. RTC 4 months > 20 min F2F

## 2017-03-30 ENCOUNTER — Telehealth: Payer: Self-pay | Admitting: Neurology

## 2017-03-30 NOTE — Telephone Encounter (Signed)
Patient could not do the MRI and would like to have a open MRI and something to relax him please call patient wife back she states that he is getting worse

## 2017-03-30 NOTE — Telephone Encounter (Signed)
Spoke with Pts wife, will schedule at [facility]

## 2017-03-30 NOTE — Telephone Encounter (Signed)
Attempted to reach Pt, no answer, no VM

## 2017-03-30 NOTE — Telephone Encounter (Signed)
Called and spoke with Pts wife, she wants to try an open MRI with Valium, doesn't want to wait until appt at [facility], Pt's hands are getting worse.

## 2017-04-03 ENCOUNTER — Ambulatory Visit: Payer: Self-pay | Admitting: Orthopedic Surgery

## 2017-04-03 DIAGNOSIS — I87312 Chronic venous hypertension (idiopathic) with ulcer of left lower extremity: Secondary | ICD-10-CM | POA: Diagnosis not present

## 2017-04-03 DIAGNOSIS — L97829 Non-pressure chronic ulcer of other part of left lower leg with unspecified severity: Secondary | ICD-10-CM | POA: Diagnosis not present

## 2017-04-03 DIAGNOSIS — I890 Lymphedema, not elsewhere classified: Secondary | ICD-10-CM | POA: Diagnosis not present

## 2017-04-03 DIAGNOSIS — L97921 Non-pressure chronic ulcer of unspecified part of left lower leg limited to breakdown of skin: Secondary | ICD-10-CM | POA: Diagnosis not present

## 2017-04-03 DIAGNOSIS — I2510 Atherosclerotic heart disease of native coronary artery without angina pectoris: Secondary | ICD-10-CM | POA: Diagnosis not present

## 2017-04-03 DIAGNOSIS — Z86718 Personal history of other venous thrombosis and embolism: Secondary | ICD-10-CM | POA: Diagnosis not present

## 2017-04-03 DIAGNOSIS — I10 Essential (primary) hypertension: Secondary | ICD-10-CM | POA: Diagnosis not present

## 2017-04-03 MED ORDER — DIAZEPAM 5 MG PO TABS: 5.0000 mg | ORAL_TABLET | ORAL | 0 refills | Status: DC

## 2017-04-03 NOTE — Addendum Note (Signed)
Addended by: Clois Comber on: 04/03/2017 05:09 PM   Modules accepted: Orders

## 2017-04-03 NOTE — H&P (Signed)
Gregory Guerrero DOB: 1945/09/16 Married / Language: English / Race: White Male Date of Admission:  04/30/2017 CC:  Right knee pain History of Present Illness The patient is a 71 year old male who comes in  for a preoperative History and Physical. The patient is scheduled for a right total knee arthroplasty to be performed by Dr. Dione Plover. Aluisio, MD at [facility] on 04-30-2017. The patient is a 71 year old male who presented for follow up of their knee. The patient is being followed for their bilateral knee pain and osteoarthritis. Symptoms reported include: pain, swelling, giving way, instability, pain with weightbearing and difficulty ambulating. The patient feels that they are doing poorly and report their pain level to be moderate. The following medication has been used for pain control: antiinflammatory medication (Aleve). The patient has not gotten any relief of their symptoms with Cortisone injections or viscosupplementation. Gregory states that his RIGHT knee especially has been giving him increasingly difficult problems in the past 6 months. He has had major spine surgery and still has pain related to that but he said the knee is the main thing bothering him now. It is definitely limiting what he can and cannot do. He has pain at all times including pain at rest and at night. His function is deteriorating because of the knee problem. His LEFT knee hurts also but to a lesser degree than the RIGHT. He is ready to get the knee fixed at this time. They have been treated conservatively in the past for the above stated problem and despite conservative measures, they continue to have progressive pain and severe functional limitations and dysfunction. They have failed non-operative management including home exercise, medications, and injections. It is felt that they would benefit from undergoing total joint replacement. Risks and benefits of the procedure have been discussed with the patient and  they elect to proceed with surgery. There are no active contraindications to surgery such as ongoing infection or rapidly progressive neurological disease.  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 Diabetes Mellitus  Father. Heart Disease  Father.  Social History  Tobacco use  Former smoker. Tobacco / smoke exposure  Not under pain contract  Number of flights of stairs before winded  greater than 5 Current work status  retired No history of drug/alcohol rehab  Marital status  married Current drinker  10/23/2013: Currently drinks wine only occasionally per week Exercise  Exercises daily; does running / walking Living situation  live with spouse Children  2 [place] With Family, Home, Straight to Outpatient Therapy.  Medication History  Aspirin (81MG  Tablet Chewable, Oral) Active. Lipitor Active. Furosemide (20MG  Tablet, Oral) Active. Losartan Potassium (25MG  Tablet, Oral) Active. One-A-Day Mens (1 (one) Oral) Active. (qd) Vitamin C (1000MG  Tablet, 1 (one) Oral) Active. (qd) Metoprolol Succinate ER (25MG  Tablet  ER 24HR, Oral) Active. Aleve (220MG  Capsule, Oral) Active. Tylenol (325MG   Tablet, Oral) Active. Vitamin C Active.  Past Surgical History Heart Stents  2 Tonsillectomy  Lumbar Fusion L5-S1  Date: 02/2016. Cardiac Cath   Review of Systems General Not Present- Chills, Fatigue, Fever, Memory Loss, Night Sweats, Weight Gain and Weight Loss. Skin Not Present- Eczema, Hives, Itching, Lesions and Rash. HEENT Not Present- Dentures, Double Vision, Headache, Hearing Loss, Tinnitus and Visual Loss. Respiratory Not Present- Allergies, Chronic Cough, Coughing up blood, Shortness of breath at rest and Shortness of breath with exertion. Cardiovascular Not Present- Chest Pain, Difficulty Breathing Lying Down, Murmur, Palpitations, Racing/skipping heartbeats and Swelling. Gastrointestinal Not Present- Abdominal Pain, Bloody Stool, Constipation, Diarrhea, Difficulty Swallowing, Heartburn, Jaundice, Loss of appetitie, Nausea and Vomiting. Male Genitourinary Not Present- Blood in Urine, Discharge, Flank Pain, Incontinence, Painful Urination, Urgency, Urinary frequency, Urinary Retention, Urinating at Night and Weak urinary stream. Musculoskeletal Present- Joint Pain. Not Present- Back Pain, Joint Swelling, Morning Stiffness, Muscle Pain, Muscle Weakness and Spasms. Neurological Not Present- Blackout spells, Difficulty with balance, Dizziness, Paralysis, Tremor and Weakness. Psychiatric Not Present- Insomnia.  Vitals  Weight: 230 lb Height: 68in Weight was reported by patient. Height was reported by patient. Body Surface Area: 2.17 m Body Mass Index: 34.97 kg/m  Pulse: 64 (Regular)  BP: 138/78 (Sitting, Left Arm, Standard)  Physical Exam General Mental Status -Alert, cooperative and good historian. General Appearance-pleasant, Not in acute distress. Orientation-Oriented X3. Build & Nutrition-Well nourished and Well developed.  Head and Neck Head-normocephalic, atraumatic . Neck Global Assessment - supple, no bruit auscultated on the right, no  bruit auscultated on the left.  Eye Pupil - Bilateral-Regular and Round. Motion - Bilateral-EOMI.  Chest and Lung Exam Auscultation Breath sounds - clear at anterior chest wall and clear at posterior chest wall. Adventitious sounds - No Adventitious sounds.  Cardiovascular Auscultation Rhythm - Regular rate and rhythm. Note: with occasional ectopic or irregular beat. Heart Sounds - S1 WNL and S2 WNL. Murmurs & Other Heart Sounds - Auscultation of the heart reveals - No Murmurs.  Abdomen Inspection Contour - Generalized mild distention. Palpation/Percussion Tenderness - Abdomen is non-tender to palpation. Rigidity (guarding) - Abdomen is soft. Auscultation Auscultation of the abdomen reveals - Bowel sounds normal.  Male Genitourinary Note: Not done, not pertinent to present illness  Musculoskeletal Note: Evaluation of the left hip shows flexion to 120 rotation in 30 out 40 and abduction 40 without discomfort. There is no tenderness over the greater trochanter. There is no pain on provocative testing of the hip.Examination of the right hip shows flexion to 120 rotation in 30 abduction 40 and external rotation of 40. There is no tenderness over the greater trochanter. There is no pain on provocative testing of the hip. His LEFT knee shows no effusion. His range of motion is 5-125. He is tender medial greater than lateral. There is no instability. His RIGHT knee shows no effusion. He has varus deformity on the RIGHT. Range 5-125. There is moderate crepitus on range of motion. He is tender medial greater than lateral with no instability noted. Gait pattern is antalgic on the RIGHT.   Radiographs AP both knees and lateral of both showed he has bone-on-bone arthritis in the medial and patellofemoral compartments of the RIGHT knee with tibial subluxation and varus deformity. On the LEFT he has advanced arthritic changes but not as bad as the RIGHT.  Assessment & Plan  Primary  osteoarthritis of right knee (M17.11)  Note:Surgical Plans:  Right Total Knee Replacement  Disposition: Home, Straight to outpatient therapy in [facility]  PCP: Dr. Larose Kells - Patient has been seen preoperatively and felt to be stable for surgery. Cards: Dr. Johnsie Cancel  Topical TXA - CAD, MI, Heart Stents  Anesthesia Issues: None  Patient was instructed on what medications to stop prior to surgery.  Signed electronically by Joelene Millin, III PA-C

## 2017-04-03 NOTE — Telephone Encounter (Signed)
Called [facility], spoke with Barbie. First available appointment for open MRI, 04/11/17 arrive at 11am for 11:30 study. Advsd Pt will have Valium, confirmed he has a driver. Called Pt's wife, advsd of study appointment, she said she will have guests at that time. I provided her with the telephone number to call and r/s for a time more convenient for them.

## 2017-04-04 NOTE — Telephone Encounter (Signed)
Called in Valium Rx to [facility] in [place], 5mg  #1 no refill. To be taken 30 min prior to arriving for imaging study

## 2017-04-11 ENCOUNTER — Telehealth: Payer: Self-pay | Admitting: Neurology

## 2017-04-11 NOTE — Telephone Encounter (Signed)
Called Gregory Guerrero, advsd her was already called in, she said no one had called her about it being ready. Called [facility] in [place], spoke with Angela Nevin, Rx has been ready since the 18th, she said they do not call. Called Gregory Guerrero, advsd was available.

## 2017-04-11 NOTE — Telephone Encounter (Signed)
Patient's wife called [name] to see if he can have a prescription called in for Vallum at [facility] in [place]. He is scheduled for an MRI on 04/16/17. Thanks

## 2017-04-13 ENCOUNTER — Telehealth: Payer: Self-pay | Admitting: Neurology

## 2017-04-13 NOTE — Telephone Encounter (Signed)
FAX #  [phone]. Angie called and needs an Order sent to her for MRI of Cervical Spine With/ Without. Thanks

## 2017-04-13 NOTE — Telephone Encounter (Signed)
Faxed order

## 2017-04-18 ENCOUNTER — Telehealth: Payer: Self-pay | Admitting: Internal Medicine

## 2017-04-18 ENCOUNTER — Telehealth: Payer: Self-pay | Admitting: Neurology

## 2017-04-18 NOTE — Telephone Encounter (Signed)
Called and spoke with Charlett Nose. She's not sure what to do know, Pt to have right TKR 04/30/17 and is concerned about having sedation so close to that surgery if is scheduled at the hospital with sedation. (was scheduled at the hospital, but they felt like it was too far out and wanted an earlier appt and try an open MRI) Also has reconsidered the gabapentin and would like to start it, but will have to hold in less than a week due to surgery also. Should he wait to start till after surgery?

## 2017-04-18 NOTE — Telephone Encounter (Signed)
Patient was not able to do the open MRI they want to know what the next step is please call

## 2017-04-18 NOTE — Telephone Encounter (Signed)
Copied from [place] [phone]. Topic: Quick Communication - See Telephone Encounter >> Apr 18, 2017  9:46 AM Corie Chiquito, NT wrote: CRM for notification. See Telephone encounter for: Patients wife called because he needs a refill on his Furosemide. Wife also wanted to know if he should have this medication refilled. If someone could give them a call back at [phone]  04/18/17.

## 2017-04-19 ENCOUNTER — Other Ambulatory Visit: Payer: Self-pay

## 2017-04-19 DIAGNOSIS — G959 Disease of spinal cord, unspecified: Secondary | ICD-10-CM

## 2017-04-19 NOTE — Progress Notes (Signed)
Rcvd call from [place] at [place] at [facility], Pts wife had called her about MRI c-spine and how close to TKR could be scheduled. Advsd Sharee Pimple order is in and [facility] will contact them. I asked her about gabapentin and when should d/c, she said can take up until surgery, and she will advise Pt, she is calling them about surgery

## 2017-04-19 NOTE — Telephone Encounter (Signed)
Spoke with Sharee Pimple at [facility], she states Pt can take until surgery, and she will advise them

## 2017-04-19 NOTE — Telephone Encounter (Signed)
He can start now until he is advised to stop prior to surgery and see if it provides any relief.

## 2017-04-26 ENCOUNTER — Other Ambulatory Visit (HOSPITAL_COMMUNITY): Payer: Self-pay | Admitting: Emergency Medicine

## 2017-04-26 NOTE — Progress Notes (Signed)
CARDIAC CLEARANCE DR Johnsie Cancel 02-23-17 (SEE Epic TELEPHONE NOTE)  LOVE CARDIOLOGY DR Johnsie Cancel 02-23-17 Epic  STRESS TEST 02-23-17 Epic  ECHO 01-24-17 Epic  EKG 01-24-17 Epic  CXR 01-24-17 Epic

## 2017-04-26 NOTE — Patient Instructions (Signed)
Gregory Guerrero  04/26/2017   Your procedure is scheduled on: 04-30-17  Report to [facility] Main  Entrance    Report to admitting at [place]   Call this number if you have problems the morning of surgery [phone]     Remember: Do not eat food or drink liquids :After Midnight.     Take these medicines the morning of surgery with A SIP OF WATER: METOPROLOL, TYLENOL IF NEEDED                                You may not have any metal on your body including hair pins and              piercings  Do not wear jewelry, make-up, lotions, powders or perfumes, deodorant              Men may shave face and neck.   Do not bring valuables to the hospital. [place].  Contacts, dentures or bridgework may not be worn into surgery.  Leave suitcase in the car. After surgery it may be brought to your room.                 Please read over the following fact sheets you were given: _____________________________________________________________________             [facility] - Preparing for Surgery Before surgery, you can play an important role.  Because skin is not sterile, your skin needs to be as free of germs as possible.  You can reduce the number of germs on your skin by washing with CHG (chlorahexidine gluconate) soap before surgery.  CHG is an antiseptic cleaner which kills germs and bonds with the skin to continue killing germs even after washing. Please DO NOT use if you have an allergy to CHG or antibacterial soaps.  If your skin becomes reddened/irritated stop using the CHG and inform your nurse when you arrive at Short Stay. Do not shave (including legs and underarms) for at least 48 hours prior to the first CHG shower.  You may shave your face/neck. Please follow these instructions carefully:  1.  Shower with CHG Soap the night before surgery and the  morning of Surgery.  2.  If you choose to wash your hair,  wash your hair first as usual with your  normal  shampoo.  3.  After you shampoo, rinse your hair and body thoroughly to remove the  shampoo.                           4.  Use CHG as you would any other liquid soap.  You can apply chg directly  to the skin and wash                       Gently with a scrungie or clean washcloth.  5.  Apply the CHG Soap to your body ONLY FROM THE NECK DOWN.   Do not use on face/ open                           Wound or open sores. Avoid contact  with eyes, ears mouth and genitals (private parts).                       Wash face,  Genitals (private parts) with your normal soap.             6.  Wash thoroughly, paying special attention to the area where your surgery  will be performed.  7.  Thoroughly rinse your body with warm water from the neck down.  8.  DO NOT shower/wash with your normal soap after using and rinsing off  the CHG Soap.                9.  Pat yourself dry with a clean towel.            10.  Wear clean pajamas.            11.  Place clean sheets on your bed the night of your first shower and do not  sleep with pets. Day of Surgery : Do not apply any lotions/deodorants the morning of surgery.  Please wear clean clothes to the hospital/surgery center.  FAILURE TO FOLLOW THESE INSTRUCTIONS MAY RESULT IN THE CANCELLATION OF YOUR SURGERY PATIENT SIGNATURE_________________________________  NURSE SIGNATURE__________________________________  ________________________________________________________________________   Adam Phenix  An incentive spirometer is a tool that can help keep your lungs clear and active. This tool measures how well you are filling your lungs with each breath. Taking long deep breaths may help reverse or decrease the chance of developing breathing (pulmonary) problems (especially infection) following:  A long period of time when you are unable to move or be active. BEFORE THE PROCEDURE   If the spirometer includes an  indicator to show your best effort, your nurse or respiratory therapist will set it to a desired goal.  If possible, sit up straight or lean slightly forward. Try not to slouch.  Hold the incentive spirometer in an upright position. INSTRUCTIONS FOR USE  1. Sit on the edge of your bed if possible, or sit up as far as you can in bed or on a chair. 2. Hold the incentive spirometer in an upright position. 3. Breathe out normally. 4. Place the mouthpiece in your mouth and seal your lips tightly around it. 5. Breathe in slowly and as deeply as possible, raising the piston or the ball toward the top of the column. 6. Hold your breath for 3-5 seconds or for as long as possible. Allow the piston or ball to fall to the bottom of the column. 7. Remove the mouthpiece from your mouth and breathe out normally. 8. Rest for a few seconds and repeat Steps 1 through 7 at least 10 times every 1-2 hours when you are awake. Take your time and take a few normal breaths between deep breaths. 9. The spirometer may include an indicator to show your best effort. Use the indicator as a goal to work toward during each repetition. 10. After each set of 10 deep breaths, practice coughing to be sure your lungs are clear. If you have an incision (the cut made at the time of surgery), support your incision when coughing by placing a pillow or rolled up towels firmly against it. Once you are able to get out of bed, walk around indoors and cough well. You may stop using the incentive spirometer when instructed by your caregiver.  RISKS AND COMPLICATIONS  Take your time so you do not get dizzy or light-headed.  If you  are in pain, you may need to take or ask for pain medication before doing incentive spirometry. It is harder to take a deep breath if you are having pain. AFTER USE  Rest and breathe slowly and easily.  It can be helpful to keep track of a log of your progress. Your caregiver can provide you with a simple table  to help with this. If you are using the spirometer at home, follow these instructions: [place] IF:   You are having difficultly using the spirometer.  You have trouble using the spirometer as often as instructed.  Your pain medication is not giving enough relief while using the spirometer.  You develop fever of 100.5 F (38.1 C) or higher. SEEK IMMEDIATE MEDICAL CARE IF:   You cough up bloody sputum that had not been present before.  You develop fever of 102 F (38.9 C) or greater.  You develop worsening pain at or near the incision site. MAKE SURE YOU:   Understand these instructions.  Will watch your condition.  Will get help right away if you are not doing well or get worse. Document Released: 08/14/2006 Document Revised: 06/26/2011 Document Reviewed: 10/15/2006 ExitCare Patient Information 2014 ExitCare, [place].   ________________________________________________________________________  WHAT IS A BLOOD TRANSFUSION? Blood Transfusion Information  A transfusion is the replacement of blood or some of its parts. Blood is made up of multiple cells which provide different functions.  Red blood cells carry oxygen and are used for blood loss replacement.  White blood cells fight against infection.  Platelets control bleeding.  Plasma helps clot blood.  Other blood products are available for specialized needs, such as hemophilia or other clotting disorders. BEFORE THE TRANSFUSION  Who gives blood for transfusions?   Healthy volunteers who are fully evaluated to make sure their blood is safe. This is blood bank blood. Transfusion therapy is the safest it has ever been in the practice of medicine. Before blood is taken from a donor, a complete history is taken to make sure that person has no history of diseases nor engages in risky social behavior (examples are intravenous drug use or sexual activity with multiple partners). The donor's travel history is screened  to minimize risk of transmitting infections, such as malaria. The donated blood is tested for signs of infectious diseases, such as HIV and hepatitis. The blood is then tested to be sure it is compatible with you in order to minimize the chance of a transfusion reaction. If you or a relative donates blood, this is often done in anticipation of surgery and is not appropriate for emergency situations. It takes many days to process the donated blood. RISKS AND COMPLICATIONS Although transfusion therapy is very safe and saves many lives, the main dangers of transfusion include:   Getting an infectious disease.  Developing a transfusion reaction. This is an allergic reaction to something in the blood you were given. Every precaution is taken to prevent this. The decision to have a blood transfusion has been considered carefully by your caregiver before blood is given. Blood is not given unless the benefits outweigh the risks. AFTER THE TRANSFUSION  Right after receiving a blood transfusion, you will usually feel much better and more energetic. This is especially true if your red blood cells have gotten low (anemic). The transfusion raises the level of the red blood cells which carry oxygen, and this usually causes an energy increase.  The nurse administering the transfusion will monitor you carefully for complications. HOME  CARE INSTRUCTIONS  No special instructions are needed after a transfusion. You may find your energy is better. Speak with your caregiver about any limitations on activity for underlying diseases you may have. SEEK MEDICAL CARE IF:   Your condition is not improving after your transfusion.  You develop redness or irritation at the intravenous (IV) site. SEEK IMMEDIATE MEDICAL CARE IF:  Any of the following symptoms occur over the next 12 hours:  Shaking chills.  You have a temperature by mouth above 102 F (38.9 C), not controlled by medicine.  Chest, back, or muscle  pain.  People around you feel you are not acting correctly or are confused.  Shortness of breath or difficulty breathing.  Dizziness and fainting.  You get a rash or develop hives.  You have a decrease in urine output.  Your urine turns a dark color or changes to pink, red, or brown. Any of the following symptoms occur over the next 10 days:  You have a temperature by mouth above 102 F (38.9 C), not controlled by medicine.  Shortness of breath.  Weakness after normal activity.  The white part of the eye turns yellow (jaundice).  You have a decrease in the amount of urine or are urinating less often.  Your urine turns a dark color or changes to pink, red, or brown. Document Released: 03/31/2000 Document Revised: 06/26/2011 Document Reviewed: 11/18/2007 [facility] Patient Information 2014 [place], [place].  _______________________________________________________________________

## 2017-04-27 ENCOUNTER — Other Ambulatory Visit: Payer: Self-pay

## 2017-04-27 ENCOUNTER — Encounter (INDEPENDENT_AMBULATORY_CARE_PROVIDER_SITE_OTHER): Payer: Self-pay

## 2017-04-27 ENCOUNTER — Encounter (HOSPITAL_COMMUNITY): Payer: Self-pay

## 2017-04-27 ENCOUNTER — Encounter (HOSPITAL_COMMUNITY)
Admission: RE | Admit: 2017-04-27 | Discharge: 2017-04-27 | Disposition: A | Discharge status: Home / Self Care | Payer: Medicare Other | Source: Ambulatory Visit | Attending: Orthopedic Surgery | Admitting: Orthopedic Surgery

## 2017-04-27 LAB — COMPREHENSIVE METABOLIC PANEL
ALK PHOS: 90 U/L (ref 38–126)
ALT: 25 U/L (ref 17–63)
AST: 30 U/L (ref 15–41)
Albumin: 4.1 g/dL (ref 3.5–5.0)
Anion gap: 5 (ref 5–15)
BILIRUBIN TOTAL: 1.7 mg/dL — AB (ref 0.3–1.2)
BUN: 31 mg/dL — AB (ref 6–20)
CALCIUM: 9.4 mg/dL (ref 8.9–10.3)
CO2: 27 mmol/L (ref 22–32)
CREATININE: 1.54 mg/dL — AB (ref 0.61–1.24)
Chloride: 109 mmol/L (ref 101–111)
GFR, EST AFRICAN AMERICAN: 50 mL/min — AB (ref >60)
GFR, EST NON AFRICAN AMERICAN: 43 mL/min — AB (ref >60)
Glucose, Bld: 93 mg/dL (ref 65–99)
Potassium: 5.2 mmol/L — ABNORMAL HIGH (ref 3.5–5.1)
Sodium: 141 mmol/L (ref 135–145)
TOTAL PROTEIN: 7.3 g/dL (ref 6.5–8.1)

## 2017-04-27 LAB — ABO/RH: ABO/RH(D): O POS

## 2017-04-27 LAB — CBC
HEMATOCRIT: 44.2 % (ref 39.0–52.0)
Hemoglobin: 14.8 g/dL (ref 13.0–17.0)
MCH: 32.5 pg (ref 26.0–34.0)
MCHC: 33.5 g/dL (ref 30.0–36.0)
MCV: 96.9 fL (ref 78.0–100.0)
Platelets: 169 10*3/uL (ref 150–400)
RBC: 4.56 MIL/uL (ref 4.22–5.81)
RDW: 13.1 % (ref 11.5–15.5)
WBC: 7.6 10*3/uL (ref 4.0–10.5)

## 2017-04-27 LAB — PROTIME-INR
INR: 0.97
PROTHROMBIN TIME: 12.8 s (ref 11.4–15.2)

## 2017-04-27 LAB — SURGICAL PCR SCREEN
MRSA, PCR: NEGATIVE
Staphylococcus aureus: POSITIVE — AB

## 2017-04-27 LAB — APTT: aPTT: 31 seconds (ref 24–36)

## 2017-04-29 MED ORDER — BUPIVACAINE LIPOSOME 1.3 % IJ SUSP
20.0000 mL | Freq: Once | INTRAMUSCULAR | Status: DC
  Filled 2017-04-29: qty 20

## 2017-04-29 MED ORDER — TRANEXAMIC ACID 1000 MG/10ML IV SOLN
2000.0000 mg | Freq: Once | INTRAVENOUS | Status: DC
  Filled 2017-04-29: qty 20

## 2017-04-29 NOTE — H&P (Signed)
Gregory Guerrero DOB: [birth year] Married / Language: English / Race: White Male Date of Admission:  04/30/2017 CC:  Right knee pain History of Present Illness The patient is a 72 year old male who comes in  for a preoperative History and Physical. The patient is scheduled for a right total knee arthroplasty to be performed by Dr. Dione Plover. Aluisio, MD at [facility] on 04-30-2017. The patient is a 72 year old male who presented for follow up of their knee. The patient is being followed for their bilateral knee pain and osteoarthritis. Symptoms reported include: pain, swelling, giving way, instability, pain with weightbearing and difficulty ambulating. The patient feels that they are doing poorly and report their pain level to be moderate. The following medication has been used for pain control: antiinflammatory medication (Aleve). The patient has not gotten any relief of their symptoms with Cortisone injections or viscosupplementation. [name] states that his RIGHT knee especially has been giving him increasingly difficult problems in the past 6 months. He has had major spine surgery and still has pain related to that but he said the knee is the main thing bothering him now. It is definitely limiting what he can and cannot do. He has pain at all times including pain at rest and at night. His function is deteriorating because of the knee problem. His LEFT knee hurts also but to a lesser degree than the RIGHT. He is ready to get the knee fixed at this time. They have been treated conservatively in the past for the above stated problem and despite conservative measures, they continue to have progressive pain and severe functional limitations and dysfunction. They have failed non-operative management including home exercise, medications, and injections. It is felt that they would benefit from undergoing total joint replacement. Risks and benefits of the procedure have been discussed with the patient and  they elect to proceed with surgery. There are no active contraindications to surgery such as ongoing infection or rapidly progressive neurological disease.  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 Diabetes Mellitus  Father. Heart Disease  Father.  Social History  Tobacco use  Former smoker. Tobacco / smoke exposure  Not under pain contract  Number of flights of stairs before winded  greater than 5 Current work status  retired No history of drug/alcohol rehab  Marital status  married Current drinker  10/23/2013: Currently drinks wine only occasionally per week Exercise  Exercises daily; does running / walking Living situation  live with spouse Children  [place] With Family, Home, Straight to Outpatient Therapy.  Medication History  Aspirin (81MG  Tablet Chewable, Oral) Active. Lipitor Active. Furosemide (20MG  Tablet, Oral) Active. Losartan Potassium (25MG  Tablet, Oral) Active. One-A-Day Mens (1 (one) Oral) Active. (qd) Vitamin C (1000MG  Tablet, 1 (one) Oral) Active. (qd) Metoprolol Succinate ER (25MG  Tablet  ER 24HR, Oral) Active. Aleve (220MG  Capsule, Oral) Active. Tylenol (325MG   Tablet, Oral) Active. Vitamin C Active.  Past Surgical History Heart Stents  2 Tonsillectomy  Lumbar Fusion L5-S1  Date: 02/2016. Cardiac Cath   Review of Systems General Not Present- Chills, Fatigue, Fever, Memory Loss, Night Sweats, Weight Gain and Weight Loss. Skin Not Present- Eczema, Hives, Itching, Lesions and Rash. HEENT Not Present- Dentures, Double Vision, Headache, Hearing Loss, Tinnitus and Visual Loss. Respiratory Not Present- Allergies, Chronic Cough, Coughing up blood, Shortness of breath at rest and Shortness of breath with exertion. Cardiovascular Not Present- Chest Pain, Difficulty Breathing Lying Down, Murmur, Palpitations, Racing/skipping heartbeats and Swelling. Gastrointestinal Not Present- Abdominal Pain, Bloody Stool, Constipation, Diarrhea, Difficulty Swallowing, Heartburn, Jaundice, Loss of appetitie, Nausea and Vomiting. Male Genitourinary Not Present- Blood in Urine, Discharge, Flank Pain, Incontinence, Painful Urination, Urgency, Urinary frequency, Urinary Retention, Urinating at Night and Weak urinary stream. Musculoskeletal Present- Joint Pain. Not Present- Back Pain, Joint Swelling, Morning Stiffness, Muscle Pain, Muscle Weakness and Spasms. Neurological Not Present- Blackout spells, Difficulty with balance, Dizziness, Paralysis, Tremor and Weakness. Psychiatric Not Present- Insomnia.  Vitals  Weight: 230 lb Height: 68in Weight was reported by patient. Height was reported by patient. Body Surface Area: 2.17 m Body Mass Index: 34.97 kg/m  Pulse: 64 (Regular)  BP: 138/78 (Sitting, Left Arm, Standard)  Physical Exam General Mental Status -Alert, cooperative and good historian. General Appearance-pleasant, Not in acute distress. Orientation-Oriented X3. Build & Nutrition-Well nourished and Well developed.  Head and Neck Head-normocephalic, atraumatic . Neck Global Assessment - supple, no bruit auscultated on the right,  no bruit auscultated on the left.  Eye Pupil - Bilateral-Regular and Round. Motion - Bilateral-EOMI.  Chest and Lung Exam Auscultation Breath sounds - clear at anterior chest wall and clear at posterior chest wall. Adventitious sounds - No Adventitious sounds.  Cardiovascular Auscultation Rhythm - Regular rate and rhythm. Note: with occasional ectopic or irregular beat. Heart Sounds - S1 WNL and S2 WNL. Murmurs & Other Heart Sounds - Auscultation of the heart reveals - No Murmurs.  Abdomen Inspection Contour - Generalized mild distention. Palpation/Percussion Tenderness - Abdomen is non-tender to palpation. Rigidity (guarding) - Abdomen is soft. Auscultation Auscultation of the abdomen reveals - Bowel sounds normal.  Male Genitourinary Note: Not done, not pertinent to present illness  Musculoskeletal Note: Evaluation of the left hip shows flexion to 120 rotation in 30 out 40 and abduction 40 without discomfort. There is no tenderness over the greater trochanter. There is no pain on provocative testing of the hip.Examination of the right hip shows flexion to 120 rotation in 30 abduction 40 and external rotation of 40. There is no tenderness over the greater trochanter. There is no pain on provocative testing of the hip. His LEFT knee shows no effusion. His range of motion is 5-125. He is tender medial greater than lateral. There is no instability. His RIGHT knee shows no effusion. He has varus deformity on the RIGHT. Range 5-125. There is moderate crepitus on range of motion. He is tender medial greater than lateral with no instability noted. Gait pattern is antalgic on the RIGHT.   Radiographs AP both knees and lateral of both showed he has bone-on-bone arthritis in the medial and patellofemoral compartments of the RIGHT knee with tibial subluxation and varus deformity. On the LEFT he has advanced arthritic changes but not as bad as the RIGHT.  Assessment & Plan  Primary  osteoarthritis of right knee (M17.11)  Note:Surgical Plans:  Right Total Knee Replacement  Disposition: Home, Straight to outpatient therapy in [facility]  PCP: Dr. Larose Kells - Patient has been seen preoperatively and felt to be stable for surgery. Cards: Dr. Johnsie Cancel  Topical TXA - CAD, MI, Heart Stents  Anesthesia Issues: None  Patient was instructed on what medications to stop prior to surgery.  Signed electronically by Joelene Millin, III PA-C

## 2017-04-30 ENCOUNTER — Encounter (HOSPITAL_COMMUNITY)
Admission: RE | Disposition: A | Discharge status: Home / Self Care | Payer: Self-pay | Source: Ambulatory Visit | Attending: Orthopedic Surgery

## 2017-04-30 ENCOUNTER — Other Ambulatory Visit: Payer: Self-pay

## 2017-04-30 ENCOUNTER — Encounter (HOSPITAL_COMMUNITY): Payer: Self-pay | Admitting: *Deleted

## 2017-04-30 ENCOUNTER — Inpatient Hospital Stay (HOSPITAL_COMMUNITY): Payer: Medicare Other | Admitting: Anesthesiology

## 2017-04-30 ENCOUNTER — Inpatient Hospital Stay (HOSPITAL_COMMUNITY)
Admission: RE | Admit: 2017-04-30 | Discharge: 2017-05-02 | DRG: 470 | Disposition: A | Discharge status: Home / Self Care | Payer: Medicare Other | Source: Ambulatory Visit | Attending: Orthopedic Surgery | Admitting: Orthopedic Surgery

## 2017-04-30 DIAGNOSIS — Z7982 Long term (current) use of aspirin: Secondary | ICD-10-CM

## 2017-04-30 DIAGNOSIS — Z86718 Personal history of other venous thrombosis and embolism: Secondary | ICD-10-CM

## 2017-04-30 DIAGNOSIS — Z955 Presence of coronary angioplasty implant and graft: Secondary | ICD-10-CM | POA: Diagnosis not present

## 2017-04-30 DIAGNOSIS — M1710 Unilateral primary osteoarthritis, unspecified knee: Secondary | ICD-10-CM | POA: Diagnosis present

## 2017-04-30 DIAGNOSIS — M1711 Unilateral primary osteoarthritis, right knee: Secondary | ICD-10-CM | POA: Diagnosis present

## 2017-04-30 DIAGNOSIS — I2510 Atherosclerotic heart disease of native coronary artery without angina pectoris: Secondary | ICD-10-CM | POA: Diagnosis present

## 2017-04-30 DIAGNOSIS — Z79899 Other long term (current) drug therapy: Secondary | ICD-10-CM | POA: Diagnosis not present

## 2017-04-30 DIAGNOSIS — N183 Chronic kidney disease, stage 3 (moderate): Secondary | ICD-10-CM | POA: Diagnosis present

## 2017-04-30 DIAGNOSIS — Z791 Long term (current) use of non-steroidal anti-inflammatories (NSAID): Secondary | ICD-10-CM | POA: Diagnosis not present

## 2017-04-30 DIAGNOSIS — M25761 Osteophyte, right knee: Secondary | ICD-10-CM | POA: Diagnosis present

## 2017-04-30 DIAGNOSIS — Z8249 Family history of ischemic heart disease and other diseases of the circulatory system: Secondary | ICD-10-CM

## 2017-04-30 DIAGNOSIS — E119 Type 2 diabetes mellitus without complications: Secondary | ICD-10-CM | POA: Diagnosis not present

## 2017-04-30 DIAGNOSIS — E118 Type 2 diabetes mellitus with unspecified complications: Secondary | ICD-10-CM | POA: Diagnosis present

## 2017-04-30 DIAGNOSIS — I252 Old myocardial infarction: Secondary | ICD-10-CM | POA: Diagnosis not present

## 2017-04-30 DIAGNOSIS — Z87891 Personal history of nicotine dependence: Secondary | ICD-10-CM

## 2017-04-30 DIAGNOSIS — Z833 Family history of diabetes mellitus: Secondary | ICD-10-CM | POA: Diagnosis not present

## 2017-04-30 DIAGNOSIS — Z981 Arthrodesis status: Secondary | ICD-10-CM | POA: Diagnosis not present

## 2017-04-30 DIAGNOSIS — I129 Hypertensive chronic kidney disease with stage 1 through stage 4 chronic kidney disease, or unspecified chronic kidney disease: Secondary | ICD-10-CM | POA: Diagnosis present

## 2017-04-30 DIAGNOSIS — M419 Scoliosis, unspecified: Secondary | ICD-10-CM | POA: Diagnosis present

## 2017-04-30 DIAGNOSIS — M179 Osteoarthritis of knee, unspecified: Secondary | ICD-10-CM

## 2017-04-30 DIAGNOSIS — I10 Essential (primary) hypertension: Secondary | ICD-10-CM | POA: Diagnosis not present

## 2017-04-30 HISTORY — PX: TOTAL KNEE ARTHROPLASTY: SHX125

## 2017-04-30 LAB — TYPE AND SCREEN
ABO/RH(D): O POS
Antibody Screen: NEGATIVE

## 2017-04-30 LAB — POCT I-STAT 4, (NA,K, GLUC, HGB,HCT)
Glucose, Bld: 95 mg/dL (ref 65–99)
HCT: 38 % — ABNORMAL LOW (ref 39.0–52.0)
HEMOGLOBIN: 12.9 g/dL — AB (ref 13.0–17.0)
POTASSIUM: 4.6 mmol/L (ref 3.5–5.1)
SODIUM: 146 mmol/L — AB (ref 135–145)

## 2017-04-30 SURGERY — ARTHROPLASTY, KNEE, TOTAL
Anesthesia: Spinal | Site: Knee | Laterality: Right

## 2017-04-30 MED ORDER — FLEET ENEMA 7-19 GM/118ML RE ENEM: 1.0000 | ENEMA | Freq: Once | RECTAL | Status: DC | PRN

## 2017-04-30 MED ORDER — POLYETHYLENE GLYCOL 3350 17 G PO PACK
17.0000 g | PACK | Freq: Every day | ORAL | Status: DC | PRN
  Administered 2017-05-02: 17 g via ORAL
  Filled 2017-04-30: qty 1

## 2017-04-30 MED ORDER — FENTANYL CITRATE (PF) 100 MCG/2ML IJ SOLN
INTRAMUSCULAR | Status: AC
  Filled 2017-04-30: qty 2

## 2017-04-30 MED ORDER — KETOROLAC TROMETHAMINE 30 MG/ML IJ SOLN: 30.0000 mg | Freq: Once | INTRAMUSCULAR | Status: DC | PRN

## 2017-04-30 MED ORDER — DIPHENHYDRAMINE HCL 12.5 MG/5ML PO ELIX: 12.5000 mg | ORAL_SOLUTION | ORAL | Status: DC | PRN

## 2017-04-30 MED ORDER — MIDAZOLAM HCL 2 MG/2ML IJ SOLN
1.0000 mg | INTRAMUSCULAR | Status: DC
  Administered 2017-04-30: 0.5 mg via INTRAVENOUS
  Administered 2017-04-30: 1.5 mg via INTRAVENOUS
  Filled 2017-04-30: qty 2

## 2017-04-30 MED ORDER — MEPERIDINE HCL 50 MG/ML IJ SOLN: 6.2500 mg | INTRAMUSCULAR | Status: DC | PRN

## 2017-04-30 MED ORDER — SODIUM CHLORIDE 0.9 % IJ SOLN
INTRAMUSCULAR | Status: DC | PRN
  Administered 2017-04-30: 60 mL

## 2017-04-30 MED ORDER — SODIUM CHLORIDE 0.9 % IR SOLN
Status: DC | PRN
  Administered 2017-04-30: 1000 mL

## 2017-04-30 MED ORDER — PHENOL 1.4 % MT LIQD
1.0000 | OROMUCOSAL | Status: DC | PRN
  Filled 2017-04-30: qty 177

## 2017-04-30 MED ORDER — METOCLOPRAMIDE HCL 5 MG/ML IJ SOLN: 5.0000 mg | Freq: Three times a day (TID) | INTRAMUSCULAR | Status: DC | PRN

## 2017-04-30 MED ORDER — OXYCODONE HCL 5 MG PO TABS
5.0000 mg | ORAL_TABLET | ORAL | Status: DC | PRN
  Administered 2017-04-30 – 2017-05-02 (×3): 5 mg via ORAL
  Filled 2017-04-30 (×3): qty 1

## 2017-04-30 MED ORDER — ONDANSETRON HCL 4 MG/2ML IJ SOLN
INTRAMUSCULAR | Status: AC
  Filled 2017-04-30: qty 6

## 2017-04-30 MED ORDER — LACTATED RINGERS IV SOLN
INTRAVENOUS | Status: DC
  Administered 2017-04-30 (×3): via INTRAVENOUS

## 2017-04-30 MED ORDER — EPHEDRINE SULFATE-NACL 50-0.9 MG/10ML-% IV SOSY
PREFILLED_SYRINGE | INTRAVENOUS | Status: DC | PRN
  Administered 2017-04-30 (×5): 5 mg via INTRAVENOUS

## 2017-04-30 MED ORDER — FUROSEMIDE 20 MG PO TABS
20.0000 mg | ORAL_TABLET | Freq: Every day | ORAL | Status: DC
  Administered 2017-05-02: 11:00:00 20 mg via ORAL
  Filled 2017-04-30 (×2): qty 1

## 2017-04-30 MED ORDER — CEFAZOLIN SODIUM-DEXTROSE 2-4 GM/100ML-% IV SOLN
2.0000 g | INTRAVENOUS | Status: AC
  Administered 2017-04-30: 2 g via INTRAVENOUS
  Filled 2017-04-30: qty 100

## 2017-04-30 MED ORDER — SODIUM CHLORIDE 0.9 % IV SOLN
INTRAVENOUS | Status: DC
  Administered 2017-04-30: 16:00:00 via INTRAVENOUS

## 2017-04-30 MED ORDER — LOSARTAN POTASSIUM 50 MG PO TABS
100.0000 mg | ORAL_TABLET | Freq: Every day | ORAL | Status: DC
  Administered 2017-05-01: 17:00:00 100 mg via ORAL
  Filled 2017-04-30: qty 2

## 2017-04-30 MED ORDER — ATORVASTATIN CALCIUM 40 MG PO TABS
40.0000 mg | ORAL_TABLET | Freq: Every day | ORAL | Status: DC
  Administered 2017-04-30 – 2017-05-01 (×2): 40 mg via ORAL
  Filled 2017-04-30 (×2): qty 1

## 2017-04-30 MED ORDER — PHENYLEPHRINE 40 MCG/ML (10ML) SYRINGE FOR IV PUSH (FOR BLOOD PRESSURE SUPPORT)
PREFILLED_SYRINGE | INTRAVENOUS | Status: AC
  Filled 2017-04-30: qty 10

## 2017-04-30 MED ORDER — ONDANSETRON HCL 4 MG PO TABS: 4.0000 mg | ORAL_TABLET | Freq: Four times a day (QID) | ORAL | Status: DC | PRN

## 2017-04-30 MED ORDER — SODIUM CHLORIDE 0.9 % IJ SOLN
INTRAMUSCULAR | Status: AC
  Filled 2017-04-30: qty 50

## 2017-04-30 MED ORDER — MIDAZOLAM HCL 2 MG/2ML IJ SOLN
INTRAMUSCULAR | Status: AC
  Filled 2017-04-30: qty 2

## 2017-04-30 MED ORDER — SODIUM CHLORIDE 0.9 % IJ SOLN
INTRAMUSCULAR | Status: AC
  Filled 2017-04-30: qty 10

## 2017-04-30 MED ORDER — DEXAMETHASONE SODIUM PHOSPHATE 10 MG/ML IJ SOLN
10.0000 mg | Freq: Once | INTRAMUSCULAR | Status: AC
  Administered 2017-05-01: 08:00:00 10 mg via INTRAVENOUS
  Filled 2017-04-30: qty 1

## 2017-04-30 MED ORDER — EPHEDRINE 5 MG/ML INJ
INTRAVENOUS | Status: AC
  Filled 2017-04-30: qty 10

## 2017-04-30 MED ORDER — METOCLOPRAMIDE HCL 5 MG PO TABS: 5.0000 mg | ORAL_TABLET | Freq: Three times a day (TID) | ORAL | Status: DC | PRN

## 2017-04-30 MED ORDER — PROMETHAZINE HCL 25 MG/ML IJ SOLN: 6.2500 mg | INTRAMUSCULAR | Status: DC | PRN

## 2017-04-30 MED ORDER — METHOCARBAMOL 500 MG PO TABS
500.0000 mg | ORAL_TABLET | Freq: Four times a day (QID) | ORAL | Status: DC | PRN
  Administered 2017-05-01 – 2017-05-02 (×3): 500 mg via ORAL
  Filled 2017-04-30 (×4): qty 1

## 2017-04-30 MED ORDER — MENTHOL 3 MG MT LOZG
1.0000 | LOZENGE | OROMUCOSAL | Status: DC | PRN
Start: 2017-04-30 — End: 2017-05-02

## 2017-04-30 MED ORDER — ACETAMINOPHEN 325 MG PO TABS
650.0000 mg | ORAL_TABLET | ORAL | Status: DC | PRN
  Administered 2017-05-02: 650 mg via ORAL
  Filled 2017-04-30: qty 2

## 2017-04-30 MED ORDER — CEFAZOLIN SODIUM-DEXTROSE 2-4 GM/100ML-% IV SOLN
INTRAVENOUS | Status: AC
  Filled 2017-04-30: qty 100

## 2017-04-30 MED ORDER — PROPOFOL 500 MG/50ML IV EMUL
INTRAVENOUS | Status: DC | PRN
  Administered 2017-04-30: 40 ug/kg/min via INTRAVENOUS

## 2017-04-30 MED ORDER — DEXAMETHASONE SODIUM PHOSPHATE 10 MG/ML IJ SOLN
10.0000 mg | Freq: Once | INTRAMUSCULAR | Status: AC
  Administered 2017-04-30: 10 mg via INTRAVENOUS

## 2017-04-30 MED ORDER — DEXTROSE 5 % IV SOLN
500.0000 mg | Freq: Four times a day (QID) | INTRAVENOUS | Status: DC | PRN
  Filled 2017-04-30: qty 5

## 2017-04-30 MED ORDER — METOPROLOL TARTRATE 25 MG PO TABS
25.0000 mg | ORAL_TABLET | Freq: Two times a day (BID) | ORAL | Status: DC
  Administered 2017-04-30 – 2017-05-02 (×4): 25 mg via ORAL
  Filled 2017-04-30 (×4): qty 1

## 2017-04-30 MED ORDER — TRAMADOL HCL 50 MG PO TABS: 50.0000 mg | ORAL_TABLET | Freq: Four times a day (QID) | ORAL | Status: DC | PRN

## 2017-04-30 MED ORDER — STERILE WATER FOR IRRIGATION IR SOLN
Status: DC | PRN
  Administered 2017-04-30: 2000 mL

## 2017-04-30 MED ORDER — PROPOFOL 10 MG/ML IV BOLUS
INTRAVENOUS | Status: AC
  Filled 2017-04-30: qty 20

## 2017-04-30 MED ORDER — ONDANSETRON HCL 4 MG/2ML IJ SOLN: 4.0000 mg | Freq: Four times a day (QID) | INTRAMUSCULAR | Status: DC | PRN

## 2017-04-30 MED ORDER — FENTANYL CITRATE (PF) 100 MCG/2ML IJ SOLN
50.0000 ug | INTRAMUSCULAR | Status: DC
  Administered 2017-04-30 (×2): 50 ug via INTRAVENOUS
  Administered 2017-04-30: 100 ug via INTRAVENOUS
  Filled 2017-04-30: qty 2

## 2017-04-30 MED ORDER — ACETAMINOPHEN 10 MG/ML IV SOLN
1000.0000 mg | Freq: Once | INTRAVENOUS | Status: AC
  Administered 2017-04-30: 1000 mg via INTRAVENOUS
  Filled 2017-04-30: qty 100

## 2017-04-30 MED ORDER — OXYCODONE HCL 5 MG PO TABS
10.0000 mg | ORAL_TABLET | ORAL | Status: DC | PRN
  Administered 2017-05-01 – 2017-05-02 (×7): 10 mg via ORAL
  Filled 2017-04-30 (×7): qty 2

## 2017-04-30 MED ORDER — DOCUSATE SODIUM 100 MG PO CAPS
100.0000 mg | ORAL_CAPSULE | Freq: Two times a day (BID) | ORAL | Status: DC
  Administered 2017-05-02: 100 mg via ORAL
  Filled 2017-04-30 (×3): qty 1

## 2017-04-30 MED ORDER — MORPHINE SULFATE (PF) 2 MG/ML IV SOLN
1.0000 mg | INTRAVENOUS | Status: DC | PRN
  Administered 2017-05-02: 1 mg via INTRAVENOUS
  Filled 2017-04-30: qty 1

## 2017-04-30 MED ORDER — CHLORHEXIDINE GLUCONATE 4 % EX LIQD: 60.0000 mL | Freq: Once | CUTANEOUS | Status: DC

## 2017-04-30 MED ORDER — HYDROMORPHONE HCL 1 MG/ML IJ SOLN: 0.2500 mg | INTRAMUSCULAR | Status: DC | PRN

## 2017-04-30 MED ORDER — ONDANSETRON HCL 4 MG/2ML IJ SOLN
INTRAMUSCULAR | Status: DC | PRN
  Administered 2017-04-30: 4 mg via INTRAVENOUS

## 2017-04-30 MED ORDER — DEXAMETHASONE SODIUM PHOSPHATE 10 MG/ML IJ SOLN
INTRAMUSCULAR | Status: AC
  Filled 2017-04-30: qty 2

## 2017-04-30 MED ORDER — ACETAMINOPHEN 500 MG PO TABS
1000.0000 mg | ORAL_TABLET | Freq: Four times a day (QID) | ORAL | Status: AC
  Administered 2017-04-30 – 2017-05-01 (×3): 1000 mg via ORAL
  Filled 2017-04-30 (×3): qty 2

## 2017-04-30 MED ORDER — TRANEXAMIC ACID 1000 MG/10ML IV SOLN
INTRAVENOUS | Status: AC | PRN
  Administered 2017-04-30: 2000 mg via TOPICAL

## 2017-04-30 MED ORDER — RIVAROXABAN 10 MG PO TABS
10.0000 mg | ORAL_TABLET | Freq: Every day | ORAL | Status: DC
  Administered 2017-05-01: 08:00:00 10 mg via ORAL
  Filled 2017-04-30 (×2): qty 1

## 2017-04-30 MED ORDER — GABAPENTIN 300 MG PO CAPS
300.0000 mg | ORAL_CAPSULE | Freq: Once | ORAL | Status: AC
  Administered 2017-04-30: 300 mg via ORAL
  Filled 2017-04-30: qty 1

## 2017-04-30 MED ORDER — ACETAMINOPHEN 650 MG RE SUPP: 650.0000 mg | RECTAL | Status: DC | PRN

## 2017-04-30 MED ORDER — BISACODYL 10 MG RE SUPP: 10.0000 mg | Freq: Every day | RECTAL | Status: DC | PRN

## 2017-04-30 MED ORDER — BUPIVACAINE IN DEXTROSE 0.75-8.25 % IT SOLN
INTRATHECAL | Status: DC | PRN
  Administered 2017-04-30: 2 mL via INTRATHECAL

## 2017-04-30 MED ORDER — CEFAZOLIN SODIUM-DEXTROSE 2-4 GM/100ML-% IV SOLN
2.0000 g | Freq: Four times a day (QID) | INTRAVENOUS | Status: AC
  Administered 2017-04-30 – 2017-05-01 (×2): 2 g via INTRAVENOUS
  Filled 2017-04-30 (×2): qty 100

## 2017-04-30 MED ORDER — BUPIVACAINE LIPOSOME 1.3 % IJ SUSP
INTRAMUSCULAR | Status: DC | PRN
  Administered 2017-04-30: 20 mL

## 2017-04-30 SURGICAL SUPPLY — 53 items
BAG DECANTER FOR FLEXI CONT (MISCELLANEOUS) ×3 IMPLANT
BAG ZIPLOCK 12X15 (MISCELLANEOUS) ×3 IMPLANT
BANDAGE ACE 6X5 VEL STRL LF (GAUZE/BANDAGES/DRESSINGS) ×3 IMPLANT
BLADE SAG 18X100X1.27 (BLADE) ×3 IMPLANT
BLADE SAW SGTL 11.0X1.19X90.0M (BLADE) ×3 IMPLANT
BOWL SMART MIX CTS (DISPOSABLE) ×3 IMPLANT
CAPT KNEE TOTAL 3 ATTUNE ×3 IMPLANT
CATH COUDE 5CC RIBBED (CATHETERS) ×1 IMPLANT
CATH RIBBED COUDE 5CC (CATHETERS) ×2
CEMENT HV SMART SET (Cement) ×6 IMPLANT
CLOSURE WOUND 1/2 X4 (GAUZE/BANDAGES/DRESSINGS) ×1
COVER SURGICAL LIGHT HANDLE (MISCELLANEOUS) ×3 IMPLANT
CUFF TOURN SGL QUICK 34 (TOURNIQUET CUFF) ×2
CUFF TRNQT CYL 34X4X40X1 (TOURNIQUET CUFF) ×1 IMPLANT
DECANTER SPIKE VIAL GLASS SM (MISCELLANEOUS) ×3 IMPLANT
DRAPE U-SHAPE 47X51 STRL (DRAPES) ×3 IMPLANT
DRSG ADAPTIC 3X8 NADH LF (GAUZE/BANDAGES/DRESSINGS) ×3 IMPLANT
DRSG PAD ABDOMINAL 8X10 ST (GAUZE/BANDAGES/DRESSINGS) ×3 IMPLANT
DURAPREP 26ML APPLICATOR (WOUND CARE) ×3 IMPLANT
ELECT REM PT RETURN 15FT ADLT (MISCELLANEOUS) ×3 IMPLANT
EVACUATOR 1/8 PVC DRAIN (DRAIN) ×3 IMPLANT
GAUZE SPONGE 4X4 12PLY STRL (GAUZE/BANDAGES/DRESSINGS) ×3 IMPLANT
GLOVE BIO SURGEON STRL SZ7.5 (GLOVE) IMPLANT
GLOVE BIO SURGEON STRL SZ8 (GLOVE) ×3 IMPLANT
GLOVE BIOGEL PI IND STRL 6.5 (GLOVE) IMPLANT
GLOVE BIOGEL PI IND STRL 7.5 (GLOVE) ×6 IMPLANT
GLOVE BIOGEL PI IND STRL 8 (GLOVE) ×1 IMPLANT
GLOVE BIOGEL PI INDICATOR 6.5 (GLOVE)
GLOVE BIOGEL PI INDICATOR 7.5 (GLOVE) ×12
GLOVE BIOGEL PI INDICATOR 8 (GLOVE) ×2
GLOVE SURG SS PI 6.5 STRL IVOR (GLOVE) IMPLANT
GOWN STRL REUS W/TWL LRG LVL3 (GOWN DISPOSABLE) ×6 IMPLANT
GOWN STRL REUS W/TWL XL LVL3 (GOWN DISPOSABLE) ×6 IMPLANT
HANDPIECE INTERPULSE COAX TIP (DISPOSABLE) ×2
IMMOBILIZER KNEE 20 (SOFTGOODS) ×3
IMMOBILIZER KNEE 20 THIGH 36 (SOFTGOODS) ×1 IMPLANT
MANIFOLD NEPTUNE II (INSTRUMENTS) ×3 IMPLANT
NS IRRIG 1000ML POUR BTL (IV SOLUTION) ×3 IMPLANT
PACK TOTAL KNEE CUSTOM (KITS) ×3 IMPLANT
PADDING CAST COTTON 6X4 STRL (CAST SUPPLIES) ×3 IMPLANT
POSITIONER SURGICAL ARM (MISCELLANEOUS) ×3 IMPLANT
SET HNDPC FAN SPRY TIP SCT (DISPOSABLE) ×1 IMPLANT
STRIP CLOSURE SKIN 1/2X4 (GAUZE/BANDAGES/DRESSINGS) ×2 IMPLANT
SUT MNCRL AB 4-0 PS2 18 (SUTURE) ×3 IMPLANT
SUT STRATAFIX 0 PDS 27 VIOLET (SUTURE) ×3
SUT VIC AB 2-0 CT1 27 (SUTURE) ×6
SUT VIC AB 2-0 CT1 TAPERPNT 27 (SUTURE) ×3 IMPLANT
SUTURE STRATFX 0 PDS 27 VIOLET (SUTURE) ×1 IMPLANT
SYR 30ML LL (SYRINGE) ×6 IMPLANT
TRAY FOLEY W/METER SILVER 16FR (SET/KITS/TRAYS/PACK) ×3 IMPLANT
WATER STERILE IRR 1000ML POUR (IV SOLUTION) ×6 IMPLANT
WRAP KNEE MAXI GEL POST OP (GAUZE/BANDAGES/DRESSINGS) ×3 IMPLANT
YANKAUER SUCT BULB TIP 10FT TU (MISCELLANEOUS) ×3 IMPLANT

## 2017-04-30 NOTE — Anesthesia Postprocedure Evaluation (Signed)
Anesthesia Post Note  Patient: Gregory Guerrero  Procedure(s) Performed: RIGHT TOTAL KNEE ARTHROPLASTY (Right Knee)     Patient location during evaluation: PACU Anesthesia Type: Spinal Level of consciousness: awake and alert Pain management: pain level controlled Vital Signs Assessment: post-procedure vital signs reviewed and stable Respiratory status: spontaneous breathing and respiratory function stable Cardiovascular status: blood pressure returned to baseline and stable Postop Assessment: spinal receding Anesthetic complications: no    Last Vitals:  Vitals:   04/30/17 1553 04/30/17 1653  BP: (!) 151/89 (!) 145/74  Pulse: (!) 51 67  Resp: 14 16  Temp: (!) 36.3 C 36.5 C  SpO2: 97% 99%    Last Pain:  Vitals:   04/30/17 1653  TempSrc: Oral                 Nolon Nations

## 2017-04-30 NOTE — Anesthesia Procedure Notes (Signed)
Spinal  Patient location during procedure: OR Staffing Anesthesiologist: Nolon Nations, MD Performed: anesthesiologist  Preanesthetic Checklist Completed: patient identified, site marked, surgical consent, pre-op evaluation, timeout performed, IV checked, risks and benefits discussed and monitors and equipment checked Spinal Block Patient position: sitting Prep: ChloraPrep and site prepped and draped Patient monitoring: heart rate, continuous pulse ox and blood pressure Approach: right paramedian Location: L2-3 Injection technique: single-shot Needle Needle type: Sprotte  Needle gauge: 24 G Needle length: 9 cm Additional Notes Expiration date of kit checked and confirmed. Went above fusion scars on R back. Single stick, clear CSF. Patient tolerated procedure well, without complications.

## 2017-04-30 NOTE — Anesthesia Preprocedure Evaluation (Addendum)
Anesthesia Evaluation  Patient identified by MRN, date of birth, ID band Patient awake    Reviewed: Allergy & Precautions, NPO status , Patient's Chart, lab work & pertinent test results  Airway Mallampati: I  TM Distance: >3 FB Neck ROM: Full    Dental   Pulmonary former smoker,    Pulmonary exam normal        Cardiovascular hypertension, + CAD, + Past MI and + Cardiac Stents  Normal cardiovascular exam Rhythm:Regular  - Left ventricle: The cavity size was normal. Posterior wall   thickness was increased in a pattern of moderate LVH. Systolic function was normal. The estimated ejection fraction was 50%. Wall motion was normal; there were no regional wall motion abnormalities. Features are consistent with a pseudonormal left ventricular filling pattern, with concomitant abnormal relaxation and increased filling pressure (grade 2 diastolic dysfunction). Doppler parameters are consistent with high ventricular filling pressure. - Aortic valve: Transvalvular velocity was within the normal range.   There was no stenosis. There was no regurgitation. Valve area   (VTI): 2.85 cm^2. Valve area (Vmax): 2.47 cm^2. Valve area   (Vmean): 2.53 cm^2. - Mitral valve: Mildly calcified annulus. Transvalvular velocity   was within the normal range. There was no evidence for stenosis.   There was mild regurgitation. - Left atrium: The atrium was severely dilated. - Right ventricle: The cavity size was normal. Wall thickness was normal. Systolic function was normal. - Right atrium: The atrium was severely dilated. - Atrial septum: No defect or patent foramen ovale was identified by color flow Doppler. - Tricuspid valve: There was no regurgitation.   Neuro/Psych    GI/Hepatic   Endo/Other  diabetes, Type 2  Renal/GU Renal disease     Musculoskeletal  (+) Arthritis ,   Abdominal   Peds  Hematology   Anesthesia Other Findings   Reproductive/Obstetrics                             Anesthesia Physical  Anesthesia Plan  ASA: III  Anesthesia Plan: Spinal   Post-op Pain Management:  Regional for Post-op pain   Induction: Intravenous  PONV Risk Score and Plan:   Airway Management Planned:   Additional Equipment:   Intra-op Plan:   Post-operative Plan: Extubation in OR  Informed Consent: I have reviewed the patients History and Physical, chart, labs and discussed the procedure including the risks, benefits and alternatives for the proposed anesthesia with the patient or authorized representative who has indicated his/her understanding and acceptance.     Plan Discussed with: CRNA and Surgeon  Anesthesia Plan Comments:         Anesthesia Quick Evaluation

## 2017-04-30 NOTE — Progress Notes (Signed)
AssistedDr. Lissa Hoard with left, ultrasound guided, adductor canal block. Side rails up, monitors on throughout procedure. See vital signs in flow sheet. Tolerated Procedure well.

## 2017-04-30 NOTE — Transfer of Care (Signed)
Immediate Anesthesia Transfer of Care Note  Patient: Gregory Guerrero  Procedure(s) Performed: RIGHT TOTAL KNEE ARTHROPLASTY (Right Knee)  Patient Location: PACU  Anesthesia Type:MAC combined with regional for post-op pain  Level of Consciousness: awake, alert  and oriented  Airway & Oxygen Therapy: Patient Spontanous Breathing and Patient connected to face mask oxygen  Post-op Assessment: Report given to RN  Post vital signs: Reviewed and stable  Last Vitals:  Vitals:   04/30/17 1147 04/30/17 1148  BP:    Pulse: (!) 51 (!) 51  Resp: 14 15  SpO2: 99% 99%    Last Pain:  Vitals:   04/30/17 1042  TempSrc: Oral      Patients Stated Pain Goal: 4 (47/09/62 8366)  Complications: No apparent anesthesia complications

## 2017-04-30 NOTE — Op Note (Signed)
OPERATIVE REPORT-TOTAL KNEE ARTHROPLASTY   Pre-operative diagnosis- Osteoarthritis  Right knee(s)  Post-operative diagnosis- Osteoarthritis Right knee(s)  Procedure-  Right  Total Knee Arthroplasty  Surgeon- Dione Plover. Tawna Alwin, MD  Assistant- Arlee Muslim, PA-C   Anesthesia-  Adductor canal block and spinal  EBL- 25 ml   Drains Hemovac  Tourniquet time-  Total Tourniquet Time Documented: Thigh (Right) - 38 minutes Total: Thigh (Right) - 38 minutes     Complications- None  Condition-PACU - hemodynamically stable.   Brief Clinical Note  Gregory Guerrero is a 72 y.o. year old male with end stage OA of his right knee with progressively worsening pain and dysfunction. He has constant pain, with activity and at rest and significant functional deficits with difficulties even with ADLs. He has had extensive non-op management including analgesics, injections of cortisone and viscosupplements, and home exercise program, but remains in significant pain with significant dysfunction. Radiographs show bone on bone arthritis medial and patellofemoral. He presents now for right Total Knee Arthroplasty.    Procedure in detail---   The patient is brought into the operating room and positioned supine on the operating table. After successful administration of  Adductor canal block and spinal,   a tourniquet is placed high on the  Right thigh(s) and the lower extremity is prepped and draped in the usual sterile fashion. Time out is performed by the operating team and then the  Right lower extremity is wrapped in Esmarch, knee flexed and the tourniquet inflated to 300 mmHg.       A midline incision is made with a ten blade through the subcutaneous tissue to the level of the extensor mechanism. A fresh blade is used to make a medial parapatellar arthrotomy. Soft tissue over the proximal medial tibia is subperiosteally elevated to the joint line with a knife and into the semimembranosus bursa with a Cobb  elevator. Soft tissue over the proximal lateral tibia is elevated with attention being paid to avoiding the patellar tendon on the tibial tubercle. The patella is everted, knee flexed 90 degrees and the ACL and PCL are removed. Findings are bone on bone medial and patellofemoral with large global osteophytes.        The drill is used to create a starting hole in the distal femur and the canal is thoroughly irrigated with sterile saline to remove the fatty contents. The 5 degree Right  valgus alignment guide is placed into the femoral canal and the distal femoral cutting block is pinned to remove 9 mm off the distal femur. Resection is made with an oscillating saw.      The tibia is subluxed forward and the menisci are removed. The extramedullary alignment guide is placed referencing proximally at the medial aspect of the tibial tubercle and distally along the second metatarsal axis and tibial crest. The block is pinned to remove 21mm off the more deficient medial  side. Resection is made with an oscillating saw. Size 7is the most appropriate size for the tibia and the proximal tibia is prepared with the modular drill and keel punch for that size.      The femoral sizing guide is placed and size 8 is most appropriate. Rotation is marked off the epicondylar axis and confirmed by creating a rectangular flexion gap at 90 degrees. The size 8 cutting block is pinned in this rotation and the anterior, posterior and chamfer cuts are made with the oscillating saw. The intercondylar block is then placed and that cut is made.  Trial size 7 tibial component, trial size 8 posterior stabilized femur and a 6  mm posterior stabilized rotating platform insert trial is placed. Full extension is achieved with excellent varus/valgus and anterior/posterior balance throughout full range of motion. The patella is everted and thickness measured to be 27  mm. Free hand resection is taken to 15 mm, a 41 template is placed, lug holes  are drilled, trial patella is placed, and it tracks normally. Osteophytes are removed off the posterior femur with the trial in place. All trials are removed and the cut bone surfaces prepared with pulsatile lavage. Cement is mixed and once ready for implantation, the size 7 tibial implant, size  8 posterior stabilized femoral component, and the size 41 patella are cemented in place and the patella is held with the clamp. The trial insert is placed and the knee held in full extension. The Exparel (20 ml mixed with 60 ml saline) is injected into the extensor mechanism, posterior capsule, medial and lateral gutters and subcutaneous tissues.  All extruded cement is removed and once the cement is hard the permanent 6 mm posterior stabilized rotating platform insert is placed into the tibial tray.      The wound is copiously irrigated with saline solution and the extensor mechanism closed over a hemovac drain with #1 V-loc suture. The tourniquet is released for a total tourniquet time of 38  minutes. Flexion against gravity is 140 degrees and the patella tracks normally. Subcutaneous tissue is closed with 2.0 vicryl and subcuticular with running 4.0 Monocryl. The incision is cleaned and dried and steri-strips and a bulky sterile dressing are applied. The limb is placed into a knee immobilizer and the patient is awakened and transported to recovery in stable condition.      Please note that a surgical assistant was a medical necessity for this procedure in order to perform it in a safe and expeditious manner. Surgical assistant was necessary to retract the ligaments and vital neurovascular structures to prevent injury to them and also necessary for proper positioning of the limb to allow for anatomic placement of the prosthesis.   Dione Plover Shai Rasmussen, MD    04/30/2017, 1:50 PM

## 2017-04-30 NOTE — Interval H&P Note (Signed)
History and Physical Interval Note:  04/30/2017 10:31 AM  Gregory Guerrero  has presented today for surgery, with the diagnosis of Osteoarthritis Right knee  The various methods of treatment have been discussed with the patient and family. After consideration of risks, benefits and other options for treatment, the patient has consented to  Procedure(s): RIGHT TOTAL KNEE ARTHROPLASTY (Right) as a surgical intervention .  The patient's history has been reviewed, patient examined, no change in status, stable for surgery.  I have reviewed the patient's chart and labs.  Questions were answered to the patient's satisfaction.     Pilar Plate Kassity Woodson

## 2017-05-01 ENCOUNTER — Encounter (HOSPITAL_COMMUNITY): Payer: Self-pay | Admitting: Orthopedic Surgery

## 2017-05-01 LAB — BASIC METABOLIC PANEL
ANION GAP: 8 (ref 5–15)
BUN: 28 mg/dL — ABNORMAL HIGH (ref 6–20)
CALCIUM: 8.3 mg/dL — AB (ref 8.9–10.3)
CO2: 25 mmol/L (ref 22–32)
Chloride: 108 mmol/L (ref 101–111)
Creatinine, Ser: 1.43 mg/dL — ABNORMAL HIGH (ref 0.61–1.24)
GFR, EST AFRICAN AMERICAN: 55 mL/min — AB (ref >60)
GFR, EST NON AFRICAN AMERICAN: 47 mL/min — AB (ref >60)
Glucose, Bld: 132 mg/dL — ABNORMAL HIGH (ref 65–99)
Potassium: 4.8 mmol/L (ref 3.5–5.1)
Sodium: 141 mmol/L (ref 135–145)

## 2017-05-01 LAB — CBC
HCT: 37 % — ABNORMAL LOW (ref 39.0–52.0)
Hemoglobin: 12.2 g/dL — ABNORMAL LOW (ref 13.0–17.0)
MCH: 32.2 pg (ref 26.0–34.0)
MCHC: 33 g/dL (ref 30.0–36.0)
MCV: 97.6 fL (ref 78.0–100.0)
Platelets: 163 10*3/uL (ref 150–400)
RBC: 3.79 MIL/uL — ABNORMAL LOW (ref 4.22–5.81)
RDW: 13 % (ref 11.5–15.5)
WBC: 13.4 10*3/uL — AB (ref 4.0–10.5)

## 2017-05-01 MED ORDER — RIVAROXABAN 10 MG PO TABS: 10.0000 mg | ORAL_TABLET | Freq: Every day | ORAL | 0 refills | Status: DC

## 2017-05-01 MED ORDER — OXYCODONE HCL 5 MG PO TABS: 5.0000 mg | ORAL_TABLET | ORAL | 0 refills | Status: DC | PRN

## 2017-05-01 MED ORDER — TRAMADOL HCL 50 MG PO TABS: 50.0000 mg | ORAL_TABLET | Freq: Four times a day (QID) | ORAL | 0 refills | Status: DC | PRN

## 2017-05-01 MED ORDER — GABAPENTIN 300 MG PO CAPS
300.0000 mg | ORAL_CAPSULE | Freq: Three times a day (TID) | ORAL | Status: DC
  Administered 2017-05-01 – 2017-05-02 (×3): 300 mg via ORAL
  Filled 2017-05-01 (×3): qty 1

## 2017-05-01 MED ORDER — METHOCARBAMOL 500 MG PO TABS: 500.0000 mg | ORAL_TABLET | Freq: Four times a day (QID) | ORAL | 0 refills | Status: DC | PRN

## 2017-05-01 NOTE — Progress Notes (Addendum)
   Subjective: 1 Day Post-Op Procedure(s) (LRB): RIGHT TOTAL KNEE ARTHROPLASTY (Right) Patient reports pain as mild.   Patient seen in rounds for Dr. Wynelle Link. Patient is well, but has had some minor complaints of pain in the knee, requiring pain medications We will start therapy today. Sat up and dangled on the side of the bed last night. Plan is to go Home after hospital stay.  Objective: Vital signs in last 24 hours: Temp:  [97.7 F (36.5 C)-98.8 F (37.1 C)] 98.3 F (36.8 C) (01/15 1336) Pulse Rate:  [51-67] 58 (01/15 1336) Resp:  [16-17] 16 (01/15 1336) BP: (113-155)/(60-86) 125/74 (01/15 1336) SpO2:  [94 %-99 %] 94 % (01/15 1336)  Intake/Output from previous day:  Intake/Output Summary (Last 24 hours) at 05/01/2017 1553 Last data filed at 05/01/2017 1410 Gross per 24 hour  Intake 3686.67 ml  Output 960 ml  Net 2726.67 ml    Intake/Output this shift: Total I/O In: 1446.7 [P.O.:600; I.V.:846.7] Out: 250 [Urine:250]  Labs: Recent Labs    04/30/17 1112 05/01/17 0545  HGB 12.9* 12.2*   Recent Labs    04/30/17 1112 05/01/17 0545  WBC  --  13.4*  RBC  --  3.79*  HCT 38.0* 37.0*  PLT  --  163   Recent Labs    04/30/17 1112 05/01/17 0545  NA 146* 141  K 4.6 4.8  CL  --  108  CO2  --  25  BUN  --  28*  CREATININE  --  1.43*  GLUCOSE 95 132*  CALCIUM  --  8.3*   No results for input(s): LABPT, INR in the last 72 hours.  EXAM General - Patient is Alert, Appropriate and Oriented Extremity - Neurovascular intact Sensation intact distally Intact pulses distally Dorsiflexion/Plantar flexion intact Dressing - dressing C/D/I Motor Function - intact, moving foot and toes well on exam.    Past Medical History:  Diagnosis Date  . CAD (coronary artery disease)   . Chronic kidney disease   . DVT (deep venous thrombosis) ([place])    following spinal surgery Nov 2017  . HTN (hypertension)   . MI (myocardial infarction) ([place])    1999 ; stents placed      Assessment/Plan: 1 Day Post-Op Procedure(s) (LRB): RIGHT TOTAL KNEE ARTHROPLASTY (Right) Principal Problem:   OA (osteoarthritis) of knee  Estimated body mass index is 36.71 kg/m as calculated from the following:   Height as of this encounter: 5' 8.5" (1.74 m).   Weight as of this encounter: 111.1 kg (245 lb). Advance diet Up with therapy Plan for discharge tomorrow  DVT Prophylaxis - Xarelto Weight-Bearing as tolerated to right leg D/C O2 and Pulse OX and try on Room Air  Arlee Muslim, PA-C Orthopaedic Surgery 05/01/2017, 3:53 PM

## 2017-05-01 NOTE — Progress Notes (Signed)
Discharge planning, no HH needs identified. Plan for OP PT, has DME. [phone]

## 2017-05-01 NOTE — Evaluation (Signed)
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 Requesting pain medication. Pt admitted with above diagnosis. Pt currently with functional limitations due to the deficits listed below (see PT Problem List).  Pt will benefit from skilled PT to increase their independence and safety with mobility to allow discharge to the venue listed below.       Follow Up Recommendations Outpatient PT;DC plan and follow up therapy as arranged by surgeon    Equipment Recommendations  None recommended by PT    Recommendations for Other Services       Precautions / Restrictions Precautions Precautions: Fall;Knee Required Braces or Orthoses: Knee Immobilizer - Right Restrictions Weight Bearing Restrictions: No      Mobility  Bed Mobility Overal bed mobility: Needs Assistance Bed Mobility: Sit to Supine       Sit to supine: Supervision   General bed mobility comments: manages the right leg  Transfers Overall transfer level: Needs assistance Equipment used: Rolling walker (2 wheeled) Transfers: Sit to/from Stand Sit to Stand: Min assist         General transfer comment: steadying assistance; cues for safety, UE/LE placement  Ambulation/Gait Ambulation/Gait assistance: Min assist Ambulation Distance (Feet): 200 Feet Assistive device: Rolling walker (2 wheeled) Gait Pattern/deviations: Step-to pattern;Step-through pattern;Antalgic Gait velocity: decr   General Gait Details: did not use KI, by end of ambulation , the pation required to rest x 3 with c/o increased pain of the  right knee.   Stairs            Wheelchair Mobility    Modified Rankin (Stroke Patients Only)       Balance Overall balance assessment: Needs  assistance Sitting-balance support: Feet supported;No upper extremity supported Sitting balance-Leahy Scale: Good     Standing balance support: During functional activity;Bilateral upper extremity supported Standing balance-Leahy Scale: Fair                               Pertinent Vitals/Pain Pain Assessment: 0-10 Pain Score: 7  Pain Location: R knee Pain Descriptors / Indicators: Sore;Aching Pain Intervention(s): Monitored during session;Limited activity within patient's tolerance;Patient requesting pain meds-RN notified;Ice applied    Home Living Family/patient expects to be discharged to:: Private residence Living Arrangements: Spouse/significant other Available Help at Discharge: Family Type of Home: House Home Access: Level entry     Home Layout: One level Home Equipment: Bedside commode;Walker - 2 wheels      Prior Function Level of Independence: Independent               Hand Dominance   Dominant Hand: Right    Extremity/Trunk Assessment   Upper Extremity Assessment Upper Extremity Assessment: Defer to OT evaluation RUE Deficits / Details: shoulder to 90; decreased feeling in fingers    Lower Extremity Assessment Lower Extremity Assessment: RLE deficits/detail RLE Deficits / Details: able toSLR, knee flexion 10-80    Cervical / Trunk Assessment Cervical / Trunk Assessment: Normal  Communication   Communication: HOH  Cognition Arousal/Alertness: Awake/alert Behavior During Therapy: WFL for tasks assessed/performed Overall Cognitive Status: Within Functional Limits for tasks assessed  General Comments: had to repeat things a couple of times, likely due to [place]      Exercises Total Joint Exercises Ankle Circles/Pumps: AROM;10 reps;Both Quad Sets: AROM;10 reps;Both Heel Slides: AAROM;Right;10 reps Straight Leg Raises: AROM;Right;10 reps   Assessment/Plan    PT  Assessment Patient needs continued PT services  PT Problem List Decreased strength;Decreased range of motion;Decreased knowledge of use of DME;Decreased activity tolerance;Decreased safety awareness;Decreased balance;Decreased knowledge of precautions;Decreased mobility;Pain       PT Treatment Interventions DME instruction;Therapeutic exercise;Gait training;Balance training;Functional mobility training;Therapeutic activities    PT Goals (Current goals can be found in the Care Plan section)  Acute Rehab PT Goals Patient Stated Goal: come back for the left knee PT Goal Formulation: With patient/family Time For Goal Achievement: 05/05/17 Potential to Achieve Goals: Good    Frequency 7X/week   Barriers to discharge        Co-evaluation               AM-PAC PT "6 Clicks" Daily Activity  Outcome Measure Difficulty turning over in bed (including adjusting bedclothes, sheets and blankets)?: A Little Difficulty moving from lying on back to sitting on the side of the bed? : A Little Difficulty sitting down on and standing up from a chair with arms (e.g., wheelchair, bedside commode, etc,.)?: A Little Help needed moving to and from a bed to chair (including a wheelchair)?: A Lot Help needed walking in hospital room?: A Lot Help needed climbing 3-5 steps with a railing? : Total 6 Click Score: 14    End of Session Equipment Utilized During Treatment: Gait belt Activity Tolerance: Patient tolerated treatment well Patient left: in bed;with call bell/phone within reach;with bed alarm set;with family/visitor present Nurse Communication: Mobility status;Patient requests pain meds PT Visit Diagnosis: Difficulty in walking, not elsewhere classified (R26.2);Unsteadiness on feet (R26.81)    Time: 1696-7893 PT Time Calculation (min) (ACUTE ONLY): 27 min   Charges:         PT G CodesTresa Endo PT [phone]   Claretha Cooper 05/01/2017, 12:50 PM

## 2017-05-01 NOTE — Evaluation (Signed)
Occupational Therapy Evaluation Patient Details Name: DREYSON MISHKIN MRN: [id number] DOB: 31-Jul-1945 Today's Date: 05/01/2017    History of Present Illness s/p R TKA, h/o back sx   Clinical Impression   This 72 year old man was admitted for the above sx. He will benefit from continued OT in acute setting to increase safety and independence with bathroom transfers.  His wife can assist with adls as needed    Follow Up Recommendations  Supervision/Assistance - 24 hour    Equipment Recommendations  None recommended by OT    Recommendations for Other Services       Precautions / Restrictions Precautions Precautions: Fall;Knee Required Braces or Orthoses: Knee Immobilizer - Right Restrictions Weight Bearing Restrictions: No      Mobility Bed Mobility               General bed mobility comments: supervision, HOB raised  Transfers Overall transfer level: Needs assistance Equipment used: Rolling walker (2 wheeled) Transfers: Sit to/from Stand Sit to Stand: Min assist         General transfer comment: steadying assistance; cues for safety, UE/LE placement    Balance                                           ADL either performed or assessed with clinical judgement   ADL Overall ADL's : Needs assistance/impaired Eating/Feeding: Independent   Grooming: Brushing hair;Min guard;Bed level   Upper Body Bathing: Set up;Sitting   Lower Body Bathing: Moderate assistance;Sit to/from stand   Upper Body Dressing : Set up;Sitting   Lower Body Dressing: Maximal assistance;Sit to/from stand   Toilet Transfer: Minimal assistance;Ambulation;BSC;RW   Toileting- Water quality scientist and Hygiene: Min guard;Sit to/from stand         General ADL Comments: ambulated to bathroom; steadying assistance. Pt feels unsteady himself.  Used KI; pt is doing a SLR.  Used urinal, brushed hair.  Performed oral care from sitting     Vision         Perception      Praxis      Pertinent Vitals/Pain Pain Assessment: 0-10 Pain Score: 4  Pain Location: R knee Pain Descriptors / Indicators: Sore Pain Intervention(s): Limited activity within patient's tolerance;Monitored during session;Premedicated before session;Repositioned;Ice applied     Hand Dominance     Extremity/Trunk Assessment Upper Extremity Assessment Upper Extremity Assessment: RUE deficits/detail RUE Deficits / Details: shoulder to 90; decreased feeling in fingers           Communication Communication Communication: HOH   Cognition Arousal/Alertness: Awake/alert Behavior During Therapy: WFL for tasks assessed/performed Overall Cognitive Status: Within Functional Limits for tasks assessed                                 General Comments: had to repeat things a couple of times, likely due to [place] Comments       Exercises     Shoulder Instructions      Home Living Family/patient expects to be discharged to:: Private residence Living Arrangements: Spouse/significant other Available Help at Discharge: Family               Bathroom Shower/Tub: Walk-in Corporate treasurer Toilet: Handicapped height     Home Equipment: Bedside commode;Walker - 2 wheels  Prior Functioning/Environment Level of Independence: Independent                 OT Problem List: Decreased strength;Decreased activity tolerance;Impaired balance (sitting and/or standing);Pain;Decreased knowledge of use of DME or AE;Decreased knowledge of precautions;Decreased safety awareness      OT Treatment/Interventions: Self-care/ADL training;Patient/family education;Balance training;DME and/or AE instruction    OT Goals(Current goals can be found in the care plan section) Acute Rehab OT Goals OT Goal Formulation: With patient Time For Goal Achievement: 05/15/17 Potential to Achieve Goals: Good ADL Goals Pt Will Transfer to Toilet: with min guard  assist;bedside commode;ambulating Pt Will Perform Tub/Shower Transfer: Shower transfer;ambulating;3 in 1;with min guard assist  OT Frequency: Min 2X/week   Barriers to D/C:            Co-evaluation              AM-PAC PT "6 Clicks" Daily Activity     Outcome Measure Help from another person eating meals?: None Help from another person taking care of personal grooming?: A Little Help from another person toileting, which includes using toliet, bedpan, or urinal?: A Little Help from another person bathing (including washing, rinsing, drying)?: A Lot Help from another person to put on and taking off regular upper body clothing?: A Little Help from another person to put on and taking off regular lower body clothing?: A Lot 6 Click Score: 17   End of Session    Activity Tolerance: Patient tolerated treatment well Patient left: in chair;with call bell/phone within reach;with chair alarm set  OT Visit Diagnosis: Pain Pain - Right/Left: Right Pain - part of body: Knee                Time: 9741-6384 OT Time Calculation (min): 35 min Charges:  OT General Charges $OT Visit: 1 Visit OT Evaluation $OT Eval Low Complexity: 1 Low OT Treatments $Self Care/Home Management : 8-22 mins G-Codes:     Lesle Chris, OTR/L [phone] 05/01/2017  Melisse Caetano 05/01/2017, 9:55 AM

## 2017-05-01 NOTE — Progress Notes (Signed)
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 Plans DC tomorrow.   Follow Up Recommendations  Outpatient PT;DC plan and follow up therapy as arranged by surgeon     Equipment Recommendations  None recommended by PT    Recommendations for Other Services       Precautions / Restrictions Precautions Precautions: Fall;Knee Required Braces or Orthoses: Knee Immobilizer - Right Knee Immobilizer - Right: Discontinue once straight leg raise with < 10 degree lag    Mobility  Bed Mobility Overal bed mobility: Needs Assistance Bed Mobility: Supine to Sit;Sit to Supine     Supine to sit: Min assist Sit to supine: Min assist   General bed mobility comments: assist right leg  Transfers Overall transfer level: Needs assistance Equipment used: Rolling walker (2 wheeled) Transfers: Sit to/from Stand Sit to Stand: Min assist         General transfer comment: steadying assistance; cues for safety, UE/LE placement  Ambulation/Gait Ambulation/Gait assistance: Min assist Ambulation Distance (Feet): 80 Feet Assistive device: Rolling walker (2 wheeled) Gait Pattern/deviations: Step-to pattern;Step-through pattern Gait velocity: decr   General Gait Details: used KI, cues for posture, tends to place RW ahead.    Stairs            Wheelchair Mobility    Modified Rankin (Stroke Patients Only)       Balance Overall balance assessment: Needs assistance Sitting-balance support: Feet supported;No upper extremity supported Sitting balance-Leahy Scale: Good     Standing balance support: During functional activity;Bilateral upper extremity supported Standing balance-Leahy Scale: Fair                               Cognition Arousal/Alertness: Awake/alert Behavior During Therapy: WFL for tasks assessed/performed Overall Cognitive Status: Within Functional Limits for tasks assessed                                 General Comments: had to repeat things a couple of times, likely due to [facility]      Exercises   General Comments        Pertinent Vitals/Pain Pain Score: 7  Pain Location: R knee Pain Descriptors / Indicators: Sore;Aching Pain Intervention(s): Monitored during session;Repositioned;Premedicated before session;Ice applied    Home Living Family/patient expects to be discharged to:: Private residence Living Arrangements: Spouse/significant other Available Help at Discharge: Family Type of Home: House Home Access: Level entry   Home Layout: One level Home Equipment: Bedside commode;Walker - 2 wheels      Prior Function Level of Independence: Independent          PT Goals (current goals can now be found in the care plan section) Acute Rehab PT Goals Patient Stated Goal: come back for the left knee PT Goal Formulation: With patient/family Time For Goal Achievement: 05/05/17 Potential to Achieve Goals: Good Progress towards PT goals: Progressing toward goals    Frequency    7X/week      PT Plan Current plan remains appropriate    Co-evaluation              AM-PAC PT "6 Clicks" Daily Activity  Outcome Measure  Difficulty turning over in bed (including adjusting bedclothes, sheets and blankets)?: A Little Difficulty moving from lying on back to sitting on the side of the bed? : A Little Difficulty sitting down on and standing up from a chair with arms (e.g., wheelchair, bedside commode, etc,.)?: A Little Help needed moving to and from a bed to chair (including a wheelchair)?: A Lot Help needed walking in hospital room?: A Lot Help needed climbing 3-5 steps with a railing? : Total 6 Click Score: 14    End of Session Equipment Utilized During  Treatment: Gait belt;Right knee immobilizer Activity Tolerance: Patient limited by pain Patient left: in bed;with call bell/phone within reach;with bed alarm set Nurse Communication: Mobility status;Patient requests pain meds PT Visit Diagnosis: Difficulty in walking, not elsewhere classified (R26.2);Unsteadiness on feet (R26.81)     Time: 1345-1400 PT Time Calculation (min) (ACUTE ONLY): 15 min  Charges:  $Gait Training: 8-22 mins                    G CodesTresa Guerrero PT [phone]   Gregory Guerrero 05/01/2017, 4:04 PM

## 2017-05-01 NOTE — Discharge Instructions (Addendum)
Information on my medicine - ELIQUIS (apixaban)  This medication education was reviewed with me or my healthcare representative as part of my discharge preparation.   Why was Eliquis prescribed for you? Eliquis was prescribed for you to reduce the risk of blood clots forming after orthopedic surgery.    What do You need to know about Eliquis? Take your Eliquis TWICE DAILY - one tablet in the morning and one tablet in the evening with or without food.  It would be best to take the dose about the same time each day.  If you have difficulty swallowing the tablet whole please discuss with your pharmacist how to take the medication safely.  Take Eliquis exactly as prescribed by your doctor and DO NOT stop taking Eliquis without talking to the doctor who prescribed the medication.  Stopping without other medication to take the place of Eliquis may increase your risk of developing a clot.  After discharge, you should have regular check-up appointments with your healthcare provider that is prescribing your Eliquis.  What do you do if you miss a dose? If a dose of ELIQUIS is not taken at the scheduled time, take it as soon as possible on the same day and twice-daily administration should be resumed.  The dose should not be doubled to make up for a missed dose.  Do not take more than one tablet of ELIQUIS at the same time.  Important Safety Information A possible side effect of Eliquis is bleeding. You should call your healthcare provider right away if you experience any of the following: ? Bleeding from an injury or your nose that does not stop. ? Unusual colored urine (red or dark brown) or unusual colored stools (red or black). ? Unusual bruising for unknown reasons. ? A serious fall or if you hit your head (even if there is no bleeding).  Some medicines may interact with Eliquis and might increase your risk of bleeding or clotting while on Eliquis. To help avoid this, consult your  healthcare provider or pharmacist prior to using any new prescription or non-prescription medications, including herbals, vitamins, non-steroidal anti-inflammatory drugs (NSAIDs) and supplements.  This website has more information on Eliquis (apixaban): http://[email or web address].com/eliquis/home   Dr. [name] Total Joint Specialist [facility] [address] [place], [place] [address] [phone]  TOTAL KNEE REPLACEMENT POSTOPERATIVE DIRECTIONS  Knee Rehabilitation, Guidelines Following Surgery  Results after knee surgery are often greatly improved when you follow the exercise, range of motion and muscle strengthening exercises prescribed by your doctor. Safety measures are also important to protect the knee from further injury. Any time any of these exercises cause you to have increased pain or swelling in your knee joint, decrease the amount until you are comfortable again and slowly increase them. If you have problems or questions, call your caregiver or physical therapist for advice.   HOME CARE INSTRUCTIONS  Remove items at home which could result in a fall. This includes throw rugs or furniture in walking pathways.   ICE to the affected knee every three hours for 30 minutes at a time and then as needed for pain and swelling.  Continue to use ice on the knee for pain and swelling from surgery. You may notice swelling that will progress down to the foot and ankle.  This is normal after surgery.  Elevate the leg when you are not up walking on it.    Continue to use the breathing machine which will help keep your temperature down.  It is common for your temperature to cycle up and down following surgery, especially at night when you are not up moving around and exerting yourself.  The breathing machine keeps your lungs expanded and your temperature down.  Do not place pillow under knee, focus on keeping the knee straight while resting  DIET You may resume your  previous home diet once your are discharged from the hospital.  DRESSING / WOUND CARE / SHOWERING You may shower 3 days after surgery, but keep the wounds dry during showering.  You may use an occlusive plastic wrap (Press'n Seal for example), NO SOAKING/SUBMERGING IN THE BATHTUB.  If the bandage gets wet, change with a clean dry gauze.  If the incision gets wet, pat the wound dry with a clean towel. You may start showering once you are discharged home but do not submerge the incision under water. Just pat the incision dry and apply a dry gauze dressing on daily. Change the surgical dressing daily and reapply a dry dressing each time.  ACTIVITY Walk with your walker as instructed. Use walker as long as suggested by your caregivers. Avoid periods of inactivity such as sitting longer than an hour when not asleep. This helps prevent blood clots.  You may resume a sexual relationship in one month or when given the OK by your doctor.  You may return to work once you are cleared by your doctor.  Do not drive a car for 6 weeks or until released by you surgeon.  Do not drive while taking narcotics.  WEIGHT BEARING Weight bearing as tolerated with assist device (walker, cane, etc) as directed, use it as long as suggested by your surgeon or therapist, typically at least 4-6 weeks.  POSTOPERATIVE CONSTIPATION PROTOCOL Constipation - defined medically as fewer than three stools per week and severe constipation as less than one stool per week.  One of the most common issues patients have following surgery is constipation.  Even if you have a regular bowel pattern at home, your normal regimen is likely to be disrupted due to multiple reasons following surgery.  Combination of anesthesia, postoperative narcotics, change in appetite and fluid intake all can affect your bowels.  In order to avoid complications following surgery, here are some recommendations in order to help you during your recovery  period.  Colace (docusate) - Pick up an over-the-counter form of Colace or another stool softener and take twice a day as long as you are requiring postoperative pain medications.  Take with a full glass of water daily.  If you experience loose stools or diarrhea, hold the colace until you stool forms back up.  If your symptoms do not get better within 1 week or if they get worse, check with your doctor.  Dulcolax (bisacodyl) - Pick up over-the-counter and take as directed by the product packaging as needed to assist with the movement of your bowels.  Take with a full glass of water.  Use this product as needed if not relieved by Colace only.   MiraLax (polyethylene glycol) - Pick up over-the-counter to have on hand.  MiraLax is a solution that will increase the amount of water in your bowels to assist with bowel movements.  Take as directed and can mix with a glass of water, juice, soda, coffee, or tea.  Take if you go more than two days without a movement. Do not use MiraLax more than once per day. Call your doctor if you are still constipated or irregular after  using this medication for 7 days in a row.  If you continue to have problems with postoperative constipation, please contact the office for further assistance and recommendations.  If you experience "the worst abdominal pain ever" or develop nausea or vomiting, please contact the office immediatly for further recommendations for treatment.  ITCHING  If you experience itching with your medications, try taking only a single pain pill, or even half a pain pill at a time.  You can also use Benadryl over the counter for itching or also to help with sleep.   TED HOSE STOCKINGS Wear the elastic stockings on both legs for three weeks following surgery during the day but you may remove then at night for sleeping.  MEDICATIONS See your medication summary on the After Visit Summary that the nursing staff will review with you prior to discharge.   You may have some home medications which will be placed on hold until you complete the course of blood thinner medication.  It is important for you to complete the blood thinner medication as prescribed by your surgeon.  Continue your approved medications as instructed at time of discharge.  PRECAUTIONS If you experience chest pain or shortness of breath - call 911 immediately for transfer to the hospital emergency department.  If you develop a fever greater that 101 F, purulent drainage from wound, increased redness or drainage from wound, foul odor from the wound/dressing, or calf pain - CONTACT YOUR SURGEON.                                                   FOLLOW-UP APPOINTMENTS Make sure you keep all of your appointments after your operation with your surgeon and caregivers. You should call the office at the above phone number and make an appointment for approximately two weeks after the date of your surgery or on the date instructed by your surgeon outlined in the "After Visit Summary".   RANGE OF MOTION AND STRENGTHENING EXERCISES  Rehabilitation of the knee is important following a knee injury or an operation. After just a few days of immobilization, the muscles of the thigh which control the knee become weakened and shrink (atrophy). Knee exercises are designed to build up the tone and strength of the thigh muscles and to improve knee motion. Often times heat used for twenty to thirty minutes before working out will loosen up your tissues and help with improving the range of motion but do not use heat for the first two weeks following surgery. These exercises can be done on a training (exercise) mat, on the floor, on a table or on a bed. Use what ever works the best and is most comfortable for you Knee exercises include:  Leg Lifts - While your knee is still immobilized in a splint or cast, you can do straight leg raises. Lift the leg to 60 degrees, hold for 3 sec, and slowly lower the leg.  Repeat 10-20 times 2-3 times daily. Perform this exercise against resistance later as your knee gets better.  Quad and Hamstring Sets - Tighten up the muscle on the front of the thigh (Quad) and hold for 5-10 sec. Repeat this 10-20 times hourly. Hamstring sets are done by pushing the foot backward against an object and holding for 5-10 sec. Repeat as with quad sets.   Leg Slides: Lying  on your back, slowly slide your foot toward your buttocks, bending your knee up off the floor (only go as far as is comfortable). Then slowly slide your foot back down until your leg is flat on the floor again.  Angel Wings: Lying on your back spread your legs to the side as far apart as you can without causing discomfort.  A rehabilitation program following serious knee injuries can speed recovery and prevent re-injury in the future due to weakened muscles. Contact your doctor or a physical therapist for more information on knee rehabilitation.   IF YOU ARE TRANSFERRED TO A SKILLED REHAB FACILITY If the patient is transferred to a skilled rehab facility following release from the hospital, a list of the current medications will be sent to the facility for the patient to continue.  When discharged from the skilled rehab facility, please have the facility set up the patient's [place] prior to being released. Also, the skilled facility will be responsible for providing the patient with their medications at time of release from the facility to include their pain medication, the muscle relaxants, and their blood thinner medication. If the patient is still at the rehab facility at time of the two week follow up appointment, the skilled rehab facility will also need to assist the patient in arranging follow up appointment in our office and any transportation needs.  MAKE SURE YOU:  Understand these instructions.  Get help right away if you are not doing well or get worse.    Pick up stool softner and  laxative for home use following surgery while on pain medications. Do not submerge incision under water. Please use good hand washing techniques while changing dressing each day. May shower starting three days after surgery. Please use a clean towel to pat the incision dry following showers. Continue to use ice for pain and swelling after surgery. Do not use any lotions or creams on the incision until instructed by your surgeon.  Take Xarelto for two and a half more weeks following discharge from the hospital, then discontinue Xarelto. Once the patient has completed the Xarelto, they may resume the 81 mg Aspirin.    Information on my medicine - XARELTO (Rivaroxaban)   Why was Xarelto prescribed for you? Xarelto was prescribed for you to reduce the risk of blood clots forming after orthopedic surgery. The medical term for these abnormal blood clots is venous thromboembolism (VTE).  What do you need to know about xarelto ? Take your Xarelto ONCE DAILY at the same time every day. You may take it either with or without food.  If you have difficulty swallowing the tablet whole, you may crush it and mix in applesauce just prior to taking your dose.  Take Xarelto exactly as prescribed by your doctor and DO NOT stop taking Xarelto without talking to the doctor who prescribed the medication.  Stopping without other VTE prevention medication to take the place of Xarelto may increase your risk of developing a clot.  After discharge, you should have regular check-up appointments with your healthcare provider that is prescribing your Xarelto.    What do you do if you miss a dose? If you miss a dose, take it as soon as you remember on the same day then continue your regularly scheduled once daily regimen the next day. Do not take two doses of Xarelto on the same day.   Important Safety Information A possible side effect of Xarelto is bleeding. You should call  your healthcare provider  right away if you experience any of the following: ? Bleeding from an injury or your nose that does not stop. ? Unusual colored urine (red or dark brown) or unusual colored stools (red or black). ? Unusual bruising for unknown reasons. ? A serious fall or if you hit your head (even if there is no bleeding).  Some medicines may interact with Xarelto and might increase your risk of bleeding while on Xarelto. To help avoid this, consult your healthcare provider or pharmacist prior to using any new prescription or non-prescription medications, including herbals, vitamins, non-steroidal anti-inflammatory drugs (NSAIDs) and supplements.  This website has more information on Xarelto: [email or web address]-benson.com/.

## 2017-05-01 NOTE — Progress Notes (Signed)
Patient and wife stated he was taking gabapentin 300mg  3x's day prior to surgery for numbness in hands. But it was not ordered for to give now. Reviewed PTA meds and made Barron Alvine aware. Will resume this evening.

## 2017-05-01 NOTE — Discharge Summary (Signed)
Physician Discharge Summary   Patient ID: Gregory Guerrero MRN: [id number] DOB/AGE: Dec 10, 1945 72 y.o.  Admit date: 04/30/2017 Discharge date: 05-02-2017  Primary Diagnosis:  Osteoarthritis Right knee(s)   Admission Diagnoses:  Past Medical History:  Diagnosis Date  . CAD (coronary artery disease)   . Chronic kidney disease - Stage III (based on current lab values)   . DVT (deep venous thrombosis) ([place])    following spinal surgery Nov 2017  . HTN (hypertension)   . MI (myocardial infarction) ([place])    1999 ; stents placed    Discharge Diagnoses:   Principal Problem:   OA (osteoarthritis) of knee  Estimated body mass index is 36.71 kg/m as calculated from the following:   Height as of this encounter: 5' 8.5" (1.74 m).   Weight as of this encounter: 111.1 kg (245 lb).  Procedure:  Procedure(s) (LRB): RIGHT TOTAL KNEE ARTHROPLASTY (Right)   Consults: None  HPI: Gregory Guerrero is a 72 y.o. year old male with end stage OA of his right knee with progressively worsening pain and dysfunction. He has constant pain, with activity and at rest and significant functional deficits with difficulties even with ADLs. He has had extensive non-op management including analgesics, injections of cortisone and viscosupplements, and home exercise program, but remains in significant pain with significant dysfunction. Radiographs show bone on bone arthritis medial and patellofemoral. He presents now for right Total Knee Arthroplasty.     Laboratory Data: Admission on 04/30/2017  Component Date Value Ref Range Status  . Sodium 04/30/2017 146* 135 - 145 mmol/L Final  . Potassium 04/30/2017 4.6  3.5 - 5.1 mmol/L Final  . Glucose, Bld 04/30/2017 95  65 - 99 mg/dL Final  . HCT 04/30/2017 38.0* 39.0 - 52.0 % Final  . Hemoglobin 04/30/2017 12.9* 13.0 - 17.0 g/dL Final  . WBC 05/01/2017 13.4* 4.0 - 10.5 K/uL Final  . RBC 05/01/2017 3.79* 4.22 - 5.81 MIL/uL Final  . Hemoglobin 05/01/2017 12.2* 13.0 - 17.0  g/dL Final  . HCT 05/01/2017 37.0* 39.0 - 52.0 % Final  . MCV 05/01/2017 97.6  78.0 - 100.0 fL Final  . MCH 05/01/2017 32.2  26.0 - 34.0 pg Final  . MCHC 05/01/2017 33.0  30.0 - 36.0 g/dL Final  . RDW 05/01/2017 13.0  11.5 - 15.5 % Final  . Platelets 05/01/2017 163  150 - 400 K/uL Final  . Sodium 05/01/2017 141  135 - 145 mmol/L Final  . Potassium 05/01/2017 4.8  3.5 - 5.1 mmol/L Final  . Chloride 05/01/2017 108  101 - 111 mmol/L Final  . CO2 05/01/2017 25  22 - 32 mmol/L Final  . Glucose, Bld 05/01/2017 132* 65 - 99 mg/dL Final  . BUN 05/01/2017 28* 6 - 20 mg/dL Final  . Creatinine, Ser 05/01/2017 1.43* 0.61 - 1.24 mg/dL Final  . Calcium 05/01/2017 8.3* 8.9 - 10.3 mg/dL Final  . GFR calc non Af Amer 05/01/2017 47* >60 mL/min Final  . GFR calc Af Amer 05/01/2017 55* >60 mL/min Final   Comment: (NOTE) The eGFR has been calculated using the CKD EPI equation. This calculation has not been validated in all clinical situations. eGFR's persistently <60 mL/min signify possible Chronic Kidney Disease.   . Anion gap 05/01/2017 8  5 - 15 Final  . WBC 05/02/2017 14.6* 4.0 - 10.5 K/uL Final  . RBC 05/02/2017 3.40* 4.22 - 5.81 MIL/uL Final  . Hemoglobin 05/02/2017 11.1* 13.0 - 17.0 g/dL Final  . HCT 05/02/2017 33.5* 39.0 -  52.0 % Final  . MCV 05/02/2017 98.5  78.0 - 100.0 fL Final  . MCH 05/02/2017 32.6  26.0 - 34.0 pg Final  . MCHC 05/02/2017 33.1  30.0 - 36.0 g/dL Final  . RDW 05/02/2017 13.4  11.5 - 15.5 % Final  . Platelets 05/02/2017 147* 150 - 400 K/uL Final  . Sodium 05/02/2017 140  135 - 145 mmol/L Final  . Potassium 05/02/2017 4.5  3.5 - 5.1 mmol/L Final  . Chloride 05/02/2017 110  101 - 111 mmol/L Final  . CO2 05/02/2017 25  22 - 32 mmol/L Final  . Glucose, Bld 05/02/2017 121* 65 - 99 mg/dL Final  . BUN 05/02/2017 33* 6 - 20 mg/dL Final  . Creatinine, Ser 05/02/2017 1.52* 0.61 - 1.24 mg/dL Final  . Calcium 05/02/2017 8.5* 8.9 - 10.3 mg/dL Final  . GFR calc non Af Amer 05/02/2017  44* >60 mL/min Final  . GFR calc Af Amer 05/02/2017 51* >60 mL/min Final   Comment: (NOTE) The eGFR has been calculated using the CKD EPI equation. This calculation has not been validated in all clinical situations. eGFR's persistently <60 mL/min signify possible Chronic Kidney Disease.   Georgiann Hahn gap 05/02/2017 5  5 - 15 Final  Hospital Outpatient Visit on 04/27/2017  Component Date Value Ref Range Status  . MRSA, PCR 04/27/2017 NEGATIVE  NEGATIVE Final  . Staphylococcus aureus 04/27/2017 POSITIVE* NEGATIVE Final   Comment: (NOTE) The Xpert SA Assay (FDA approved for NASAL specimens in patients 3 years of age and older), is one component of a comprehensive surveillance program. It is not intended to diagnose infection nor to guide or monitor treatment.   Marland Kitchen aPTT 04/27/2017 31  24 - 36 seconds Final  . WBC 04/27/2017 7.6  4.0 - 10.5 K/uL Final  . RBC 04/27/2017 4.56  4.22 - 5.81 MIL/uL Final  . Hemoglobin 04/27/2017 14.8  13.0 - 17.0 g/dL Final  . HCT 04/27/2017 44.2  39.0 - 52.0 % Final  . MCV 04/27/2017 96.9  78.0 - 100.0 fL Final  . MCH 04/27/2017 32.5  26.0 - 34.0 pg Final  . MCHC 04/27/2017 33.5  30.0 - 36.0 g/dL Final  . RDW 04/27/2017 13.1  11.5 - 15.5 % Final  . Platelets 04/27/2017 169  150 - 400 K/uL Final  . Sodium 04/27/2017 141  135 - 145 mmol/L Final  . Potassium 04/27/2017 5.2* 3.5 - 5.1 mmol/L Final  . Chloride 04/27/2017 109  101 - 111 mmol/L Final  . CO2 04/27/2017 27  22 - 32 mmol/L Final  . Glucose, Bld 04/27/2017 93  65 - 99 mg/dL Final  . BUN 04/27/2017 31* 6 - 20 mg/dL Final  . Creatinine, Ser 04/27/2017 1.54* 0.61 - 1.24 mg/dL Final  . Calcium 04/27/2017 9.4  8.9 - 10.3 mg/dL Final  . Total Protein 04/27/2017 7.3  6.5 - 8.1 g/dL Final  . Albumin 04/27/2017 4.1  3.5 - 5.0 g/dL Final  . AST 04/27/2017 30  15 - 41 U/L Final  . ALT 04/27/2017 25  17 - 63 U/L Final  . Alkaline Phosphatase 04/27/2017 90  38 - 126 U/L Final  . Total Bilirubin 04/27/2017 1.7*  0.3 - 1.2 mg/dL Final  . GFR calc non Af Amer 04/27/2017 43* >60 mL/min Final  . GFR calc Af Amer 04/27/2017 50* >60 mL/min Final   Comment: (NOTE) The eGFR has been calculated using the CKD EPI equation. This calculation has not been validated in all clinical situations. eGFR's persistently <  60 mL/min signify possible Chronic Kidney Disease.   . Anion gap 04/27/2017 5  5 - 15 Final  . Prothrombin Time 04/27/2017 12.8  11.4 - 15.2 seconds Final  . INR 04/27/2017 0.97   Final  . ABO/RH(D) 04/27/2017 O POS   Final  . Antibody Screen 04/27/2017 NEG   Final  . Sample Expiration 04/27/2017 05/03/2017   Final  . Extend sample reason 04/27/2017 NO TRANSFUSIONS OR PREGNANCY IN THE PAST 3 MONTHS   Final  . ABO/RH(D) 04/27/2017 O POS   Final     X-Rays:No results found.  EKG: Orders placed or performed in visit on 01/24/17  . EKG 12-Lead     Hospital Course: Gregory Guerrero is a 72 y.o. who was admitted to [facility]. They were brought to the operating room on 04/30/2017 and underwent Procedure(s): RIGHT TOTAL KNEE ARTHROPLASTY.  Patient tolerated the procedure well and was later transferred to the recovery room and then to the orthopaedic floor for postoperative care.  They were given PO and IV analgesics for pain control following their surgery.  They were given 24 hours of postoperative antibiotics of  Anti-infectives (From admission, onward)   Start     Dose/Rate Route Frequency Ordered Stop   04/30/17 1900  ceFAZolin (ANCEF) IVPB 2g/100 mL premix     2 g 200 mL/hr over 30 Minutes Intravenous Every 6 hours 04/30/17 1605 05/01/17 0427   04/30/17 1052  ceFAZolin (ANCEF) 2-4 GM/100ML-% IVPB    Comments:  Harvell, Gwendolyn  : cabinet override      04/30/17 1052 04/30/17 1242   04/30/17 1021  ceFAZolin (ANCEF) IVPB 2g/100 mL premix     2 g 200 mL/hr over 30 Minutes Intravenous On call to O.R. 04/30/17 1021 04/30/17 1251     and started on DVT prophylaxis in the form of Xarelto.    PT and OT were ordered for total joint protocol.  Discharge planning consulted to help with postop disposition and equipment needs.  Patient had a decent night on the evening of surgery.  They started to get up OOB with therapy on day one. Hemovac drain was pulled without difficulty.  Continued to work with therapy into day two.  Dressing was changed on day two and the incision was healing well. Patient was seen in rounds on day two, Progressing with therapy, and was ready to go home.  Diet - Cardiac diet and Renal diet Follow up - in 2 weeks Activity - WBAT Disposition - Home Condition Upon Discharge - Stable D/C Meds - See DC Summary DVT Prophylaxis - Xarelto     Discharge Instructions    Call MD / Call 911   Complete by:  As directed    If you experience chest pain or shortness of breath, CALL 911 and be transported to the hospital emergency room.  If you develope a fever above 101 F, pus (white drainage) or increased drainage or redness at the wound, or calf pain, call your surgeon's office.   Change dressing   Complete by:  As directed    Change dressing daily with sterile 4 x 4 inch gauze dressing and apply TED hose. Do not submerge the incision under water.   Constipation Prevention   Complete by:  As directed    Drink plenty of fluids.  Prune juice may be helpful.  You may use a stool softener, such as Colace (over the counter) 100 mg twice a day.  Use MiraLax (over the counter)  for constipation as needed.   Diet - low sodium heart healthy   Complete by:  As directed    Discharge instructions   Complete by:  As directed    Take Xarelto for two and a half more weeks, then discontinue Xarelto. Once the patient has completed the Xarelto, they may resume the 81 mg Aspirin.   Pick up stool softner and laxative for home use following surgery while on pain medications. Do not submerge incision under water. Please use good hand washing techniques while changing dressing each  day. May shower starting three days after surgery. Please use a clean towel to pat the incision dry following showers. Continue to use ice for pain and swelling after surgery. Do not use any lotions or creams on the incision until instructed by your surgeon.  Wear both TED hose on both legs during the day every day for three weeks, but may remove the TED hose at night at home.  Postoperative Constipation Protocol  Constipation - defined medically as fewer than three stools per week and severe constipation as less than one stool per week.  One of the most common issues patients have following surgery is constipation.  Even if you have a regular bowel pattern at home, your normal regimen is likely to be disrupted due to multiple reasons following surgery.  Combination of anesthesia, postoperative narcotics, change in appetite and fluid intake all can affect your bowels.  In order to avoid complications following surgery, here are some recommendations in order to help you during your recovery period.  Colace (docusate) - Pick up an over-the-counter form of Colace or another stool softener and take twice a day as long as you are requiring postoperative pain medications.  Take with a full glass of water daily.  If you experience loose stools or diarrhea, hold the colace until you stool forms back up.  If your symptoms do not get better within 1 week or if they get worse, check with your doctor.  Dulcolax (bisacodyl) - Pick up over-the-counter and take as directed by the product packaging as needed to assist with the movement of your bowels.  Take with a full glass of water.  Use this product as needed if not relieved by Colace only.   MiraLax (polyethylene glycol) - Pick up over-the-counter to have on hand.  MiraLax is a solution that will increase the amount of water in your bowels to assist with bowel movements.  Take as directed and can mix with a glass of water, juice, soda, coffee, or tea.  Take if  you go more than two days without a movement. Do not use MiraLax more than once per day. Call your doctor if you are still constipated or irregular after using this medication for 7 days in a row.  If you continue to have problems with postoperative constipation, please contact the office for further assistance and recommendations.  If you experience "the worst abdominal pain ever" or develop nausea or vomiting, please contact the office immediatly for further recommendations for treatment.   Do not put a pillow under the knee. Place it under the heel.   Complete by:  As directed    Do not sit on low chairs, stoools or toilet seats, as it may be difficult to get up from low surfaces   Complete by:  As directed    Driving restrictions   Complete by:  As directed    No driving until released by the physician.   Increase  activity slowly as tolerated   Complete by:  As directed    Lifting restrictions   Complete by:  As directed    No lifting until released by the physician.   Patient may shower   Complete by:  As directed    You may shower without a dressing once there is no drainage.  Do not wash over the wound.  If drainage remains, do not shower until drainage stops.   TED hose   Complete by:  As directed    Use stockings (TED hose) for 3 weeks on both leg(s).  You may remove them at night for sleeping.   Weight bearing as tolerated   Complete by:  As directed    Laterality:  right   Extremity:  Lower     Allergies as of 05/02/2017   No Known Allergies     Medication List    STOP taking these medications   aspirin EC 81 MG tablet   diazepam 5 MG tablet Commonly known as:  VALIUM   multivitamin with minerals Tabs tablet   vitamin C 1000 MG tablet     TAKE these medications   acetaminophen 500 MG tablet Commonly known as:  TYLENOL Take 1,000 mg by mouth every 8 (eight) hours as needed.   apixaban 2.5 MG Tabs tablet Commonly known as:  ELIQUIS Take 1 tablet (2.5 mg  total) by mouth 2 (two) times daily. Take Eliquis twice a day for three weeks, then discontinue Eliquis. Once the patient has completed the Eliquis, they may resume the 81 mg Aspirin.   atorvastatin 40 MG tablet Commonly known as:  LIPITOR Take 1 tablet (40 mg total) by mouth daily. What changed:  when to take this   furosemide 20 MG tablet Commonly known as:  LASIX Take 1 tablet (20 mg total) by mouth daily.   gabapentin 300 MG capsule Commonly known as:  NEURONTIN Take 1 capsule (300 mg total) 3 (three) times daily by mouth.   losartan 100 MG tablet Commonly known as:  COZAAR Take 1 tablet (100 mg total) by mouth daily. What changed:  when to take this   methocarbamol 500 MG tablet Commonly known as:  ROBAXIN Take 1 tablet (500 mg total) by mouth every 6 (six) hours as needed for muscle spasms.   metoprolol tartrate 25 MG tablet Commonly known as:  LOPRESSOR Take 1 tablet (25 mg total) 2 (two) times daily by mouth.   oxyCODONE 5 MG immediate release tablet Commonly known as:  Oxy IR/ROXICODONE Take 1-2 tablets (5-10 mg total) by mouth every 4 (four) hours as needed for moderate pain or severe pain.   traMADol 50 MG tablet Commonly known as:  ULTRAM Take 1-2 tablets (50-100 mg total) by mouth every 6 (six) hours as needed (mild pain).            Discharge Care Instructions  (From admission, onward)        Start     Ordered   05/01/17 0000  Weight bearing as tolerated    Question Answer Comment  Laterality right   Extremity Lower      05/01/17 2103   05/01/17 0000  Change dressing    Comments:  Change dressing daily with sterile 4 x 4 inch gauze dressing and apply TED hose. Do not submerge the incision under water.   05/01/17 2103     Follow-up Information    [name], MD. Schedule an appointment as soon as possible for a visit  on 05/15/2017.   Specialty:  Orthopedic Surgery Contact information: [address] [place]  [address] [phone]           Signed: Arlee Muslim, PA-C Orthopaedic Surgery 05/02/2017, 8:17 AM

## 2017-05-02 LAB — BASIC METABOLIC PANEL
ANION GAP: 5 (ref 5–15)
BUN: 33 mg/dL — ABNORMAL HIGH (ref 6–20)
CHLORIDE: 110 mmol/L (ref 101–111)
CO2: 25 mmol/L (ref 22–32)
CREATININE: 1.52 mg/dL — AB (ref 0.61–1.24)
Calcium: 8.5 mg/dL — ABNORMAL LOW (ref 8.9–10.3)
GFR calc Af Amer: 51 mL/min — ABNORMAL LOW (ref >60)
GFR calc non Af Amer: 44 mL/min — ABNORMAL LOW (ref >60)
Glucose, Bld: 121 mg/dL — ABNORMAL HIGH (ref 65–99)
POTASSIUM: 4.5 mmol/L (ref 3.5–5.1)
SODIUM: 140 mmol/L (ref 135–145)

## 2017-05-02 LAB — CBC
HEMATOCRIT: 33.5 % — AB (ref 39.0–52.0)
HEMOGLOBIN: 11.1 g/dL — AB (ref 13.0–17.0)
MCH: 32.6 pg (ref 26.0–34.0)
MCHC: 33.1 g/dL (ref 30.0–36.0)
MCV: 98.5 fL (ref 78.0–100.0)
Platelets: 147 10*3/uL — ABNORMAL LOW (ref 150–400)
RBC: 3.4 MIL/uL — AB (ref 4.22–5.81)
RDW: 13.4 % (ref 11.5–15.5)
WBC: 14.6 10*3/uL — ABNORMAL HIGH (ref 4.0–10.5)

## 2017-05-02 MED ORDER — APIXABAN 2.5 MG PO TABS: 2.5000 mg | ORAL_TABLET | Freq: Two times a day (BID) | ORAL | 0 refills | Status: DC

## 2017-05-02 MED ORDER — METHOCARBAMOL 500 MG PO TABS: 500.0000 mg | ORAL_TABLET | Freq: Four times a day (QID) | ORAL | 0 refills | Status: DC | PRN

## 2017-05-02 MED ORDER — APIXABAN 2.5 MG PO TABS
2.5000 mg | ORAL_TABLET | Freq: Two times a day (BID) | ORAL | Status: DC
  Administered 2017-05-02: 2.5 mg via ORAL
  Filled 2017-05-02: qty 1

## 2017-05-02 MED ORDER — TRAMADOL HCL 50 MG PO TABS: 50.0000 mg | ORAL_TABLET | Freq: Four times a day (QID) | ORAL | 0 refills | Status: DC | PRN

## 2017-05-02 MED ORDER — OXYCODONE HCL 5 MG PO TABS: 5.0000 mg | ORAL_TABLET | ORAL | 0 refills | Status: DC | PRN

## 2017-05-02 NOTE — Progress Notes (Signed)
   Subjective: 2 Days Post-Op Procedure(s) (LRB): RIGHT TOTAL KNEE ARTHROPLASTY (Right) Patient reports pain as mild.   Patient seen in rounds with Dr. Wynelle Link. Patient is well, but has had some minor complaints of pain in the knee, requiring pain medications Patient is ready to go home  Objective: Vital signs in last 24 hours: Temp:  [98.1 F (36.7 C)-98.3 F (36.8 C)] 98.2 F (36.8 C) (01/16 0428) Pulse Rate:  [58-66] 64 (01/16 0428) Resp:  [16-19] 16 (01/16 0428) BP: (125-154)/(68-87) 154/87 (01/16 0428) SpO2:  [94 %-98 %] 96 % (01/16 0428)  Intake/Output from previous day:  Intake/Output Summary (Last 24 hours) at 05/02/2017 0815 Last data filed at 05/02/2017 0429 Gross per 24 hour  Intake 2166.67 ml  Output 250 ml  Net 1916.67 ml    Intake/Output this shift: No intake/output data recorded.  Labs: Recent Labs    04/30/17 1112 05/01/17 0545 05/02/17 0536  HGB 12.9* 12.2* 11.1*   Recent Labs    05/01/17 0545 05/02/17 0536  WBC 13.4* 14.6*  RBC 3.79* 3.40*  HCT 37.0* 33.5*  PLT 163 147*   Recent Labs    05/01/17 0545 05/02/17 0536  NA 141 140  K 4.8 4.5  CL 108 110  CO2 25 25  BUN 28* 33*  CREATININE 1.43* 1.52*  GLUCOSE 132* 121*  CALCIUM 8.3* 8.5*   No results for input(s): LABPT, INR in the last 72 hours.  EXAM: General - Patient is Alert, Appropriate and Oriented Extremity - Neurovascular intact Sensation intact distally Intact pulses distally Incision - clean, dry, no drainage Motor Function - intact, moving foot and toes well on exam.   Assessment/Plan: 2 Days Post-Op Procedure(s) (LRB): RIGHT TOTAL KNEE ARTHROPLASTY (Right) Procedure(s) (LRB): RIGHT TOTAL KNEE ARTHROPLASTY (Right) Past Medical History:  Diagnosis Date  . CAD (coronary artery disease)   . Chronic kidney disease   . DVT (deep venous thrombosis) ([place])    following spinal surgery Nov 2017  . HTN (hypertension)   . MI (myocardial infarction) ([place])    1999 ; stents  placed    Principal Problem:   OA (osteoarthritis) of knee  Estimated body mass index is 36.71 kg/m as calculated from the following:   Height as of this encounter: 5' 8.5" (1.74 m).   Weight as of this encounter: 111.1 kg (245 lb). Up with therapy Diet - Cardiac diet and Renal diet Follow up - in 2 weeks Activity - WBAT Disposition - Home Condition Upon Discharge - Stable D/C Meds - See DC Summary DVT Prophylaxis - Xarelto  Arlee Muslim, PA-C Orthopaedic Surgery 05/02/2017, 8:15 AM

## 2017-05-02 NOTE — Progress Notes (Signed)
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   OT comments  Performed bathroom transfers; plan to touch base with wife prior to pt's discharge:  Guarding, shower sequence with 3:1 and high commode  Follow Up Recommendations  Supervision/Assistance - 24 hour    Equipment Recommendations  None recommended by OT    Recommendations for Other Services      Precautions / Restrictions Precautions Precautions: Fall;Knee Precaution Comments: did 10 SLR; did not use KI Restrictions Weight Bearing Restrictions: No       Mobility Bed Mobility         Supine to sit: Supervision        Transfers   Equipment used: Rolling walker (2 wheeled)   Sit to Stand: Min guard         General transfer comment: for safety; cues for UE placement    Balance                                           ADL either performed or assessed with clinical judgement   ADL                           Toilet Transfer: Min guard;Ambulation;BSC;RW   Toileting- Clothing Manipulation and Hygiene: Minimal assistance;Sit to/from stand   Tub/ Shower Transfer: Min guard;Ambulation;3 in 1;Rolling walker     General ADL Comments: practiced bathroom transfers; pt had one LOB when standing to urinate.  Light assistance to recover balance.  Highly recommended he use 3:1 as shower seat. He wants to try his higher commode with nothing to push from, but he did not want to practice now as pain increased     Vision       Perception     Praxis      Cognition Arousal/Alertness: Awake/alert Behavior During Therapy: WFL for tasks assessed/performed Overall Cognitive Status: Within Functional Limits for tasks assessed                                 General Comments: reinforced safety/sequence. Pt a  little tangential at times        Exercises     Shoulder Instructions       General Comments      Pertinent Vitals/ Pain       Pain Score: 7  Pain Location: R knee Pain Descriptors / Indicators: Sore;Aching Pain Intervention(s): Limited activity within patient's tolerance;Monitored during session;Premedicated before session;Repositioned;Ice applied  Home Living                                          Prior Functioning/Environment              Frequency           Progress Toward Goals  OT Goals(current goals can now be found in the care plan section)  Progress towards OT goals: Progressing toward goals     Plan      Co-evaluation  AM-PAC PT "6 Clicks" Daily Activity     Outcome Measure   Help from another person eating meals?: None Help from another person taking care of personal grooming?: A Little Help from another person toileting, which includes using toliet, bedpan, or urinal?: A Little Help from another person bathing (including washing, rinsing, drying)?: A Lot Help from another person to put on and taking off regular upper body clothing?: A Little Help from another person to put on and taking off regular lower body clothing?: A Lot 6 Click Score: 17    End of Session    OT Visit Diagnosis: Pain Pain - Right/Left: Right Pain - part of body: Knee   Activity Tolerance Patient tolerated treatment well   Patient Left in chair;with call bell/phone within reach;with chair alarm set   Nurse Communication          Time: 4196-2229 OT Time Calculation (min): 19 min  Charges: OT General Charges $OT Visit: 1 Visit OT Treatments $Self Care/Home Management : 8-22 mins  Lesle Chris, OTR/L [phone] 05/02/2017   [place] 05/02/2017, 10:14 AM

## 2017-05-02 NOTE — Progress Notes (Signed)
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 Encouraged patient that this  Is normal process of post surgery. Plans Dc today.   Follow Up Recommendations  Outpatient PT;DC plan and follow up therapy as arranged by surgeon     Equipment Recommendations  None recommended by PT    Recommendations for Other Services       Precautions / Restrictions Precautions Precautions: Fall;Knee Precaution Comments: did 10 SLR; did not use KI Required Braces or Orthoses: Knee Immobilizer - Right Knee Immobilizer - Right: Discontinue once straight leg raise with < 10 degree lag Restrictions Weight Bearing Restrictions: No    Mobility  Bed Mobility         Supine to sit: Supervision Sit to supine: Min assist   General bed mobility comments: assist right leg  Transfers   Equipment used: Rolling walker (2 wheeled) Transfers: Sit to/from Stand Sit to Stand: Min guard         General transfer comment: for safety; cues for UE placement  Ambulation/Gait Ambulation/Gait assistance: Min assist Ambulation Distance (Feet): 80 Feet Assistive device: Rolling walker (2 wheeled) Gait Pattern/deviations: Step-to pattern;Trunk flexed;Antalgic     General Gait Details: frequent cues for posture and remain inside RW, Did not wear KI, required multiple stop and rest breaks   Stairs            Wheelchair Mobility    Modified Rankin (Stroke Patients Only)       Balance                                            Cognition Arousal/Alertness: Awake/alert Behavior During Therapy: WFL for tasks assessed/performed Overall Cognitive Status: Within  Functional Limits for tasks assessed                                 General Comments: reinforced safety/sequence. Pt a little tangential at times      Exercises Total Joint Exercises Ankle Circles/Pumps: AROM;10 reps;Both Quad Sets: AROM;10 reps;Both Heel Slides: AAROM;Right;5 reps Straight Leg Raises: AAROM;Right;5 reps Goniometric ROM: 10-50 right knee flexion    General Comments        Pertinent Vitals/Pain Pain Score: 9  Pain Location: R knee Pain Descriptors / Indicators: Sore;Aching;Stabbing Pain Intervention(s): Monitored during session;Premedicated before session;Patient requesting pain meds-RN notified;Repositioned    Home Living                      Prior Function            PT Goals (current goals can now be found in the care plan section) Progress towards PT goals: Progressing toward goals    Frequency    7X/week      PT Plan Current plan remains appropriate    Co-evaluation              AM-PAC PT "6 Clicks" Daily Activity  Outcome Measure  Difficulty turning over in bed (including adjusting bedclothes, sheets and blankets)?:  A Little Difficulty moving from lying on back to sitting on the side of the bed? : A Little Difficulty sitting down on and standing up from a chair with arms (e.g., wheelchair, bedside commode, etc,.)?: A Lot Help needed moving to and from a bed to chair (including a wheelchair)?: A Lot Help needed walking in hospital room?: A Lot Help needed climbing 3-5 steps with a railing? : Total 6 Click Score: 13    End of Session Equipment Utilized During Treatment: Gait belt Activity Tolerance: Patient tolerated treatment well Patient left: in bed;with call bell/phone within reach;with family/visitor present Nurse Communication: Mobility status PT Visit Diagnosis: Difficulty in walking, not elsewhere classified (R26.2);Unsteadiness on feet (R26.81)     Time: 1561-5379 PT Time Calculation (min)  (ACUTE ONLY): 31 min  Charges:  $Gait Training: 8-22 mins $Therapeutic Exercise: 8-22 mins                    G CodesTresa Endo PT [phone]    Claretha Cooper 05/02/2017, 1:07 PM

## 2017-05-02 NOTE — Progress Notes (Signed)
Pt's room alarm bell activated. Pt found up in room independently with walker. Stated that he needed to void. Pt assisted to bathroom then back to bed without difficulty. Pt in NAD at this time. VSS. Instructed to use call bell for assist. Return demonstration with pt successful. Call bell within pt;s reach.

## 2017-05-04 ENCOUNTER — Telehealth: Payer: Self-pay

## 2017-05-04 DIAGNOSIS — Z9181 History of falling: Secondary | ICD-10-CM | POA: Diagnosis not present

## 2017-05-04 DIAGNOSIS — M25661 Stiffness of right knee, not elsewhere classified: Secondary | ICD-10-CM | POA: Diagnosis not present

## 2017-05-04 DIAGNOSIS — Z96651 Presence of right artificial knee joint: Secondary | ICD-10-CM | POA: Diagnosis not present

## 2017-05-04 DIAGNOSIS — M25561 Pain in right knee: Secondary | ICD-10-CM | POA: Diagnosis not present

## 2017-05-04 DIAGNOSIS — R531 Weakness: Secondary | ICD-10-CM | POA: Diagnosis not present

## 2017-05-04 DIAGNOSIS — R6889 Other general symptoms and signs: Secondary | ICD-10-CM | POA: Diagnosis not present

## 2017-05-04 DIAGNOSIS — R269 Unspecified abnormalities of gait and mobility: Secondary | ICD-10-CM | POA: Diagnosis not present

## 2017-05-04 DIAGNOSIS — R29898 Other symptoms and signs involving the musculoskeletal system: Secondary | ICD-10-CM | POA: Diagnosis not present

## 2017-05-04 DIAGNOSIS — G8918 Other acute postprocedural pain: Secondary | ICD-10-CM | POA: Diagnosis not present

## 2017-05-04 NOTE — Telephone Encounter (Signed)
05/04/17  TCM Hospital follow up  Called patient wife answered phone. Wanted to know what I was calling for. She received another phone call and advised she would call back and hung up

## 2017-05-07 ENCOUNTER — Other Ambulatory Visit: Payer: Self-pay | Admitting: *Deleted

## 2017-05-07 ENCOUNTER — Ambulatory Visit: Payer: Medicare Other | Admitting: Neurology

## 2017-05-07 MED ORDER — ATORVASTATIN CALCIUM 40 MG PO TABS: 40.0000 mg | ORAL_TABLET | Freq: Every day | ORAL | 3 refills | Status: DC

## 2017-05-07 NOTE — Telephone Encounter (Signed)
LDL 100 checked by [facility] 12/21/2016

## 2017-05-07 NOTE — Telephone Encounter (Signed)
Patients wife called and requested refills on atorvastatin for the patient. Last lipid panel in epic is from 06/24/13. Please advise. Thanks, MI

## 2017-05-08 DIAGNOSIS — M25561 Pain in right knee: Secondary | ICD-10-CM | POA: Diagnosis not present

## 2017-05-08 DIAGNOSIS — M25661 Stiffness of right knee, not elsewhere classified: Secondary | ICD-10-CM | POA: Diagnosis not present

## 2017-05-08 DIAGNOSIS — G8918 Other acute postprocedural pain: Secondary | ICD-10-CM | POA: Diagnosis not present

## 2017-05-08 DIAGNOSIS — Z96651 Presence of right artificial knee joint: Secondary | ICD-10-CM | POA: Diagnosis not present

## 2017-05-08 DIAGNOSIS — R269 Unspecified abnormalities of gait and mobility: Secondary | ICD-10-CM | POA: Diagnosis not present

## 2017-05-08 DIAGNOSIS — R29898 Other symptoms and signs involving the musculoskeletal system: Secondary | ICD-10-CM | POA: Diagnosis not present

## 2017-05-09 ENCOUNTER — Telehealth: Payer: Self-pay

## 2017-05-09 NOTE — Telephone Encounter (Signed)
Rcvd H&P form to be filled out for Pts MRI w/sedation to be filled out. Pt had preadmissioon testing on 04/27/17 for right TKR. Called and spoke with Vivien Rota who schedules the sadation scheduling, advsd her of info from knee surgery by Dr Maureen Ralphs. She will subit and will call back if that is not acceptable.

## 2017-05-10 DIAGNOSIS — M25561 Pain in right knee: Secondary | ICD-10-CM | POA: Diagnosis not present

## 2017-05-10 DIAGNOSIS — Z96651 Presence of right artificial knee joint: Secondary | ICD-10-CM | POA: Diagnosis not present

## 2017-05-10 DIAGNOSIS — R29898 Other symptoms and signs involving the musculoskeletal system: Secondary | ICD-10-CM | POA: Diagnosis not present

## 2017-05-10 DIAGNOSIS — G8918 Other acute postprocedural pain: Secondary | ICD-10-CM | POA: Diagnosis not present

## 2017-05-10 DIAGNOSIS — R269 Unspecified abnormalities of gait and mobility: Secondary | ICD-10-CM | POA: Diagnosis not present

## 2017-05-10 DIAGNOSIS — M25661 Stiffness of right knee, not elsewhere classified: Secondary | ICD-10-CM | POA: Diagnosis not present

## 2017-05-14 ENCOUNTER — Other Ambulatory Visit: Payer: Self-pay

## 2017-05-14 ENCOUNTER — Encounter (HOSPITAL_COMMUNITY): Payer: Self-pay | Admitting: *Deleted

## 2017-05-14 DIAGNOSIS — Z96651 Presence of right artificial knee joint: Secondary | ICD-10-CM | POA: Diagnosis not present

## 2017-05-14 DIAGNOSIS — R269 Unspecified abnormalities of gait and mobility: Secondary | ICD-10-CM | POA: Diagnosis not present

## 2017-05-14 DIAGNOSIS — G8918 Other acute postprocedural pain: Secondary | ICD-10-CM | POA: Diagnosis not present

## 2017-05-14 DIAGNOSIS — M25661 Stiffness of right knee, not elsewhere classified: Secondary | ICD-10-CM | POA: Diagnosis not present

## 2017-05-14 DIAGNOSIS — R29898 Other symptoms and signs involving the musculoskeletal system: Secondary | ICD-10-CM | POA: Diagnosis not present

## 2017-05-14 DIAGNOSIS — M25561 Pain in right knee: Secondary | ICD-10-CM | POA: Diagnosis not present

## 2017-05-14 NOTE — Anesthesia Preprocedure Evaluation (Addendum)
Anesthesia Evaluation  Patient identified by MRN, date of birth, ID band Patient awake    Reviewed: Allergy & Precautions, NPO status , Patient's Chart, lab work & pertinent test results  Airway Mallampati: II  TM Distance: >3 FB Neck ROM: Full    Dental  (+) Edentulous Upper, Upper Dentures   Pulmonary former smoker,    Pulmonary exam normal        Cardiovascular hypertension, + CAD, + Past MI and + Cardiac Stents  Normal cardiovascular exam Rhythm:Regular  - Left ventricle: The cavity size was normal. Posterior wall   thickness was increased in a pattern of moderate LVH. Systolic function was normal. The estimated ejection fraction was 50%. Wall motion was normal; there were no regional wall motion abnormalities. Features are consistent with a pseudonormal left ventricular filling pattern, with concomitant abnormal relaxation and increased filling pressure (grade 2 diastolic dysfunction). Doppler parameters are consistent with high ventricular filling pressure. - Aortic valve: Transvalvular velocity was within the normal range.   There was no stenosis. There was no regurgitation. Valve area   (VTI): 2.85 cm^2. Valve area (Vmax): 2.47 cm^2. Valve area   (Vmean): 2.53 cm^2. - Mitral valve: Mildly calcified annulus. Transvalvular velocity   was within the normal range. There was no evidence for stenosis.   There was mild regurgitation. - Left atrium: The atrium was severely dilated. - Right ventricle: The cavity size was normal. Wall thickness was normal. Systolic function was normal. - Right atrium: The atrium was severely dilated. - Atrial septum: No defect or patent foramen ovale was identified by color flow Doppler. - Tricuspid valve: There was no regurgitation.   Neuro/Psych    GI/Hepatic   Endo/Other    Renal/GU Renal disease     Musculoskeletal  (+) Arthritis ,   Abdominal   Peds  Hematology   Anesthesia Other  Findings   Reproductive/Obstetrics                            Anesthesia Physical  Anesthesia Plan  ASA: III  Anesthesia Plan: General   Post-op Pain Management:    Induction: Intravenous  PONV Risk Score and Plan: 2 and Treatment may vary due to age or medical condition and Ondansetron  Airway Management Planned: Oral ETT and LMA  Additional Equipment:   Intra-op Plan:   Post-operative Plan: Extubation in OR  Informed Consent: I have reviewed the patients History and Physical, chart, labs and discussed the procedure including the risks, benefits and alternatives for the proposed anesthesia with the patient or authorized representative who has indicated his/her understanding and acceptance.     Plan Discussed with: CRNA, Surgeon and Anesthesiologist  Anesthesia Plan Comments:        Anesthesia Quick Evaluation

## 2017-05-14 NOTE — Progress Notes (Signed)
Pt SDW-pre-op call completed by pt spouse [facility]). Spouse denies that pt C/O SOB and chest pain. Spouse stated that pt is under the care of Dr. Johnsie Cancel, Cardiology.  Spouse made aware to have pt stop taking vitamins and herbal medications. Spouse verbalized understanding of all pre-op instructions.

## 2017-05-15 ENCOUNTER — Ambulatory Visit (HOSPITAL_COMMUNITY): Payer: Medicare Other

## 2017-05-15 ENCOUNTER — Encounter (HOSPITAL_COMMUNITY)
Admission: RE | Disposition: A | Discharge status: Home / Self Care | Payer: Self-pay | Source: Ambulatory Visit | Attending: Neurology

## 2017-05-15 ENCOUNTER — Ambulatory Visit (HOSPITAL_COMMUNITY): Payer: Medicare Other | Admitting: Certified Registered Nurse Anesthetist

## 2017-05-15 ENCOUNTER — Encounter (HOSPITAL_COMMUNITY): Payer: Self-pay | Admitting: Certified Registered Nurse Anesthetist

## 2017-05-15 ENCOUNTER — Ambulatory Visit (HOSPITAL_COMMUNITY)
Admission: RE | Admit: 2017-05-15 | Discharge: 2017-05-15 | Disposition: A | Discharge status: Home / Self Care | Payer: Medicare Other | Source: Ambulatory Visit | Attending: Neurology | Admitting: Neurology

## 2017-05-15 DIAGNOSIS — G9520 Unspecified cord compression: Secondary | ICD-10-CM | POA: Insufficient documentation

## 2017-05-15 DIAGNOSIS — M4802 Spinal stenosis, cervical region: Secondary | ICD-10-CM | POA: Diagnosis not present

## 2017-05-15 DIAGNOSIS — G959 Disease of spinal cord, unspecified: Secondary | ICD-10-CM | POA: Diagnosis not present

## 2017-05-15 DIAGNOSIS — M4803 Spinal stenosis, cervicothoracic region: Secondary | ICD-10-CM | POA: Insufficient documentation

## 2017-05-15 DIAGNOSIS — I10 Essential (primary) hypertension: Secondary | ICD-10-CM | POA: Diagnosis not present

## 2017-05-15 DIAGNOSIS — I2510 Atherosclerotic heart disease of native coronary artery without angina pectoris: Secondary | ICD-10-CM | POA: Diagnosis not present

## 2017-05-15 HISTORY — PX: RADIOLOGY WITH ANESTHESIA: SHX6223

## 2017-05-15 HISTORY — DX: Anesthesia of skin: R20.0

## 2017-05-15 HISTORY — DX: Paresthesia of skin: R20.2

## 2017-05-15 SURGERY — MRI WITH ANESTHESIA
Anesthesia: General

## 2017-05-15 MED ORDER — EPHEDRINE SULFATE-NACL 50-0.9 MG/10ML-% IV SOSY
PREFILLED_SYRINGE | INTRAVENOUS | Status: DC | PRN
  Administered 2017-05-15: 30 mg via INTRAVENOUS

## 2017-05-15 MED ORDER — LIDOCAINE 2% (20 MG/ML) 5 ML SYRINGE
INTRAMUSCULAR | Status: DC | PRN
  Administered 2017-05-15: 100 mg via INTRAVENOUS

## 2017-05-15 MED ORDER — SUCCINYLCHOLINE CHLORIDE 200 MG/10ML IV SOSY
PREFILLED_SYRINGE | INTRAVENOUS | Status: DC | PRN
  Administered 2017-05-15: 80 mg via INTRAVENOUS

## 2017-05-15 MED ORDER — LACTATED RINGERS IV SOLN
INTRAVENOUS | Status: DC
  Administered 2017-05-15: 08:00:00 via INTRAVENOUS

## 2017-05-15 MED ORDER — FENTANYL CITRATE (PF) 100 MCG/2ML IJ SOLN
INTRAMUSCULAR | Status: DC | PRN
  Administered 2017-05-15: 50 ug via INTRAVENOUS

## 2017-05-15 MED ORDER — GADOBENATE DIMEGLUMINE 529 MG/ML IV SOLN
20.0000 mL | Freq: Once | INTRAVENOUS | Status: AC
  Administered 2017-05-15: 20 mL via INTRAVENOUS

## 2017-05-15 MED ORDER — PROPOFOL 10 MG/ML IV BOLUS
INTRAVENOUS | Status: DC | PRN
  Administered 2017-05-15: 120 mg via INTRAVENOUS

## 2017-05-15 NOTE — Transfer of Care (Signed)
Immediate Anesthesia Transfer of Care Note  Patient: [facility]ro  Procedure(s) Performed: MRI WITH ANESTHESIA CERVICAL SPINE WITH AND WITHOUT (N/A )  Patient Location: PACU  Anesthesia Type:General  Level of Consciousness: awake, alert , oriented and patient cooperative  Airway & Oxygen Therapy: Patient Spontanous Breathing and Patient connected to nasal cannula oxygen  Post-op Assessment: Report given to RN and Post -op Vital signs reviewed and stable  Post vital signs: stable  Last Vitals:  Vitals:   05/15/17 0647 05/15/17 0953  BP: 136/65 (!) 150/72  Pulse: (!) 55 65  Resp: 17 16  Temp: 37.1 C 36.7 C  SpO2: 97% 95%    Last Pain:  Vitals:   05/15/17 0710  TempSrc:   PainSc: 4       Patients Stated Pain Goal: 4 (32/95/18 8416)  Complications: No apparent anesthesia complications

## 2017-05-16 ENCOUNTER — Encounter (HOSPITAL_COMMUNITY): Payer: Self-pay | Admitting: Radiology

## 2017-05-16 ENCOUNTER — Telehealth: Payer: Self-pay

## 2017-05-16 DIAGNOSIS — R269 Unspecified abnormalities of gait and mobility: Secondary | ICD-10-CM | POA: Diagnosis not present

## 2017-05-16 DIAGNOSIS — G8918 Other acute postprocedural pain: Secondary | ICD-10-CM | POA: Diagnosis not present

## 2017-05-16 DIAGNOSIS — M25561 Pain in right knee: Secondary | ICD-10-CM | POA: Diagnosis not present

## 2017-05-16 DIAGNOSIS — Z96651 Presence of right artificial knee joint: Secondary | ICD-10-CM | POA: Diagnosis not present

## 2017-05-16 DIAGNOSIS — R29898 Other symptoms and signs involving the musculoskeletal system: Secondary | ICD-10-CM | POA: Diagnosis not present

## 2017-05-16 DIAGNOSIS — M25661 Stiffness of right knee, not elsewhere classified: Secondary | ICD-10-CM | POA: Diagnosis not present

## 2017-05-16 NOTE — Telephone Encounter (Signed)
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  This is the cause of his numbness and walking difficulty.  I recommend referral to neurosurgery for evaluation.

## 2017-05-16 NOTE — Telephone Encounter (Signed)
Called and spoke with [facility], she will discuss this with her husband and call back if he wants to proceed with neurosurgeon referral.

## 2017-05-18 DIAGNOSIS — Z96651 Presence of right artificial knee joint: Secondary | ICD-10-CM | POA: Diagnosis not present

## 2017-05-18 DIAGNOSIS — R269 Unspecified abnormalities of gait and mobility: Secondary | ICD-10-CM | POA: Diagnosis not present

## 2017-05-18 DIAGNOSIS — Z9181 History of falling: Secondary | ICD-10-CM | POA: Diagnosis not present

## 2017-05-18 DIAGNOSIS — M25661 Stiffness of right knee, not elsewhere classified: Secondary | ICD-10-CM | POA: Diagnosis not present

## 2017-05-18 DIAGNOSIS — Z471 Aftercare following joint replacement surgery: Secondary | ICD-10-CM | POA: Diagnosis not present

## 2017-05-18 DIAGNOSIS — R531 Weakness: Secondary | ICD-10-CM | POA: Diagnosis not present

## 2017-05-21 DIAGNOSIS — Z471 Aftercare following joint replacement surgery: Secondary | ICD-10-CM | POA: Diagnosis not present

## 2017-05-21 DIAGNOSIS — M25661 Stiffness of right knee, not elsewhere classified: Secondary | ICD-10-CM | POA: Diagnosis not present

## 2017-05-21 DIAGNOSIS — R269 Unspecified abnormalities of gait and mobility: Secondary | ICD-10-CM | POA: Diagnosis not present

## 2017-05-21 DIAGNOSIS — Z96651 Presence of right artificial knee joint: Secondary | ICD-10-CM | POA: Diagnosis not present

## 2017-05-21 DIAGNOSIS — Z9181 History of falling: Secondary | ICD-10-CM | POA: Diagnosis not present

## 2017-05-21 DIAGNOSIS — R531 Weakness: Secondary | ICD-10-CM | POA: Diagnosis not present

## 2017-05-22 DIAGNOSIS — M4712 Other spondylosis with myelopathy, cervical region: Secondary | ICD-10-CM | POA: Diagnosis not present

## 2017-05-22 DIAGNOSIS — M5030 Other cervical disc degeneration, unspecified cervical region: Secondary | ICD-10-CM | POA: Diagnosis not present

## 2017-05-23 DIAGNOSIS — Z96651 Presence of right artificial knee joint: Secondary | ICD-10-CM | POA: Diagnosis not present

## 2017-05-23 DIAGNOSIS — Z9181 History of falling: Secondary | ICD-10-CM | POA: Diagnosis not present

## 2017-05-23 DIAGNOSIS — Z471 Aftercare following joint replacement surgery: Secondary | ICD-10-CM | POA: Diagnosis not present

## 2017-05-23 DIAGNOSIS — M25661 Stiffness of right knee, not elsewhere classified: Secondary | ICD-10-CM | POA: Diagnosis not present

## 2017-05-23 DIAGNOSIS — R531 Weakness: Secondary | ICD-10-CM | POA: Diagnosis not present

## 2017-05-23 DIAGNOSIS — R269 Unspecified abnormalities of gait and mobility: Secondary | ICD-10-CM | POA: Diagnosis not present

## 2017-05-25 DIAGNOSIS — Z471 Aftercare following joint replacement surgery: Secondary | ICD-10-CM | POA: Diagnosis not present

## 2017-05-25 DIAGNOSIS — R531 Weakness: Secondary | ICD-10-CM | POA: Diagnosis not present

## 2017-05-25 DIAGNOSIS — M25661 Stiffness of right knee, not elsewhere classified: Secondary | ICD-10-CM | POA: Diagnosis not present

## 2017-05-25 DIAGNOSIS — Z96651 Presence of right artificial knee joint: Secondary | ICD-10-CM | POA: Diagnosis not present

## 2017-05-25 DIAGNOSIS — Z9181 History of falling: Secondary | ICD-10-CM | POA: Diagnosis not present

## 2017-05-25 DIAGNOSIS — R269 Unspecified abnormalities of gait and mobility: Secondary | ICD-10-CM | POA: Diagnosis not present

## 2017-05-28 DIAGNOSIS — M25661 Stiffness of right knee, not elsewhere classified: Secondary | ICD-10-CM | POA: Diagnosis not present

## 2017-05-28 DIAGNOSIS — R531 Weakness: Secondary | ICD-10-CM | POA: Diagnosis not present

## 2017-05-28 DIAGNOSIS — Z9181 History of falling: Secondary | ICD-10-CM | POA: Diagnosis not present

## 2017-05-28 DIAGNOSIS — R269 Unspecified abnormalities of gait and mobility: Secondary | ICD-10-CM | POA: Diagnosis not present

## 2017-05-28 DIAGNOSIS — Z471 Aftercare following joint replacement surgery: Secondary | ICD-10-CM | POA: Diagnosis not present

## 2017-05-28 DIAGNOSIS — Z96651 Presence of right artificial knee joint: Secondary | ICD-10-CM | POA: Diagnosis not present

## 2017-05-30 DIAGNOSIS — Z9181 History of falling: Secondary | ICD-10-CM | POA: Diagnosis not present

## 2017-05-30 DIAGNOSIS — R531 Weakness: Secondary | ICD-10-CM | POA: Diagnosis not present

## 2017-05-30 DIAGNOSIS — Z96651 Presence of right artificial knee joint: Secondary | ICD-10-CM | POA: Diagnosis not present

## 2017-05-30 DIAGNOSIS — R269 Unspecified abnormalities of gait and mobility: Secondary | ICD-10-CM | POA: Diagnosis not present

## 2017-05-30 DIAGNOSIS — M25661 Stiffness of right knee, not elsewhere classified: Secondary | ICD-10-CM | POA: Diagnosis not present

## 2017-05-30 DIAGNOSIS — Z471 Aftercare following joint replacement surgery: Secondary | ICD-10-CM | POA: Diagnosis not present

## 2017-06-01 DIAGNOSIS — M25661 Stiffness of right knee, not elsewhere classified: Secondary | ICD-10-CM | POA: Diagnosis not present

## 2017-06-01 DIAGNOSIS — Z96651 Presence of right artificial knee joint: Secondary | ICD-10-CM | POA: Diagnosis not present

## 2017-06-01 DIAGNOSIS — Z9181 History of falling: Secondary | ICD-10-CM | POA: Diagnosis not present

## 2017-06-01 DIAGNOSIS — R531 Weakness: Secondary | ICD-10-CM | POA: Diagnosis not present

## 2017-06-01 DIAGNOSIS — Z471 Aftercare following joint replacement surgery: Secondary | ICD-10-CM | POA: Diagnosis not present

## 2017-06-01 DIAGNOSIS — R269 Unspecified abnormalities of gait and mobility: Secondary | ICD-10-CM | POA: Diagnosis not present

## 2017-06-05 DIAGNOSIS — M4712 Other spondylosis with myelopathy, cervical region: Secondary | ICD-10-CM | POA: Diagnosis not present

## 2017-06-05 DIAGNOSIS — Z96651 Presence of right artificial knee joint: Secondary | ICD-10-CM | POA: Diagnosis not present

## 2017-06-05 DIAGNOSIS — M5030 Other cervical disc degeneration, unspecified cervical region: Secondary | ICD-10-CM | POA: Diagnosis not present

## 2017-06-05 DIAGNOSIS — Z471 Aftercare following joint replacement surgery: Secondary | ICD-10-CM | POA: Diagnosis not present

## 2017-06-07 ENCOUNTER — Telehealth: Payer: Self-pay

## 2017-06-07 NOTE — Telephone Encounter (Signed)
Copied from [place] [id number]. Topic: General - Other >> Jun 07, 2017  8:50 AM Yvette Rack wrote: Reason for CRM: patient wife [name] calling stating that she will be faxing over a surgical clearance paper work for Dr Larose Kells to sign and fax back as soon as possible

## 2017-06-07 NOTE — Telephone Encounter (Signed)
Waiting for form

## 2017-06-07 NOTE — Progress Notes (Signed)
Cardiology Office Note    Date:  06/15/2017   ID:  Gregory Guerrero, Gregory Guerrero Dec 29, 1945, MRN [id number]  PCP:  Colon Branch, MD  Cardiologist:  Dr.[name]   Chief Complaint: Dyspnea  History of Present Illness:   72 y.o. remote stenting of LAD in [name] . History of DVT, HTN, HLD and CRF Chronic venous Disease with edema and right sided ulcers in past. Same dyspnea 01/2017 BNP and d dimer up V/Q negative started on lasix with improvement HTN and PVCls improved with beta blocker  Echo reviewed 02/01/17 EF 03% grade 2 diastolic mild MR  ETT 47/4/25 normal   04/30/17 Right TKR Alusio no cardiac complications  Concerned about progressive neuropathy in both upper extremities. No motor deficits Concerned he has cervical neck issue and can't see [facility] until December  MRI 05/15/17 with severe disc and facet degeneration multilevel  Will have cervical decompression with [name] 06/27/17 He did fine with his TKR and is cleared for his spine surgery   Past Medical History:  Diagnosis Date  . CAD (coronary artery disease)   . Chronic kidney disease   . DVT (deep venous thrombosis) (Greg[facility]ro)    following spinal surgery Nov 2017  . HTN (hypertension)   . MI (myocardial infarction) ([facility])    1999 ; stents placed   . Numbness and tingling in both hands     Past Surgical History:  Procedure Laterality Date  . ANTERIOR LAT LUMBAR FUSION N/A 03/01/2016   Procedure: EXTREME LATERAL INTERBODY FUSION  LUMBAR 2-4;  Surgeon: [name], MD;  Location: [facility];  Service: Orthopedics;  Laterality: N/A;  . CARDIAC CATHETERIZATION    . RADIOLOGY WITH ANESTHESIA N/A 05/15/2017   Procedure: MRI WITH ANESTHESIA CERVICAL SPINE WITH AND WITHOUT;  Surgeon: Radiologist, Medication, MD;  Location: [facility];  Service: Radiology;  Laterality: N/A;  . SPINAL FUSION N/A 03/02/2016   Procedure: POSTERIOR SPINAL FUSION INTERBODY L2-S1, DECOMPRESSION L4-S1;  Surgeon: [name], MD;  Location: [facility];   Service: Orthopedics;  Laterality: N/A;  . TOTAL KNEE ARTHROPLASTY Right 04/30/2017   Procedure: RIGHT TOTAL KNEE ARTHROPLASTY;  Surgeon: [name], MD;  Location: [facility];  Service: Orthopedics;  Laterality: Right;    Current Medications: Prior to Admission medications   Medication Sig Start Date End Date Taking? Authorizing Provider  Ascorbic Acid (VITAMIN C) 1000 MG tablet Take 1,000 mg by mouth daily.    [provider]  aspirin EC 81 MG tablet Take 81 mg by mouth daily.    [provider]  furosemide (LASIX) 20 MG tablet Take 1 tablet (20 mg total) by mouth daily. 01/25/17   Colon Branch, MD  losartan (COZAAR) 100 MG tablet Take 1 tablet (100 mg total) by mouth daily. 01/05/17   [name], MD  Multiple Vitamin (MULTIVITAMIN WITH MINERALS) TABS tablet Take 1 tablet by mouth daily.    [provider]  rosuvastatin (CRESTOR) 20 MG tablet Take 1 tablet (20 mg total) by mouth at bedtime. 01/24/17   Colon Branch, MD    Allergies:   Patient has no known allergies.   Social History   Socioeconomic History  . Marital status: Married    Spouse name: None  . Number of children: 2  . Years of education: None  . Highest education level: None  Social Needs  . Financial resource strain: None  . Food insecurity - worry: None  . Food insecurity - inability: None  .  Transportation needs - medical: None  . Transportation needs - non-medical: None  Occupational History  . Occupation: retired , Pension scheme manager busines   Tobacco Use  . Smoking status: Former Smoker    Types: Cigarettes    Last attempt to quit: 04/26/1983    Years since quitting: 34.1  . Smokeless tobacco: Never Used  . Tobacco comment: quit over 40 years ago   Substance and Sexual Activity  . Alcohol use: No  . Drug use: No  . Sexual activity: No  Other Topics Concern  . None  Social History Narrative   Household pt, wife, a Pharmacologist from [facility]      Family History:  The patient's  family history includes Cancer - Other in his mother; Heart attack in his father; Uterine cancer in his sister.  ROS:   Please see the history of present illness.    ROS All other systems reviewed and are negative.   PHYSICAL EXAM:   VS:  BP (!) 154/86   Pulse 61   Ht 5' 8.5" (1.74 m)   Wt 250 lb 4 oz (113.5 kg)   SpO2 97%   BMI 37.50 kg/m    Affect appropriate Overweight white male  HEENT: normal Neck supple with no adenopathy JVP normal no bruits no thyromegaly Lungs clear with no wheezing and good diaphragmatic motion Heart:  S1/S2 no murmur, no rub, gallop or click PMI normal Abdomen: benighn, BS positve, no tenderness, no AAA no bruit.  No HSM or HJR Distal pulses intact with no bruits Plus 2 RLE edema Neuro non-focal Skin warm and dry No muscular weakness Post right TKR    Wt Readings from Last 3 Encounters:  06/15/17 250 lb 4 oz (113.5 kg)  05/15/17 245 lb (111.1 kg)  04/30/17 245 lb (111.1 kg)      Studies/Labs Reviewed:   EKG:    Jan 26, 2017 SR rate 71 normal PVC nonspecific ST changes   Recent Labs: Jan 26, 2017: Pro B Natriuretic peptide (BNP) 295.0 04/27/2017: ALT 25 05/02/2017: BUN 33; Creatinine, Ser 1.52; Hemoglobin 11.1; Platelets 147; Potassium 4.5; Sodium 140   Lipid Panel    Component Value Date/Time   CHOL 178 06/24/2013 1113   TRIG 81.0 06/24/2013 1113   HDL 46.80 06/24/2013 1113   CHOLHDL 4 06/24/2013 1113   VLDL 16.2 06/24/2013 1113   LDLCALC 115 (H) 06/24/2013 1113    Additional studies/ records that were reviewed today include:  AS above    ASSESSMENT & PLAN:     1. CAD s/p remote LAD stenting -  No chest pain. Continue ASA and statin. ETT 02/23/17 non ischemic continue ASA and beta blocker   2. HTN- Well controlled.  Continue current medications and low sodium Dash type diet.    3. HLD-  Continue statin labs with primary   4. CKD stage III  Baseline Cr 1.5 stable   5. Ortho:  Post right TKR continue PT/OT PRN lasix RLE  duplex r/o DVT   6. 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

## 2017-06-08 DIAGNOSIS — Z96651 Presence of right artificial knee joint: Secondary | ICD-10-CM | POA: Diagnosis not present

## 2017-06-08 DIAGNOSIS — R531 Weakness: Secondary | ICD-10-CM | POA: Diagnosis not present

## 2017-06-08 DIAGNOSIS — Z471 Aftercare following joint replacement surgery: Secondary | ICD-10-CM | POA: Diagnosis not present

## 2017-06-08 DIAGNOSIS — Z9181 History of falling: Secondary | ICD-10-CM | POA: Diagnosis not present

## 2017-06-08 DIAGNOSIS — M25661 Stiffness of right knee, not elsewhere classified: Secondary | ICD-10-CM | POA: Diagnosis not present

## 2017-06-08 DIAGNOSIS — R269 Unspecified abnormalities of gait and mobility: Secondary | ICD-10-CM | POA: Diagnosis not present

## 2017-06-08 NOTE — Telephone Encounter (Signed)
Received Medical/Surgical Cl  earance Form from [place] via fax from patient; forwarded to provider with last OV notes and EKG/SLS 02/22

## 2017-06-12 DIAGNOSIS — Z96651 Presence of right artificial knee joint: Secondary | ICD-10-CM | POA: Diagnosis not present

## 2017-06-12 DIAGNOSIS — R531 Weakness: Secondary | ICD-10-CM | POA: Diagnosis not present

## 2017-06-12 DIAGNOSIS — Z9181 History of falling: Secondary | ICD-10-CM | POA: Diagnosis not present

## 2017-06-12 DIAGNOSIS — M25661 Stiffness of right knee, not elsewhere classified: Secondary | ICD-10-CM | POA: Diagnosis not present

## 2017-06-12 DIAGNOSIS — Z471 Aftercare following joint replacement surgery: Secondary | ICD-10-CM | POA: Diagnosis not present

## 2017-06-12 DIAGNOSIS — R269 Unspecified abnormalities of gait and mobility: Secondary | ICD-10-CM | POA: Diagnosis not present

## 2017-06-12 NOTE — Telephone Encounter (Signed)
Received fax confirmation

## 2017-06-12 NOTE — Telephone Encounter (Signed)
Form completed. PCP requesting that Pt be cleared by cardiology as well. Form faxed to [place] at [phone]. Form sent for scanning.

## 2017-06-14 DIAGNOSIS — M25661 Stiffness of right knee, not elsewhere classified: Secondary | ICD-10-CM | POA: Diagnosis not present

## 2017-06-14 DIAGNOSIS — Z9181 History of falling: Secondary | ICD-10-CM | POA: Diagnosis not present

## 2017-06-14 DIAGNOSIS — R531 Weakness: Secondary | ICD-10-CM | POA: Diagnosis not present

## 2017-06-14 DIAGNOSIS — Z471 Aftercare following joint replacement surgery: Secondary | ICD-10-CM | POA: Diagnosis not present

## 2017-06-14 DIAGNOSIS — Z96651 Presence of right artificial knee joint: Secondary | ICD-10-CM | POA: Diagnosis not present

## 2017-06-14 DIAGNOSIS — R269 Unspecified abnormalities of gait and mobility: Secondary | ICD-10-CM | POA: Diagnosis not present

## 2017-06-15 ENCOUNTER — Encounter: Payer: Self-pay | Admitting: Cardiovascular Disease

## 2017-06-15 ENCOUNTER — Ambulatory Visit (INDEPENDENT_AMBULATORY_CARE_PROVIDER_SITE_OTHER): Payer: Medicare Other | Admitting: Cardiovascular Disease

## 2017-06-15 VITALS — BP 154/86 | HR 61 | Ht 68.5 in | Wt 250.2 lb

## 2017-06-15 DIAGNOSIS — I10 Essential (primary) hypertension: Secondary | ICD-10-CM

## 2017-06-15 DIAGNOSIS — I2510 Atherosclerotic heart disease of native coronary artery without angina pectoris: Secondary | ICD-10-CM

## 2017-06-15 DIAGNOSIS — Z86718 Personal history of other venous thrombosis and embolism: Secondary | ICD-10-CM

## 2017-06-15 NOTE — Patient Instructions (Addendum)
Medication Instructions:  Your physician recommends that you continue on your current medications as directed. Please refer to the Current Medication list given to you today.  Labwork: Your physician recommends that you lab work today lipid and liver.   Testing/Procedures: Your physician has requested that you have a lower extremity venous exercise duplex next week.   Follow-Up: Your physician wants you to follow-up in: 3 months with Dr. Johnsie Cancel.    If you need a refill on your cardiac medications before your next appointment, please call your pharmacy.

## 2017-06-16 LAB — HEPATIC FUNCTION PANEL
ALK PHOS: 112 IU/L (ref 39–117)
ALT: 12 IU/L (ref 0–44)
AST: 21 IU/L (ref 0–40)
Albumin: 4.3 g/dL (ref 3.5–4.8)
Bilirubin Total: 1.1 mg/dL (ref 0.0–1.2)
Bilirubin, Direct: 0.29 mg/dL (ref 0.00–0.40)
Total Protein: 6.8 g/dL (ref 6.0–8.5)

## 2017-06-16 LAB — LIPID PANEL
CHOLESTEROL TOTAL: 138 mg/dL (ref 100–199)
Chol/HDL Ratio: 3.4 ratio (ref 0.0–5.0)
HDL: 41 mg/dL (ref >39)
LDL CALC: 74 mg/dL (ref 0–99)
TRIGLYCERIDES: 113 mg/dL (ref 0–149)
VLDL CHOLESTEROL CAL: 23 mg/dL (ref 5–40)

## 2017-06-18 ENCOUNTER — Other Ambulatory Visit: Payer: Self-pay | Admitting: Cardiovascular Disease

## 2017-06-18 ENCOUNTER — Telehealth: Payer: Self-pay

## 2017-06-18 DIAGNOSIS — G8918 Other acute postprocedural pain: Secondary | ICD-10-CM | POA: Diagnosis not present

## 2017-06-18 DIAGNOSIS — Z9181 History of falling: Secondary | ICD-10-CM | POA: Diagnosis not present

## 2017-06-18 DIAGNOSIS — R29898 Other symptoms and signs involving the musculoskeletal system: Secondary | ICD-10-CM | POA: Diagnosis not present

## 2017-06-18 DIAGNOSIS — E785 Hyperlipidemia, unspecified: Secondary | ICD-10-CM

## 2017-06-18 DIAGNOSIS — M25561 Pain in right knee: Secondary | ICD-10-CM | POA: Diagnosis not present

## 2017-06-18 DIAGNOSIS — R531 Weakness: Secondary | ICD-10-CM | POA: Diagnosis not present

## 2017-06-18 DIAGNOSIS — I2510 Atherosclerotic heart disease of native coronary artery without angina pectoris: Secondary | ICD-10-CM

## 2017-06-18 DIAGNOSIS — Z96651 Presence of right artificial knee joint: Secondary | ICD-10-CM | POA: Diagnosis not present

## 2017-06-18 DIAGNOSIS — M25661 Stiffness of right knee, not elsewhere classified: Secondary | ICD-10-CM | POA: Diagnosis not present

## 2017-06-18 DIAGNOSIS — Z79899 Other long term (current) drug therapy: Secondary | ICD-10-CM

## 2017-06-18 DIAGNOSIS — R269 Unspecified abnormalities of gait and mobility: Secondary | ICD-10-CM | POA: Diagnosis not present

## 2017-06-18 DIAGNOSIS — Z86718 Personal history of other venous thrombosis and embolism: Secondary | ICD-10-CM

## 2017-06-18 NOTE — Telephone Encounter (Signed)
-----   Message from [name], MD sent at 06/18/2017  8:02 AM EST ----- Cholesterol is at goal and LFT's are normal F/U labs in 6 months

## 2017-06-18 NOTE — Telephone Encounter (Signed)
Patient aware of lab results. Patient will come in on 12/19/17 for repeat lab work.

## 2017-06-19 ENCOUNTER — Ambulatory Visit (HOSPITAL_COMMUNITY)
Admission: RE | Admit: 2017-06-19 | Discharge: 2017-06-19 | Disposition: A | Discharge status: Home / Self Care | Payer: Medicare Other | Source: Ambulatory Visit | Attending: Internal Medicine | Admitting: Internal Medicine

## 2017-06-19 DIAGNOSIS — Z86718 Personal history of other venous thrombosis and embolism: Secondary | ICD-10-CM | POA: Insufficient documentation

## 2017-06-19 DIAGNOSIS — M5030 Other cervical disc degeneration, unspecified cervical region: Secondary | ICD-10-CM | POA: Diagnosis not present

## 2017-06-19 NOTE — H&P (Addendum)
Patient ID: Gregory Guerrero MRN: [id number] DOB/AGE: 05-02-1945 72 y.o.  Admit date: (Not on file)  Admission Diagnoses:  Cervical myelopathy  HPI: The patient is here today for a pre-operative History and Physical. They are scheduled for ACDF C3-4 on 06-27-17  with Dr. [name] at [facility].  Pt has been cleared by Cardiologist and PCP for surgery.  Past Medical History: Past Medical History:  Diagnosis Date  . CAD (coronary artery disease)   . Chronic kidney disease   . DVT (deep venous thrombosis) ([place])    following spinal surgery Nov 2017  . HTN (hypertension)   . MI (myocardial infarction) ([place])    1999 ; stents placed   . Numbness and tingling in both hands     Surgical History: Past Surgical History:  Procedure Laterality Date  . ANTERIOR LAT LUMBAR FUSION N/A 03/01/2016   Procedure: EXTREME LATERAL INTERBODY FUSION  LUMBAR 2-4;  Surgeon: [name], MD;  Location: [place];  Service: Orthopedics;  Laterality: N/A;  . CARDIAC CATHETERIZATION    . RADIOLOGY WITH ANESTHESIA N/A 05/15/2017   Procedure: MRI WITH ANESTHESIA CERVICAL SPINE WITH AND WITHOUT;  Surgeon: Radiologist, Medication, MD;  Location: [place];  Service: Radiology;  Laterality: N/A;  . SPINAL FUSION N/A 03/02/2016   Procedure: POSTERIOR SPINAL FUSION INTERBODY L2-S1, DECOMPRESSION L4-S1;  Surgeon: [name], MD;  Location: [place];  Service: Orthopedics;  Laterality: N/A;  . TOTAL KNEE ARTHROPLASTY Right 04/30/2017   Procedure: RIGHT TOTAL KNEE ARTHROPLASTY;  Surgeon: [name], MD;  Location: [facility];  Service: Orthopedics;  Laterality: Right;    Family History: Family History  Problem Relation Age of Onset  . Cancer - Other Mother   . Heart attack Father   . Uterine cancer Sister   . Colon cancer Neg Hx   . Prostate cancer Neg Hx     Social History: Social History   Socioeconomic History  . Marital status: Married    Spouse name: Not on file  . Number of children: 2  .  Years of education: Not on file  . Highest education level: Not on file  Social Needs  . Financial resource strain: Not on file  . Food insecurity - worry: Not on file  . Food insecurity - inability: Not on file  . Transportation needs - medical: Not on file  . Transportation needs - non-medical: Not on file  Occupational History  . Occupation: retired , Pension scheme manager busines   Tobacco Use  . Smoking status: Former Smoker    Types: Cigarettes    Last attempt to quit: 04/26/1983    Years since quitting: 34.1  . Smokeless tobacco: Never Used  . Tobacco comment: quit over 40 years ago   Substance and Sexual Activity  . Alcohol use: No  . Drug use: No  . Sexual activity: No  Other Topics Concern  . Not on file  Social History Narrative   Household pt, wife, a Pharmacologist from [place]: Patient has no known allergies.  Medications: I have reviewed the patient's current medications.  Vital Signs: No data found.  Radiology: No results found.  Labs: No results for input(s): WBC, RBC, HCT, PLT in the last 72 hours. No results for input(s): NA, K, CL, CO2, BUN, CREATININE, GLUCOSE, CALCIUM in the last 72 hours. No results for input(s): LABPT, INR in the last 72 hours.  Review of Systems: ROS  Physical Exam: There  is no height or weight on file to calculate BMI.  Physical Exam  Constitutional: He appears well-developed and well-nourished.  HENT:  Head: Normocephalic.  Eyes: Pupils are equal, round, and reactive to light.  Cardiovascular: Normal rate and regular rhythm.  Respiratory: Effort normal and breath sounds normal.  GI: Soft. Bowel sounds are normal.  Neurological: He is alert.  Skin: Skin is warm and dry.  Psychiatric: He has a normal mood and affect. His behavior is normal. Judgment and thought content normal.    He is a pleasant individual, who appears younger than their stated age.  He Is alert and orientated 3.  No shortness of breath, chest  pain.  Abdomen is soft and non-tender, negative loss of bowel and bladder control, no rebound tenderness.  Negative: skin lesions abrasions contusions Peripheral pulses: 1+ and symmetrical. Compartment soft and nontender.  Moderate to significant swelling in the right calf and foot and ankle region.. Gait pattern: Patient is unsteady on his feet.  Requires a walker to maintain his balance.  Broad shuffling gait pattern Assistive devices: Walker Neuro: Positive Babinski test, positive Hoffman test.  Brisk 3+ upper extremity and lower extremity reflexes except for the right knee which is absent (status post total knee replacement).  She has difficulty with manual dexterity can no longer button buttons.  Grip strength weakness is noted bilaterally.  Complains of significant dysesthesias in both upper extremities.  Has global generalized weakness in the upper and lower extremities.  Negative inverted brachioradialis reflex.  Negative clonus. No significant neck pain with palpation motion.  As his baseline low back pain which has not flared up.  He had an MRI of his cervical spine completed on May 15, 2017.  Patient has a cord signal change at C3-4 with significant degenerative disc disease thickening of the ligamentum flavum and hard disc osteophyte producing severe spinal stenosis.  Advanced degenerative disc disease at C4-5 positive canal stenosis but no cord deformity.  Advanced degenerative disc disease C5-6 but no severe canal stenosis.  Advanced degenerative disc disease C6-7 with mild stenosis.  Patient has ankylosis of the C2-3.  According to the radiology report there is no abnormal intrathecal enhancement no intrinsic myelopathy.  Rays taken today in the office demonstrate loss of cervical lordosis.  Multilevel degenerative cervical disease.  No fracture no subluxation.   Assessment and Plan: Risks and benefits of surgery were discussed with the patient. These include: Infection, bleeding,  death, stroke, paralysis, ongoing or worse pain, need for additional surgery, nonunion, leak of spinal fluid, adjacent segment degeneration requiring additional fusion surgery. Pseudoarthrosis (nonunion)requiring supplemental posterior fixation. Throat pain, swallowing difficulties, hoarseness or change in voice.   With respect to disc replacement: Additional risks include heterotopic ossification, inability to place the disc due to technical issues requiring bailout to a fusion procedure.  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  I have again gone over the procedure with the patient and his wife.  I have indicated that the goal of surgery is to prevent worsening of his symptoms, and hopefully improvement.  At this point given his age and underlying medical comorbidities I think the best treatment option is to address the myelopathy.  While he does have advanced degenerative disease at other levels I do not think doing a multilevel cervical procedure is in his best interest.  The patient has not expressed understanding of this and all of their questions  were encouraged and addressed.  We will be moving forward with single level ACDF to address the myelopathy.

## 2017-06-20 ENCOUNTER — Telehealth: Payer: Self-pay

## 2017-06-20 NOTE — Telephone Encounter (Signed)
-----   Message from [name], MD sent at 06/20/2017  8:53 AM EST ----- Regarding: RE: Right Lower Extremity Venous Duplex US no DVT  ----- Message ----- From: Charlaine Dalton, RVT Sent: 06/19/2017   3:04 PM To: [name], MD Subject: Right Lower Extremity Venous Duplex            Today's right lower extremity venous duplex is negative for DVT. Preliminary results given to [place] at [facility] at 3:00, as well.

## 2017-06-20 NOTE — Pre-Procedure Instructions (Signed)
RON BESKE  06/20/2017      [place], [place] - [address] [place] [address] Phone: [phone] Fax: [phone]    Your procedure is scheduled on Wednesday, March 13th               Report to [facility] Admitting at  12 noon             (posted surgery time 2p - 5p)   Call this number if you have problems the morning of surgery:  [phone]   Remember:              4-5 days prior to surgery, STOP TAKING your Vitamins, Herbal Supplements, Anti-inflammatories.   Do not eat food or drink liquids after midnight, Tuesday.   Take these medicines the morning of surgery with A SIP OF WATER : Gabapentin, Metoprolol, Oxycodone   Do not wear jewelry - no rings or watches.  Do not wear lotions, colognes or deodorant.              Men may shave face and neck.  Do not bring valuables to the hospital.  [facility] is not responsible for any belongings or valuables.  Contacts, dentures or bridgework may not be worn into surgery.  Leave your suitcase in the car.  After surgery it may be brought to your room.  For patients admitted to the hospital, discharge time will be determined by your treatment team.  Please read over the following fact sheets that you were given. Pain Booklet, MRSA Information and Surgical Site Infection Prevention     [place]- Preparing For Surgery  Before surgery, you can play an important role. Because skin is not sterile, your skin needs to be as free of germs as possible. You can reduce the number of germs on your skin by washing with CHG (chlorahexidine gluconate) Soap before surgery.  CHG is an antiseptic cleaner which kills germs and bonds with the skin to continue killing germs even after washing.  Please do not use if you have an allergy to CHG or antibacterial soaps. If your skin becomes reddened/irritated stop using the CHG.  Do not shave (including legs and underarms) for at least 48  hours prior to first CHG shower. It is OK to shave your face.  Please follow these instructions carefully.   1. Shower the NIGHT BEFORE SURGERY and the MORNING OF SURGERY with CHG.   2. If you chose to wash your hair, wash your hair first as usual with your normal shampoo.  3. After you shampoo, rinse your hair and body thoroughly to remove the shampoo.  4. Use CHG as you would any other liquid soap. You can apply CHG directly to the skin and wash gently with a scrungie or a clean washcloth.   5. Apply the CHG Soap to your body ONLY FROM THE NECK DOWN.  Do not use on open wounds or open sores. Avoid contact with your eyes, ears, mouth and genitals (private parts). Wash Face and genitals (private parts)  with your normal soap.  6. Wash thoroughly, paying special attention to the area where your surgery will be performed.  7. Thoroughly rinse your body with warm water from the neck down.  8. DO NOT shower/wash with your normal soap after using and rinsing off the CHG Soap.  9. Pat yourself dry with a CLEAN TOWEL.  10. Wear CLEAN PAJAMAS to bed the  night before surgery, wear comfortable clothes the morning of surgery  11. Place CLEAN SHEETS on your bed the night of your first shower and DO NOT SLEEP WITH PETS.    Day of Surgery: Do not apply any deodorants/lotions. Please wear clean clothes to the hospital/surgery center.

## 2017-06-20 NOTE — Telephone Encounter (Signed)
Patient aware of results.

## 2017-06-21 ENCOUNTER — Encounter (HOSPITAL_COMMUNITY): Payer: Self-pay

## 2017-06-21 ENCOUNTER — Encounter (HOSPITAL_COMMUNITY)
Admission: RE | Admit: 2017-06-21 | Discharge: 2017-06-21 | Disposition: A | Discharge status: Home / Self Care | Payer: Medicare Other | Source: Ambulatory Visit | Attending: Orthopedic Surgery | Admitting: Orthopedic Surgery

## 2017-06-21 ENCOUNTER — Other Ambulatory Visit: Payer: Self-pay

## 2017-06-21 DIAGNOSIS — Z9181 History of falling: Secondary | ICD-10-CM | POA: Diagnosis not present

## 2017-06-21 DIAGNOSIS — I129 Hypertensive chronic kidney disease with stage 1 through stage 4 chronic kidney disease, or unspecified chronic kidney disease: Secondary | ICD-10-CM | POA: Diagnosis not present

## 2017-06-21 DIAGNOSIS — Z87891 Personal history of nicotine dependence: Secondary | ICD-10-CM | POA: Diagnosis not present

## 2017-06-21 DIAGNOSIS — I2510 Atherosclerotic heart disease of native coronary artery without angina pectoris: Secondary | ICD-10-CM | POA: Insufficient documentation

## 2017-06-21 DIAGNOSIS — Z86718 Personal history of other venous thrombosis and embolism: Secondary | ICD-10-CM | POA: Insufficient documentation

## 2017-06-21 DIAGNOSIS — Z7982 Long term (current) use of aspirin: Secondary | ICD-10-CM | POA: Diagnosis not present

## 2017-06-21 DIAGNOSIS — Z955 Presence of coronary angioplasty implant and graft: Secondary | ICD-10-CM | POA: Diagnosis not present

## 2017-06-21 DIAGNOSIS — R269 Unspecified abnormalities of gait and mobility: Secondary | ICD-10-CM | POA: Diagnosis not present

## 2017-06-21 DIAGNOSIS — Z981 Arthrodesis status: Secondary | ICD-10-CM | POA: Insufficient documentation

## 2017-06-21 DIAGNOSIS — G8918 Other acute postprocedural pain: Secondary | ICD-10-CM | POA: Diagnosis not present

## 2017-06-21 DIAGNOSIS — Z01818 Encounter for other preprocedural examination: Secondary | ICD-10-CM | POA: Diagnosis not present

## 2017-06-21 DIAGNOSIS — Z79899 Other long term (current) drug therapy: Secondary | ICD-10-CM | POA: Insufficient documentation

## 2017-06-21 DIAGNOSIS — N189 Chronic kidney disease, unspecified: Secondary | ICD-10-CM | POA: Insufficient documentation

## 2017-06-21 DIAGNOSIS — M25561 Pain in right knee: Secondary | ICD-10-CM | POA: Diagnosis not present

## 2017-06-21 DIAGNOSIS — R29898 Other symptoms and signs involving the musculoskeletal system: Secondary | ICD-10-CM | POA: Diagnosis not present

## 2017-06-21 DIAGNOSIS — Z96651 Presence of right artificial knee joint: Secondary | ICD-10-CM | POA: Insufficient documentation

## 2017-06-21 HISTORY — DX: Unspecified osteoarthritis, unspecified site: M19.90

## 2017-06-21 LAB — BASIC METABOLIC PANEL
Anion gap: 9 (ref 5–15)
BUN: 25 mg/dL — ABNORMAL HIGH (ref 6–20)
CALCIUM: 9.1 mg/dL (ref 8.9–10.3)
CO2: 22 mmol/L (ref 22–32)
Chloride: 110 mmol/L (ref 101–111)
Creatinine, Ser: 1.44 mg/dL — ABNORMAL HIGH (ref 0.61–1.24)
GFR calc non Af Amer: 47 mL/min — ABNORMAL LOW (ref >60)
GFR, EST AFRICAN AMERICAN: 54 mL/min — AB (ref >60)
Glucose, Bld: 85 mg/dL (ref 65–99)
Potassium: 4.8 mmol/L (ref 3.5–5.1)
SODIUM: 141 mmol/L (ref 135–145)

## 2017-06-21 LAB — CBC
HCT: 38.6 % — ABNORMAL LOW (ref 39.0–52.0)
HEMOGLOBIN: 12.5 g/dL — AB (ref 13.0–17.0)
MCH: 32.1 pg (ref 26.0–34.0)
MCHC: 32.4 g/dL (ref 30.0–36.0)
MCV: 99.2 fL (ref 78.0–100.0)
Platelets: 179 10*3/uL (ref 150–400)
RBC: 3.89 MIL/uL — ABNORMAL LOW (ref 4.22–5.81)
RDW: 13.9 % (ref 11.5–15.5)
WBC: 5.9 10*3/uL (ref 4.0–10.5)

## 2017-06-21 LAB — SURGICAL PCR SCREEN
MRSA, PCR: NEGATIVE
STAPHYLOCOCCUS AUREUS: NEGATIVE

## 2017-06-21 NOTE — Progress Notes (Signed)
   06/21/17 1028  OBSTRUCTIVE SLEEP APNEA  Have you ever been diagnosed with sleep apnea through a sleep study? No  Do you snore loudly (loud enough to be heard through closed doors)?  0  Do you often feel tired, fatigued, or sleepy during the daytime (such as falling asleep during driving or talking to someone)? 0  Has anyone observed you stop breathing during your sleep? 0  Do you have, or are you being treated for high blood pressure? 1  BMI more than 35 kg/m2? 1  Age > 50 (1-yes) 1  Neck circumference greater than:Male 16 inches or larger, Male 17inches or larger? 1  Male Gender (Yes=1) 1  Obstructive Sleep Apnea Score 5  Score 5 or greater  Results sent to PCP

## 2017-06-21 NOTE — Progress Notes (Addendum)
PCP is Dr. French Ana   LOV 02/2017 Cardio is Dr. Johnsie Cancel  [place] 06/2017.  Recently had ultrasound 3/6 to detect DVT (-)  (had one happen with back surgery in 2017) Cardiac Cath Echo 01/2017 Clearance note in chart from 2/26

## 2017-06-22 NOTE — Progress Notes (Signed)
Anesthesia Chart Review:  Patient is a 72 year old male scheduled for C3-4 ACDF on 06/27/2017 with [name], MD  - PCP is Kathlene November, MD - Cardiologist is Jenkins Rouge, MD who cleared pt for surgery at last office visit 06/15/17  PMH includes: CAD (remote stent to LAD in [place]), HTN, CKD, DVT.  Former smoker (quit 1985).  BMI 37.  S/p R TKA 04/30/17. S/p lumbar fusion 03/02/16 and 03/01/16.   Medications include: ASA 81 mg, Lipitor, Lasix, losartan, metoprolol  BP (!) 143/82   Pulse (!) 59   Temp 36.7 C   Resp 20   Ht 5' 8.5" (1.74 m)   Wt 248 lb 12.8 oz (112.9 kg)   SpO2 97%   BMI 37.28 kg/m   Preoperative labs reviewed.    CXR 01/24/17: Trace bilateral pleural effusions.  EKG 06/15/17: SR rate 71 normal PVC nonspecific ST changes   Exercise tolerance test 02/23/17:  Blood pressure demonstrated a hypertensive response to exercise.  There was no ST segment deviation noted during stress.  Echo 02/01/17:  - Left ventricle: The cavity size was normal. Posterior wall thickness was increased in a pattern of moderate LVH. Systolic function was normal. The estimated ejection fraction was 50%. Wall motion was normal; there were no regional wall motion abnormalities. Features are consistent with a pseudonormal left ventricular filling pattern, with concomitant abnormal relaxation and increased filling pressure (grade 2 diastolic dysfunction). Doppler parameters are consistent with high ventricular filling   pressure. - Aortic valve: Transvalvular velocity was within the normal range. There was no stenosis. There was no regurgitation. Valve area (VTI): 2.85 cm^2. Valve area (Vmax): 2.47 cm^2. Valve area (Vmean): 2.53 cm^2. - Mitral valve: Mildly calcified annulus. Transvalvular velocity was within the normal range. There was no evidence for stenosis. There was mild regurgitation. - Left atrium: The atrium was severely dilated. - Right ventricle: The cavity size was normal. Wall  thickness was normal. Systolic function was normal. - Right atrium: The atrium was severely dilated. - Atrial septum: No defect or patent foramen ovale was identified by color flow Doppler. - Tricuspid valve: There was no regurgitation.  If no changes, I anticipate pt can proceed with surgery as scheduled.   Willeen Cass, FNP-BC [facility]/Anesthesiology Phone: [phone] 06/22/2017 11:47 AM

## 2017-06-27 ENCOUNTER — Observation Stay (HOSPITAL_COMMUNITY)
Admission: RE | Admit: 2017-06-27 | Discharge: 2017-06-28 | Disposition: A | Discharge status: Home / Self Care | Payer: Medicare Other | Source: Ambulatory Visit | Attending: Orthopedic Surgery | Admitting: Orthopedic Surgery

## 2017-06-27 ENCOUNTER — Ambulatory Visit (HOSPITAL_COMMUNITY): Payer: Medicare Other | Admitting: Emergency Medicine

## 2017-06-27 ENCOUNTER — Ambulatory Visit (HOSPITAL_COMMUNITY): Payer: Medicare Other | Admitting: Anesthesiology

## 2017-06-27 ENCOUNTER — Encounter (HOSPITAL_COMMUNITY): Payer: Self-pay

## 2017-06-27 ENCOUNTER — Ambulatory Visit (HOSPITAL_COMMUNITY): Payer: Medicare Other

## 2017-06-27 ENCOUNTER — Encounter (HOSPITAL_COMMUNITY)
Admission: RE | Disposition: A | Discharge status: Home / Self Care | Payer: Self-pay | Source: Ambulatory Visit | Attending: Orthopedic Surgery

## 2017-06-27 ENCOUNTER — Other Ambulatory Visit: Payer: Self-pay

## 2017-06-27 DIAGNOSIS — Z79899 Other long term (current) drug therapy: Secondary | ICD-10-CM | POA: Diagnosis not present

## 2017-06-27 DIAGNOSIS — M47812 Spondylosis without myelopathy or radiculopathy, cervical region: Secondary | ICD-10-CM | POA: Diagnosis not present

## 2017-06-27 DIAGNOSIS — Z8049 Family history of malignant neoplasm of other genital organs: Secondary | ICD-10-CM | POA: Insufficient documentation

## 2017-06-27 DIAGNOSIS — I2510 Atherosclerotic heart disease of native coronary artery without angina pectoris: Secondary | ICD-10-CM | POA: Insufficient documentation

## 2017-06-27 DIAGNOSIS — Z419 Encounter for procedure for purposes other than remedying health state, unspecified: Secondary | ICD-10-CM

## 2017-06-27 DIAGNOSIS — Z7982 Long term (current) use of aspirin: Secondary | ICD-10-CM | POA: Insufficient documentation

## 2017-06-27 DIAGNOSIS — I252 Old myocardial infarction: Secondary | ICD-10-CM | POA: Diagnosis not present

## 2017-06-27 DIAGNOSIS — M4712 Other spondylosis with myelopathy, cervical region: Principal | ICD-10-CM | POA: Insufficient documentation

## 2017-06-27 DIAGNOSIS — Z87891 Personal history of nicotine dependence: Secondary | ICD-10-CM | POA: Diagnosis not present

## 2017-06-27 DIAGNOSIS — I129 Hypertensive chronic kidney disease with stage 1 through stage 4 chronic kidney disease, or unspecified chronic kidney disease: Secondary | ICD-10-CM | POA: Diagnosis not present

## 2017-06-27 DIAGNOSIS — Z96651 Presence of right artificial knee joint: Secondary | ICD-10-CM | POA: Diagnosis not present

## 2017-06-27 DIAGNOSIS — I340 Nonrheumatic mitral (valve) insufficiency: Secondary | ICD-10-CM | POA: Diagnosis not present

## 2017-06-27 DIAGNOSIS — N189 Chronic kidney disease, unspecified: Secondary | ICD-10-CM | POA: Insufficient documentation

## 2017-06-27 DIAGNOSIS — M48061 Spinal stenosis, lumbar region without neurogenic claudication: Secondary | ICD-10-CM | POA: Diagnosis not present

## 2017-06-27 DIAGNOSIS — Z981 Arthrodesis status: Secondary | ICD-10-CM | POA: Diagnosis not present

## 2017-06-27 DIAGNOSIS — M4302 Spondylolysis, cervical region: Secondary | ICD-10-CM | POA: Diagnosis not present

## 2017-06-27 DIAGNOSIS — I10 Essential (primary) hypertension: Secondary | ICD-10-CM | POA: Diagnosis not present

## 2017-06-27 DIAGNOSIS — M4322 Fusion of spine, cervical region: Secondary | ICD-10-CM | POA: Diagnosis not present

## 2017-06-27 DIAGNOSIS — Z8249 Family history of ischemic heart disease and other diseases of the circulatory system: Secondary | ICD-10-CM | POA: Diagnosis not present

## 2017-06-27 HISTORY — PX: ANTERIOR CERVICAL DECOMP/DISCECTOMY FUSION: SHX1161

## 2017-06-27 HISTORY — DX: Spondylosis without myelopathy or radiculopathy, cervical region: M47.812

## 2017-06-27 SURGERY — ANTERIOR CERVICAL DECOMPRESSION/DISCECTOMY FUSION 1 LEVEL
Anesthesia: General

## 2017-06-27 MED ORDER — FENTANYL CITRATE (PF) 100 MCG/2ML IJ SOLN: 25.0000 ug | INTRAMUSCULAR | Status: DC | PRN

## 2017-06-27 MED ORDER — METHOCARBAMOL 1000 MG/10ML IJ SOLN: 500.0000 mg | Freq: Four times a day (QID) | INTRAMUSCULAR | Status: DC | PRN

## 2017-06-27 MED ORDER — PROPOFOL 500 MG/50ML IV EMUL
INTRAVENOUS | Status: DC | PRN
  Administered 2017-06-27: 50 ug/kg/min via INTRAVENOUS

## 2017-06-27 MED ORDER — ONDANSETRON HCL 4 MG/2ML IJ SOLN: 4.0000 mg | Freq: Four times a day (QID) | INTRAMUSCULAR | Status: DC | PRN

## 2017-06-27 MED ORDER — LOSARTAN POTASSIUM 50 MG PO TABS
100.0000 mg | ORAL_TABLET | Freq: Every day | ORAL | Status: DC
  Administered 2017-06-27 – 2017-06-28 (×2): 100 mg via ORAL
  Filled 2017-06-27 (×2): qty 2

## 2017-06-27 MED ORDER — MAGNESIUM CITRATE PO SOLN: 1.0000 | Freq: Once | ORAL | Status: DC | PRN

## 2017-06-27 MED ORDER — OXYCODONE-ACETAMINOPHEN 10-325 MG PO TABS: 1.0000 | ORAL_TABLET | ORAL | 0 refills | Status: DC | PRN

## 2017-06-27 MED ORDER — OXYCODONE HCL 5 MG PO TABS
10.0000 mg | ORAL_TABLET | ORAL | Status: DC | PRN
  Administered 2017-06-28: 10 mg via ORAL
  Filled 2017-06-27 (×2): qty 2

## 2017-06-27 MED ORDER — MEPERIDINE HCL 50 MG/ML IJ SOLN: 6.2500 mg | INTRAMUSCULAR | Status: DC | PRN

## 2017-06-27 MED ORDER — FENTANYL CITRATE (PF) 100 MCG/2ML IJ SOLN
INTRAMUSCULAR | Status: DC | PRN
  Administered 2017-06-27: 100 ug via INTRAVENOUS
  Administered 2017-06-27 (×3): 50 ug via INTRAVENOUS

## 2017-06-27 MED ORDER — BUPIVACAINE-EPINEPHRINE 0.25% -1:200000 IJ SOLN
INTRAMUSCULAR | Status: DC | PRN
  Administered 2017-06-27: 9 mL

## 2017-06-27 MED ORDER — SODIUM CHLORIDE 0.9% FLUSH
3.0000 mL | Freq: Two times a day (BID) | INTRAVENOUS | Status: DC
  Administered 2017-06-28: 3 mL via INTRAVENOUS

## 2017-06-27 MED ORDER — MENTHOL 3 MG MT LOZG: 1.0000 | LOZENGE | OROMUCOSAL | Status: DC | PRN

## 2017-06-27 MED ORDER — LIDOCAINE HCL (CARDIAC) 20 MG/ML IV SOLN
INTRAVENOUS | Status: DC | PRN
  Administered 2017-06-27: 100 mg via INTRAVENOUS

## 2017-06-27 MED ORDER — FENTANYL CITRATE (PF) 250 MCG/5ML IJ SOLN
INTRAMUSCULAR | Status: AC
  Filled 2017-06-27: qty 5

## 2017-06-27 MED ORDER — PROMETHAZINE HCL 25 MG/ML IJ SOLN: 6.2500 mg | INTRAMUSCULAR | Status: DC | PRN

## 2017-06-27 MED ORDER — 0.9 % SODIUM CHLORIDE (POUR BTL) OPTIME
TOPICAL | Status: DC | PRN
  Administered 2017-06-27 (×3): 1000 mL

## 2017-06-27 MED ORDER — PROPOFOL 1000 MG/100ML IV EMUL
INTRAVENOUS | Status: AC
  Filled 2017-06-27: qty 100

## 2017-06-27 MED ORDER — ACETAMINOPHEN 325 MG PO TABS
650.0000 mg | ORAL_TABLET | ORAL | Status: DC | PRN
  Administered 2017-06-27: 650 mg via ORAL
  Filled 2017-06-27: qty 2

## 2017-06-27 MED ORDER — CEFAZOLIN SODIUM-DEXTROSE 2-4 GM/100ML-% IV SOLN
INTRAVENOUS | Status: AC
  Filled 2017-06-27: qty 100

## 2017-06-27 MED ORDER — SODIUM CHLORIDE 0.9 % IV SOLN: 250.0000 mL | INTRAVENOUS | Status: DC

## 2017-06-27 MED ORDER — SODIUM CHLORIDE 0.9% FLUSH: 3.0000 mL | INTRAVENOUS | Status: DC | PRN

## 2017-06-27 MED ORDER — THROMBIN (RECOMBINANT) 20000 UNITS EX SOLR
CUTANEOUS | Status: AC
  Filled 2017-06-27: qty 20000

## 2017-06-27 MED ORDER — PROPOFOL 10 MG/ML IV BOLUS
INTRAVENOUS | Status: DC | PRN
  Administered 2017-06-27: 200 mg via INTRAVENOUS

## 2017-06-27 MED ORDER — FUROSEMIDE 20 MG PO TABS
20.0000 mg | ORAL_TABLET | ORAL | Status: DC
Start: 2017-06-29 — End: 2017-06-28

## 2017-06-27 MED ORDER — THROMBIN 20000 UNITS EX KIT
PACK | CUTANEOUS | Status: DC | PRN
  Administered 2017-06-27: 20000 [IU] via TOPICAL

## 2017-06-27 MED ORDER — HYDROMORPHONE HCL 1 MG/ML IJ SOLN: 0.2500 mg | INTRAMUSCULAR | Status: DC | PRN

## 2017-06-27 MED ORDER — THROMBIN (RECOMBINANT) 20000 UNITS EX SOLR
CUTANEOUS | Status: DC | PRN
  Administered 2017-06-27: 16:00:00 via TOPICAL

## 2017-06-27 MED ORDER — ONDANSETRON HCL 4 MG/2ML IJ SOLN
INTRAMUSCULAR | Status: DC | PRN
  Administered 2017-06-27: 4 mg via INTRAVENOUS

## 2017-06-27 MED ORDER — ATORVASTATIN CALCIUM 20 MG PO TABS
40.0000 mg | ORAL_TABLET | Freq: Every day | ORAL | Status: DC
  Administered 2017-06-28: 40 mg via ORAL
  Filled 2017-06-27: qty 2

## 2017-06-27 MED ORDER — DEXTROSE 5 % IV SOLN
INTRAVENOUS | Status: DC | PRN
  Administered 2017-06-27: 40 ug/min via INTRAVENOUS

## 2017-06-27 MED ORDER — ONDANSETRON HCL 4 MG/2ML IJ SOLN
INTRAMUSCULAR | Status: AC
  Filled 2017-06-27: qty 2

## 2017-06-27 MED ORDER — PROPOFOL 10 MG/ML IV BOLUS
INTRAVENOUS | Status: AC
  Filled 2017-06-27: qty 20

## 2017-06-27 MED ORDER — SUCCINYLCHOLINE CHLORIDE 20 MG/ML IJ SOLN
INTRAMUSCULAR | Status: DC | PRN
  Administered 2017-06-27: 120 mg via INTRAVENOUS

## 2017-06-27 MED ORDER — OXYCODONE HCL 5 MG PO TABS
5.0000 mg | ORAL_TABLET | ORAL | Status: DC | PRN
  Administered 2017-06-27 – 2017-06-28 (×2): 5 mg via ORAL
  Filled 2017-06-27: qty 1

## 2017-06-27 MED ORDER — ACETAMINOPHEN 650 MG RE SUPP: 650.0000 mg | RECTAL | Status: DC | PRN

## 2017-06-27 MED ORDER — PHENOL 1.4 % MT LIQD: 1.0000 | OROMUCOSAL | Status: DC | PRN

## 2017-06-27 MED ORDER — LACTATED RINGERS IV SOLN
INTRAVENOUS | Status: DC
Start: 2017-06-27 — End: 2017-06-28
  Administered 2017-06-27 (×3): via INTRAVENOUS

## 2017-06-27 MED ORDER — MIDAZOLAM HCL 2 MG/2ML IJ SOLN
INTRAMUSCULAR | Status: AC
  Filled 2017-06-27: qty 2

## 2017-06-27 MED ORDER — METHOCARBAMOL 500 MG PO TABS: 500.0000 mg | ORAL_TABLET | Freq: Three times a day (TID) | ORAL | 0 refills | Status: DC

## 2017-06-27 MED ORDER — METOPROLOL TARTRATE 25 MG PO TABS
25.0000 mg | ORAL_TABLET | Freq: Two times a day (BID) | ORAL | Status: DC
  Administered 2017-06-27 – 2017-06-28 (×2): 25 mg via ORAL
  Filled 2017-06-27 (×2): qty 1

## 2017-06-27 MED ORDER — CEFAZOLIN SODIUM-DEXTROSE 2-4 GM/100ML-% IV SOLN
2.0000 g | INTRAVENOUS | Status: AC
  Administered 2017-06-27: 2 g via INTRAVENOUS

## 2017-06-27 MED ORDER — ONDANSETRON 4 MG PO TBDP: 4.0000 mg | ORAL_TABLET | Freq: Three times a day (TID) | ORAL | 0 refills | Status: DC | PRN

## 2017-06-27 MED ORDER — DEXAMETHASONE SODIUM PHOSPHATE 4 MG/ML IJ SOLN
INTRAMUSCULAR | Status: DC | PRN
  Administered 2017-06-27: 10 mg via INTRAVENOUS

## 2017-06-27 MED ORDER — GABAPENTIN 300 MG PO CAPS
300.0000 mg | ORAL_CAPSULE | Freq: Three times a day (TID) | ORAL | Status: DC
  Administered 2017-06-27 – 2017-06-28 (×2): 300 mg via ORAL
  Filled 2017-06-27 (×2): qty 1

## 2017-06-27 MED ORDER — POLYETHYLENE GLYCOL 3350 17 G PO PACK: 17.0000 g | PACK | Freq: Every day | ORAL | Status: DC | PRN

## 2017-06-27 MED ORDER — ONDANSETRON HCL 4 MG PO TABS: 4.0000 mg | ORAL_TABLET | Freq: Four times a day (QID) | ORAL | Status: DC | PRN

## 2017-06-27 MED ORDER — CEFAZOLIN SODIUM-DEXTROSE 2-4 GM/100ML-% IV SOLN
2.0000 g | Freq: Three times a day (TID) | INTRAVENOUS | Status: AC
  Administered 2017-06-27 – 2017-06-28 (×2): 2 g via INTRAVENOUS
  Filled 2017-06-27 (×2): qty 100

## 2017-06-27 MED ORDER — DOCUSATE SODIUM 100 MG PO CAPS
100.0000 mg | ORAL_CAPSULE | Freq: Two times a day (BID) | ORAL | Status: DC
  Administered 2017-06-27 – 2017-06-28 (×2): 100 mg via ORAL
  Filled 2017-06-27 (×2): qty 1

## 2017-06-27 MED ORDER — DEXAMETHASONE SODIUM PHOSPHATE 10 MG/ML IJ SOLN
INTRAMUSCULAR | Status: AC
  Filled 2017-06-27: qty 1

## 2017-06-27 MED ORDER — MORPHINE SULFATE (PF) 4 MG/ML IV SOLN: 2.0000 mg | INTRAVENOUS | Status: DC | PRN

## 2017-06-27 MED ORDER — LACTATED RINGERS IV SOLN: INTRAVENOUS | Status: DC

## 2017-06-27 MED ORDER — HEMOSTATIC AGENTS (NO CHARGE) OPTIME
TOPICAL | Status: DC | PRN
  Administered 2017-06-27: 1

## 2017-06-27 MED ORDER — METHOCARBAMOL 500 MG PO TABS: 500.0000 mg | ORAL_TABLET | Freq: Four times a day (QID) | ORAL | Status: DC | PRN

## 2017-06-27 SURGICAL SUPPLY — 66 items
BLADE CLIPPER SURG (BLADE) ×3 IMPLANT
BONE VIVIGEN FORMABLE 1.3CC (Bone Implant) ×3 IMPLANT
CABLE BIPOLOR RESECTION CORD (MISCELLANEOUS) ×3 IMPLANT
CAGE SPNL 6D 14XMED 16X7X (Cage) ×1 IMPLANT
CANISTER SUCT 3000ML PPV (MISCELLANEOUS) ×3 IMPLANT
CLOSURE STERI-STRIP 1/2X4 (GAUZE/BANDAGES/DRESSINGS) ×1
CLSR STERI-STRIP ANTIMIC 1/2X4 (GAUZE/BANDAGES/DRESSINGS) ×2 IMPLANT
COVER MAYO STAND STRL (DRAPES) ×6 IMPLANT
COVER SURGICAL LIGHT HANDLE (MISCELLANEOUS) ×3 IMPLANT
CRADLE DONUT ADULT HEAD (MISCELLANEOUS) ×3 IMPLANT
DRAIN TLS ROUND 10FR (DRAIN) ×3 IMPLANT
DRAPE C-ARM 42X72 X-RAY (DRAPES) ×3 IMPLANT
DRAPE MICROSCOPE LEICA 46X105 (MISCELLANEOUS) ×3 IMPLANT
DRAPE POUCH INSTRU U-SHP 10X18 (DRAPES) ×3 IMPLANT
DRAPE SURG 17X23 STRL (DRAPES) ×3 IMPLANT
DRAPE U-SHAPE 47X51 STRL (DRAPES) ×3 IMPLANT
DRSG OPSITE POSTOP 3X4 (GAUZE/BANDAGES/DRESSINGS) ×3 IMPLANT
DURAPREP 26ML APPLICATOR (WOUND CARE) ×3 IMPLANT
ELECT COATED BLADE 2.86 ST (ELECTRODE) ×3 IMPLANT
ELECT PENCIL ROCKER SW 15FT (MISCELLANEOUS) ×3 IMPLANT
ELECT REM PT RETURN 9FT ADLT (ELECTROSURGICAL) ×3
ELECTRODE REM PT RTRN 9FT ADLT (ELECTROSURGICAL) ×1 IMPLANT
FEE INTRAOP MONITOR IMPULS NCS (MISCELLANEOUS) ×1 IMPLANT
FLOSEAL 10ML (HEMOSTASIS) ×3 IMPLANT
FUSION TCS NANOLOCK 7MM 6DEG (Cage) ×2 IMPLANT
GLOVE BIO SURGEON STRL SZ 6.5 (GLOVE) ×2 IMPLANT
GLOVE BIO SURGEONS STRL SZ 6.5 (GLOVE) ×1
GLOVE BIOGEL PI IND STRL 6.5 (GLOVE) ×1 IMPLANT
GLOVE BIOGEL PI IND STRL 8.5 (GLOVE) ×1 IMPLANT
GLOVE BIOGEL PI INDICATOR 6.5 (GLOVE) ×2
GLOVE BIOGEL PI INDICATOR 8.5 (GLOVE) ×2
GLOVE SS BIOGEL STRL SZ 8.5 (GLOVE) ×1 IMPLANT
GLOVE SUPERSENSE BIOGEL SZ 8.5 (GLOVE) ×2
GOWN STRL REUS W/ TWL LRG LVL3 (GOWN DISPOSABLE) ×1 IMPLANT
GOWN STRL REUS W/TWL 2XL LVL3 (GOWN DISPOSABLE) ×6 IMPLANT
GOWN STRL REUS W/TWL LRG LVL3 (GOWN DISPOSABLE) ×2
IMPLANT MEDIUM TCS 7MM (Cage) ×3 IMPLANT
INTRAOP MONITOR FEE IMPULS NCS (MISCELLANEOUS) ×1
INTRAOP MONITOR FEE IMPULSE (MISCELLANEOUS) ×2
KIT BASIN OR (CUSTOM PROCEDURE TRAY) ×3 IMPLANT
KIT ROOM TURNOVER OR (KITS) ×3 IMPLANT
NEEDLE HYPO 22GX1.5 SAFETY (NEEDLE) ×3 IMPLANT
NEEDLE SPNL 18GX3.5 QUINCKE PK (NEEDLE) ×3 IMPLANT
NS IRRIG 1000ML POUR BTL (IV SOLUTION) ×9 IMPLANT
PACK ORTHO CERVICAL (CUSTOM PROCEDURE TRAY) ×3 IMPLANT
PACK UNIVERSAL I (CUSTOM PROCEDURE TRAY) ×3 IMPLANT
PAD ARMBOARD 7.5X6 YLW CONV (MISCELLANEOUS) ×6 IMPLANT
PATTIES SURGICAL .25X.25 (GAUZE/BANDAGES/DRESSINGS) IMPLANT
PIN DISTRACTION 14 (PIN) ×6 IMPLANT
RESTRAINT LIMB HOLDER UNIV (RESTRAINTS) ×3 IMPLANT
RUBBERBAND STERILE (MISCELLANEOUS) IMPLANT
SCREW ENDO BONE 3.8X14MM (Screw) ×6 IMPLANT
SPONGE INTESTINAL PEANUT (DISPOSABLE) ×3 IMPLANT
SPONGE SURGIFOAM ABS GEL 100 (HEMOSTASIS) ×3 IMPLANT
SUT BONE WAX W31G (SUTURE) ×3 IMPLANT
SUT MON AB 3-0 SH 27 (SUTURE) ×2
SUT MON AB 3-0 SH27 (SUTURE) ×1 IMPLANT
SUT SILK 2 0 SH (SUTURE) ×3 IMPLANT
SUT VIC AB 2-0 CT1 18 (SUTURE) ×6 IMPLANT
SYR BULB IRRIGATION 50ML (SYRINGE) ×3 IMPLANT
SYR CONTROL 10ML LL (SYRINGE) ×3 IMPLANT
TAPE CLOTH 4X10 WHT NS (GAUZE/BANDAGES/DRESSINGS) ×3 IMPLANT
TAPE UMBILICAL COTTON 1/8X30 (MISCELLANEOUS) ×3 IMPLANT
TOWEL GREEN STERILE (TOWEL DISPOSABLE) ×3 IMPLANT
TOWEL GREEN STERILE FF (TOWEL DISPOSABLE) ×3 IMPLANT
WATER STERILE IRR 1000ML POUR (IV SOLUTION) IMPLANT

## 2017-06-27 NOTE — Anesthesia Preprocedure Evaluation (Addendum)
Anesthesia Evaluation  Patient identified by MRN, date of birth, ID band Patient awake    Reviewed: Allergy & Precautions, NPO status , Patient's Chart, lab work & pertinent test results  Airway Mallampati: II  TM Distance: <3 FB Neck ROM: Limited    Dental no notable dental hx.    Pulmonary neg pulmonary ROS, former smoker,    Pulmonary exam normal breath sounds clear to auscultation       Cardiovascular hypertension, Pt. on medications and Pt. on home beta blockers + Past MI and + Cardiac Stents  Normal cardiovascular exam Rhythm:Regular Rate:Normal  Left ventricle: The cavity size was normal. Posterior wall   thickness was increased in a pattern of moderate LVH. Systolic   function was normal. The estimated ejection fraction was 50%.   Wall motion was normal; there were no regional wall motion   abnormalities. Features are consistent with a pseudonormal left   ventricular filling pattern, with concomitant abnormal relaxation   and increased filling pressure (grade 2 diastolic dysfunction).   Doppler parameters are consistent with high ventricular filling   pressure. - Aortic valve: Transvalvular velocity was within the normal range.   There was no stenosis. There was no regurgitation. Valve area   (VTI): 2.85 cm^2. Valve area (Vmax): 2.47 cm^2. Valve area   (Vmean): 2.53 cm^2. - Mitral valve: Mildly calcified annulus. Transvalvular velocity   was within the normal range. There was no evidence for stenosis.   There was mild regurgitation. - Left atrium: The atrium was severely dilated. - Right ventricle: The cavity size was normal. Wall thickness was   normal. Systolic function was normal. - Right atrium: The atrium was severely dilated. - Atrial septum: No defect or patent foramen ovale was identified   by color flow Doppler. - Tricuspid valve: There was no regurgitation.   Neuro/Psych negative neurological ROS  negative psych ROS   GI/Hepatic negative GI ROS, Neg liver ROS,   Endo/Other  diabetesMorbid obesity  Renal/GU Renal InsufficiencyRenal disease  negative genitourinary   Musculoskeletal negative musculoskeletal ROS (+)   Abdominal   Peds negative pediatric ROS (+)  Hematology negative hematology ROS (+)   Anesthesia Other Findings   Reproductive/Obstetrics negative OB ROS                           Anesthesia Physical Anesthesia Plan  ASA: III  Anesthesia Plan: General   Post-op Pain Management:    Induction: Intravenous  PONV Risk Score and Plan: 2 and Ondansetron and Treatment may vary due to age or medical condition  Airway Management Planned: Oral ETT and Video Laryngoscope Planned  Additional Equipment:   Intra-op Plan:   Post-operative Plan: Extubation in OR  Informed Consent: I have reviewed the patients History and Physical, chart, labs and discussed the procedure including the risks, benefits and alternatives for the proposed anesthesia with the patient or authorized representative who has indicated his/her understanding and acceptance.   Dental advisory given  Plan Discussed with: CRNA and Surgeon  Anesthesia Plan Comments:        Anesthesia Quick Evaluation

## 2017-06-27 NOTE — Brief Op Note (Signed)
06/27/2017  6:02 PM  PATIENT:  Gregory Guerrero  72 y.o. male  PRE-OPERATIVE DIAGNOSIS:  cervical spondylosis  POST-OPERATIVE DIAGNOSIS:  cervical spondylosis  PROCEDURE:  Procedure(s) with comments: ANTERIOR CERVICAL DECOMPRESSION/DISCECTOMY FUSION C3-4 (N/A) - 3 hrs  SURGEON:  Surgeon(s) and Role:    [name], MD - Primary  PHYSICIAN ASSISTANT:   ASSISTANTS: Carmen Mayo    ANESTHESIA:   general  EBL:  75 mL   BLOOD ADMINISTERED:none  DRAINS: (1) TLS Drain(s) to suction in the neck   LOCAL MEDICATIONS USED:  MARCAINE     SPECIMEN:  No Specimen  DISPOSITION OF SPECIMEN:  N/A  COUNTS:  YES  TOURNIQUET:  * No tourniquets in log *  DICTATION: .Dragon Dictation  PLAN OF CARE: Admit for overnight observation  PATIENT DISPOSITION:  PACU - hemodynamically stable.

## 2017-06-27 NOTE — Op Note (Signed)
Operative report  Preoperative diagnosis cervical spondylitic myelopathy C3-4  Postoperative diagnosis same  Operative procedure anterior cervical discectomy and fusion C3-4  First Assistant: Ronette Deter, PA  Intraoperative neuro monitoring: At conclusion of case there is improvement in the SSEPs and evoked motor potentials.  Implants: Titan Nano lock 0 profile cervical cage: 7 medium lordotic.  Fixed with 14 mm 3.8 screws.  Allograft: vivogen  Indications this is a very pleasant 72 year old gentleman who presented to my office with a faculty maintaining his balance, altered gait pattern, and numbness and dysesthesias in his hands.  Imaging studies confirmed multilevel degenerative cervical disease most noted central stenosis with cord signal change at C3-4.  After discussing treatment options we elected to treat the cervical spondylitic myelopathy at C3-4.  All appropriate risks benefits and alternatives were discussed with the patient and consent was obtained.  Operative report: Patient was brought the operating room placed upon the operating room table.  After successful induction of general anesthesia and endotracheally patient teds SCDs were applied.  Foley was attempted but could not be passed so we elected not to use it.  The anterior cervical spine was prepped and draped in a standard fashion and the neuro monitoring leads were applied by the representative.  A timeout was taken to confirm patient procedure and all other important data.  Baseline neuro monitoring indicated very poor motor signals in the lower extremity and near absent sensory potentials in the lower extremity.  X-ray was used to identify the C3-4 disc space level and this was marked out with an ink pen and infiltrated with quarter percent Marcaine with epinephrine.  Transverse incision was made starting at the midline and proceeding to the left.  Sharp dissection was carried out down to the platysma.  Platysma was  isolated and and incised.  I then bluntly dissected along the sternocleidomastoid until I could palpate the anterior aspect of the cervical spine.  Self-retaining retractor was placed in the omohyoid esophagus and trachea were swept to the right I then dissected with Kitner dissectors until I could visualize and protect the carotid sheath.  I now had exposed the anterior surface of the cervical spine.  A needle was placed into the 3 4 disc space and an x-ray was taken to confirm that I was at the appropriate level.  Once confirmed I then mobilized the longus coli muscle from the mid body of C3 to the mid body of C4 bilaterally Gaspar retracting blades were placed underneath the longus coli muscle the endotracheal cuff was deflated and I expanded the retractor to the appropriate with and reinflated the cuff.  An annulotomy was performed with a 15 blade scalpel and I began resecting the disc material with pituitary rongeurs.  Using a 2 mm Kerrison rongeur I took down the overhanging osteophyte from the C3 vertebral body so I better exposure of the disc space.  Distraction pins were placed into the bodies of C3 and 4 and then I distracted the intervertebral space and maintain that with the distraction pin set.  Using neuro curettes I continued removing the disc material working posteriorly.  Once I was nearing the posterior rim of the vertebral body I use my 1 mm Kerrison to take down the osteophyte from the posterior aspect of the C3 and C4 bodies.  This allowed me to get under the uncovertebral joint and resect the osteophyte that it formed here.  Using a fine nerve hook I gently dissected through the posterior annulus and posterior  longitudinal ligament until I could develop a plane underneath the PLL.  Using my 1 mm Kerrison I then resected the posterior longitudinal ligament to adequately decompress the thecal sac.  At this point I then passed my nerve hook underneath the entire posterior aspect of the vertebral  body at the level of the disc space and under the uncovertebral joints.  At this point I was very satisfied with my decompression.  At this point I used the medium trials to determine the best size.  The 7 mm medium lordotic cage provided the best intervertebral fit.  I obtain the actual 0 profile implant and packed it with the allograft.  I irrigated the wound copiously normal saline and then malleted to the cage to the appropriate depth.  Once I was satisfied with its position I then used the awl to broach the cortices and then placed the 3.8 diameter locking screws one into the body of C4 and one into the body of C3.  Both screws had excellent purchase.  I then copiously irrigated the wound with normal saline removed the distraction pins and maintain hemostasis using bone wax in the distraction pinholes.  All the retractors were now removed and final x-rays were taken.  I was very satisfied with the decompression as well as a final x-rays.  The wound was copiously irrigated with normal saline using bipolar cautery and FloSeal I obtained hemostasis.  I did place a drain and it was brought out through a separate stab incision.  I then return the tracheoesophagus to midline closed platysma with interrupted 2-0 Vicryl suture.  I then closed the skin with 3-0 Monocryl.  A 2-0 silk was used to hold the drain in place.  Dry dressings were applied as was the aspirin collar.  Patient was ultimately extubated transferred the PACU without incident.  After insertion of the cage repeat motor and sensory evoked potentials demonstrated improvement from baseline.

## 2017-06-27 NOTE — Anesthesia Procedure Notes (Signed)
Procedure Name: Intubation Date/Time: 06/27/2017 3:30 PM Performed by: Lexie Morini T, CRNA Pre-anesthesia Checklist: Patient identified, Emergency Drugs available, Suction available and Patient being monitored Patient Re-evaluated:Patient Re-evaluated prior to induction Oxygen Delivery Method: Circle system utilized Preoxygenation: Pre-oxygenation with 100% oxygen Induction Type: IV induction Ventilation: Mask ventilation without difficulty and Oral airway inserted - appropriate to patient size Laryngoscope Size: Glidescope and 4 Grade View: Grade I Tube type: Oral Tube size: 7.5 mm Number of attempts: 1 Airway Equipment and Method: Patient positioned with wedge pillow,  Stylet and Video-laryngoscopy Placement Confirmation: ETT inserted through vocal cords under direct vision,  positive ETCO2 and breath sounds checked- equal and bilateral Secured at: 23 cm Tube secured with: Tape Dental Injury: Teeth and Oropharynx as per pre-operative assessment  Difficulty Due To: Difficult Airway- due to reduced neck mobility and Difficulty was anticipated Future Recommendations: Recommend- induction with short-acting agent, and alternative techniques readily available

## 2017-06-27 NOTE — Transfer of Care (Signed)
Immediate Anesthesia Transfer of Care Note  Patient: Gregory Guerrero  Procedure(s) Performed: ANTERIOR CERVICAL DECOMPRESSION/DISCECTOMY FUSION C3-4 (N/A )  Patient Location: PACU  Anesthesia Type:General  Level of Consciousness: awake and sedated  Airway & Oxygen Therapy: Patient Spontanous Breathing and Patient connected to nasal cannula oxygen  Post-op Assessment: Report given to RN, Post -op Vital signs reviewed and stable and Patient moving all extremities  Post vital signs: Reviewed and stable  Last Vitals:  Vitals:   06/27/17 1227  BP: (!) 157/86  Pulse: (!) 56  Resp: 18  Temp: 36.7 C  SpO2: 97%    Last Pain:  Vitals:   06/27/17 1253  TempSrc:   PainSc: 0-No pain      Patients Stated Pain Goal: 3 (09/32/67 1245)  Complications: No apparent anesthesia complications

## 2017-06-27 NOTE — Anesthesia Postprocedure Evaluation (Signed)
Anesthesia Post Note  Patient: Gregory Guerrero  Procedure(s) Performed: ANTERIOR CERVICAL DECOMPRESSION/DISCECTOMY FUSION C3-4 (N/A )     Patient location during evaluation: PACU Anesthesia Type: General Level of consciousness: sedated and patient cooperative Pain management: pain level controlled Vital Signs Assessment: post-procedure vital signs reviewed and stable Respiratory status: spontaneous breathing Cardiovascular status: stable Anesthetic complications: no    Last Vitals:  Vitals:   06/27/17 1830 06/27/17 1845  BP: (!) 157/87 (!) 157/82  Pulse: 60 (!) 58  Resp: 15 10  Temp:    SpO2: 91% 98%    Last Pain:  Vitals:   06/27/17 1845  TempSrc:   PainSc: 0-No pain                 Nolon Nations

## 2017-06-27 NOTE — Discharge Instructions (Signed)
Cervical Fusion, Care After °This sheet gives you information about how to care for yourself after your procedure. Your health care provider may also give you more specific instructions. If you have problems or questions, contact your health care provider. °What can I expect after the procedure? °After the procedure, it is common to have: °· Incision area pain. °· Numbness. °· Weakness. °· Sore throat. °· Difficulty swallowing. ° °Follow these instructions at home: °Medicines °· Take over-the-counter and prescription medicines, including pain medicines, only as told by your health care provider. °· If you were prescribed an antibiotic medicine, take it as told by your health care provider. Do not stop taking the antibiotic even if you start to feel better. °If you have a brace: °· Wear the brace as told by your health care provider. Remove it only as told by your health care provider. °· Keep the brace clean. °Incision care °· Follow instructions from your health care provider about how to take care of your incision. Make sure you: °? Wash your hands with soap and water before you change your bandage (dressing). If soap and water are not available, use hand sanitizer. °? Change your dressing as told by your health care provider. °? Leave stitches (sutures), skin glue, or adhesive strips in place. These skin closures may need to be in place for 2 weeks or longer. If adhesive strip edges start to loosen and curl up, you may trim the loose edges. Do not remove adhesive strips completely unless your health care provider tells you to do that. °· Keep your incision clean and dry. Do not take baths, swim, or use a hot tub until your health care provider approves. °· Check your incision area every day for signs of infection. Check for: °? More redness, swelling, or pain. °? More fluid or blood. °? Warmth. °? Pus or a bad smell. °Activity ° °· Rest and protect your back as much as possible. °· Do not lift anything that is  heavier than 10 lb (4.5 kg) or the limit that you are told by your health care provider. °· Do not twist or bend at the waist until your health care provider approves. °· Avoid: °? Pushing and pulling motions. °? Lifting anything over your head. °? Sitting or lying down in the same position for long periods of time. °· Do not exercise until your health care provider approves. Once your health care provider has approved exercise, ask him or her what kinds of exercises you can do to make your back stronger (physical therapy). °· Do not drive until your health care provider approves. °? Do not drive for 24 hours if you received a medicine to help you relax (sedative). °? Do not drive or use heavy machinery while taking prescription pain medicine. °General instructions ° °· Have someone assist you to turn in bed frequently by moving your whole body without twisting your back (log rolling technique). °· Wear compression stockings as told by your health care provider. These stockings help to prevent blood clots and reduce swelling in your legs. °· Do not use any products that contain nicotine or tobacco, such as cigarettes and e-cigarettes. These can delay bone healing. If you need help quitting, ask your health care provider. °· To prevent or treat constipation while you are taking prescription pain medicine, your health care provider may recommend that you: °? Drink enough fluid to keep your urine clear or pale yellow. °? Take over-the-counter or prescription medicines. °? Eat foods   that are high in fiber, such as fresh fruits and vegetables, whole grains, and beans. °? Limit foods that are high in fat and processed sugars, such as fried and sweet foods. °· Keep all follow-up visits as told by your health care provider. This is important. °Contact a health care provider if: °· You have pain that gets worse or does not get better with medicine. °· You have more redness, swelling, or pain around your incision. °· You have  more fluid or blood coming from your incision. °· Your incision feels warm to the touch. °· You have pus or a bad smell coming from your incision. °· You have a fever. °· You have weakness or numbness in your legs that is new or getting worse. °· You have swelling in your calf or leg. °· The edges of your incision break open. °· You vomit or feel nauseous. °· You have trouble controlling urination or bowel movements. °Get help right away if: °· You develop shortness of breath or chest pain. °· You have trouble swallowing. °· You have trouble breathing. °· You develop a cough. °This information is not intended to replace advice given to you by your health care provider. Make sure you discuss any questions you have with your health care provider. °Document Released: 11/16/2003 Document Revised: 10/27/2015 Document Reviewed: 10/13/2015 °Elsevier Interactive Patient Education © 2018 [employer] ° ° ° ° ° ° °

## 2017-06-28 ENCOUNTER — Encounter (HOSPITAL_COMMUNITY): Payer: Self-pay | Admitting: Orthopedic Surgery

## 2017-06-28 DIAGNOSIS — I129 Hypertensive chronic kidney disease with stage 1 through stage 4 chronic kidney disease, or unspecified chronic kidney disease: Secondary | ICD-10-CM | POA: Diagnosis not present

## 2017-06-28 DIAGNOSIS — I2510 Atherosclerotic heart disease of native coronary artery without angina pectoris: Secondary | ICD-10-CM | POA: Diagnosis not present

## 2017-06-28 DIAGNOSIS — Z981 Arthrodesis status: Secondary | ICD-10-CM | POA: Diagnosis not present

## 2017-06-28 DIAGNOSIS — M4712 Other spondylosis with myelopathy, cervical region: Secondary | ICD-10-CM | POA: Diagnosis not present

## 2017-06-28 DIAGNOSIS — I252 Old myocardial infarction: Secondary | ICD-10-CM | POA: Diagnosis not present

## 2017-06-28 DIAGNOSIS — N189 Chronic kidney disease, unspecified: Secondary | ICD-10-CM | POA: Diagnosis not present

## 2017-06-28 NOTE — Progress Notes (Signed)
Pt doing well. Pt and wife given D/C instructions with Rx's, verbal understanding was provided. Pt's incision is clean and dry with no sign of infection. Pt's IV was removed prior to D/C. Pt D/C'd home via wheelchair @ 1455 per MD order. Pt is stable @ D/C and has no other needs at this time. Holli Humbles, RN

## 2017-06-28 NOTE — Evaluation (Addendum)
Physical Therapy Evaluation Patient Details Name: Gregory Guerrero MRN: [id number] DOB: 09-26-1945 Today's Date: 06/28/2017   History of Present Illness  s/p ANTERIOR CERVICAL DECOMPRESSION/DISCECTOMY FUSION C3-4.  H/o CAD, R TKA, MI, lumbar fusion, Numbness and tingling in both hands  Clinical Impression  Patient presents with decreased independence with mobility due to decreased endurance and postural strength s/p above procedure.  He reports some imbalance prior to surgery and feels actually improved since surgery.  Discussed fall risk and pt agrees to use cane, but feels no current needs for follow up PT.  Will have assist from wife at d/c.    Follow Up Recommendations No PT follow up    Equipment Recommendations  None recommended by PT    Recommendations for Other Services       Precautions / Restrictions Precautions Precautions: Cervical Cervical Brace: Hard collar Restrictions Weight Bearing Restrictions: No      Mobility  Bed Mobility   Bed Mobility: Rolling;Sit to Supine;Sidelying to Sit   Sidelying to sit: Supervision Supine to sit: Supervision Sit to supine: Supervision   General bed mobility comments: sit to supine without going to side first despite cues and handout, but did roll with cues to reach for rail and sat up from sidelying  Transfers Overall transfer level: Needs assistance Equipment used: None Transfers: Sit to/from Stand Sit to Stand: Supervision            Ambulation/Gait Ambulation/Gait assistance: Min guard Ambulation Distance (Feet): 200 Feet Assistive device: None Gait Pattern/deviations: Step-through pattern;Decreased stride length;Antalgic;Trunk flexed;Drifts right/left     General Gait Details: intially with only slightly flexed posture, but increased with increased distance and started holding wall rail, minguard for safety due to anterior bias.  Stairs            Wheelchair Mobility    Modified Rankin (Stroke  Patients Only)       Balance Overall balance assessment: Needs assistance Sitting-balance support: Feet supported Sitting balance-Leahy Scale: Good       Standing balance-Leahy Scale: Good Standing balance comment: able to initiate ambulation without device or UE support, degrades with fatigue needing UE support for safety                             Pertinent Vitals/Pain Faces Pain Scale: No hurt    Home Living Family/patient expects to be discharged to:: Private residence Living Arrangements: Spouse/significant other Available Help at Discharge: Family Type of Home: House Home Access: Level entry     Home Layout: One level Home Equipment: Bedside commode;Walker - 2 wheels      Prior Function Level of Independence: Independent         Comments: recent knee replacement in January but reports had graduated from cane, has been unsteady at baseline     Hand Dominance   Dominant Hand: Right    Extremity/Trunk Assessment   Upper Extremity Assessment Upper Extremity Assessment: Defer to OT evaluation    Lower Extremity Assessment Lower Extremity Assessment: Overall WFL for tasks assessed    Cervical / Trunk Assessment Cervical / Trunk Assessment: Lordotic;Other exceptions Cervical / Trunk Exceptions: cervical surgery  Communication   Communication: HOH  Cognition Arousal/Alertness: Awake/alert Behavior During Therapy: WFL for tasks assessed/performed Overall Cognitive Status: Within Functional Limits for tasks assessed  General Comments General comments (skin integrity, edema, etc.): educated in precautions and brace application/care with wife present.  Discussed getting in and out of car and risks and benefits of not using walker, but pt agrees to use cane at home.     Exercises     Assessment/Plan    PT Assessment Patent does not need any further PT services  PT Problem List          PT Treatment Interventions      PT Goals (Current goals can be found in the Care Plan section)  Acute Rehab PT Goals PT Goal Formulation: All assessment and education complete, DC therapy    Frequency     Barriers to discharge        Co-evaluation               AM-PAC PT "6 Clicks" Daily Activity  Outcome Measure Difficulty turning over in bed (including adjusting bedclothes, sheets and blankets)?: A Lot Difficulty moving from lying on back to sitting on the side of the bed? : A Little Difficulty sitting down on and standing up from a chair with arms (e.g., wheelchair, bedside commode, etc,.)?: A Little Help needed moving to and from a bed to chair (including a wheelchair)?: None Help needed walking in hospital room?: A Little Help needed climbing 3-5 steps with a railing? : A Little 6 Click Score: 18    End of Session Equipment Utilized During Treatment: Gait belt;Cervical collar Activity Tolerance: Patient tolerated treatment well Patient left: in bed;with call bell/phone within reach;with family/visitor present   PT Visit Diagnosis: Other abnormalities of gait and mobility (R26.89)    Time: 8325-4982 PT Time Calculation (min) (ACUTE ONLY): 23 min   Charges:   PT Evaluation $PT Eval Low Complexity: 1 Low PT Treatments $Self Care/Home Management: 8-22   PT G CodesMagda Guerrero, Gregory Guerrero 06/28/2017   Gregory Guerrero 06/28/2017, 1:26 PM

## 2017-06-28 NOTE — Progress Notes (Signed)
    Subjective: Procedure(s) (LRB): ANTERIOR CERVICAL DECOMPRESSION/DISCECTOMY FUSION C3-4 (N/A) 1 Day Post-Op  Patient reports pain as 2 on 0-10 scale.  Reports decreased arm pain reports incisional neck pain   Positive void Negative bowel movement Positive flatus Negative chest pain or shortness of breath  Objective: Vital signs in last 24 hours: Temp:  [97.6 F (36.4 C)-98.8 F (37.1 C)] 98.5 F (36.9 C) (03/14 0400) Pulse Rate:  [56-61] 57 (03/14 0400) Resp:  [10-20] 18 (03/14 0400) BP: (142-161)/(79-88) 160/83 (03/14 0400) SpO2:  [91 %-98 %] 94 % (03/14 0400)  Intake/Output from previous day: 03/13 0701 - 03/14 0700 In: 1301 [P.O.:1; I.V.:1300] Out: 115 [Drains:40; Blood:75]  Labs: No results for input(s): WBC, RBC, HCT, PLT in the last 72 hours. No results for input(s): NA, K, CL, CO2, BUN, CREATININE, GLUCOSE, CALCIUM in the last 72 hours. No results for input(s): LABPT, INR in the last 72 hours.  Physical Exam: ABD soft Intact pulses distally Incision: dressing C/D/I Compartment soft neuro: improved stability when ambulating.  Numbess in hands persists There is no height or weight on file to calculate BMI.  Assessment/Plan: Patient stable  xrays n/a Mobilization with physical therapy Encourage incentive spirometry Continue care  Advance diet Up with therapy  Pleased with improved ambulation. Will monitor neuro recovery Plan on d/c to home later today if cleared by PT 30 cc output - will d/c drain this AM  [name], MD [place] [phone]

## 2017-06-28 NOTE — Evaluation (Signed)
Occupational Therapy Evaluation Patient Details Name: Gregory Guerrero MRN: [id number] DOB: [birth year] Today's Date: 06/28/2017    History of Present Illness s/p ANTERIOR CERVICAL DECOMPRESSION/DISCECTOMY FUSION C3-4.  H/o CAD, R TKA, MI, lumbar fusion, Numbness and tingling in both hands   Clinical Impression   Pt admitted with the above diagnoses and presents with below problem list. PTA pt was independent to mod I with ADLs. Pt currently min guard with functional mobility, min guard to min A with functional transfers, min A for UB/LB ADLs. Pt reporting difficulty bringing loaded utensil to mouth to self feed, difficulty with tasks requiring Juda. All education completed. Pt is for d/c home today.     Follow Up Recommendations  Supervision - Intermittent;Other (comment)(OOB/mobility)    Equipment Recommendations  None recommended by OT    Recommendations for Other Services PT consult     Precautions / Restrictions Precautions Precautions: Cervical Precaution Booklet Issued: Yes (comment) Required Braces or Orthoses: Cervical Brace Cervical Brace: Hard collar      Mobility Bed Mobility Overal bed mobility: Needs Assistance Bed Mobility: Supine to Sit     Supine to sit: HOB elevated;Supervision;Min guard     General bed mobility comments: instructed and cued to log roll, ultimately completed as supine>sit. has adjustable bed at home  Transfers Overall transfer level: Needs assistance Equipment used: Rolling walker (2 wheeled);None Transfers: Sit to/from Stand Sit to Stand: Min guard;Min assist         General transfer comment: min A on second sit<>stand from EOB for stabilizing rw with pt pulling up on rw with both hands.     Balance Overall balance assessment: Needs assistance Sitting-balance support: Feet supported       Standing balance support: Bilateral upper extremity supported;No upper extremity supported Standing balance-Leahy Scale: Fair Standing  balance comment: fair static stand, seeks external support during dynamic tasks                           ADL either performed or assessed with clinical judgement   ADL Overall ADL's : Needs assistance/impaired Eating/Feeding: Sitting;Moderate assistance Eating/Feeding Details (indicate cue type and reason): pt reports difficulty bringing utensil to mouth Grooming: Moderate assistance;Standing Grooming Details (indicate cue type and reason): supervision for balance, assist for tasks due to UB deficits Upper Body Bathing: Minimal assistance;Sitting;Set up   Lower Body Bathing: Supervison/ safety;Minimal assistance;Sit to/from stand   Upper Body Dressing : Minimal assistance;Sitting   Lower Body Dressing: Minimal assistance;Sit to/from stand   Toilet Transfer: Ambulation;Min guard;RW   Toileting- Water quality scientist and Hygiene: Min guard   Tub/ Shower Transfer: Walk-in shower;Min guard   Functional mobility during ADLs: Min guard;Rolling walker General ADL Comments: Pt completed bed mobility, in-room functional mobility, and stood at sink for brace education.      Vision         Perception     Praxis      Pertinent Vitals/Pain Pain Assessment: Faces Faces Pain Scale: Hurts a little bit Pain Location: nack Pain Descriptors / Indicators: Sore Pain Intervention(s): Monitored during session     Hand Dominance Right   Extremity/Trunk Assessment Upper Extremity Assessment Upper Extremity Assessment: Generalized weakness;RUE deficits/detail;LUE deficits/detail RUE Deficits / Details: Numbness and tingling in both hands, reports difficulty bringing feeding utensil to mouth LUE Deficits / Details: Numbness and tingling in both hands   Lower Extremity Assessment Lower Extremity Assessment: Defer to PT evaluation       Communication  Communication Communication: HOH   Cognition Arousal/Alertness: Awake/alert Behavior During Therapy: WFL for tasks  assessed/performed Overall Cognitive Status: Within Functional Limits for tasks assessed                                     General Comments       Exercises Exercises: Other exercises Other Exercises Other Exercises: Instructed in gentle AROM of UE to prevent adhesive capsilitis.    Shoulder Instructions      Home Living Family/patient expects to be discharged to:: Private residence Living Arrangements: Spouse/significant other Available Help at Discharge: Family Type of Home: House Home Access: Level entry     Home Layout: One level     Bathroom Shower/Tub: Occupational psychologist: Handicapped height     Home Equipment: Bedside commode;Walker - 2 wheels          Prior Functioning/Environment Level of Independence: Independent                 OT Problem List: Impaired balance (sitting and/or standing);Decreased knowledge of use of DME or AE;Decreased knowledge of precautions;Pain;Impaired UE functional use      OT Treatment/Interventions:      OT Goals(Current goals can be found in the care plan section) Acute Rehab OT Goals Patient Stated Goal: home, regain independence  OT Frequency:     Barriers to D/C:            Co-evaluation              AM-PAC PT "6 Clicks" Daily Activity     Outcome Measure Help from another person eating meals?: A Little Help from another person taking care of personal grooming?: A Little Help from another person toileting, which includes using toliet, bedpan, or urinal?: A Little Help from another person bathing (including washing, rinsing, drying)?: A Little Help from another person to put on and taking off regular upper body clothing?: A Little Help from another person to put on and taking off regular lower body clothing?: A Little 6 Click Score: 18   End of Session Equipment Utilized During Treatment: Cervical collar  Activity Tolerance: Patient tolerated treatment well Patient left:  in chair;with call bell/phone within reach  OT Visit Diagnosis: Unsteadiness on feet (R26.81);Pain                Time: 4142-3953 OT Time Calculation (min): 33 min Charges:  OT General Charges $OT Visit: 1 Visit OT Evaluation $OT Eval Low Complexity: 1 Low OT Treatments $Self Care/Home Management : 8-22 mins G-Codes:       Hortencia Pilar 06/28/2017, 9:43 AM

## 2017-07-02 NOTE — Discharge Summary (Signed)
Physician Discharge Summary  Patient ID: Gregory Guerrero MRN: [id number] DOB/AGE: 10-31-1945 72 y.o.  Admit date: 06/27/2017 Discharge date: 06/28/17  Admission Diagnoses:  Cervical spondylitic myelopathy  Discharge Diagnoses:  Active Problems:   S/P cervical spinal fusion   Past Medical History:  Diagnosis Date  . Arthritis   . CAD (coronary artery disease)   . Chronic kidney disease   . DVT (deep venous thrombosis) ([place])    following spinal surgery Nov 2017  . HTN (hypertension)   . MI (myocardial infarction) ([place])    1999 ; stents placed   . Numbness and tingling in both hands   . Spondylosis of cervical spine     Surgeries: Procedure(s): ANTERIOR CERVICAL DECOMPRESSION/DISCECTOMY FUSION C3-4 on 06/27/2017   Consultants (if any):   Discharged Condition: Improved  Hospital Course: Gregory Guerrero is an 72 y.o. male who was admitted 06/27/2017 with a diagnosis of Cervical spondylitic Myelopathy and went to the operating room on 06/27/2017 and underwent the above named procedures.  Post op day 1 pt reports mild pain controlled on oral medication.  Pt is ambulating and urinating.  Pt is cleared by PT for DC.   He was given perioperative antibiotics:  Anti-infectives (From admission, onward)   Start     Dose/Rate Route Frequency Ordered Stop   06/27/17 2300  ceFAZolin (ANCEF) IVPB 2g/100 mL premix     2 g 200 mL/hr over 30 Minutes Intravenous Every 8 hours 06/27/17 1948 06/28/17 0700   06/27/17 1237  ceFAZolin (ANCEF) IVPB 2g/100 mL premix     2 g 200 mL/hr over 30 Minutes Intravenous 30 min pre-op 06/27/17 1237 06/27/17 1538    .  He was given sequential compression devices, early ambulation, and TED for DVT prophylaxis.  He benefited maximally from the hospital stay and there were no complications.    Recent vital signs:  Vitals:   06/28/17 0400 06/28/17 0751  BP: (!) 160/83 (!) 166/91  Pulse: (!) 57 61  Resp: 18 16  Temp: 98.5 F (36.9 C) 98.4 F (36.9 C)   SpO2: 94% 95%    Recent laboratory studies:  Lab Results  Component Value Date   HGB 12.5 (L) 06/21/2017   HGB 11.1 (L) 05/02/2017   HGB 12.2 (L) 05/01/2017   Lab Results  Component Value Date   WBC 5.9 06/21/2017   PLT 179 06/21/2017   Lab Results  Component Value Date   INR 0.97 04/27/2017   Lab Results  Component Value Date   NA 141 06/21/2017   K 4.8 06/21/2017   CL 110 06/21/2017   CO2 22 06/21/2017   BUN 25 (H) 06/21/2017   CREATININE 1.44 (H) 06/21/2017   GLUCOSE 85 06/21/2017    Discharge Medications:   Allergies as of 06/28/2017   No Known Allergies     Medication List    STOP taking these medications   acetaminophen 500 MG tablet Commonly known as:  TYLENOL   aspirin EC 81 MG tablet   oxyCODONE 5 MG immediate release tablet Commonly known as:  Oxy IR/ROXICODONE     TAKE these medications   atorvastatin 40 MG tablet Commonly known as:  LIPITOR Take 40 mg by mouth daily.   furosemide 20 MG tablet Commonly known as:  LASIX Take 20 mg by mouth 3 (three) times a week.   gabapentin 300 MG capsule Commonly known as:  NEURONTIN Take 300-600 mg by mouth 3 (three) times daily. 300-600 mg po tid prn for neuralgia  and peripheral neuropathy   losartan 100 MG tablet Commonly known as:  COZAAR Take 100 mg by mouth daily.   methocarbamol 500 MG tablet Commonly known as:  ROBAXIN Take 1 tablet (500 mg total) by mouth 3 (three) times daily.   metoprolol tartrate 25 MG tablet Commonly known as:  LOPRESSOR Take 1 tablet (25 mg total) 2 (two) times daily by mouth.   ondansetron 4 MG disintegrating tablet Commonly known as:  ZOFRAN ODT Take 1 tablet (4 mg total) by mouth every 8 (eight) hours as needed for nausea or vomiting.   oxyCODONE-acetaminophen 10-325 MG tablet Commonly known as:  PERCOCET Take 1 tablet by mouth every 4 (four) hours as needed for pain.       Diagnostic Studies: Dg Cervical Spine 2-3 Views  Result Date:  06/27/2017 CLINICAL DATA:  ACDF of C3-4 EXAM: DG C-ARM 61-120 MIN; CERVICAL SPINE - 2-3 VIEW COMPARISON:  None. FINDINGS: 23 seconds of fluoroscopic time was utilized. Intraoperative AP and lateral views of the upper cervical spine are provided. Anterior cervical discectomy with fusion at C3-4 is noted without immediate postoperative complications. Fusion hardware at the C3-4 interspace is noted. IMPRESSION: C3-4 anterior cervical discectomy with fusion. Electronically Signed   By: Ashley Royalty M.D.   On: 06/27/2017 20:29   Dg C-arm 1-60 Min  Result Date: 06/27/2017 CLINICAL DATA:  ACDF of C3-4 EXAM: DG C-ARM 61-120 MIN; CERVICAL SPINE - 2-3 VIEW COMPARISON:  None. FINDINGS: 23 seconds of fluoroscopic time was utilized. Intraoperative AP and lateral views of the upper cervical spine are provided. Anterior cervical discectomy with fusion at C3-4 is noted without immediate postoperative complications. Fusion hardware at the C3-4 interspace is noted. IMPRESSION: C3-4 anterior cervical discectomy with fusion. Electronically Signed   By: Ashley Royalty M.D.   On: 06/27/2017 20:29   Dg C-arm 1-60 Min  Result Date: 06/27/2017 CLINICAL DATA:  ACDF of C3-4 EXAM: DG C-ARM 61-120 MIN; CERVICAL SPINE - 2-3 VIEW COMPARISON:  None. FINDINGS: 23 seconds of fluoroscopic time was utilized. Intraoperative AP and lateral views of the upper cervical spine are provided. Anterior cervical discectomy with fusion at C3-4 is noted without immediate postoperative complications. Fusion hardware at the C3-4 interspace is noted. IMPRESSION: C3-4 anterior cervical discectomy with fusion. Electronically Signed   By: Ashley Royalty M.D.   On: 06/27/2017 20:29    Disposition:  Pt will present to clinic in 2 weeks Post op medication provided  Discharge Instructions    Incentive spirometry RT   Complete by:  As directed       Follow-up Information    [name], MD Follow up in 2 week(s).   Specialty:  Orthopedic Surgery Contact  information: [id number] [place] [address] [phone]            Signed: Valinda Hoar 07/02/2017, 10:27 AM

## 2017-07-03 ENCOUNTER — Encounter (HOSPITAL_COMMUNITY): Payer: Self-pay | Admitting: Orthopedic Surgery

## 2017-07-10 ENCOUNTER — Encounter (HOSPITAL_COMMUNITY): Payer: Self-pay | Admitting: Orthopedic Surgery

## 2017-08-10 DIAGNOSIS — M48061 Spinal stenosis, lumbar region without neurogenic claudication: Secondary | ICD-10-CM | POA: Diagnosis not present

## 2017-08-10 DIAGNOSIS — Z4789 Encounter for other orthopedic aftercare: Secondary | ICD-10-CM | POA: Diagnosis not present

## 2017-08-15 DIAGNOSIS — R29818 Other symptoms and signs involving the nervous system: Secondary | ICD-10-CM | POA: Diagnosis not present

## 2017-08-15 DIAGNOSIS — G8929 Other chronic pain: Secondary | ICD-10-CM | POA: Diagnosis not present

## 2017-08-15 DIAGNOSIS — Z981 Arthrodesis status: Secondary | ICD-10-CM | POA: Diagnosis not present

## 2017-08-15 DIAGNOSIS — Z9181 History of falling: Secondary | ICD-10-CM | POA: Diagnosis not present

## 2017-08-15 DIAGNOSIS — R531 Weakness: Secondary | ICD-10-CM | POA: Diagnosis not present

## 2017-08-15 DIAGNOSIS — R202 Paresthesia of skin: Secondary | ICD-10-CM | POA: Diagnosis not present

## 2017-08-15 DIAGNOSIS — M6281 Muscle weakness (generalized): Secondary | ICD-10-CM | POA: Diagnosis not present

## 2017-08-15 DIAGNOSIS — M48061 Spinal stenosis, lumbar region without neurogenic claudication: Secondary | ICD-10-CM | POA: Diagnosis not present

## 2017-08-15 DIAGNOSIS — M545 Low back pain: Secondary | ICD-10-CM | POA: Diagnosis not present

## 2017-08-16 ENCOUNTER — Other Ambulatory Visit: Payer: Self-pay | Admitting: Orthopedic Surgery

## 2017-08-16 DIAGNOSIS — M48061 Spinal stenosis, lumbar region without neurogenic claudication: Secondary | ICD-10-CM

## 2017-08-22 DIAGNOSIS — M48061 Spinal stenosis, lumbar region without neurogenic claudication: Secondary | ICD-10-CM | POA: Diagnosis not present

## 2017-08-22 DIAGNOSIS — G8929 Other chronic pain: Secondary | ICD-10-CM | POA: Diagnosis not present

## 2017-08-22 DIAGNOSIS — R29818 Other symptoms and signs involving the nervous system: Secondary | ICD-10-CM | POA: Diagnosis not present

## 2017-08-22 DIAGNOSIS — M545 Low back pain: Secondary | ICD-10-CM | POA: Diagnosis not present

## 2017-08-22 DIAGNOSIS — R531 Weakness: Secondary | ICD-10-CM | POA: Diagnosis not present

## 2017-08-22 DIAGNOSIS — R202 Paresthesia of skin: Secondary | ICD-10-CM | POA: Diagnosis not present

## 2017-08-22 DIAGNOSIS — M6281 Muscle weakness (generalized): Secondary | ICD-10-CM | POA: Diagnosis not present

## 2017-08-22 DIAGNOSIS — Z9181 History of falling: Secondary | ICD-10-CM | POA: Diagnosis not present

## 2017-08-22 DIAGNOSIS — Z981 Arthrodesis status: Secondary | ICD-10-CM | POA: Diagnosis not present

## 2017-08-24 DIAGNOSIS — M48061 Spinal stenosis, lumbar region without neurogenic claudication: Secondary | ICD-10-CM | POA: Diagnosis not present

## 2017-08-24 DIAGNOSIS — R531 Weakness: Secondary | ICD-10-CM | POA: Diagnosis not present

## 2017-08-24 DIAGNOSIS — M6281 Muscle weakness (generalized): Secondary | ICD-10-CM | POA: Diagnosis not present

## 2017-08-24 DIAGNOSIS — Z9181 History of falling: Secondary | ICD-10-CM | POA: Diagnosis not present

## 2017-08-24 DIAGNOSIS — Z981 Arthrodesis status: Secondary | ICD-10-CM | POA: Diagnosis not present

## 2017-08-24 DIAGNOSIS — R29818 Other symptoms and signs involving the nervous system: Secondary | ICD-10-CM | POA: Diagnosis not present

## 2017-08-24 DIAGNOSIS — R202 Paresthesia of skin: Secondary | ICD-10-CM | POA: Diagnosis not present

## 2017-08-24 DIAGNOSIS — M545 Low back pain: Secondary | ICD-10-CM | POA: Diagnosis not present

## 2017-08-24 DIAGNOSIS — G8929 Other chronic pain: Secondary | ICD-10-CM | POA: Diagnosis not present

## 2017-08-27 ENCOUNTER — Ambulatory Visit
Admission: RE | Admit: 2017-08-27 | Discharge: 2017-08-27 | Disposition: A | Discharge status: Home / Self Care | Payer: Medicare Other | Source: Ambulatory Visit | Attending: Orthopedic Surgery | Admitting: Orthopedic Surgery

## 2017-08-27 DIAGNOSIS — M48061 Spinal stenosis, lumbar region without neurogenic claudication: Secondary | ICD-10-CM

## 2017-08-27 MED ORDER — METHYLPREDNISOLONE ACETATE 40 MG/ML INJ SUSP (RADIOLOG
120.0000 mg | Freq: Once | INTRAMUSCULAR | Status: AC
  Administered 2017-08-27: 120 mg via EPIDURAL

## 2017-08-27 MED ORDER — IOPAMIDOL (ISOVUE-M 200) INJECTION 41%
1.0000 mL | Freq: Once | INTRAMUSCULAR | Status: AC
  Administered 2017-08-27: 1 mL via EPIDURAL

## 2017-08-29 DIAGNOSIS — M545 Low back pain: Secondary | ICD-10-CM | POA: Diagnosis not present

## 2017-08-29 DIAGNOSIS — R531 Weakness: Secondary | ICD-10-CM | POA: Diagnosis not present

## 2017-08-29 DIAGNOSIS — M48061 Spinal stenosis, lumbar region without neurogenic claudication: Secondary | ICD-10-CM | POA: Diagnosis not present

## 2017-08-29 DIAGNOSIS — R29818 Other symptoms and signs involving the nervous system: Secondary | ICD-10-CM | POA: Diagnosis not present

## 2017-08-29 DIAGNOSIS — R202 Paresthesia of skin: Secondary | ICD-10-CM | POA: Diagnosis not present

## 2017-08-29 DIAGNOSIS — Z981 Arthrodesis status: Secondary | ICD-10-CM | POA: Diagnosis not present

## 2017-08-29 DIAGNOSIS — Z9181 History of falling: Secondary | ICD-10-CM | POA: Diagnosis not present

## 2017-08-29 DIAGNOSIS — M6281 Muscle weakness (generalized): Secondary | ICD-10-CM | POA: Diagnosis not present

## 2017-08-29 DIAGNOSIS — G8929 Other chronic pain: Secondary | ICD-10-CM | POA: Diagnosis not present

## 2017-08-31 DIAGNOSIS — R202 Paresthesia of skin: Secondary | ICD-10-CM | POA: Diagnosis not present

## 2017-08-31 DIAGNOSIS — G8929 Other chronic pain: Secondary | ICD-10-CM | POA: Diagnosis not present

## 2017-08-31 DIAGNOSIS — M6281 Muscle weakness (generalized): Secondary | ICD-10-CM | POA: Diagnosis not present

## 2017-08-31 DIAGNOSIS — M48061 Spinal stenosis, lumbar region without neurogenic claudication: Secondary | ICD-10-CM | POA: Diagnosis not present

## 2017-08-31 DIAGNOSIS — Z981 Arthrodesis status: Secondary | ICD-10-CM | POA: Diagnosis not present

## 2017-08-31 DIAGNOSIS — M545 Low back pain: Secondary | ICD-10-CM | POA: Diagnosis not present

## 2017-08-31 DIAGNOSIS — R531 Weakness: Secondary | ICD-10-CM | POA: Diagnosis not present

## 2017-08-31 DIAGNOSIS — R29818 Other symptoms and signs involving the nervous system: Secondary | ICD-10-CM | POA: Diagnosis not present

## 2017-08-31 DIAGNOSIS — Z9181 History of falling: Secondary | ICD-10-CM | POA: Diagnosis not present

## 2017-09-05 DIAGNOSIS — M545 Low back pain: Secondary | ICD-10-CM | POA: Diagnosis not present

## 2017-09-05 DIAGNOSIS — Z9181 History of falling: Secondary | ICD-10-CM | POA: Diagnosis not present

## 2017-09-05 DIAGNOSIS — R202 Paresthesia of skin: Secondary | ICD-10-CM | POA: Diagnosis not present

## 2017-09-05 DIAGNOSIS — R531 Weakness: Secondary | ICD-10-CM | POA: Diagnosis not present

## 2017-09-05 DIAGNOSIS — G8929 Other chronic pain: Secondary | ICD-10-CM | POA: Diagnosis not present

## 2017-09-05 DIAGNOSIS — Z981 Arthrodesis status: Secondary | ICD-10-CM | POA: Diagnosis not present

## 2017-09-05 DIAGNOSIS — R29818 Other symptoms and signs involving the nervous system: Secondary | ICD-10-CM | POA: Diagnosis not present

## 2017-09-05 DIAGNOSIS — M48061 Spinal stenosis, lumbar region without neurogenic claudication: Secondary | ICD-10-CM | POA: Diagnosis not present

## 2017-09-05 DIAGNOSIS — M6281 Muscle weakness (generalized): Secondary | ICD-10-CM | POA: Diagnosis not present

## 2017-09-07 DIAGNOSIS — R531 Weakness: Secondary | ICD-10-CM | POA: Diagnosis not present

## 2017-09-07 DIAGNOSIS — G8929 Other chronic pain: Secondary | ICD-10-CM | POA: Diagnosis not present

## 2017-09-07 DIAGNOSIS — R29818 Other symptoms and signs involving the nervous system: Secondary | ICD-10-CM | POA: Diagnosis not present

## 2017-09-07 DIAGNOSIS — R202 Paresthesia of skin: Secondary | ICD-10-CM | POA: Diagnosis not present

## 2017-09-07 DIAGNOSIS — Z981 Arthrodesis status: Secondary | ICD-10-CM | POA: Diagnosis not present

## 2017-09-07 DIAGNOSIS — Z9181 History of falling: Secondary | ICD-10-CM | POA: Diagnosis not present

## 2017-09-07 DIAGNOSIS — M6281 Muscle weakness (generalized): Secondary | ICD-10-CM | POA: Diagnosis not present

## 2017-09-07 DIAGNOSIS — M48061 Spinal stenosis, lumbar region without neurogenic claudication: Secondary | ICD-10-CM | POA: Diagnosis not present

## 2017-09-07 DIAGNOSIS — M545 Low back pain: Secondary | ICD-10-CM | POA: Diagnosis not present

## 2017-09-12 DIAGNOSIS — M48061 Spinal stenosis, lumbar region without neurogenic claudication: Secondary | ICD-10-CM | POA: Diagnosis not present

## 2017-09-12 DIAGNOSIS — G8929 Other chronic pain: Secondary | ICD-10-CM | POA: Diagnosis not present

## 2017-09-12 DIAGNOSIS — Z981 Arthrodesis status: Secondary | ICD-10-CM | POA: Diagnosis not present

## 2017-09-12 DIAGNOSIS — M6281 Muscle weakness (generalized): Secondary | ICD-10-CM | POA: Diagnosis not present

## 2017-09-12 DIAGNOSIS — Z9181 History of falling: Secondary | ICD-10-CM | POA: Diagnosis not present

## 2017-09-12 DIAGNOSIS — M545 Low back pain: Secondary | ICD-10-CM | POA: Diagnosis not present

## 2017-09-14 DIAGNOSIS — G8929 Other chronic pain: Secondary | ICD-10-CM | POA: Diagnosis not present

## 2017-09-14 DIAGNOSIS — M6281 Muscle weakness (generalized): Secondary | ICD-10-CM | POA: Diagnosis not present

## 2017-09-14 DIAGNOSIS — Z9181 History of falling: Secondary | ICD-10-CM | POA: Diagnosis not present

## 2017-09-14 DIAGNOSIS — M48061 Spinal stenosis, lumbar region without neurogenic claudication: Secondary | ICD-10-CM | POA: Diagnosis not present

## 2017-09-14 DIAGNOSIS — Z981 Arthrodesis status: Secondary | ICD-10-CM | POA: Diagnosis not present

## 2017-09-14 DIAGNOSIS — M545 Low back pain: Secondary | ICD-10-CM | POA: Diagnosis not present

## 2017-09-17 NOTE — Progress Notes (Signed)
Cardiology Office Note    Date:  09/18/2017   ID:  Gregory Guerrero, Gregory Guerrero June 05, 1945, MRN [id number]  PCP:  Colon Branch, MD  Cardiologist:  Dr.Jeanny Rymer   Chief Complaint: Dyspnea  History of Present Illness:   72 y.o. remote stenting of LAD in [name] . History of DVT, HTN, HLD and CRF Chronic venous disease with edema and right sided ulcers in past. Duplex negative for DVT 06/19/17 January 3154 uncomplicated right TKR with [facility]. Had severe UE neuropathy with arm weakness and finally had C34 discectomy and fusion with [name] March 2019   Echo reviewed 02/01/17 EF 00% grade 2 diastolic mild MR  ETT 86/7/61 normal   Unfortunately has not had much change in his UE symptoms since surgery   Past Medical History:  Diagnosis Date  . Arthritis   . CAD (coronary artery disease)   . Chronic kidney disease   . DVT (deep venous thrombosis) ([place])    following spinal surgery Nov 2017  . HTN (hypertension)   . MI (myocardial infarction) ([place])    1999 ; stents placed   . Numbness and tingling in both hands   . Spondylosis of cervical spine     Past Surgical History:  Procedure Laterality Date  . ANTERIOR CERVICAL DECOMP/DISCECTOMY FUSION N/A 06/27/2017   Procedure: ANTERIOR CERVICAL DECOMPRESSION/DISCECTOMY FUSION C3-4;  Surgeon: [name], MD;  Location: [facility];  Service: Orthopedics;  Laterality: N/A;  3 hrs  . ANTERIOR LAT LUMBAR FUSION N/A 03/01/2016   Procedure: EXTREME LATERAL INTERBODY FUSION  LUMBAR 2-4;  Surgeon: [name], MD;  Location: [facility];  Service: Orthopedics;  Laterality: N/A;  . CARDIAC CATHETERIZATION     20 yrs ago  . JOINT REPLACEMENT    . RADIOLOGY WITH ANESTHESIA N/A 05/15/2017   Procedure: MRI WITH ANESTHESIA CERVICAL SPINE WITH AND WITHOUT;  Surgeon: Radiologist, Medication, MD;  Location: [facility];  Service: Radiology;  Laterality: N/A;  . SPINAL FUSION N/A 03/02/2016   Procedure: POSTERIOR SPINAL FUSION INTERBODY L2-S1, DECOMPRESSION L4-S1;  Surgeon:  [name], MD;  Location: [facility];  Service: Orthopedics;  Laterality: N/A;  . TOTAL KNEE ARTHROPLASTY Right 04/30/2017   Procedure: RIGHT TOTAL KNEE ARTHROPLASTY;  Surgeon: [name], MD;  Location: [facility];  Service: Orthopedics;  Laterality: Right;    Current Medications: Prior to Admission medications   Medication Sig Start Date End Date Taking? Authorizing Provider  Ascorbic Acid (VITAMIN C) 1000 MG tablet Take 1,000 mg by mouth daily.    [provider]  aspirin EC 81 MG tablet Take 81 mg by mouth daily.    [provider]  furosemide (LASIX) 20 MG tablet Take 1 tablet (20 mg total) by mouth daily. 01/25/17   Colon Branch, MD  losartan (COZAAR) 100 MG tablet Take 1 tablet (100 mg total) by mouth daily. 01/05/17   [name], MD  Multiple Vitamin (MULTIVITAMIN WITH MINERALS) TABS tablet Take 1 tablet by mouth daily.    [provider]  rosuvastatin (CRESTOR) 20 MG tablet Take 1 tablet (20 mg total) by mouth at bedtime. 01/24/17   Colon Branch, MD    Allergies:   Patient has no known allergies.   Social History   Socioeconomic History  . Marital status: Married    Spouse name: Not on file  . Number of children: 2  . Years of education: Not on file  . Highest education level: Not on file  Occupational History  .  Occupation: retired , Research officer, political party   Social Needs  . Financial resource strain: Not on file  . Food insecurity:    Worry: Not on file    Inability: Not on file  . Transportation needs:    Medical: Not on file    Non-medical: Not on file  Tobacco Use  . Smoking status: Former Smoker    Types: Cigarettes    Last attempt to quit: 04/26/1983    Years since quitting: 34.4  . Smokeless tobacco: Never Used  . Tobacco comment: quit over 40 years ago   Substance and Sexual Activity  . Alcohol use: No  . Drug use: No  . Sexual activity: Never  Lifestyle  . Physical activity:    Days per week: Not on file    Minutes per session:  Not on file  . Stress: Not on file  Relationships  . Social connections:    Talks on phone: Not on file    Gets together: Not on file    Attends religious service: Not on file    Active member of club or organization: Not on file    Attends meetings of clubs or organizations: Not on file    Relationship status: Not on file  Other Topics Concern  . Not on file  Social History Narrative   Household pt, wife, a Pharmacologist from [place]      Family History:  The patient's family history includes Cancer - Other in his mother; Heart attack in his father; Uterine cancer in his sister.  ROS:   Please see the history of present illness.    ROS All other systems reviewed and are negative.   PHYSICAL EXAM:   VS:  BP (!) 158/90   Pulse 70   Ht 5' 8.5" (1.74 m)   Wt 246 lb 8 oz (111.8 kg)   SpO2 97%   BMI 36.94 kg/m    Affect appropriate Overweight white male  HEENT: recent cervical surgery with anterior scar  Neck supple with no adenopathy JVP normal no bruits no thyromegaly Lungs clear with no wheezing and good diaphragmatic motion Heart:  S1/S2 no murmur, no rub, gallop or click PMI normal Abdomen: benighn, BS positve, no tenderness, no AAA no bruit.  No HSM or HJR Distal pulses intact with no bruits Plus 2 RLE edema Neuro non-focal Skin warm and dry No muscular weakness Post right TKR    Wt Readings from Last 3 Encounters:  09/18/17 246 lb 8 oz (111.8 kg)  06/21/17 248 lb 12.8 oz (112.9 kg)  06/15/17 250 lb 4 oz (113.5 kg)      Studies/Labs Reviewed:   EKG:    02-14-2017 SR rate 71 normal PVC nonspecific ST changes   Recent Labs: 2017/02/14: Pro B Natriuretic peptide (BNP) 295.0 06/15/2017: ALT 12 06/21/2017: BUN 25; Creatinine, Ser 1.44; Hemoglobin 12.5; Platelets 179; Potassium 4.8; Sodium 141   Lipid Panel    Component Value Date/Time   CHOL 138 06/15/2017 1458   TRIG 113 06/15/2017 1458   HDL 41 06/15/2017 1458   CHOLHDL 3.4 06/15/2017 1458   CHOLHDL 4  06/24/2013 1113   VLDL 16.2 06/24/2013 1113   LDLCALC 74 06/15/2017 1458    Additional studies/ records that were reviewed today include:  AS above    ASSESSMENT & PLAN:     1. CAD s/p remote LAD stenting -  No chest pain. Continue ASA and statin. ETT 02/23/17 non ischemic continue ASA and beta  blocker   2. HTN- Well controlled.  Continue current medications and low sodium Dash type diet.    3. HLD-  Continue statin labs with primary   4. CKD stage III  Baseline Cr 1.5 stable   5. Ortho:  Post right TKR continue PT/OT PRN lasix RLE duplex negative for DVT   6. Neuropathy:  Post C3-4 discectomy with fusion 06/27/17 [name]  PT/OT continue Gabapentin    Jenkins Rouge

## 2017-09-18 ENCOUNTER — Encounter: Payer: Self-pay | Admitting: Cardiovascular Disease

## 2017-09-18 ENCOUNTER — Ambulatory Visit (INDEPENDENT_AMBULATORY_CARE_PROVIDER_SITE_OTHER): Payer: Medicare Other | Admitting: Cardiovascular Disease

## 2017-09-18 VITALS — BP 158/90 | HR 70 | Ht 68.5 in | Wt 246.5 lb

## 2017-09-18 DIAGNOSIS — E785 Hyperlipidemia, unspecified: Secondary | ICD-10-CM | POA: Diagnosis not present

## 2017-09-18 DIAGNOSIS — I10 Essential (primary) hypertension: Secondary | ICD-10-CM

## 2017-09-18 DIAGNOSIS — I2510 Atherosclerotic heart disease of native coronary artery without angina pectoris: Secondary | ICD-10-CM

## 2017-09-18 NOTE — Patient Instructions (Signed)

## 2017-09-25 DIAGNOSIS — M48061 Spinal stenosis, lumbar region without neurogenic claudication: Secondary | ICD-10-CM | POA: Diagnosis not present

## 2017-09-25 DIAGNOSIS — Z981 Arthrodesis status: Secondary | ICD-10-CM | POA: Diagnosis not present

## 2017-09-25 DIAGNOSIS — M6281 Muscle weakness (generalized): Secondary | ICD-10-CM | POA: Diagnosis not present

## 2017-09-25 DIAGNOSIS — Z9181 History of falling: Secondary | ICD-10-CM | POA: Diagnosis not present

## 2017-09-25 DIAGNOSIS — R269 Unspecified abnormalities of gait and mobility: Secondary | ICD-10-CM | POA: Diagnosis not present

## 2017-09-27 DIAGNOSIS — M48061 Spinal stenosis, lumbar region without neurogenic claudication: Secondary | ICD-10-CM | POA: Diagnosis not present

## 2017-09-27 DIAGNOSIS — R269 Unspecified abnormalities of gait and mobility: Secondary | ICD-10-CM | POA: Diagnosis not present

## 2017-09-27 DIAGNOSIS — Z981 Arthrodesis status: Secondary | ICD-10-CM | POA: Diagnosis not present

## 2017-09-27 DIAGNOSIS — M6281 Muscle weakness (generalized): Secondary | ICD-10-CM | POA: Diagnosis not present

## 2017-09-27 DIAGNOSIS — Z9181 History of falling: Secondary | ICD-10-CM | POA: Diagnosis not present

## 2017-10-02 DIAGNOSIS — M961 Postlaminectomy syndrome, not elsewhere classified: Secondary | ICD-10-CM | POA: Diagnosis not present

## 2017-10-02 DIAGNOSIS — R200 Anesthesia of skin: Secondary | ICD-10-CM | POA: Diagnosis not present

## 2017-10-04 DIAGNOSIS — M48061 Spinal stenosis, lumbar region without neurogenic claudication: Secondary | ICD-10-CM | POA: Diagnosis not present

## 2017-10-04 DIAGNOSIS — Z981 Arthrodesis status: Secondary | ICD-10-CM | POA: Diagnosis not present

## 2017-10-04 DIAGNOSIS — R269 Unspecified abnormalities of gait and mobility: Secondary | ICD-10-CM | POA: Diagnosis not present

## 2017-10-04 DIAGNOSIS — M6281 Muscle weakness (generalized): Secondary | ICD-10-CM | POA: Diagnosis not present

## 2017-10-04 DIAGNOSIS — Z9181 History of falling: Secondary | ICD-10-CM | POA: Diagnosis not present

## 2017-10-09 DIAGNOSIS — Z981 Arthrodesis status: Secondary | ICD-10-CM | POA: Diagnosis not present

## 2017-10-09 DIAGNOSIS — M6281 Muscle weakness (generalized): Secondary | ICD-10-CM | POA: Diagnosis not present

## 2017-10-09 DIAGNOSIS — M48061 Spinal stenosis, lumbar region without neurogenic claudication: Secondary | ICD-10-CM | POA: Diagnosis not present

## 2017-10-09 DIAGNOSIS — Z9181 History of falling: Secondary | ICD-10-CM | POA: Diagnosis not present

## 2017-10-09 DIAGNOSIS — R269 Unspecified abnormalities of gait and mobility: Secondary | ICD-10-CM | POA: Diagnosis not present

## 2017-10-11 DIAGNOSIS — Z981 Arthrodesis status: Secondary | ICD-10-CM | POA: Diagnosis not present

## 2017-10-11 DIAGNOSIS — M48061 Spinal stenosis, lumbar region without neurogenic claudication: Secondary | ICD-10-CM | POA: Diagnosis not present

## 2017-10-11 DIAGNOSIS — R269 Unspecified abnormalities of gait and mobility: Secondary | ICD-10-CM | POA: Diagnosis not present

## 2017-10-11 DIAGNOSIS — M6281 Muscle weakness (generalized): Secondary | ICD-10-CM | POA: Diagnosis not present

## 2017-10-11 DIAGNOSIS — Z9181 History of falling: Secondary | ICD-10-CM | POA: Diagnosis not present

## 2017-10-22 DIAGNOSIS — G5601 Carpal tunnel syndrome, right upper limb: Secondary | ICD-10-CM | POA: Diagnosis not present

## 2017-10-22 DIAGNOSIS — G5602 Carpal tunnel syndrome, left upper limb: Secondary | ICD-10-CM | POA: Diagnosis not present

## 2017-10-30 DIAGNOSIS — G5602 Carpal tunnel syndrome, left upper limb: Secondary | ICD-10-CM | POA: Diagnosis not present

## 2017-10-30 DIAGNOSIS — G5601 Carpal tunnel syndrome, right upper limb: Secondary | ICD-10-CM | POA: Diagnosis not present

## 2017-11-10 IMAGING — NM NM PULMONARY VENT & PERF
13 series · 13 of 13 positions shown · non-contrast
Comparison: 01/24/2017 chest radiograph

CLINICAL DATA: 70-year-old male with shortness of breath for 6
weeks.

EXAM:
NUCLEAR MEDICINE VENTILATION - PERFUSION LUNG SCAN
TECHNIQUE: Ventilation images were obtained in multiple projections using
inhaled aerosol 1c-BBm DTPA. Perfusion images were obtained in
multiple projections after intravenous injection of 1c-BBm MAA.
RADIOPHARMACEUTICALS:  30.8 mCi Cechnetium-DDm DTPA aerosol
inhalation and 4.2 mCi Cechnetium-DDm MAA IV

[Series 1: ant/post vent · 4.14mm/px · 1 of 1 slices shown]
[im 1/1]
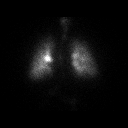

[Series 2: lao/rpo vent · 4.14mm/px · 1 of 1 slices shown (1 of 2)]
[im 1/1]
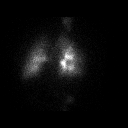

[Series 2: lao/rpo vent · 4.14mm/px · 1 of 1 slices shown (2 of 2)]
[im 1/1]
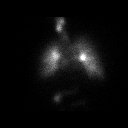

[Series 3: lpo/rao vent · 4.14mm/px · 1 of 1 slices shown (1 of 2)]
[im 1/1]
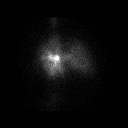

[Series 3: lpo/rao vent · 4.14mm/px · 1 of 1 slices shown (2 of 2)]
[im 1/1]
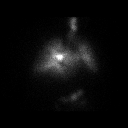

[Series 4: lt lat/rt lat vent · 4.14mm/px · 1 of 1 slices shown (1 of 2)]
[im 1/1]
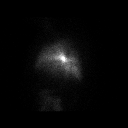

[Series 4: lt lat/rt lat vent · 4.14mm/px · 1 of 1 slices shown (2 of 2)]
[im 1/1]
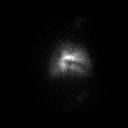

[Series 5: lt lat/rt lat perf · 4.14mm/px · 1 of 1 slices shown (1 of 2)]
[im 1/1]
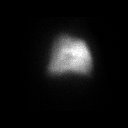

[Series 5: lt lat/rt lat perf · 4.14mm/px · 1 of 1 slices shown (2 of 2)]
[im 1/1]
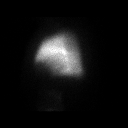

[Series 6: lpo/rao perf · 4.14mm/px · 1 of 1 slices shown (1 of 2)]
[im 1/1]
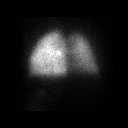

[Series 6: lpo/rao perf · 4.14mm/px · 1 of 1 slices shown (2 of 2)]
[im 1/1]
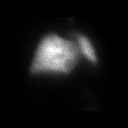

[Series 7: ant/post perf · 4.14mm/px · 1 of 1 slices shown (1 of 2)]
[im 1/1]
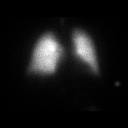

[Series 7: ant/post perf · 4.14mm/px · 1 of 1 slices shown (2 of 2)]
[im 1/1]
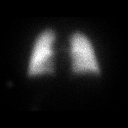

[13 of 13 positions shown; findings below may reference images not displayed]

FINDINGS: Ventilation: No focal ventilation defect. Mild central clumping
noted.

Perfusion: No wedge shaped peripheral perfusion defects to suggest
acute pulmonary embolism.
IMPRESSION: Normal perfusion.  No evidence of pulmonary emboli.

## 2017-11-15 DIAGNOSIS — G5602 Carpal tunnel syndrome, left upper limb: Secondary | ICD-10-CM | POA: Diagnosis not present

## 2017-12-19 ENCOUNTER — Other Ambulatory Visit: Payer: Medicare Other

## 2018-01-04 ENCOUNTER — Other Ambulatory Visit: Payer: Medicare Other | Admitting: *Deleted

## 2018-01-04 DIAGNOSIS — E785 Hyperlipidemia, unspecified: Secondary | ICD-10-CM | POA: Diagnosis not present

## 2018-01-04 DIAGNOSIS — I2510 Atherosclerotic heart disease of native coronary artery without angina pectoris: Secondary | ICD-10-CM | POA: Diagnosis not present

## 2018-01-04 DIAGNOSIS — Z79899 Other long term (current) drug therapy: Secondary | ICD-10-CM | POA: Diagnosis not present

## 2018-01-05 LAB — HEPATIC FUNCTION PANEL
ALBUMIN: 4.2 g/dL (ref 3.5–4.8)
ALT: 15 IU/L (ref 0–44)
AST: 21 IU/L (ref 0–40)
Alkaline Phosphatase: 103 IU/L (ref 39–117)
BILIRUBIN TOTAL: 0.9 mg/dL (ref 0.0–1.2)
BILIRUBIN, DIRECT: 0.22 mg/dL (ref 0.00–0.40)
TOTAL PROTEIN: 6.2 g/dL (ref 6.0–8.5)

## 2018-01-05 LAB — LIPID PANEL
Chol/HDL Ratio: 3.5 ratio (ref 0.0–5.0)
Cholesterol, Total: 144 mg/dL (ref 100–199)
HDL: 41 mg/dL (ref >39)
LDL CALC: 85 mg/dL (ref 0–99)
Triglycerides: 88 mg/dL (ref 0–149)
VLDL CHOLESTEROL CAL: 18 mg/dL (ref 5–40)

## 2018-01-07 ENCOUNTER — Telehealth: Payer: Self-pay

## 2018-01-07 DIAGNOSIS — E782 Mixed hyperlipidemia: Secondary | ICD-10-CM

## 2018-01-07 MED ORDER — ATORVASTATIN CALCIUM 80 MG PO TABS: ORAL_TABLET | ORAL | 3 refills | Status: DC

## 2018-01-07 NOTE — Telephone Encounter (Signed)
Patient aware of lab results. Per Dr. Johnsie Cancel, LDL over goal alternate 80 mg  With 40 mg lipitor f/u labs 3 months. Patient will come in 04/08/18 for lab work. Patient verbalized understanding.

## 2018-01-07 NOTE — Telephone Encounter (Signed)
-----   Message from [name], MD sent at 01/05/2018  3:53 PM EDT ----- LDL over goal alternate 80 mg  With 40 mg lipitor f/u labs 3 months

## 2018-01-08 DIAGNOSIS — M48061 Spinal stenosis, lumbar region without neurogenic claudication: Secondary | ICD-10-CM | POA: Diagnosis not present

## 2018-01-15 ENCOUNTER — Other Ambulatory Visit: Payer: Medicare Other

## 2018-01-15 DIAGNOSIS — E782 Mixed hyperlipidemia: Secondary | ICD-10-CM

## 2018-01-16 LAB — LIPID PANEL
CHOL/HDL RATIO: 4 ratio (ref 0.0–5.0)
Cholesterol, Total: 166 mg/dL (ref 100–199)
HDL: 42 mg/dL (ref >39)
LDL Calculated: 86 mg/dL (ref 0–99)
TRIGLYCERIDES: 189 mg/dL — AB (ref 0–149)
VLDL CHOLESTEROL CAL: 38 mg/dL (ref 5–40)

## 2018-01-16 LAB — HEPATIC FUNCTION PANEL
ALK PHOS: 103 IU/L (ref 39–117)
ALT: 17 IU/L (ref 0–44)
AST: 23 IU/L (ref 0–40)
Albumin: 4.2 g/dL (ref 3.5–4.8)
BILIRUBIN TOTAL: 0.8 mg/dL (ref 0.0–1.2)
BILIRUBIN, DIRECT: 0.2 mg/dL (ref 0.00–0.40)
TOTAL PROTEIN: 6.4 g/dL (ref 6.0–8.5)

## 2018-01-25 ENCOUNTER — Other Ambulatory Visit: Payer: Self-pay | Admitting: Cardiovascular Disease

## 2018-02-04 DIAGNOSIS — Z23 Encounter for immunization: Secondary | ICD-10-CM | POA: Diagnosis not present

## 2018-02-15 ENCOUNTER — Ambulatory Visit: Payer: Medicare Other | Admitting: *Deleted

## 2018-02-19 ENCOUNTER — Telehealth: Payer: Self-pay | Admitting: Cardiovascular Disease

## 2018-02-19 NOTE — Telephone Encounter (Signed)
New message    Pt c/o medication issue:  1. Name of Medication:  gabapentin (NEURONTIN) 300 MG capsule Take 300-600 mg by mouth 3 (three) times daily. 300-600 mg po tid prn for neuralgia and peripheral neuropathy    losartan (COZAAR) 100 MG tablet TAKE 1 TABLET BY MOUTH ONCE DAILY     2. How are you currently taking this medication (dosage and times per day)? As prescribed  3. Are you having a reaction (difficulty breathing--STAT)? Not sure   4. What is your medication issue? Patient wife states that it says online that you should not mix these to medications together, that they will cause an interaction , please call she is concerned that this could be causing him problems

## 2018-02-20 NOTE — Telephone Encounter (Signed)
Called patient's wife back. Informed her of advisement form pharmacist. Patient's wife verbalized understanding.

## 2018-02-20 NOTE — Telephone Encounter (Signed)
There is no drug interaction between these medications. They should be fine to take together.

## 2018-02-20 NOTE — Telephone Encounter (Signed)
Will forward to pharmacist for information on interaction between gabapentin and losartan.

## 2018-02-25 ENCOUNTER — Ambulatory Visit (HOSPITAL_BASED_OUTPATIENT_CLINIC_OR_DEPARTMENT_OTHER)
Admission: RE | Admit: 2018-02-25 | Discharge: 2018-02-25 | Disposition: A | Discharge status: Home / Self Care | Payer: Medicare Other | Source: Ambulatory Visit | Attending: Family Medicine | Admitting: Family Medicine

## 2018-02-25 ENCOUNTER — Encounter: Payer: Self-pay | Admitting: Family Medicine

## 2018-02-25 ENCOUNTER — Ambulatory Visit (INDEPENDENT_AMBULATORY_CARE_PROVIDER_SITE_OTHER): Payer: Medicare Other | Admitting: Family Medicine

## 2018-02-25 ENCOUNTER — Encounter (HOSPITAL_BASED_OUTPATIENT_CLINIC_OR_DEPARTMENT_OTHER): Payer: Self-pay

## 2018-02-25 VITALS — BP 148/88 | HR 63 | Temp 98.0°F | Resp 18 | Ht 68.5 in | Wt 256.0 lb

## 2018-02-25 DIAGNOSIS — R0602 Shortness of breath: Secondary | ICD-10-CM

## 2018-02-25 DIAGNOSIS — I2510 Atherosclerotic heart disease of native coronary artery without angina pectoris: Secondary | ICD-10-CM | POA: Insufficient documentation

## 2018-02-25 DIAGNOSIS — R7989 Other specified abnormal findings of blood chemistry: Secondary | ICD-10-CM | POA: Insufficient documentation

## 2018-02-25 DIAGNOSIS — I517 Cardiomegaly: Secondary | ICD-10-CM | POA: Diagnosis not present

## 2018-02-25 DIAGNOSIS — J90 Pleural effusion, not elsewhere classified: Secondary | ICD-10-CM | POA: Insufficient documentation

## 2018-02-25 DIAGNOSIS — R600 Localized edema: Secondary | ICD-10-CM

## 2018-02-25 DIAGNOSIS — R0601 Orthopnea: Secondary | ICD-10-CM

## 2018-02-25 DIAGNOSIS — I700 Atherosclerosis of aorta: Secondary | ICD-10-CM | POA: Diagnosis not present

## 2018-02-25 DIAGNOSIS — J9811 Atelectasis: Secondary | ICD-10-CM | POA: Diagnosis not present

## 2018-02-25 DIAGNOSIS — Z86718 Personal history of other venous thrombosis and embolism: Secondary | ICD-10-CM | POA: Diagnosis not present

## 2018-02-25 DIAGNOSIS — J42 Unspecified chronic bronchitis: Secondary | ICD-10-CM | POA: Insufficient documentation

## 2018-02-25 DIAGNOSIS — J209 Acute bronchitis, unspecified: Secondary | ICD-10-CM

## 2018-02-25 LAB — BASIC METABOLIC PANEL
BUN: 21 mg/dL (ref 6–23)
CHLORIDE: 106 meq/L (ref 96–112)
CO2: 28 meq/L (ref 19–32)
CREATININE: 1.29 mg/dL (ref 0.40–1.50)
Calcium: 9.2 mg/dL (ref 8.4–10.5)
GFR: 57.86 mL/min — ABNORMAL LOW (ref >60.00)
Glucose, Bld: 82 mg/dL (ref 70–99)
Potassium: 4.9 mEq/L (ref 3.5–5.1)
Sodium: 141 mEq/L (ref 135–145)

## 2018-02-25 LAB — D-DIMER, QUANTITATIVE: D-Dimer, Quant: 2.93 mcg/mL FEU — ABNORMAL HIGH (ref <0.50)

## 2018-02-25 LAB — CBC
HEMATOCRIT: 42.3 % (ref 39.0–52.0)
Hemoglobin: 14 g/dL (ref 13.0–17.0)
MCHC: 33.2 g/dL (ref 30.0–36.0)
MCV: 97.7 fl (ref 78.0–100.0)
Platelets: 197 10*3/uL (ref 150.0–400.0)
RBC: 4.33 Mil/uL (ref 4.22–5.81)
RDW: 13.4 % (ref 11.5–15.5)
WBC: 6.8 10*3/uL (ref 4.0–10.5)

## 2018-02-25 LAB — BRAIN NATRIURETIC PEPTIDE: PRO B NATRI PEPTIDE: 697 pg/mL — AB (ref 0.0–100.0)

## 2018-02-25 MED ORDER — DOXYCYCLINE HYCLATE 100 MG PO CAPS: 100.0000 mg | ORAL_CAPSULE | Freq: Two times a day (BID) | ORAL | 0 refills | Status: DC

## 2018-02-25 MED ORDER — FUROSEMIDE 20 MG PO TABS: 20.0000 mg | ORAL_TABLET | ORAL | 1 refills | Status: DC

## 2018-02-25 MED ORDER — PREDNISONE 20 MG PO TABS: ORAL_TABLET | ORAL | 0 refills | Status: DC

## 2018-02-25 MED ORDER — IOPAMIDOL (ISOVUE-370) INJECTION 76%
100.0000 mL | Freq: Once | INTRAVENOUS | Status: AC | PRN
  Administered 2018-02-25: 100 mL via INTRAVENOUS

## 2018-02-25 MED ORDER — ALBUTEROL SULFATE HFA 108 (90 BASE) MCG/ACT IN AERS: 2.0000 | INHALATION_SPRAY | Freq: Four times a day (QID) | RESPIRATORY_TRACT | 0 refills | Status: DC | PRN

## 2018-02-25 NOTE — Patient Instructions (Signed)
I will call you later on today with your labs - we are going to make sure no sign of a blood clot or heart failure Use the doxycycline antibioitc twice a day for 10 days Inhaler as needed Prednisone for 3-6 days

## 2018-02-25 NOTE — Progress Notes (Addendum)
[facility] [place], [place], [place] [address] [phone]  Date:  02/25/2018   Name:  Gregory Guerrero   DOB:  1945/11/26   MRN:  [id number]  PCP:  Colon Branch, MD    Chief Complaint: Wheezing (shortness of breath, wheezing, coughing, 2 weeks)   History of Present Illness:  Gregory Guerrero is a 72 y.o. very pleasant male patient who presents with the following:  Pt of Dr Larose Kells who I have not seen in the past, here today with concern of illness for about 2 weeks History of DM (?- per pt he does NOT have DM but it is listed),MI/ CAD, spinal stenosis/ lumbar fusion 11/17 and cervical fusion 3/17, HTN No history of asthma  No fever noted   Lab Results  Component Value Date   HGBA1C 5.5 01/04/2017   He has noted wheezing, this may keep him awake at night  He is better when sitting up straight He does note orthopnea  Overall his sx are much worse when supine  He has left > right ankle edema which will wax and wane- this is not new, but it is worse than usual now He is not coughing much He is feeling SOB He feels weaker with exertion No CP  No palpations   He has lasix 20 mg and is supposed to take it three times a week but he is not really taking this due to concern about urinary frequency  He did have an echo last year which showed diastolic dysfunction He also did a stress last year which was ok   Wt Readings from Last 3 Encounters:  02/25/18 256 lb (116.1 kg)  09/18/17 246 lb 8 oz (111.8 kg)  06/21/17 248 lb 12.8 oz (112.9 kg)   Per most recent note by Dr. Johnsie Cancel, June of this year: 1. CAD s/p remote LAD stenting -  No chest pain. Continue ASA and statin. ETT 02/23/17 non ischemic continue ASA and beta blocker  2. HTN- Well controlled.  Continue current medications and low sodium Dash type diet.   3. HLD-  Continue statin labs with primary  4. CKD stage III  Baseline Cr 1.5 stable  5. Ortho:  Post  right TKR continue PT/OT PRN lasix RLE duplex negative for DVT  6. Neuropathy:  Post C3-4 discectomy with fusion 06/27/17 [name]  PT/OT continue Gabapentin   He did have a mild DVT in 2017 following back surgery- this was treated with ?xarelto for about 3 weeks  Accompanied by his wife today who contributes to the history  Patient Active Problem List   Diagnosis Date Noted  . S/P cervical spinal fusion 06/27/2017  . OA (osteoarthritis) of knee 04/30/2017  . Edema extremities 02/15/2017  . PCP NOTES >>>>>>>>> 01/25/2017  . Spinal stenosis of lumbar region without neurogenic claudication 01/23/2017  . Diabetes mellitus ([place]) 04/25/2013  . Elevated lipids 04/25/2013  . CAD (coronary artery disease)   . HTN (hypertension)     Past Medical History:  Diagnosis Date  . Arthritis   . CAD (coronary artery disease)   . Chronic kidney disease   . DVT (deep venous thrombosis) ([place])    following spinal surgery Nov 2017  . HTN (hypertension)   . MI (myocardial infarction) ([place])    1999 ; stents placed   . Numbness and tingling in both hands   . Spondylosis of cervical spine  Past Surgical History:  Procedure Laterality Date  . ANTERIOR CERVICAL DECOMP/DISCECTOMY FUSION N/A 06/27/2017   Procedure: ANTERIOR CERVICAL DECOMPRESSION/DISCECTOMY FUSION C3-4;  Surgeon: [name], MD;  Location: [facility];  Service: Orthopedics;  Laterality: N/A;  3 hrs  . ANTERIOR LAT LUMBAR FUSION N/A 03/01/2016   Procedure: EXTREME LATERAL INTERBODY FUSION  LUMBAR 2-4;  Surgeon: [name], MD;  Location: [facility];  Service: Orthopedics;  Laterality: N/A;  . CARDIAC CATHETERIZATION     20 yrs ago  . JOINT REPLACEMENT    . RADIOLOGY WITH ANESTHESIA N/A 05/15/2017   Procedure: MRI WITH ANESTHESIA CERVICAL SPINE WITH AND WITHOUT;  Surgeon: Radiologist, Medication, MD;  Location: [facility];  Service: Radiology;  Laterality: N/A;  . SPINAL FUSION N/A 03/02/2016   Procedure: POSTERIOR SPINAL FUSION INTERBODY  L2-S1, DECOMPRESSION L4-S1;  Surgeon: [name], MD;  Location: [facility];  Service: Orthopedics;  Laterality: N/A;  . TOTAL KNEE ARTHROPLASTY Right 04/30/2017   Procedure: RIGHT TOTAL KNEE ARTHROPLASTY;  Surgeon: [name], MD;  Location: [facility];  Service: Orthopedics;  Laterality: Right;    Social History   Tobacco Use  . Smoking status: Former Smoker    Types: Cigarettes    Last attempt to quit: 04/26/1983    Years since quitting: 34.8  . Smokeless tobacco: Never Used  . Tobacco comment: quit over 40 years ago   Substance Use Topics  . Alcohol use: No  . Drug use: No    Family History  Problem Relation Age of Onset  . Cancer - Other Mother   . Heart attack Father   . Uterine cancer Sister   . Colon cancer Neg Hx   . Prostate cancer Neg Hx     No Known Allergies  Medication list has been reviewed and updated.  Current Outpatient Medications on File Prior to Visit  Medication Sig Dispense Refill  . atorvastatin (LIPITOR) 80 MG tablet Take 80 mg by mouth every other day, take 40 mg (1/2 tablet) by mouth on alternate days. 69 tablet 3  . gabapentin (NEURONTIN) 300 MG capsule Take 300-600 mg by mouth 3 (three) times daily. 300-600 mg po tid prn for neuralgia and peripheral neuropathy    . losartan (COZAAR) 100 MG tablet TAKE 1 TABLET BY MOUTH ONCE DAILY 90 tablet 2  . Magnesium 400 MG CAPS Take by mouth.    Marland Kitchen UNABLE TO FIND Med Name: Turmeric 1500 mg, one daily    . metoprolol tartrate (LOPRESSOR) 25 MG tablet Take 1 tablet (25 mg total) 2 (two) times daily by mouth. 180 tablet 3   No current facility-administered medications on file prior to visit.     Review of Systems:  As per HPI- otherwise negative.   Physical Examination: Vitals:   02/25/18 1116  BP: (!) 148/88  Pulse: 63  Resp: 18  Temp: 98 F (36.7 C)  SpO2: 95%   Vitals:   02/25/18 1116  Weight: 256 lb (116.1 kg)  Height: 5' 8.5" (1.74 m)   Body mass index is 38.36 kg/m. Ideal Body Weight:  Weight in (lb) to have BMI = 25: 166.5  GEN: WDWN, NAD, Non-toxic, A & O x 3, obese, looks well  HEENT: Atraumatic, Normocephalic. Neck supple. No masses, No LAD.  Bilateral TM wnl, oropharynx normal.  PEERL,EOMI.   Ears and Nose: No external deformity. CV: RRR, No M/G/R. No JVD. No thrill. No extra heart sounds. PULM: CTA B, no wheezes, crackles, rhonchi. No retractions. No resp. distress. No accessory  muscle use. When supine demonstrates his wheezing to me- it seems like forced expirations  ABD: S, NT, ND. No rebound. No HSM. EXTR: No c/c. He does have 2+ edema of the left ankle, trace to 1+ on the right NEURO Normal gait.  PSYCH: Normally interactive. Conversant. Not depressed or anxious appearing.  Calm demeanor.   EKG: sinus brady, no ST changes or other acutely concerning findings  Compared with tracing from 3/19- no concerning change   Obtained chest film: IMPRESSION: Chronic bronchitic changes with possible superimposed acute bronchitis in the right infrahilar region. Stable trace bilateral pleural effusions. No alveolar pneumonia nor pulmonary edema.  Thoracic aortic atherosclerosis.  Assessment and Plan: SOB (shortness of breath) - Plan: DG Chest 2 View, EKG 12-Lead, B Nat Peptide, D-Dimer, Quantitative, Basic metabolic panel, CBC, CT Angio Chest W/Cm &/Or Wo Cm  Orthopnea  Acute bronchitis, unspecified organism - Plan: albuterol (PROVENTIL HFA;VENTOLIN HFA) 108 (90 Base) MCG/ACT inhaler, doxycycline (VIBRAMYCIN) 100 MG capsule, DISCONTINUED: predniSONE (DELTASONE) 20 MG tablet  Leg edema, left - Plan: furosemide (LASIX) 20 MG tablet, US Venous Img Lower Bilateral  Here today with weight increase of about 10 lbs and sx concerning for CHF.  His plain chest film also mentions possible bronchitis Decided to treat with doxy and albuterol while other labs pending.  Ordered D dimer due to SOB, ankle edema and history of DVT.  Pt denies CP so did not obtain troponin.  Also BNP  for help eval for CHF  Called upon receipt of elevated D dimer He will come back in to have [place] and CT angio today We have upgraded his BMP to stat so we can get renal function   Received his doppler and CT angio- spoke with wife. He does likely have CHF as BNP is elevated. Renal function is ok. Will continue doxy and albuterol but add lasix which they have at home.  Take 20 mg BID- one first thing, one at noon- until weight is normalized Do daily weights and record Message to Dr. Johnsie Cancel re: follow-up this week If not able to see cardiology myself or Dr. Larose Kells will see him later on this well  If not doing better in a couple of days or if worse seek help   >60 minutes spent in face to face time with patient, >50% spent in counselling or coordination of care   Signed Lamar Blinks, MD  Results for orders placed or performed in visit on 02/25/18  B Nat Peptide  Result Value Ref Range   Pro B Natriuretic peptide (BNP) 697.0 (H) 0.0 - 100.0 pg/mL  D-Dimer, Quantitative  Result Value Ref Range   D-Dimer, Quant 2.93 (H) <0.50 mcg/mL FEU  Basic metabolic panel  Result Value Ref Range   Sodium 141 135 - 145 mEq/L   Potassium 4.9 3.5 - 5.1 mEq/L   Chloride 106 96 - 112 mEq/L   CO2 28 19 - 32 mEq/L   Glucose, Bld 82 70 - 99 mg/dL   BUN 21 6 - 23 mg/dL   Creatinine, Ser 1.29 0.40 - 1.50 mg/dL   Calcium 9.2 8.4 - 10.5 mg/dL   GFR 57.86 (L) >60.00 mL/min  CBC  Result Value Ref Range   WBC 6.8 4.0 - 10.5 K/uL   RBC 4.33 4.22 - 5.81 Mil/uL   Platelets 197.0 150.0 - 400.0 K/uL   Hemoglobin 14.0 13.0 - 17.0 g/dL   HCT 42.3 39.0 - 52.0 %   MCV 97.7 78.0 - 100.0 fl  MCHC 33.2 30.0 - 36.0 g/dL   RDW 13.4 11.5 - 15.5 %      Dg Chest 2 View  Result Date: 02/25/2018 CLINICAL DATA:  Chest pain and shortness of breath for the past several days. History of previous MI, chronic renal insufficiency. Former smoker. EXAM: CHEST - 2 VIEW COMPARISON:  PA and lateral chest x-ray of January 24, 2017 FINDINGS: The lungs are mildly hyperinflated. There is a trace of pleural fluid on the left with minimal chronic blunting of the right lateral and posterior costophrenic angles which has improved since the previous study. The interstitial markings of both lungs are coarse. The lung markings are increased in the right infrahilar region. The heart and pulmonary vascularity are normal. There is calcification in the wall of the aortic arch. There is a calcified lymph node in the left paratracheal region. There is multilevel degenerative disc disease of the thoracic spine with calcification of the anterior longitudinal ligament. IMPRESSION: Chronic bronchitic changes with possible superimposed acute bronchitis in the right infrahilar region. Stable trace bilateral pleural effusions. No alveolar pneumonia nor pulmonary edema. Thoracic aortic atherosclerosis. Electronically Signed   By: David  Martinique M.D.   On: 02/25/2018 12:19   Ct Angio Chest W/cm &/or Wo Cm  Result Date: 02/25/2018 CLINICAL DATA:  72 year old male with shortness of breath and wheezing for 2 weeks with abnormal D-dimer. EXAM: CT ANGIOGRAPHY CHEST WITH CONTRAST TECHNIQUE: Multidetector CT imaging of the chest was performed using the standard protocol during bolus administration of intravenous contrast. Multiplanar CT image reconstructions and MIPs were obtained to evaluate the vascular anatomy. CONTRAST:  162mL ISOVUE-370 IOPAMIDOL (ISOVUE-370) INJECTION 76% COMPARISON:  Chest radiographs 1201 hours today. CT Abdomen and Pelvis 07/21/2016. FINDINGS: Cardiovascular: Good contrast bolus timing in the pulmonary arterial tree. Mild respiratory motion. No focal filling defect identified in the pulmonary arteries to suggest acute pulmonary embolism. Calcified coronary artery atherosclerosis and/or stents. Cardiac size at the upper limits of normal to mildly increased. No pericardial effusion. Comparatively mild Calcified aortic atherosclerosis.  Mediastinum/Nodes: Negative. No lymphadenopathy. Lungs/Pleura: Small to moderate size layering left and small layering right pleural effusions are new since the 2018 CT. Simple fluid density suggesting transudate. Major airways are patent. Enhancing compressive atelectasis at the left lung base. Mild lower lobe ground-glass opacity in both lobes also most resembles atelectasis. No consolidation or area suspicious for pulmonary infection. Upper Abdomen: Negative visible liver, spleen, pancreas, adrenal glands, kidneys, and bowel in the upper abdomen. Musculoskeletal: Degenerative changes and flowing osteophytes throughout the thoracic spine. No acute osseous abnormality identified. Review of the MIP images confirms the above findings. IMPRESSION: 1. Negative for acute pulmonary embolus. 2. Small to moderate layering left and small layering right pleural effusions are new since 2018. Associated atelectasis, but no other acute pulmonary process. 3. Calcified coronary artery atherosclerosis and/or stents. Borderline to mild cardiomegaly. Electronically Signed   By: Genevie Ann M.D.   On: 02/25/2018 17:06   US Venous Img Lower Bilateral  Result Date: 02/25/2018 CLINICAL DATA:  72 year old male with a history bilateral lower extremity edema EXAM: BILATERAL LOWER EXTREMITY VENOUS DOPPLER ULTRASOUND TECHNIQUE: Gray-scale sonography with graded compression, as well as color Doppler and duplex ultrasound were performed to evaluate the lower extremity deep venous systems from the level of the common femoral vein and including the common femoral, femoral, profunda femoral, popliteal and calf veins including the posterior tibial, peroneal and gastrocnemius veins when visible. The superficial great saphenous vein was also interrogated. Spectral Doppler was  utilized to evaluate flow at rest and with distal augmentation maneuvers in the common femoral, femoral and popliteal veins. COMPARISON:  None. FINDINGS: RIGHT LOWER EXTREMITY  Common Femoral Vein: No evidence of thrombus. Normal compressibility, respiratory phasicity and response to augmentation. Saphenofemoral Junction: No evidence of thrombus. Normal compressibility and flow on color Doppler imaging. Profunda Femoral Vein: No evidence of thrombus. Normal compressibility and flow on color Doppler imaging. Femoral Vein: No evidence of thrombus. Normal compressibility, respiratory phasicity and response to augmentation. Popliteal Vein: No evidence of thrombus. Normal compressibility, respiratory phasicity and response to augmentation. Calf Veins: No evidence of thrombus. Normal compressibility and flow on color Doppler imaging. Superficial Great Saphenous Vein: No evidence of thrombus. Normal compressibility and flow on color Doppler imaging. Other Findings:  Edema LEFT LOWER EXTREMITY Common Femoral Vein: No evidence of thrombus. Normal compressibility, respiratory phasicity and response to augmentation. Saphenofemoral Junction: No evidence of thrombus. Normal compressibility and flow on color Doppler imaging. Profunda Femoral Vein: No evidence of thrombus. Normal compressibility and flow on color Doppler imaging. Femoral Vein: No evidence of thrombus. Normal compressibility, respiratory phasicity and response to augmentation. Popliteal Vein: No evidence of thrombus. Normal compressibility, respiratory phasicity and response to augmentation. Calf Veins: No evidence of thrombus. Normal compressibility and flow on color Doppler imaging. Superficial Great Saphenous Vein: No evidence of thrombus. Normal compressibility and flow on color Doppler imaging Other Findings:  Edema IMPRESSION: Sonographic survey of the bilateral lower extremities negative for DVT. Bilateral lower extremity edema Electronically Signed   By: Corrie Mckusick D.O.   On: 02/25/2018 16:36   Called 11/13 to check on how he is doing.  LMOM- let me know if not doing better He has an appt with Dr. Johnsie Cancel tomorrow  Mail copy  of his labs to him

## 2018-02-26 ENCOUNTER — Telehealth: Payer: Self-pay

## 2018-02-26 ENCOUNTER — Telehealth: Payer: Self-pay | Admitting: *Deleted

## 2018-02-26 NOTE — Telephone Encounter (Signed)
Left message for patient to call back  

## 2018-02-26 NOTE — Progress Notes (Signed)
Cardiology Office Note    Date:  02/28/2018   ID:  Messiyah, Waterson May 18, 1945, MRN [id number]  PCP:  Colon Branch, MD  Cardiologist:  Dr.Nalayah Hitt   Chief Complaint: Dyspnea  History of Present Illness:   72 y.o. remote stenting of LAD in [name] . History of DVT, HTN, HLD and CRF Chronic venous Disease with edema and right sided ulcers in past. Same dyspnea 01/2017 BNP and d dimer up V/Q negative started on lasix with improvement HTN and PVCls improved with beta blocker  Echo reviewed 02/01/17 EF 03% grade 2 diastolic mild MR  ETT 00/9/23 normal   04/30/17 Right TKR Alusio no cardiac complications  Concerned about progressive neuropathy in both upper extremities. No motor deficits Concerned he has cervical neck issue and can't see [name] until December  MRI 05/15/17 with severe disc and facet degeneration multilevel  Cervical decompression with [name] 06/27/17  Seen by Dr Edilia Bo 02/25/18 complained of dyspnea, wheezing and coughing for 2 weeks  Improved sitting up. Chronic edema felt to be a bit worse was supposed to be on lasix 3x/wee But not taking Weight was up about 10 lb since 09/18/17 DVT was in 2017 after back surgery and Has chronic LE edema with venous insufficiency [place] 02/25/18 no DVT bilaterally D dimer was 2.93  BNP 697 K 4.9 and Cr actually lower than usual 1.29  CTA no PE atelectasis and small bilateral pleural Effusions  Weight already down 5 lbs since on bid lasix. Did not use inhaler wheezing less Finished with Antibiotics   Past Medical History:  Diagnosis Date  . Arthritis   . CAD (coronary artery disease)   . Chronic kidney disease   . DVT (deep venous thrombosis) ([place])    following spinal surgery Nov 2017  . HTN (hypertension)   . MI (myocardial infarction) ([place])    1999 ; stents placed   . Numbness and tingling in both hands   . Spondylosis of cervical spine     Past Surgical History:  Procedure Laterality Date  . ANTERIOR  CERVICAL DECOMP/DISCECTOMY FUSION N/A 06/27/2017   Procedure: ANTERIOR CERVICAL DECOMPRESSION/DISCECTOMY FUSION C3-4;  Surgeon: [name], MD;  Location: [facility];  Service: Orthopedics;  Laterality: N/A;  3 hrs  . ANTERIOR LAT LUMBAR FUSION N/A 03/01/2016   Procedure: EXTREME LATERAL INTERBODY FUSION  LUMBAR 2-4;  Surgeon: [name], MD;  Location: [facility];  Service: Orthopedics;  Laterality: N/A;  . CARDIAC CATHETERIZATION     20 yrs ago  . JOINT REPLACEMENT    . RADIOLOGY WITH ANESTHESIA N/A 05/15/2017   Procedure: MRI WITH ANESTHESIA CERVICAL SPINE WITH AND WITHOUT;  Surgeon: Radiologist, Medication, MD;  Location: [facility];  Service: Radiology;  Laterality: N/A;  . SPINAL FUSION N/A 03/02/2016   Procedure: POSTERIOR SPINAL FUSION INTERBODY L2-S1, DECOMPRESSION L4-S1;  Surgeon: [name], MD;  Location: [facility];  Service: Orthopedics;  Laterality: N/A;  . TOTAL KNEE ARTHROPLASTY Right 04/30/2017   Procedure: RIGHT TOTAL KNEE ARTHROPLASTY;  Surgeon: [name], MD;  Location[facility];  Service: Orthopedics;  Laterality: Right;    Current Medications: Prior to Admission medications   Medication Sig Start Date End Date Taking? Authorizing Provider  Ascorbic Acid (VITAMIN C) 1000 MG tablet Take 1,000 mg by mouth daily.    [provider]  aspirin EC 81 MG tablet Take 81 mg by mouth daily.    [provider]  furosemide (LASIX) 20 MG tablet Take 1 tablet (  20 mg total) by mouth daily. 01/25/17   Colon Branch, MD  losartan (COZAAR) 100 MG tablet Take 1 tablet (100 mg total) by mouth daily. 01/05/17   [name], MD  Multiple Vitamin (MULTIVITAMIN WITH MINERALS) TABS tablet Take 1 tablet by mouth daily.    [provider]  rosuvastatin (CRESTOR) 20 MG tablet Take 1 tablet (20 mg total) by mouth at bedtime. February 17, 2017   Colon Branch, MD    Allergies:   Patient has no known allergies.   Social History   Socioeconomic History  . Marital status: Married    Spouse  name: Not on file  . Number of children: 2  . Years of education: Not on file  . Highest education level: Not on file  Occupational History  . Occupation: retired , Research officer, political party   Social Needs  . Financial resource strain: Not on file  . Food insecurity:    Worry: Not on file    Inability: Not on file  . Transportation needs:    Medical: Not on file    Non-medical: Not on file  Tobacco Use  . Smoking status: Former Smoker    Types: Cigarettes    Last attempt to quit: 04/26/1983    Years since quitting: 34.8  . Smokeless tobacco: Never Used  . Tobacco comment: quit over 40 years ago   Substance and Sexual Activity  . Alcohol use: No  . Drug use: No  . Sexual activity: Never  Lifestyle  . Physical activity:    Days per week: Not on file    Minutes per session: Not on file  . Stress: Not on file  Relationships  . Social connections:    Talks on phone: Not on file    Gets together: Not on file    Attends religious service: Not on file    Active member of club or organization: Not on file    Attends meetings of clubs or organizations: Not on file    Relationship status: Not on file  Other Topics Concern  . Not on file  Social History Narrative   Household pt, wife, a Pharmacologist from [place]      Family History:  The patient's family history includes Cancer - Other in his mother; Heart attack in his father; Uterine cancer in his sister.  ROS:   Please see the history of present illness.    ROS All other systems reviewed and are negative.   PHYSICAL EXAM:   VS:  BP (!) 150/88   Pulse 64   Ht 5' 8.5" (1.74 m)   Wt 253 lb 4 oz (114.9 kg) Comment: 246 lbs at home pt reports  SpO2 94%   BMI 37.95 kg/m    Affect appropriate Overweight white male  HEENT: normal Neck supple with no adenopathy JVP normal no bruits no thyromegaly Lungs decreased BS bilateral bases  Heart:  S1/S2 SEM  murmur, no rub, gallop or click PMI normal Abdomen: benighn, BS positve, no  tenderness, no AAA no bruit.  No HSM or HJR Distal pulses intact with no bruits Plus 2 RLE edema chronic  Neuro non-focal Skin warm and dry No muscular weakness Post right TKR    Wt Readings from Last 3 Encounters:  02/28/18 253 lb 4 oz (114.9 kg)  02/25/18 256 lb (116.1 kg)  09/18/17 246 lb 8 oz (111.8 kg)      Studies/Labs Reviewed:   EKG:    Feb 17, 2017  SR rate 71 normal PVC nonspecific ST changes   Recent Labs: 01/15/2018: ALT 17 02/25/2018: BUN 21; Creatinine, Ser 1.29; Hemoglobin 14.0; Platelets 197.0; Potassium 4.9; Pro B Natriuretic peptide (BNP) 697.0; Sodium 141   Lipid Panel    Component Value Date/Time   CHOL 166 01/15/2018 1516   TRIG 189 (H) 01/15/2018 1516   HDL 42 01/15/2018 1516   CHOLHDL 4.0 01/15/2018 1516   CHOLHDL 4 06/24/2013 1113   VLDL 16.2 06/24/2013 1113   LDLCALC 86 01/15/2018 1516    Additional studies/ records that were reviewed today include:  AS above    ASSESSMENT & PLAN:     1. CAD s/p remote LAD stenting -  No chest pain. Continue ASA and statin. ETT 02/23/17 non ischemic continue ASA and beta blocker   2. HTN- Well controlled.  Continue current medications and low sodium Dash type diet.    3. HLD-  Continue statin labs with primary   4. CKD stage III  Baseline Cr 1.5 stable   5. Ortho:  Post right TKR continue PT/OT     6. 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 Diastolic CHF:  Discussed needing to take lasix daily despite urinary frequency will update TTE from 02/01/17 To make sure EF still low normal history of grade 2 diastolic dysfunction continue bid lasix BMET /BNP in 2 weeks Needs to exercise  And eat much better as well   Jenkins Rouge

## 2018-02-26 NOTE — Telephone Encounter (Signed)
-----   Message from [name], MD sent at 02/25/2018  6:12 PM EST ----- Pam can you try to get him in with PA this week  ----- Message ----- From: Darreld Mclean, MD Sent: 02/25/2018   5:49 PM EST To: [name], MD  Hi Laurey Arrow- I saw Gregory Guerrero today with SOB, orhopnea and about 10 lbs weight gain. I did get a D dimer and leg [place] due to positive D dimer, but it looks like CHF most likely. His BNP is about 700   He has not been using lasix so I am having him go on 40 mg a day for the time being.  Would your office be able to see him later this week?  Let me know if not possible and in that case I will have him re-checked here  Thanks so much Jess Copland

## 2018-02-26 NOTE — Telephone Encounter (Signed)
Received Abnormal Lab Report D-dimer 2.93H results from [facility]; forwarded to referring provider/SLS

## 2018-02-26 NOTE — Telephone Encounter (Signed)
Dr. Johnsie Cancel had an opening on Thursday. Made patient an appointment to see Dr. Johnsie Cancel and discuss his symptoms.

## 2018-02-27 ENCOUNTER — Telehealth: Payer: Self-pay

## 2018-02-27 NOTE — Telephone Encounter (Signed)
Called and spoke with wife - Toft down 3 lbs and feeling a bit better. Seeing [facility] tomorrow

## 2018-02-27 NOTE — Telephone Encounter (Signed)
Copied from [place] [phone]. Topic: General - Inquiry >> Feb 27, 2018 10:36 AM Conception Chancy, NT wrote: Reason for CRM: patient states he received a call from Dr. Lorelei Pont and he is returning her call. He can be reached at the number provided below.   [phone]

## 2018-02-28 ENCOUNTER — Encounter: Payer: Self-pay | Admitting: Cardiovascular Disease

## 2018-02-28 ENCOUNTER — Ambulatory Visit (INDEPENDENT_AMBULATORY_CARE_PROVIDER_SITE_OTHER): Payer: Medicare Other | Admitting: Cardiovascular Disease

## 2018-02-28 VITALS — BP 150/88 | HR 64 | Ht 68.5 in | Wt 253.2 lb

## 2018-02-28 DIAGNOSIS — I2510 Atherosclerotic heart disease of native coronary artery without angina pectoris: Secondary | ICD-10-CM

## 2018-02-28 DIAGNOSIS — R0602 Shortness of breath: Secondary | ICD-10-CM | POA: Diagnosis not present

## 2018-02-28 DIAGNOSIS — E782 Mixed hyperlipidemia: Secondary | ICD-10-CM | POA: Diagnosis not present

## 2018-02-28 DIAGNOSIS — I10 Essential (primary) hypertension: Secondary | ICD-10-CM

## 2018-02-28 NOTE — Patient Instructions (Addendum)
Medication Instructions:   If you need a refill on your cardiac medications before your next appointment, please call your pharmacy.   Lab work: Your physician recommends that you have lab work in 2 weeks for BMET and BNP.  If you have labs (blood work) drawn today and your tests are completely normal, you will receive your results only by: Marland Kitchen MyChart Message (if you have MyChart) OR . A paper copy in the mail If you have any lab test that is abnormal or we need to change your treatment, we will call you to review the results.  Testing/Procedures: Your physician has requested that you have an echocardiogram. Echocardiography is a painless test that uses sound waves to create images of your heart. It provides your doctor with information about the size and shape of your heart and how well your heart's chambers and valves are working. This procedure takes approximately one hour. There are no restrictions for this procedure.    Follow-Up: At [facility], you and your health needs are our priority.  As part of our continuing mission to provide you with exceptional heart care, we have created designated Provider Care Teams.  These Care Teams include your primary Cardiologist (physician) and Advanced Practice Providers (APPs -  Physician Assistants and Nurse Practitioners) who all work together to provide you with the care you need, when you need it.

## 2018-03-05 ENCOUNTER — Ambulatory Visit (HOSPITAL_COMMUNITY): Payer: Medicare Other | Attending: Cardiology

## 2018-03-05 ENCOUNTER — Other Ambulatory Visit: Payer: Self-pay

## 2018-03-05 DIAGNOSIS — I2510 Atherosclerotic heart disease of native coronary artery without angina pectoris: Secondary | ICD-10-CM | POA: Diagnosis not present

## 2018-03-05 DIAGNOSIS — E782 Mixed hyperlipidemia: Secondary | ICD-10-CM | POA: Diagnosis not present

## 2018-03-05 DIAGNOSIS — R0602 Shortness of breath: Secondary | ICD-10-CM | POA: Diagnosis not present

## 2018-03-05 DIAGNOSIS — I10 Essential (primary) hypertension: Secondary | ICD-10-CM | POA: Diagnosis not present

## 2018-03-12 ENCOUNTER — Other Ambulatory Visit: Payer: Medicare Other | Admitting: *Deleted

## 2018-03-12 DIAGNOSIS — R0602 Shortness of breath: Secondary | ICD-10-CM | POA: Diagnosis not present

## 2018-03-12 DIAGNOSIS — I2510 Atherosclerotic heart disease of native coronary artery without angina pectoris: Secondary | ICD-10-CM | POA: Diagnosis not present

## 2018-03-12 DIAGNOSIS — I10 Essential (primary) hypertension: Secondary | ICD-10-CM | POA: Diagnosis not present

## 2018-03-12 DIAGNOSIS — E782 Mixed hyperlipidemia: Secondary | ICD-10-CM | POA: Diagnosis not present

## 2018-03-13 ENCOUNTER — Telehealth: Payer: Self-pay

## 2018-03-13 DIAGNOSIS — R7989 Other specified abnormal findings of blood chemistry: Secondary | ICD-10-CM

## 2018-03-13 LAB — BASIC METABOLIC PANEL
BUN / CREAT RATIO: 24 (ref 10–24)
BUN: 35 mg/dL — AB (ref 8–27)
CO2: 25 mmol/L (ref 20–29)
Calcium: 9.6 mg/dL (ref 8.6–10.2)
Chloride: 108 mmol/L — ABNORMAL HIGH (ref 96–106)
Creatinine, Ser: 1.47 mg/dL — ABNORMAL HIGH (ref 0.76–1.27)
GFR, EST AFRICAN AMERICAN: 54 mL/min/{1.73_m2} — AB (ref >59)
GFR, EST NON AFRICAN AMERICAN: 46 mL/min/{1.73_m2} — AB (ref >59)
Glucose: 81 mg/dL (ref 65–99)
Potassium: 5.1 mmol/L (ref 3.5–5.2)
Sodium: 142 mmol/L (ref 134–144)

## 2018-03-13 LAB — PRO B NATRIURETIC PEPTIDE: NT-Pro BNP: 5093 pg/mL — ABNORMAL HIGH (ref 0–376)

## 2018-03-13 NOTE — Telephone Encounter (Signed)
Had DOD, Dr. Angelena Form reviewed lab work and patient's last office visit. As long as patient feels okay, he recommended for patient to increase his Lasix 40 mg BID for 3 days then back to 20 mg BID, and have patient come in next week for repeat BMET. Called patient about recommendations. Patient stated he is feeling fine. Patient will increase lasix and come in on 03/20/18 for repeat lab work.

## 2018-03-18 ENCOUNTER — Telehealth: Payer: Self-pay

## 2018-03-18 ENCOUNTER — Telehealth: Payer: Self-pay | Admitting: Cardiovascular Disease

## 2018-03-18 DIAGNOSIS — R7989 Other specified abnormal findings of blood chemistry: Secondary | ICD-10-CM

## 2018-03-18 DIAGNOSIS — Z79899 Other long term (current) drug therapy: Secondary | ICD-10-CM

## 2018-03-18 MED ORDER — FUROSEMIDE 40 MG PO TABS: 40.0000 mg | ORAL_TABLET | Freq: Two times a day (BID) | ORAL | 3 refills | Status: DC

## 2018-03-18 NOTE — Telephone Encounter (Signed)
-----   Message from [name], MD sent at 03/18/2018  8:32 AM EST ----- Continue higher dose lasix

## 2018-03-18 NOTE — Telephone Encounter (Signed)
Outpatient Medication Detail    Disp Refills Start End   furosemide (LASIX) 40 MG tablet 180 tablet 3 03/18/2018    Sig - Route: Take 1 tablet (40 mg total) by mouth 2 (two) times daily. - Oral   Sent to pharmacy as: furosemide (LASIX) 40 MG tablet   E-Prescribing Status: Receipt confirmed by pharmacy (03/18/2018 10:04 AM EST)   Pharmacy   [facility], [place] - [place]

## 2018-03-18 NOTE — Telephone Encounter (Signed)
°*  STAT* If patient is at the pharmacy, call can be transferred to refill team.   1. Which medications need to be refilled? (please list name of each medication and dose if known) Furosemide 40mg   2. Which pharmacy/location (including street and city if local pharmacy) is medication to be sent to?[facility] in [place]   3. Do they need a 30 day or 90 day supply? Says it doesn't matter   Only has three pills left

## 2018-03-18 NOTE — Telephone Encounter (Signed)
Called patient to inform him to take lasix 40 mg BID. Patient wanted to get his lab work done at [facility] closer to his home. Ordered BMET for lab collect. Patient will have done this week.

## 2018-03-19 ENCOUNTER — Other Ambulatory Visit: Payer: Self-pay | Admitting: Cardiovascular Disease

## 2018-03-19 MED ORDER — METOPROLOL TARTRATE 25 MG PO TABS: 25.0000 mg | ORAL_TABLET | Freq: Two times a day (BID) | ORAL | 3 refills | Status: DC

## 2018-03-20 ENCOUNTER — Other Ambulatory Visit: Payer: Medicare Other

## 2018-03-20 DIAGNOSIS — R7989 Other specified abnormal findings of blood chemistry: Secondary | ICD-10-CM | POA: Diagnosis not present

## 2018-03-20 DIAGNOSIS — Z79899 Other long term (current) drug therapy: Secondary | ICD-10-CM | POA: Diagnosis not present

## 2018-03-21 LAB — BASIC METABOLIC PANEL
BUN/Creatinine Ratio: 23 (ref 10–24)
BUN: 40 mg/dL — AB (ref 8–27)
CALCIUM: 9.8 mg/dL (ref 8.6–10.2)
CO2: 27 mmol/L (ref 20–29)
Chloride: 103 mmol/L (ref 96–106)
Creatinine, Ser: 1.72 mg/dL — ABNORMAL HIGH (ref 0.76–1.27)
GFR, EST AFRICAN AMERICAN: 44 mL/min/{1.73_m2} — AB (ref >59)
GFR, EST NON AFRICAN AMERICAN: 38 mL/min/{1.73_m2} — AB (ref >59)
Glucose: 82 mg/dL (ref 65–99)
POTASSIUM: 5 mmol/L (ref 3.5–5.2)
Sodium: 144 mmol/L (ref 134–144)

## 2018-03-22 ENCOUNTER — Telehealth: Payer: Self-pay

## 2018-03-22 DIAGNOSIS — Z79899 Other long term (current) drug therapy: Secondary | ICD-10-CM

## 2018-03-22 DIAGNOSIS — E785 Hyperlipidemia, unspecified: Secondary | ICD-10-CM

## 2018-03-22 DIAGNOSIS — R609 Edema, unspecified: Secondary | ICD-10-CM

## 2018-03-22 MED ORDER — FUROSEMIDE 40 MG PO TABS: ORAL_TABLET | ORAL | 3 refills | Status: DC

## 2018-03-22 NOTE — Telephone Encounter (Signed)
Notes recorded by Michaelyn Barter, RN on 03/22/2018 at 10:09 AM EST Patient aware of results. Per Dr. Johnsie Cancel, Liitle more dehydrated on higher dose lasix but continue if he still has edema F/U BMET BNP in 3 weeks. Patient stated his edema is better, but still have some on the left leg. Dr. Johnsie Cancel recommend patient taking the higher dose of lasix every other day and go back on the lower dose on the other days. Patient will repeat lab work in 4 weeks. Patient verbalized understanding.

## 2018-03-22 NOTE — Telephone Encounter (Signed)
-----   Message from [name], MD sent at 03/21/2018  8:01 AM EST ----- Liitle more dehydrated on higher dose lasix but continue if he still has edema F/U BMET BNP in 3 weeks

## 2018-03-27 NOTE — Progress Notes (Signed)
Cardiology Office Note    Date:  03/28/2018   ID:  Roper, Tolson [birth year], MRN [id number]  PCP:  Colon Branch, MD  Cardiologist:  Dr.Maleya Leever   Chief Complaint: Dyspnea  History of Present Illness:   72 y.o. remote stenting of LAD in [name] . History of DVT, HTN, HLD and CRF Chronic venous Disease with edema and right sided ulcers in past. Same dyspnea 01/2017 BNP and d dimer up V/Q negative started on lasix with improvement HTN and PVCls improved with beta blocker  Echo reviewed 02/01/17 EF 25% grade 2 diastolic mild MR  ETT 08/18/95 normal   04/30/17 Right TKR Alusio no cardiac complications  Concerned about progressive neuropathy in both upper extremities. No motor deficits Concerned he has cervical neck issue and can't see [name] until December  MRI 05/15/17 with severe disc and facet degeneration multilevel  Cervical decompression with [name] 06/27/17  Seen by Dr Edilia Bo 02/25/18 complained of dyspnea, wheezing and coughing for 2 weeks  Improved sitting up. Chronic edema felt to be a bit worse was supposed to be on lasix 3x/wee But not taking Weight was up about 10 lb since 09/18/17 DVT was in 2017 after back surgery and Has chronic LE edema with venous insufficiency [place] 02/25/18 no DVT bilaterally D dimer was 2.93  BNP 697 K 4.9 and Cr actually lower than usual 1.29  CTA no PE atelectasis and small bilateral pleural Effusions  Improved with Rx Bid Lasix BNP was still elevated 5093 03/12/18 with BUN 35 and Cr 1.47 TTE updated EF 55-60% mild MR severe bi atrial enlargement   Back to baseline doing well   Past Medical History:  Diagnosis Date  . Arthritis   . CAD (coronary artery disease)   . Chronic kidney disease   . DVT (deep venous thrombosis) ([place])    following spinal surgery Nov 2017  . HTN (hypertension)   . MI (myocardial infarction) ([place])    1999 ; stents placed   . Numbness and tingling in both hands   . Spondylosis of cervical spine      Past Surgical History:  Procedure Laterality Date  . ANTERIOR CERVICAL DECOMP/DISCECTOMY FUSION N/A 06/27/2017   Procedure: ANTERIOR CERVICAL DECOMPRESSION/DISCECTOMY FUSION C3-4;  Surgeon: [name], MD;  Location: [facility];  Service: Orthopedics;  Laterality: N/A;  3 hrs  . ANTERIOR LAT LUMBAR FUSION N/A 03/01/2016   Procedure: EXTREME LATERAL INTERBODY FUSION  LUMBAR 2-4;  Surgeon: [name], MD;  Location: [facility];  Service: Orthopedics;  Laterality: N/A;  . CARDIAC CATHETERIZATION     20 yrs ago  . JOINT REPLACEMENT    . RADIOLOGY WITH ANESTHESIA N/A 05/15/2017   Procedure: MRI WITH ANESTHESIA CERVICAL SPINE WITH AND WITHOUT;  Surgeon: Radiologist, Medication, MD;  Location: [facility];  Service: Radiology;  Laterality: N/A;  . SPINAL FUSION N/A 03/02/2016   Procedure: POSTERIOR SPINAL FUSION INTERBODY L2-S1, DECOMPRESSION L4-S1;  Surgeon: [name], MD;  Location: [facility];  Service: Orthopedics;  Laterality: N/A;  . TOTAL KNEE ARTHROPLASTY Right 04/30/2017   Procedure: RIGHT TOTAL KNEE ARTHROPLASTY;  Surgeon: [name], MD;  Location: [facility];  Service: Orthopedics;  Laterality: Right;    Current Medications: Prior to Admission medications   Medication Sig Start Date End Date Taking? Authorizing Provider  Ascorbic Acid (VITAMIN C) 1000 MG tablet Take 1,000 mg by mouth daily.    [provider]  aspirin EC 81 MG tablet Take 81 mg by mouth  daily.    [provider]  furosemide (LASIX) 20 MG tablet Take 1 tablet (20 mg total) by mouth daily. 01/25/17   Colon Branch, MD  losartan (COZAAR) 100 MG tablet Take 1 tablet (100 mg total) by mouth daily. 01/05/17   [name], MD  Multiple Vitamin (MULTIVITAMIN WITH MINERALS) TABS tablet Take 1 tablet by mouth daily.    [provider]  rosuvastatin (CRESTOR) 20 MG tablet Take 1 tablet (20 mg total) by mouth at bedtime. 01/24/17   Colon Branch, MD    Allergies:   Patient has no known allergies.   Social  History   Socioeconomic History  . Marital status: Married    Spouse name: Not on file  . Number of children: 2  . Years of education: Not on file  . Highest education level: Not on file  Occupational History  . Occupation: retired , Research officer, political party   Social Needs  . Financial resource strain: Not on file  . Food insecurity:    Worry: Not on file    Inability: Not on file  . Transportation needs:    Medical: Not on file    Non-medical: Not on file  Tobacco Use  . Smoking status: Former Smoker    Types: Cigarettes    Last attempt to quit: 04/26/1983    Years since quitting: 34.9  . Smokeless tobacco: Never Used  . Tobacco comment: quit over 40 years ago   Substance and Sexual Activity  . Alcohol use: No  . Drug use: No  . Sexual activity: Never  Lifestyle  . Physical activity:    Days per week: Not on file    Minutes per session: Not on file  . Stress: Not on file  Relationships  . Social connections:    Talks on phone: Not on file    Gets together: Not on file    Attends religious service: Not on file    Active member of club or organization: Not on file    Attends meetings of clubs or organizations: Not on file    Relationship status: Not on file  Other Topics Concern  . Not on file  Social History Narrative   Household pt, wife, a Pharmacologist from [place]      Family History:  The patient's family history includes Cancer - Other in his mother; Heart attack in his father; Uterine cancer in his sister.  ROS:   Please see the history of present illness.    ROS All other systems reviewed and are negative.   PHYSICAL EXAM:   VS:  BP (!) 155/65   Pulse (!) 48   Ht 5' 8.75" (1.746 m)   Wt 250 lb (113.4 kg)   BMI 37.19 kg/m    Affect appropriate Overweight white male  HEENT: normal Neck supple with no adenopathy JVP normal no bruits no thyromegaly Lungs decreased BS bilateral bases  Heart:  S1/S2 SEM  murmur, no rub, gallop or click PMI normal Abdomen:  benighn, BS positve, no tenderness, no AAA no bruit.  No HSM or HJR Distal pulses intact with no bruits Plus 1 RLE edema chronic improved  Neuro non-focal Skin warm and dry No muscular weakness Post right TKR    Wt Readings from Last 3 Encounters:  03/28/18 250 lb (113.4 kg)  02/28/18 253 lb 4 oz (114.9 kg)  02/25/18 256 lb (116.1 kg)      Studies/Labs Reviewed:   EKG:  01/24/17 SR rate 71 normal PVC nonspecific ST changes   Recent Labs: 01/15/2018: ALT 17 02/25/2018: Hemoglobin 14.0; Platelets 197.0 03/12/2018: NT-Pro BNP 5,093 03/20/2018: BUN 40; Creatinine, Ser 1.72; Potassium 5.0; Sodium 144   Lipid Panel    Component Value Date/Time   CHOL 166 01/15/2018 1516   TRIG 189 (H) 01/15/2018 1516   HDL 42 01/15/2018 1516   CHOLHDL 4.0 01/15/2018 1516   CHOLHDL 4 06/24/2013 1113   VLDL 16.2 06/24/2013 1113   LDLCALC 86 01/15/2018 1516    Additional studies/ records that were reviewed today include:  AS above    ASSESSMENT & PLAN:     1. CAD s/p remote LAD stenting -  No chest pain. Continue ASA and statin. ETT 02/23/17 non ischemic continue ASA and beta blocker   2. HTN- Well controlled.  Continue current medications and low sodium Dash type diet.    3. HLD-  Continue statin labs with primary   4. CKD stage III  Baseline Cr 1.5 stable   5. Ortho:  Post right TKR continue PT/OT     6. 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 Diastolic CHF:  Discussed needing to take lasix bid daily despite urinary frequency  Updated TTE shows continued normal EF and no really bad valve disease   Needs to exercise  And eat much better as well   Jenkins Rouge

## 2018-03-28 ENCOUNTER — Ambulatory Visit (INDEPENDENT_AMBULATORY_CARE_PROVIDER_SITE_OTHER): Payer: Medicare Other | Admitting: Cardiovascular Disease

## 2018-03-28 VITALS — BP 155/65 | HR 48 | Ht 68.75 in | Wt 250.0 lb

## 2018-03-28 DIAGNOSIS — I2510 Atherosclerotic heart disease of native coronary artery without angina pectoris: Secondary | ICD-10-CM | POA: Diagnosis not present

## 2018-03-28 DIAGNOSIS — E782 Mixed hyperlipidemia: Secondary | ICD-10-CM | POA: Diagnosis not present

## 2018-03-28 DIAGNOSIS — I10 Essential (primary) hypertension: Secondary | ICD-10-CM | POA: Diagnosis not present

## 2018-03-28 DIAGNOSIS — I5032 Chronic diastolic (congestive) heart failure: Secondary | ICD-10-CM

## 2018-03-28 NOTE — Patient Instructions (Signed)

## 2018-04-08 ENCOUNTER — Other Ambulatory Visit: Payer: Medicare Other

## 2018-04-08 DIAGNOSIS — M5031 Other cervical disc degeneration,  high cervical region: Secondary | ICD-10-CM | POA: Diagnosis not present

## 2018-05-16 DIAGNOSIS — G959 Disease of spinal cord, unspecified: Secondary | ICD-10-CM | POA: Diagnosis not present

## 2018-06-06 DIAGNOSIS — G959 Disease of spinal cord, unspecified: Secondary | ICD-10-CM | POA: Diagnosis not present

## 2018-06-06 DIAGNOSIS — M4712 Other spondylosis with myelopathy, cervical region: Secondary | ICD-10-CM | POA: Diagnosis not present

## 2018-06-06 DIAGNOSIS — M4802 Spinal stenosis, cervical region: Secondary | ICD-10-CM | POA: Diagnosis not present

## 2018-06-06 DIAGNOSIS — M47814 Spondylosis without myelopathy or radiculopathy, thoracic region: Secondary | ICD-10-CM | POA: Diagnosis not present

## 2018-06-18 ENCOUNTER — Telehealth: Payer: Self-pay | Admitting: Cardiovascular Disease

## 2018-06-18 NOTE — Telephone Encounter (Signed)
New Message:     Pt's wife called and said pt is scheduled for back surgery on 07-24-18. Her question is  Does he need to see Dr Johnsie Cancel for Medical Clearance?

## 2018-06-18 NOTE — Telephone Encounter (Signed)
I s/w pt's wife in regards to her call asking if pt is going to need to see Dr. Johnsie Cancel before pt has back surgery. I explained to her that I need to have the surgeon's office fax over clearance form with type of surgery, any medication recommendations, anesthesia. I asked her who the surgeon is and I will call and get the information I need and will have Pre Op provider review. I explained if pt does not need cardiac clearance then we will send clearance to surgeon. If pt needs appt we will call them and schedule appt. Pt's wife thanked me for the call. Surgeon is Dr. Aris Everts with [facility].   I called over to Dr. Prince Rome office [phone] and left message for surgery scheduler to please fax over clearance form with type of surgery, anesthesia, meds to be held and how long. Left message to please fax clearance form to [phone].

## 2018-06-18 NOTE — Telephone Encounter (Signed)
I called Dr. Aris Everts office again to see if they have yet faxed over the clearance form for the pt. I was informed message was given to Dr. Prince Rome surgery scheduler to fax over a clearance form. Surgery scheduler should be sending form over in the next day or so. Advised we will need information, (meds to be held if any and how long, type of surgery, type of anesthesia). I thanked her for her help and will keep an eye out for the form. Gave my phone number [phone] if any questions. I did state I am not in the office tomorrow though they may still call and someone else can take the information.

## 2018-07-23 ENCOUNTER — Other Ambulatory Visit: Payer: Self-pay

## 2018-07-23 MED ORDER — FUROSEMIDE 40 MG PO TABS: ORAL_TABLET | ORAL | 1 refills | Status: DC

## 2018-08-05 ENCOUNTER — Telehealth: Payer: Self-pay

## 2018-08-05 NOTE — Telephone Encounter (Signed)
Scheduled VOV for 08-06-2018 at 11:20. Done

## 2018-08-05 NOTE — Telephone Encounter (Signed)
Due for follow-up, virtual visit?

## 2018-08-06 ENCOUNTER — Ambulatory Visit: Payer: Medicare Other | Admitting: Internal Medicine

## 2018-08-06 ENCOUNTER — Other Ambulatory Visit: Payer: Self-pay

## 2018-08-06 DIAGNOSIS — I2510 Atherosclerotic heart disease of native coronary artery without angina pectoris: Secondary | ICD-10-CM

## 2018-08-06 NOTE — Progress Notes (Signed)
   Subjective:    Patient ID: Gregory Guerrero, male    DOB: 07-14-1945, 73 y.o.   MRN: [id number]  DOS:  08/06/2018 Type of visit - description:  We sent doxy.me invitations x3: No answer.

## 2018-10-21 ENCOUNTER — Other Ambulatory Visit: Payer: Self-pay | Admitting: Cardiovascular Disease

## 2018-10-23 MED ORDER — ATORVASTATIN CALCIUM 80 MG PO TABS: ORAL_TABLET | ORAL | 1 refills | Status: DC

## 2018-10-23 NOTE — Addendum Note (Signed)
Addended by: Derl Barrow on: 10/23/2018 02:39 PM   Modules accepted: Orders

## 2018-11-15 ENCOUNTER — Other Ambulatory Visit: Payer: Self-pay

## 2018-11-15 DIAGNOSIS — M5020 Other cervical disc displacement, unspecified cervical region: Secondary | ICD-10-CM | POA: Diagnosis not present

## 2018-11-15 DIAGNOSIS — M5030 Other cervical disc degeneration, unspecified cervical region: Secondary | ICD-10-CM | POA: Diagnosis not present

## 2018-11-18 ENCOUNTER — Telehealth: Payer: Self-pay | Admitting: *Deleted

## 2018-11-18 NOTE — Telephone Encounter (Signed)
Pt has appt with Tonny Branch on 11/27/18 and clearance will be decided at that time.

## 2018-11-18 NOTE — Telephone Encounter (Signed)
   [facility] Pre-operative Risk Assessment    Request for surgical clearance:  1. What type of surgery is being performed? ANTERIOR CERVICAL DISCECTOMY AND ALLOGRAFT C3-C6   2. When is this surgery scheduled? TBD  3. What type of clearance is required (medical clearance vs. Pharmacy clearance to hold med vs. Both)? MEDICAL  4. Are there any medications that need to be held prior to surgery and how long? ASA 7 DAYS PRIOR  5. Practice name and name of physician performing surgery? [facility]; DR. Legrand Como HAGLUND  6. What is your office phone number [phone]   7.   What is your office fax number [phone]  8.   Anesthesia type (None, local, MAC, general) ? CHOICE? NONE LISTED    Gregory Guerrero 11/18/2018, 4:30 PM  _________________________________________________________________   (provider comments below)

## 2018-11-24 NOTE — Progress Notes (Signed)
Cardiology Office Note   Date:  11/27/2018   ID:  Gregory Guerrero, DOB 10/03/1945, MRN [id number]  PCP:  Colon Branch, MD  Cardiologist: Dr. Johnsie Cancel, MD   Chief Complaint  Patient presents with  . Pre-op Exam    History of Present Illness: Gregory Guerrero is a 73 y.o. male with a history of CAD and remote stenting of LAD while living in [place], history of DVT, HTN, HLD, CRF and chronic venous disease with history of edema and right-sided ulcers who presents for cardiac clearance for anterior cervical discectomy and allograft placement of C3-C6   Gregory Guerrero has a history of dyspnea dating back to 2018 per chart review. He was seen by his PCP 01/24/2017 with complaints of shortness of breath in which his BNP and d-dimer were elevated.  There was no evidence of DVT on Doppler and PE on VQ scan.  He had improvement with initiation of Lasix.    He underwent an echocardiogram 02/01/2017 with an LVEF of 50% and grade 2 diastolic dysfunction, mild MR and severe biatrial enlargement.  Additionally, underwent an ETT 02/23/2017 with hypertensive response, PVCs and no ischemia noted. Given his hypertensive response, Lopressor 25 mg twice daily was added to his regimen. He was cleared for right TKR per Dr. Maureen Ralphs.  He did well with the surgery and was therefore cleared for spine surgery for severe disc degeneration with cervical spine decompression with Dr. [name] 06/27/2017 by Dr. Johnsie Cancel.  Unfortunately when he was seen on 09/18/2017 his symptoms had not much changed in his upper extremities and was placed on gabapentin.  He was last seen by Dr. Johnsie Cancel on 03/28/2018. At that time, he was noted to have recently been seen by his PCP 02/25/2018 with recurrent complaints of dyspnea, wheezing and coughing for approximately 2 weeks prior with worsening chronic lower extremity edema.  He was supposed to be taking Lasix 3 times per week however had not been taking and his weight was up approximately 10 pounds  since 09/18/2017.  Ultrasound performed 02/25/2018 with no DVT bilaterally.  D-dimer was 2.93 and BNP was 697.  CTA with no PE however with atelectasis and small bilateral pleural effusion.  His symptoms improved with Lasix twice daily.   Most recent Lasix Rx is 40 mg twice daily every other day andmg daily on alternating days. Last weight 03/28/2018 250lb>>> weight today 250lb. He is doing well from a cardiac perspective.  He denies anginal symptoms, palpitations, shortness of breath, PND, orthopnea, dizziness or syncope.  He reports his LE swelling is stable without fluctuation.  His weight has been stable as well.  He lives at home with his wife and performs ADLs/IADLs without complication.  He has stairs in his home however does not need to use them but feels that if he had to he would use them without problems.  He walks his dog multiple times a day without anginal symptoms or shortness of breath.  Most of his physical activity is limited by orthopedic issues.  According to the revised cardiac risk index he is at 6.6% risk of major cardiac event in the perioperative setting.  He performs 6.45 METS per [facility] activity status index score. Chart reviewed as part of pre-operative protocol coverage. Given past medical history and time since last visit, based on ACC/AHA guidelines, Gregory Guerrero would be at acceptable risk for the planned procedure without further cardiovascular testing.   I will route this recommendation to the  requesting party via [facility] fax function and remove from pre-op pool.   Past Medical History:  Diagnosis Date  . Arthritis   . CAD (coronary artery disease)   . Chronic kidney disease   . DVT (deep venous thrombosis) ([place])    following spinal surgery Nov 2017  . HTN (hypertension)   . MI (myocardial infarction) ([place])    1999 ; stents placed   . Numbness and tingling in both hands   . Spondylosis of cervical spine     Past Surgical History:  Procedure Laterality Date  .  ANTERIOR CERVICAL DECOMP/DISCECTOMY FUSION N/A 06/27/2017   Procedure: ANTERIOR CERVICAL DECOMPRESSION/DISCECTOMY FUSION C3-4;  Surgeon: [name], MD;  Location: [facility];  Service: Orthopedics;  Laterality: N/A;  3 hrs  . ANTERIOR LAT LUMBAR FUSION N/A 03/01/2016   Procedure: EXTREME LATERAL INTERBODY FUSION  LUMBAR 2-4;  Surgeon: [name], MD;  Location: [facility];  Service: Orthopedics;  Laterality: N/A;  . CARDIAC CATHETERIZATION     20 yrs ago  . JOINT REPLACEMENT    . RADIOLOGY WITH ANESTHESIA N/A 05/15/2017   Procedure: MRI WITH ANESTHESIA CERVICAL SPINE WITH AND WITHOUT;  Surgeon: Radiologist, Medication, MD;  Location: [facility];  Service: Radiology;  Laterality: N/A;  . SPINAL FUSION N/A 03/02/2016   Procedure: POSTERIOR SPINAL FUSION INTERBODY L2-S1, DECOMPRESSION L4-S1;  Surgeon: [name], MD;  Location: [facility];  Service: Orthopedics;  Laterality: N/A;  . TOTAL KNEE ARTHROPLASTY Right 04/30/2017   Procedure: RIGHT TOTAL KNEE ARTHROPLASTY;  Surgeon: [name], MD;  Location: [facility];  Service: Orthopedics;  Laterality: Right;     Current Outpatient Medications  Medication Sig Dispense Refill  . Ascorbic Acid (VITAMIN C) 1000 MG tablet Take 1,000 mg by mouth daily.    Marland Kitchen aspirin EC 81 MG tablet Take 81 mg by mouth daily.    Marland Kitchen atorvastatin (LIPITOR) 80 MG tablet TAKE 1 TABLET (80MG ) BY MOUTH EVERY OTHER DAY , TAKE 40 MG (1/2 TABLET) BY MOUTH ON ALTERNATE DAYS 90 tablet 1  . furosemide (LASIX) 40 MG tablet Take one tablet by mouth twice daily every other day, then take one tablet by mouth daily on alternating days. 180 tablet 1  . gabapentin (NEURONTIN) 300 MG capsule Take 300-600 mg by mouth 3 (three) times daily. 300-600 mg po tid prn for neuralgia and peripheral neuropathy    . losartan (COZAAR) 100 MG tablet TAKE 1 TABLET BY MOUTH ONCE DAILY 90 tablet 2  . Magnesium 400 MG CAPS Take by mouth.    . metoprolol tartrate (LOPRESSOR) 25 MG tablet Take 1 tablet (25 mg total) by  mouth 2 (two) times daily. 180 tablet 3  . Multiple Vitamin (MULTIVITAMIN) capsule Take 1 capsule by mouth daily.    Marland Kitchen UNABLE TO FIND Med Name: Turmeric 1500 mg, one daily     No current facility-administered medications for this visit.     Allergies:   Patient has no known allergies.    Social History:  The patient  reports that he quit smoking about 35 years ago. His smoking use included cigarettes. He has never used smokeless tobacco. He reports that he does not drink alcohol or use drugs.   Family History:  The patient's family history includes Cancer - Other in his mother; Heart attack in his father; Uterine cancer in his sister.    ROS:  Please see the history of present illness.  Otherwise, review of systems are positive for none.  All other systems are reviewed  and negative.    PHYSICAL EXAM: VS:  BP 138/84   Pulse 60   Ht 5\' 8"  (1.727 m)   Wt 250 lb 6.4 oz (113.6 kg)   SpO2 94%   BMI 38.07 kg/m  , BMI Body mass index is 38.07 kg/m.   General: Well developed, well nourished, NAD Neck: Negative for carotid bruits. No JVD Lungs:Clear to ausculation bilaterally. No wheezes, rales, or rhonchi. Breathing is unlabored. Cardiovascular: RRR with S1 S2. No murmurs, rubs, gallops, or LV heave appreciated. Abdomen: Soft, non-tender, non-distended. No obvious abdominal masses. MSK: Strength and tone appear normal for age. 5/5 in all extremities Extremities: No edema. No clubbing or cyanosis. DP/PT pulses 2+ bilaterally Neuro: Alert and oriented. No focal deficits. No facial asymmetry. MAE spontaneously. Psych: Responds to questions appropriately with normal affect.     EKG:  EKG is ordered today. The ekg ordered today demonstrates NSR with first-degree AV block and no acute ischemic changes.  HR 60 bpm   Recent Labs: 01/15/2018: ALT 17 02/25/2018: Hemoglobin 14.0; Platelets 197.0 03/12/2018: NT-Pro BNP 5,093 03/20/2018: BUN 40; Creatinine, Ser 1.72; Potassium 5.0; Sodium 144     Lipid Panel    Component Value Date/Time   CHOL 166 01/15/2018 1516   TRIG 189 (H) 01/15/2018 1516   HDL 42 01/15/2018 1516   CHOLHDL 4.0 01/15/2018 1516   CHOLHDL 4 06/24/2013 1113   VLDL 16.2 06/24/2013 1113   LDLCALC 86 01/15/2018 1516     Wt Readings from Last 3 Encounters:  11/27/18 250 lb 6.4 oz (113.6 kg)  03/28/18 250 lb (113.4 kg)  02/28/18 253 lb 4 oz (114.9 kg)     Other studies Reviewed: Additional studies/ records that were reviewed today include:   Echocardiogram 03/05/2018: Study Conclusions  - Left ventricle: The cavity size was normal. There was severe   focal basal and mild concentric hypertrophy. Systolic function   was normal. The estimated ejection fraction was in the range of   55% to 60%. Wall motion was normal; there were no regional wall   motion abnormalities. The study was not technically sufficient to   allow evaluation of LV diastolic dysfunction due to atrial   fibrillation. Doppler parameters are consistent with high   ventricular filling pressure. - Aortic valve: Trileaflet; normal thickness, mildly calcified   leaflets. - Mitral valve: Calcified annulus. There was mild regurgitation. - Left atrium: The atrium was severely dilated. - Right atrium: The atrium was severely dilated. - Tricuspid valve: There was trivial regurgitation. - Pulmonic valve: There was trivial regurgitation  Exercise tolerance test 02/23/2017: -Hypertensive response to exercise with no ST segment deviation -NSR, PVCs at baseline with hypertensive response -No ischemia -No NSVT  ASSESSMENT AND PLAN:  1.  Cardiac clearance for anterior cervical spine discectomy: -Gregory Guerrero denies anginal symptoms, palpitations, shortness of breath, PND, orthopnea, dizziness or syncope.  He reports his LE swelling is stable without fluctuation. His weight has been stable as well.  He lives at home with his wife and performs ADLs/IADLs without complication. He has stairs in his  home however does not need to use them but feels that if he had to he would use them without problems.  He walks his dog multiple times a day without anginal symptoms or shortness of breath.  Most of his physical activity is limited by orthopedic issues.  According to the revised cardiac risk index he is at 6.6% risk of major cardiac event in the perioperative setting.  He performs 6.45  METS per [facility] activity status index score. Chart reviewed as part of pre-operative protocol coverage. Given past medical history and time since last visit, based on ACC/AHA guidelines, Gregory Guerrero would be at acceptable risk for the planned procedure without further cardiovascular testing.   2.  History of CAD s/p remote LAD stenting: -Denies anginal symptoms -Continue ASA, statin, beta-blocker -Last ETT 02/23/2017 which was found to be nonischemic  3.  HLD: -Last LDL, 01/15/2018 -Continue statin -Labs per PCP   4.  CKD stage III: -Baseline creatinine appears to be in the 1.5 range -Last creatinine, 1.72 on 03/20/2018 -Please repeat labs prior to the surgery -Continue losartan -Follow renal function closely  5.  Neuropathy: -Patient with severe cervical disease with dense sensory deficit bilateral upper extremities, now status post cervical decompression per Dr. [name] -Has continued complaints of worsening of her extremity neuropathy which she is unable to perform most fine motor skills -See clearance as above  6.  Diastolic CHF: -Takes Lasix 40mg  twice per day, every other day and 40 mg daily on alternating days>>has been stable -Weight, 250lb today, was 250lb at last office visit  -Most recent echocardiogram 03/05/2018 with normal LV function and mild MR, overall good -No HF symptoms, appears euvolemic on exam -Lungs clear, minimal LE swelling   Current medicines are reviewed at length with the patient today.  The patient does not have concerns regarding medicines.  The following changes have  been made:  no change  Labs/ tests ordered today include: None   Orders Placed This Encounter  Procedures  . EKG 12-Lead    Disposition:   FU with Dr. Johnsie Cancel in 6 months    Signed, Kathyrn Drown, NP  11/27/2018 4:27 PM    [address], [place], [place]  [address] Phone: [phone]; Fax: [phone]

## 2018-11-27 ENCOUNTER — Other Ambulatory Visit: Payer: Self-pay

## 2018-11-27 ENCOUNTER — Encounter (INDEPENDENT_AMBULATORY_CARE_PROVIDER_SITE_OTHER): Payer: Self-pay

## 2018-11-27 ENCOUNTER — Ambulatory Visit (INDEPENDENT_AMBULATORY_CARE_PROVIDER_SITE_OTHER): Payer: Medicare Other | Admitting: Cardiology

## 2018-11-27 ENCOUNTER — Encounter: Payer: Self-pay | Admitting: Cardiology

## 2018-11-27 VITALS — BP 138/84 | HR 60 | Ht 68.0 in | Wt 250.4 lb

## 2018-11-27 DIAGNOSIS — I5032 Chronic diastolic (congestive) heart failure: Secondary | ICD-10-CM

## 2018-11-27 DIAGNOSIS — I2510 Atherosclerotic heart disease of native coronary artery without angina pectoris: Secondary | ICD-10-CM

## 2018-11-27 DIAGNOSIS — I10 Essential (primary) hypertension: Secondary | ICD-10-CM

## 2018-11-27 NOTE — Telephone Encounter (Signed)
   Primary Cardiologist: Jenkins Rouge, MD  Chart reviewed as part of pre-operative protocol coverage. Given past medical history and time since last visit, based on ACC/AHA guidelines, Gregory Guerrero would be at acceptable risk for the planned procedure without further cardiovascular testing.   I will route this recommendation to the requesting party via [facility] fax function and remove from pre-op pool.  Please call with questions.  Kathyrn Drown, NP 11/27/2018, 4:30 PM

## 2018-11-27 NOTE — Patient Instructions (Signed)
Medication Instructions:  Your physician recommends that you continue on your current medications as directed. Please refer to the Current Medication list given to you today.  If you need a refill on your cardiac medications before your next appointment, please call your pharmacy.   Lab work: NONE If you have labs (blood work) drawn today and your tests are completely normal, you will receive your results only by: Marland Kitchen MyChart Message (if you have MyChart) OR . A paper copy in the mail If you have any lab test that is abnormal or we need to change your treatment, we will call you to review the results.  Testing/Procedures: NONE  Follow-Up: At [facility], you and your health needs are our priority.  As part of our continuing mission to provide you with exceptional heart care, we have created designated Provider Care Teams.  These Care Teams include your primary Cardiologist (physician) and Advanced Practice Providers (APPs -  Physician Assistants and Nurse Practitioners) who all work together to provide you with the care you need, when you need it. You will need a follow up appointment in 6 months.  Please call our office 2 months in advance to schedule this appointment.  You may see Jenkins Rouge, MD or one of the following Advanced Practice Providers on your designated Care Team:   Truitt Merle, NP Cecilie Kicks, NP . Kathyrn Drown, NP  Any Other Special Instructions Will Be Listed Below (If Applicable).

## 2018-12-10 NOTE — Telephone Encounter (Signed)
Please see attached

## 2018-12-10 NOTE — Telephone Encounter (Signed)
EKG faxed using [facility] fax function.

## 2018-12-10 NOTE — Telephone Encounter (Signed)
Follow Up   Luciana from [facility] is requesting the EKG tracing from EKG done on 11/27/18 to be faxed to [phone]. Surgery is scheduled for 12/11/18.     Birdie Riddle can be reached at [phone]

## 2018-12-11 DIAGNOSIS — Z981 Arthrodesis status: Secondary | ICD-10-CM | POA: Diagnosis not present

## 2018-12-11 DIAGNOSIS — Z86718 Personal history of other venous thrombosis and embolism: Secondary | ICD-10-CM | POA: Diagnosis not present

## 2018-12-11 DIAGNOSIS — Z7982 Long term (current) use of aspirin: Secondary | ICD-10-CM | POA: Diagnosis not present

## 2018-12-11 DIAGNOSIS — M5011 Cervical disc disorder with radiculopathy,  high cervical region: Secondary | ICD-10-CM | POA: Diagnosis present

## 2018-12-11 DIAGNOSIS — M532X2 Spinal instabilities, cervical region: Secondary | ICD-10-CM | POA: Diagnosis not present

## 2018-12-11 DIAGNOSIS — Z96651 Presence of right artificial knee joint: Secondary | ICD-10-CM | POA: Diagnosis present

## 2018-12-11 DIAGNOSIS — Z87891 Personal history of nicotine dependence: Secondary | ICD-10-CM | POA: Diagnosis not present

## 2018-12-11 DIAGNOSIS — Z20828 Contact with and (suspected) exposure to other viral communicable diseases: Secondary | ICD-10-CM | POA: Diagnosis present

## 2018-12-11 DIAGNOSIS — G041 Tropical spastic paraplegia: Secondary | ICD-10-CM | POA: Diagnosis not present

## 2018-12-11 DIAGNOSIS — I10 Essential (primary) hypertension: Secondary | ICD-10-CM | POA: Diagnosis present

## 2018-12-11 DIAGNOSIS — M4712 Other spondylosis with myelopathy, cervical region: Secondary | ICD-10-CM | POA: Diagnosis present

## 2018-12-11 DIAGNOSIS — M4722 Other spondylosis with radiculopathy, cervical region: Secondary | ICD-10-CM | POA: Diagnosis present

## 2018-12-11 DIAGNOSIS — Q541 Hypospadias, penile: Secondary | ICD-10-CM | POA: Diagnosis not present

## 2018-12-11 DIAGNOSIS — Q249 Congenital malformation of heart, unspecified: Secondary | ICD-10-CM | POA: Diagnosis not present

## 2018-12-11 DIAGNOSIS — M4802 Spinal stenosis, cervical region: Secondary | ICD-10-CM | POA: Diagnosis not present

## 2018-12-11 DIAGNOSIS — R339 Retention of urine, unspecified: Secondary | ICD-10-CM | POA: Diagnosis not present

## 2018-12-11 DIAGNOSIS — Z955 Presence of coronary angioplasty implant and graft: Secondary | ICD-10-CM | POA: Diagnosis not present

## 2018-12-11 DIAGNOSIS — E785 Hyperlipidemia, unspecified: Secondary | ICD-10-CM | POA: Diagnosis present

## 2018-12-11 DIAGNOSIS — M2578 Osteophyte, vertebrae: Secondary | ICD-10-CM | POA: Diagnosis present

## 2018-12-11 DIAGNOSIS — E119 Type 2 diabetes mellitus without complications: Secondary | ICD-10-CM | POA: Diagnosis present

## 2018-12-11 DIAGNOSIS — M5001 Cervical disc disorder with myelopathy,  high cervical region: Secondary | ICD-10-CM | POA: Diagnosis present

## 2018-12-11 DIAGNOSIS — M5020 Other cervical disc displacement, unspecified cervical region: Secondary | ICD-10-CM | POA: Diagnosis not present

## 2018-12-11 HISTORY — PX: NECK HARDWARE REMOVAL: SUR1129

## 2019-01-10 DIAGNOSIS — Z23 Encounter for immunization: Secondary | ICD-10-CM | POA: Diagnosis not present

## 2019-01-24 DIAGNOSIS — M5020 Other cervical disc displacement, unspecified cervical region: Secondary | ICD-10-CM | POA: Diagnosis not present

## 2019-01-24 DIAGNOSIS — M47812 Spondylosis without myelopathy or radiculopathy, cervical region: Secondary | ICD-10-CM | POA: Diagnosis not present

## 2019-01-24 HISTORY — PX: ANTERIOR CERVICAL DECOMP/DISCECTOMY FUSION: SHX1161

## 2019-02-14 ENCOUNTER — Other Ambulatory Visit: Payer: Self-pay | Admitting: Cardiovascular Disease

## 2019-03-24 ENCOUNTER — Other Ambulatory Visit: Payer: Self-pay | Admitting: Cardiovascular Disease

## 2019-04-16 NOTE — Progress Notes (Signed)
Virtual Visit via Video Note  I connected with patient on 04/21/19 at 11:45 AM EST by audio enabled telemedicine application and verified that I am speaking with the correct person using two identifiers.   THIS ENCOUNTER IS A VIRTUAL VISIT DUE TO COVID-19 - PATIENT WAS NOT SEEN IN THE OFFICE. PATIENT HAS CONSENTED TO VIRTUAL VISIT / TELEMEDICINE VISIT   Location of patient: home  Location of provider: office  I discussed the limitations of evaluation and management by telemedicine and the availability of in person appointments. The patient expressed understanding and agreed to proceed.   Subjective:   Gregory Guerrero is a 73 y.o. male who presents for Medicare Annual/Subsequent preventive examination.  Review of Systems:  Home Safety/Smoke Alarms: Feels safe in home. Smoke alarms in place.  Lives with wife. Uses cane.  Male:   CCS-  declines   PSA- No results found for: PSA      Objective:    Vitals: BP (!) 144/80 Comment: pt reported  There is no height or weight on file to calculate BMI.  Advanced Directives 04/21/2019 06/21/2017 05/15/2017 04/30/2017 04/27/2017 02/14/2017 03/01/2016  Does Patient Have a Medical Advance Directive? No No No No No No No  Would patient like information on creating a medical advance directive? No - Patient declined No - Patient declined No - Patient declined No - Patient declined No - Patient declined No - Patient declined No - patient declined information    Tobacco Social History   Tobacco Use  Smoking Status Former Smoker  . Types: Cigarettes  . Quit date: 04/26/1983  . Years since quitting: 36.0  Smokeless Tobacco Never Used  Tobacco Comment   quit over 40 years ago      Counseling given: Not Answered Comment: quit over 40 years ago    Clinical Intake:     Pain Score: 2  Pain Location: Neck Pain Onset: More than a month ago Pain Frequency: Intermittent Pain Relieving Factors: meds  Pain Relieving Factors: meds               Past Medical History:  Diagnosis Date  . Arthritis   . CAD (coronary artery disease)   . Chronic kidney disease   . DVT (deep venous thrombosis) ([place])    following spinal surgery Nov 2017  . HTN (hypertension)   . MI (myocardial infarction) ([place])    1999 ; stents placed   . Numbness and tingling in both hands   . Spondylosis of cervical spine    Past Surgical History:  Procedure Laterality Date  . ANTERIOR CERVICAL DECOMP/DISCECTOMY FUSION N/A 06/27/2017   Procedure: ANTERIOR CERVICAL DECOMPRESSION/DISCECTOMY FUSION C3-4;  Surgeon: [name], MD;  Location: [facility];  Service: Orthopedics;  Laterality: N/A;  3 hrs  . ANTERIOR LAT LUMBAR FUSION N/A 03/01/2016   Procedure: EXTREME LATERAL INTERBODY FUSION  LUMBAR 2-4;  Surgeon: [name], MD;  Location: [facility];  Service: Orthopedics;  Laterality: N/A;  . CARDIAC CATHETERIZATION     20 yrs ago  . JOINT REPLACEMENT    . RADIOLOGY WITH ANESTHESIA N/A 05/15/2017   Procedure: MRI WITH ANESTHESIA CERVICAL SPINE WITH AND WITHOUT;  Surgeon: Radiologist, Medication, MD;  Location: [facility];  Service: Radiology;  Laterality: N/A;  . SPINAL FUSION N/A 03/02/2016   Procedure: POSTERIOR SPINAL FUSION INTERBODY L2-S1, DECOMPRESSION L4-S1;  Surgeon: [name], MD;  Location: [facility];  Service: Orthopedics;  Laterality: N/A;  . TOTAL KNEE ARTHROPLASTY Right 04/30/2017   Procedure: RIGHT TOTAL  KNEE ARTHROPLASTY;  Surgeon: [name], MD;  [facility];  Service: Orthopedics;  Laterality: Right;   Family History  Problem Relation Age of Onset  . Cancer - Other Mother   . Heart attack Father   . Uterine cancer Sister   . Colon cancer Neg Hx   . Prostate cancer Neg Hx    Social History   Socioeconomic History  . Marital status: Married    Spouse name: Not on file  . Number of children: 2  . Years of education: Not on file  . Highest education level: Not on file  Occupational History  . Occupation: retired , Pension scheme manager busines    Tobacco Use  . Smoking status: Former Smoker    Types: Cigarettes    Quit date: 04/26/1983    Years since quitting: 36.0  . Smokeless tobacco: Never Used  . Tobacco comment: quit over 40 years ago   Substance and Sexual Activity  . Alcohol use: No  . Drug use: No  . Sexual activity: Never  Other Topics Concern  . Not on file  Social History Narrative   Household pt, wife, a Pharmacologist from [place] Strain:   . Difficulty of Paying Living Expenses: Not on file  Food Insecurity:   . Worried About Charity fundraiser in the Last Year: Not on file  . Ran Out of Food in the Last Year: Not on file  Transportation Needs:   . Lack of Transportation (Medical): Not on file  . Lack of Transportation (Non-Medical): Not on file  Physical Activity:   . Days of Exercise per Week: Not on file  . Minutes of Exercise per Session: Not on file  Stress:   . Feeling of Stress : Not on file  Social Connections:   . Frequency of Communication with Friends and Family: Not on file  . Frequency of Social Gatherings with Friends and Family: Not on file  . Attends Religious Services: Not on file  . Active Member of Clubs or Organizations: Not on file  . Attends Archivist Meetings: Not on file  . Marital Status: Not on file    Outpatient Encounter Medications as of 04/21/2019  Medication Sig  . aspirin EC 81 MG tablet Take 81 mg by mouth daily.  Marland Kitchen atorvastatin (LIPITOR) 80 MG tablet TAKE 1 TABLET (80MG ) BY MOUTH EVERY OTHER DAY , TAKE 40 MG (1/2 TABLET) BY MOUTH ON ALTERNATE DAYS  . furosemide (LASIX) 40 MG tablet Take one tablet by mouth twice daily every other day, then take one tablet by mouth daily on alternating days.  Marland Kitchen gabapentin (NEURONTIN) 300 MG capsule Take 300-600 mg by mouth 3 (three) times daily. 300-600 mg po tid prn for neuralgia and peripheral neuropathy  . losartan (COZAAR) 100 MG tablet Take 1 tablet by mouth once  daily  . Magnesium 400 MG CAPS Take by mouth.  . metoprolol tartrate (LOPRESSOR) 25 MG tablet Take 1 tablet by mouth twice daily  . Multiple Vitamin (MULTIVITAMIN) capsule Take 1 capsule by mouth daily.  Marland Kitchen UNABLE TO FIND Med Name: Turmeric 1500 mg, one daily  . Ascorbic Acid (VITAMIN C) 1000 MG tablet Take 1,000 mg by mouth daily.   No facility-administered encounter medications on file as of 04/21/2019.    Activities of Daily Living In your present state of health, do you have any difficulty performing the following activities: 04/21/2019  Hearing? N  Vision? N  Difficulty concentrating or making decisions? N  Walking or climbing stairs? N  Dressing or bathing? N  Doing errands, shopping? N  Preparing Food and eating ? N  Using the Toilet? N  In the past six months, have you accidently leaked urine? N  Do you have problems with loss of bowel control? N  Managing your Medications? N  Managing your Finances? N  Housekeeping or managing your Housekeeping? N  Some recent data might be hidden    Patient Care Team: Colon Branch, MD as PCP - General (Internal Medicine) [name], MD as PCP - Cardiology (Cardiology) Pieter Partridge, DO as Consulting Physician (Neurology) [name], MD as Consulting Physician (Orthopedic Surgery) [name], MD as Consulting Physician (Orthopedic Surgery)   Assessment:   This is a routine wellness examination for [place]. Physical assessment deferred to PCP.  Exercise Activities and Dietary recommendations Current Exercise Habits: Home exercise routine, Type of exercise: walking, Time (Minutes): 10, Frequency (Times/Week): 7, Weekly Exercise (Minutes/Week): 70, Exercise limited by: None identified Diet (meal preparation, eat out, water intake, caffeinated beverages, dairy products, fruits and vegetables): well balanced   Goals    . Have knee surgery/ improve knee pain       Fall Risk Fall Risk  04/21/2019 11/15/2018 03/01/2017 02/14/2017  01/24/2017  Falls in the past year? 0 (No Data) No No No  Comment - [facility] Telephone Survey: data to providers prior to load - - -  Number falls in past yr: - (No Data) - - -  Comment - [facility] Telephone Survey Actual Response =  - - -     Depression Screen PHQ 2/9 Scores 04/21/2019 02/14/2017 01/24/2017  PHQ - 2 Score 0 0 0    Cognitive Function Ad8 score reviewed for issues:  Issues making decisions:no  Less interest in hobbies / activities:no  Repeats questions, stories (family complaining):no  Trouble using ordinary gadgets (microwave, computer, phone):no  Forgets the month or year: no  Mismanaging finances:  no  Remembering appts:no  Daily problems with thinking and/or memory:no Ad8 score is=0         Immunization History  Administered Date(s) Administered  . Influenza, High Dose Seasonal PF 02/04/2018  . Influenza-Unspecified 01/23/2017  . Pneumococcal Polysaccharide-23 02/04/2018     Screening Tests Health Maintenance  Topic Date Due  . Hepatitis C Screening  1946-03-22  . FOOT EXAM  02/12/1954  . OPHTHALMOLOGY EXAM  02/12/1954  . TETANUS/TDAP  02/13/1963  . COLONOSCOPY  02/12/1994  . HEMOGLOBIN A1C  07/04/2017  . INFLUENZA VACCINE  11/16/2018  . PNA vac Low Risk Adult (2 of 2 - PCV13) 02/05/2019       Plan:   See you next year!  Continue to eat heart healthy diet (full of fruits, vegetables, whole grains, lean protein, water--limit salt, fat, and sugar intake) and increase physical activity as tolerated.  Continue doing brain stimulating activities (puzzles, reading, adult coloring books, staying active) to keep memory sharp.   Bring a copy of your living will and/or healthcare power of attorney to your next office visit.   I have personally reviewed and noted the following in the patient's chart:   . Medical and social history . Use of alcohol, tobacco or illicit drugs  . Current medications and supplements . Functional ability and  status . Nutritional status . Physical activity . Advanced directives . List of other physicians . Hospitalizations, surgeries, and ER visits in previous 58  months . Vitals . Screenings to include cognitive, depression, and falls . Referrals and appointments  In addition, I have reviewed and discussed with patient certain preventive protocols, quality metrics, and best practice recommendations. A written personalized care plan for preventive services as well as general preventive health recommendations were provided to patient.     Shela Nevin, [place]  04/21/2019

## 2019-04-17 ENCOUNTER — Ambulatory Visit: Payer: Medicare Other | Admitting: *Deleted

## 2019-04-21 ENCOUNTER — Other Ambulatory Visit: Payer: Self-pay

## 2019-04-21 ENCOUNTER — Encounter: Payer: Self-pay | Admitting: *Deleted

## 2019-04-21 ENCOUNTER — Ambulatory Visit (INDEPENDENT_AMBULATORY_CARE_PROVIDER_SITE_OTHER): Payer: Medicare Other | Admitting: *Deleted

## 2019-04-21 VITALS — BP 144/80

## 2019-04-21 DIAGNOSIS — Z0000 Encounter for general adult medical examination without abnormal findings: Secondary | ICD-10-CM | POA: Diagnosis not present

## 2019-04-21 NOTE — Patient Instructions (Signed)
See you next year!  Continue to eat heart healthy diet (full of fruits, vegetables, whole grains, lean protein, water--limit salt, fat, and sugar intake) and increase physical activity as tolerated.  Continue doing brain stimulating activities (puzzles, reading, adult coloring books, staying active) to keep memory sharp.   Bring a copy of your living will and/or healthcare power of attorney to your next office visit.   Gregory Guerrero , Thank you for taking time to come for your Medicare Wellness Visit. I appreciate your ongoing commitment to your health goals. Please review the following plan we discussed and let me know if I can assist you in the future.   These are the goals we discussed: Goals    . Have knee surgery/ improve knee pain       This is a list of the screening recommended for you and due dates:  Health Maintenance  Topic Date Due  .  Hepatitis C: One time screening is recommended by Center for Disease Control  (CDC) for  adults born from [birth year] through 1967.   31-Oct-1945  . Complete foot exam   02/12/1954  . Eye exam for diabetics  02/12/1954  . Tetanus Vaccine  02/13/1963  . Colon Cancer Screening  02/12/1994  . Hemoglobin A1C  07/04/2017  . Flu Shot  11/16/2018  . Pneumonia vaccines (2 of 2 - PCV13) 02/05/2019    Preventive Care 76 Years and Older, Male Preventive care refers to lifestyle choices and visits with your health care provider that can promote health and wellness. This includes:  A yearly physical exam. This is also called an annual well check.  Regular dental and eye exams.  Immunizations.  Screening for certain conditions.  Healthy lifestyle choices, such as diet and exercise. What can I expect for my preventive care visit? Physical exam Your health care provider will check:  Height and weight. These may be used to calculate body mass index (BMI), which is a measurement that tells if you are at a healthy weight.  Heart rate and blood  pressure.  Your skin for abnormal spots. Counseling Your health care provider may ask you questions about:  Alcohol, tobacco, and drug use.  Emotional well-being.  Home and relationship well-being.  Sexual activity.  Eating habits.  History of falls.  Memory and ability to understand (cognition).  Work and work Statistician. What immunizations do I need?  Influenza (flu) vaccine  This is recommended every year. Tetanus, diphtheria, and pertussis (Tdap) vaccine  You may need a Td booster every 10 years. Varicella (chickenpox) vaccine  You may need this vaccine if you have not already been vaccinated. Zoster (shingles) vaccine  You may need this after age 20. Pneumococcal conjugate (PCV13) vaccine  One dose is recommended after age 76. Pneumococcal polysaccharide (PPSV23) vaccine  One dose is recommended after age 76. Measles, mumps, and rubella (MMR) vaccine  You may need at least one dose of MMR if you were born in 1957 or later. You may also need a second dose. Meningococcal conjugate (MenACWY) vaccine  You may need this if you have certain conditions. Hepatitis A vaccine  You may need this if you have certain conditions or if you travel or work in places where you may be exposed to hepatitis A. Hepatitis B vaccine  You may need this if you have certain conditions or if you travel or work in places where you may be exposed to hepatitis B. Haemophilus influenzae type b (Hib) vaccine  You may need  this if you have certain conditions. You may receive vaccines as individual doses or as more than one vaccine together in one shot (combination vaccines). Talk with your health care provider about the risks and benefits of combination vaccines. What tests do I need? Blood tests  Lipid and cholesterol levels. These may be checked every 5 years, or more frequently depending on your overall health.  Hepatitis C test.  Hepatitis B test. Screening  Lung cancer  screening. You may have this screening every year starting at age 76 if you have a 30-pack-year history of smoking and currently smoke or have quit within the past 15 years.  Colorectal cancer screening. All adults should have this screening starting at age 76 and continuing until age 15. Your health care provider may recommend screening at age 8 if you are at increased risk. You will have tests every 1-10 years, depending on your results and the type of screening test.  Prostate cancer screening. Recommendations will vary depending on your family history and other risks.  Diabetes screening. This is done by checking your blood sugar (glucose) after you have not eaten for a while (fasting). You may have this done every 1-3 years.  Abdominal aortic aneurysm (AAA) screening. You may need this if you are a current or former smoker.  Sexually transmitted disease (STD) testing. Follow these instructions at home: Eating and drinking  Eat a diet that includes fresh fruits and vegetables, whole grains, lean protein, and low-fat dairy products. Limit your intake of foods with high amounts of sugar, saturated fats, and salt.  Take vitamin and mineral supplements as recommended by your health care provider.  Do not drink alcohol if your health care provider tells you not to drink.  If you drink alcohol: ? Limit how much you have to 0-2 drinks a day. ? Be aware of how much alcohol is in your drink. In the [place] one drink equals one 12 oz bottle of beer (355 mL), one 5 oz glass of wine (148 mL), or one 1 oz glass of hard liquor (44 mL). Lifestyle  Take daily care of your teeth and gums.  Stay active. Exercise for at least 30 minutes on 5 or more days each week.  Do not use any products that contain nicotine or tobacco, such as cigarettes, e-cigarettes, and chewing tobacco. If you need help quitting, ask your health care provider.  If you are sexually active, practice safe sex. Use a condom or  other form of protection to prevent STIs (sexually transmitted infections).  Talk with your health care provider about taking a low-dose aspirin or statin. What's next?  Visit your health care provider once a year for a well check visit.  Ask your health care provider how often you should have your eyes and teeth checked.  Stay up to date on all vaccines. This information is not intended to replace advice given to you by your health care provider. Make sure you discuss any questions you have with your health care provider. Document Revised: 03/28/2018 Document Reviewed: 03/28/2018 Elsevier Patient Education  2020 [employer].

## 2019-05-26 ENCOUNTER — Ambulatory Visit: Payer: Medicare Other

## 2019-06-16 ENCOUNTER — Telehealth: Payer: Self-pay | Admitting: Cardiovascular Disease

## 2019-06-16 ENCOUNTER — Other Ambulatory Visit: Payer: Self-pay

## 2019-06-16 MED ORDER — METOPROLOL TARTRATE 25 MG PO TABS: 25.0000 mg | ORAL_TABLET | Freq: Two times a day (BID) | ORAL | 1 refills | Status: DC

## 2019-06-16 NOTE — Telephone Encounter (Signed)
*  STAT* If patient is at the pharmacy, call can be transferred to refill team.   1. Which medications need to be refilled? (please list name of each medication and dose if known) metoprolol tartrate (LOPRESSOR) 25 MG tablet  2. Which pharmacy/location (including street and city if local pharmacy) is medication to be sent to? [place], [place]  3. Do they need a 30 day or 90 day supply? 90 day

## 2019-07-01 ENCOUNTER — Other Ambulatory Visit: Payer: Self-pay | Admitting: Cardiovascular Disease

## 2019-07-14 NOTE — Progress Notes (Signed)
Cardiology Office Note    Date:  07/17/2019   ID:  Gregory Guerrero, Gregory Guerrero 07-16-1945, MRN [id number]  PCP:  Colon Branch, MD  Cardiologist:  Dr.Dominiq Fontaine   Chief Complaint: Dyspnea  History of Present Illness:   74 y.o. remote stenting of LAD in [name] . History of DVT, HTN, HLD and CRF Chronic venous Disease with edema and right sided ulcers in past. Same dyspnea 01/2017 BNP and d dimer up V/Q negative started on lasix with improvement HTN and PVCls improved with beta blocker  Echo reviewed 02/01/17 EF 82% grade 2 diastolic mild MR  ETT 99/3/71 normal   04/30/17 Right TKR Alusio no cardiac complications   MRI 6/96/78 with severe disc and facet degeneration multilevel  Cervical decompression with [name] 06/27/17 little improvement Placed on Gabapentin Had repeat surgery at [facility] but again little improvement  No cardiac symptoms Has had COVID vaccine   Past Medical History:  Diagnosis Date  . Arthritis   . CAD (coronary artery disease)   . Chronic kidney disease   . DVT (deep venous thrombosis) ([place])    following spinal surgery Nov 2017  . HTN (hypertension)   . MI (myocardial infarction) ([place])    1999 ; stents placed   . Numbness and tingling in both hands   . Spondylosis of cervical spine     Past Surgical History:  Procedure Laterality Date  . ANTERIOR CERVICAL DECOMP/DISCECTOMY FUSION N/A 06/27/2017   Procedure: ANTERIOR CERVICAL DECOMPRESSION/DISCECTOMY FUSION C3-4;  Surgeon: [name], MD;  Location: [facility];  Service: Orthopedics;  Laterality: N/A;  3 hrs  . ANTERIOR LAT LUMBAR FUSION N/A 03/01/2016   Procedure: EXTREME LATERAL INTERBODY FUSION  LUMBAR 2-4;  Surgeon: [name], MD;  Location: [facility];  Service: Orthopedics;  Laterality: N/A;  . CARDIAC CATHETERIZATION     20 yrs ago  . JOINT REPLACEMENT    . RADIOLOGY WITH ANESTHESIA N/A 05/15/2017   Procedure: MRI WITH ANESTHESIA CERVICAL SPINE WITH AND WITHOUT;  Surgeon: Radiologist, Medication, MD;   Location: [facility];  Service: Radiology;  Laterality: N/A;  . SPINAL FUSION N/A 03/02/2016   Procedure: POSTERIOR SPINAL FUSION INTERBODY L2-S1, DECOMPRESSION L4-S1;  Surgeon: [name], MD;  Location: [facility];  Service: Orthopedics;  Laterality: N/A;  . TOTAL KNEE ARTHROPLASTY Right 04/30/2017   Procedure: RIGHT TOTAL KNEE ARTHROPLASTY;  Surgeon: [name], MD;  [facility];  Service: Orthopedics;  Laterality: Right;    Current Medications: Prior to Admission medications   Medication Sig Start Date End Date Taking? Authorizing Provider  Ascorbic Acid (VITAMIN C) 1000 MG tablet Take 1,000 mg by mouth daily.    [provider]  aspirin EC 81 MG tablet Take 81 mg by mouth daily.    [provider]  furosemide (LASIX) 20 MG tablet Take 1 tablet (20 mg total) by mouth daily. 01/25/17   Colon Branch, MD  losartan (COZAAR) 100 MG tablet Take 1 tablet (100 mg total) by mouth daily. 01/05/17   [name], MD  Multiple Vitamin (MULTIVITAMIN WITH MINERALS) TABS tablet Take 1 tablet by mouth daily.    [provider]  rosuvastatin (CRESTOR) 20 MG tablet Take 1 tablet (20 mg total) by mouth at bedtime. 01/24/17   Colon Branch, MD    Allergies:   Patient has no known allergies.   Social History   Socioeconomic History  . Marital status: Married    Spouse name: Not on file  .  Number of children: 2  . Years of education: Not on file  . Highest education level: Not on file  Occupational History  . Occupation: retired , Pension scheme manager busines   Tobacco Use  . Smoking status: Former Smoker    Types: Cigarettes    Quit date: 04/26/1983    Years since quitting: 36.2  . Smokeless tobacco: Never Used  . Tobacco comment: quit over 40 years ago   Substance and Sexual Activity  . Alcohol use: No  . Drug use: No  . Sexual activity: Never  Other Topics Concern  . Not on file  Social History Narrative   Household pt, wife, a Pharmacologist from [place] Strain:   . Difficulty of Paying Living Expenses:   Food Insecurity:   . Worried About Charity fundraiser in the Last Year:   . Arboriculturist in the Last Year:   Transportation Needs:   . Film/video editor (Medical):   Marland Kitchen Lack of Transportation (Non-Medical):   Physical Activity:   . Days of Exercise per Week:   . Minutes of Exercise per Session:   Stress:   . Feeling of Stress :   Social Connections:   . Frequency of Communication with Friends and Family:   . Frequency of Social Gatherings with Friends and Family:   . Attends Religious Services:   . Active Member of Clubs or Organizations:   . Attends Archivist Meetings:   Marland Kitchen Marital Status:      Family History:  The patient's family history includes Cancer - Other in his mother; Heart attack in his father; Uterine cancer in his sister.  ROS:   Please see the history of present illness.    ROS All other systems reviewed and are negative.   PHYSICAL EXAM:   VS:  BP 126/76   Pulse (!) 59   Ht 5\' 9"  (1.753 m)   Wt 249 lb (112.9 kg)   SpO2 98%   BMI 36.77 kg/m    Affect appropriate Overweight white male  HEENT: normal Neck supple with no adenopathy JVP normal no bruits no thyromegaly Lungs decreased BS bilateral bases  Heart:  S1/S2 SEM  murmur, no rub, gallop or click PMI normal Abdomen: benighn, BS positve, no tenderness, no AAA no bruit.  No HSM or HJR Distal pulses intact with no bruits Plus 1 RLE edema chronic improved  Neuro non-focal Skin warm and dry No muscular weakness Post right TKR    Wt Readings from Last 3 Encounters:  07/17/19 249 lb (112.9 kg)  11/27/18 250 lb 6.4 oz (113.6 kg)  03/28/18 250 lb (113.4 kg)      Studies/Labs Reviewed:   EKG:    02-19-2017 SR rate 71 normal PVC nonspecific ST changes   Recent Labs: No results found for requested labs within last 8760 hours.   Lipid Panel    Component Value Date/Time   CHOL  166 01/15/2018 1516   TRIG 189 (H) 01/15/2018 1516   HDL 42 01/15/2018 1516   CHOLHDL 4.0 01/15/2018 1516   CHOLHDL 4 06/24/2013 1113   VLDL 16.2 06/24/2013 1113   LDLCALC 86 01/15/2018 1516    Additional studies/ records that were reviewed today include:  AS above    ASSESSMENT & PLAN:     1. CAD s/p remote LAD stenting -  No chest pain. Continue ASA and statin. ETT  02/23/17 non ischemic continue ASA and beta blocker   2. HTN- Well controlled.  Continue current medications and low sodium Dash type diet.    3. HLD-  Continue statin labs with primary   4. CKD stage III  Baseline Cr 1.5 stable   5. Ortho:  Post right TKR continue PT/OT     6. 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 Diastolic CHF:  Discussed needing to take lasix bid daily despite urinary frequency  [id number]  TTE shows continued normal EF and no really bad valve disease   Needs to exercise  And eat much better as well   Jenkins Rouge

## 2019-07-17 ENCOUNTER — Encounter: Payer: Self-pay | Admitting: Cardiovascular Disease

## 2019-07-17 ENCOUNTER — Other Ambulatory Visit: Payer: Self-pay

## 2019-07-17 ENCOUNTER — Ambulatory Visit (INDEPENDENT_AMBULATORY_CARE_PROVIDER_SITE_OTHER): Payer: Medicare Other | Admitting: Cardiovascular Disease

## 2019-07-17 VITALS — BP 126/76 | HR 59 | Ht 69.0 in | Wt 249.0 lb

## 2019-07-17 DIAGNOSIS — I2510 Atherosclerotic heart disease of native coronary artery without angina pectoris: Secondary | ICD-10-CM | POA: Diagnosis not present

## 2019-07-17 NOTE — Patient Instructions (Signed)

## 2019-11-24 DIAGNOSIS — M4317 Spondylolisthesis, lumbosacral region: Secondary | ICD-10-CM | POA: Diagnosis not present

## 2019-11-24 DIAGNOSIS — M545 Low back pain: Secondary | ICD-10-CM | POA: Diagnosis not present

## 2019-11-24 DIAGNOSIS — M48061 Spinal stenosis, lumbar region without neurogenic claudication: Secondary | ICD-10-CM | POA: Diagnosis not present

## 2019-11-24 DIAGNOSIS — M47812 Spondylosis without myelopathy or radiculopathy, cervical region: Secondary | ICD-10-CM | POA: Diagnosis not present

## 2019-11-24 DIAGNOSIS — M542 Cervicalgia: Secondary | ICD-10-CM | POA: Diagnosis not present

## 2019-11-24 DIAGNOSIS — M4316 Spondylolisthesis, lumbar region: Secondary | ICD-10-CM | POA: Diagnosis not present

## 2019-11-24 DIAGNOSIS — Z981 Arthrodesis status: Secondary | ICD-10-CM | POA: Diagnosis not present

## 2019-11-24 DIAGNOSIS — M8588 Other specified disorders of bone density and structure, other site: Secondary | ICD-10-CM | POA: Diagnosis not present

## 2019-12-17 ENCOUNTER — Other Ambulatory Visit: Payer: Self-pay | Admitting: Cardiovascular Disease

## 2020-01-13 NOTE — Progress Notes (Signed)
Virtual Visit via Video Note   This visit type was conducted due to national recommendations for restrictions regarding the COVID-19 Pandemic (e.g. social distancing) in an effort to limit this patient's exposure and mitigate transmission in our community.  Due to her co-morbid illnesses, this patient is at least at moderate risk for complications without adequate follow up.  This format is felt to be most appropriate for this patient at this time.  All issues noted in this document were discussed and addressed.  A limited physical exam was performed with this format.  Please refer to the patient's chart for her consent to telehealth for [facility].   Patient Location : Home Physician Location: Office  Date:  01/15/2020   ID:  HAKOP HUMBARGER, DOB 03-05-1946, MRN [id number]  PCP:  Colon Branch, MD  Cardiologist:  Dr.Malayshia All   Chief Complaint: Dyspnea  History of Present Illness:   74 y.o. remote stenting of LAD in [name] . History of DVT, HTN, HLD and CRF Chronic venous Disease with edema and right sided ulcers in past. Same dyspnea 01/2017 BNP and d dimer up V/Q negative started on lasix with improvement HTN and PVCls improved with beta blocker  Echo reviewed 02/01/17 EF 84% grade 2 diastolic mild MR  ETT 69/6/29 normal   04/30/17 Right TKR Alusio no cardiac complications  MRI 09/12/39 with severe disc and facet degeneration multilevel  Cervical decompression with [name] 06/27/17 little improvement Placed on Gabapentin Had repeat surgery at [facility] but again little improvement  No cardiac symptoms Has had COVID vaccine getting flu shot and booster latter in Fall Has lost some weight and eating better  No angina doing well  Past Medical History:  Diagnosis Date  . Arthritis   . CAD (coronary artery disease)   . Chronic kidney disease   . DVT (deep venous thrombosis) ([place])    following spinal surgery Nov 2017  . HTN (hypertension)   . MI (myocardial infarction) ([place])     1999 ; stents placed   . Numbness and tingling in both hands   . Spondylosis of cervical spine     Past Surgical History:  Procedure Laterality Date  . ANTERIOR CERVICAL DECOMP/DISCECTOMY FUSION N/A 06/27/2017   Procedure: ANTERIOR CERVICAL DECOMPRESSION/DISCECTOMY FUSION C3-4;  Surgeon: [name], MD;  Location: [place];  Service: Orthopedics;  Laterality: N/A;  3 hrs  . ANTERIOR LAT LUMBAR FUSION N/A 03/01/2016   Procedure: EXTREME LATERAL INTERBODY FUSION  LUMBAR 2-4;  Surgeon: [name], MD;  Location: [place];  Service: Orthopedics;  Laterality: N/A;  . CARDIAC CATHETERIZATION     20 yrs ago  . JOINT REPLACEMENT    . RADIOLOGY WITH ANESTHESIA N/A 05/15/2017   Procedure: MRI WITH ANESTHESIA CERVICAL SPINE WITH AND WITHOUT;  Surgeon: Radiologist, Medication, MD;  Location: [place];  Service: Radiology;  Laterality: N/A;  . SPINAL FUSION N/A 03/02/2016   Procedure: POSTERIOR SPINAL FUSION INTERBODY L2-S1, DECOMPRESSION L4-S1;  Surgeon: [name], MD;  Location: [place];  Service: Orthopedics;  Laterality: N/A;  . TOTAL KNEE ARTHROPLASTY Right 04/30/2017   Procedure: RIGHT TOTAL KNEE ARTHROPLASTY;  Surgeon: [name], MD;  Location[facility];  Service: Orthopedics;  Laterality: Right;    Current Medications: Prior to Admission medications   Medication Sig Start Date End Date Taking? Authorizing Provider  Ascorbic Acid (VITAMIN C) 1000 MG tablet Take 1,000 mg by mouth daily.    [provider]  aspirin EC 81 MG tablet Take  81 mg by mouth daily.    [provider]  furosemide (LASIX) 20 MG tablet Take 1 tablet (20 mg total) by mouth daily. 01/25/17   Colon Branch, MD  losartan (COZAAR) 100 MG tablet Take 1 tablet (100 mg total) by mouth daily. 01/05/17   [name], MD  Multiple Vitamin (MULTIVITAMIN WITH MINERALS) TABS tablet Take 1 tablet by mouth daily.    [provider]  rosuvastatin (CRESTOR) 20 MG tablet Take 1 tablet (20 mg total) by mouth at  bedtime. 18-Feb-2017   Colon Branch, MD    Allergies:   Patient has no known allergies.   Social History   Socioeconomic History  . Marital status: Married    Spouse name: Not on file  . Number of children: 2  . Years of education: Not on file  . Highest education level: Not on file  Occupational History  . Occupation: retired , Pension scheme manager busines   Tobacco Use  . Smoking status: Former Smoker    Types: Cigarettes    Quit date: 04/26/1983    Years since quitting: 36.7  . Smokeless tobacco: Never Used  . Tobacco comment: quit over 40 years ago   Vaping Use  . Vaping Use: Never used  Substance and Sexual Activity  . Alcohol use: No  . Drug use: No  . Sexual activity: Never  Other Topics Concern  . Not on file  Social History Narrative   Household pt, wife, a Pharmacologist from [place] Strain:   . Difficulty of Paying Living Expenses: Not on file  Food Insecurity:   . Worried About Charity fundraiser in the Last Year: Not on file  . Ran Out of Food in the Last Year: Not on file  Transportation Needs:   . Lack of Transportation (Medical): Not on file  . Lack of Transportation (Non-Medical): Not on file  Physical Activity:   . Days of Exercise per Week: Not on file  . Minutes of Exercise per Session: Not on file  Stress:   . Feeling of Stress : Not on file  Social Connections:   . Frequency of Communication with Friends and Family: Not on file  . Frequency of Social Gatherings with Friends and Family: Not on file  . Attends Religious Services: Not on file  . Active Member of Clubs or Organizations: Not on file  . Attends Archivist Meetings: Not on file  . Marital Status: Not on file     Family History:  The patient's family history includes Cancer - Other in his mother; Heart attack in his father; Uterine cancer in his sister.  ROS:   Please see the history of present illness.    ROS All other systems  reviewed and are negative.   PHYSICAL EXAM:   VS:  BP 136/86   Pulse (!) 59   Ht 5\' 8"  (1.727 m)   Wt 225 lb (102.1 kg)   BMI 34.21 kg/m     Phone no exam    Wt Readings from Last 3 Encounters:  01/15/20 225 lb (102.1 kg)  07/17/19 249 lb (112.9 kg)  11/27/18 250 lb 6.4 oz (113.6 kg)      Studies/Labs Reviewed:   EKG:    02-18-2017 SR rate 71 normal PVC nonspecific ST changes   Recent Labs: No results found for requested labs within last 8760 hours.  Lipid Panel    Component Value Date/Time   CHOL 166 01/15/2018 1516   TRIG 189 (H) 01/15/2018 1516   HDL 42 01/15/2018 1516   CHOLHDL 4.0 01/15/2018 1516   CHOLHDL 4 06/24/2013 1113   VLDL 16.2 06/24/2013 1113   LDLCALC 86 01/15/2018 1516    Additional studies/ records that were reviewed today include:  AS above    ASSESSMENT & PLAN:     1. CAD s/p remote LAD stenting -  No chest pain. ETT 02/23/17 non ischemic continue ASA and beta blocker and statin   2. HTN- Well controlled.  Continue current medications and low sodium Dash type diet.    3. HLD-  Continue statin labs with primary   4. CKD stage III  Baseline Cr 1.5 stable   5. Ortho:  Post right TKR continue PT/OT     6. Neuropathy:  Severe cervical disease with dense sensory deficit in both UE;s Post cervical decompression F/U [name] He has stopped Gabapentin on his Own as it made him feel poorly   7. Diastolic CHF:  Discussed needing to take lasix bid daily despite urinary frequency  [id number]  TTE shows continued normal EF and no really bad valve disease   Continue with better diet and weight loss efforts    Jenkins Rouge

## 2020-01-15 ENCOUNTER — Telehealth (INDEPENDENT_AMBULATORY_CARE_PROVIDER_SITE_OTHER): Payer: Medicare Other | Admitting: Cardiovascular Disease

## 2020-01-15 ENCOUNTER — Other Ambulatory Visit: Payer: Self-pay

## 2020-01-15 VITALS — BP 136/86 | HR 59 | Ht 68.0 in | Wt 225.0 lb

## 2020-01-15 DIAGNOSIS — I2510 Atherosclerotic heart disease of native coronary artery without angina pectoris: Secondary | ICD-10-CM | POA: Diagnosis not present

## 2020-01-15 NOTE — Patient Instructions (Signed)

## 2020-01-16 DIAGNOSIS — Z23 Encounter for immunization: Secondary | ICD-10-CM | POA: Diagnosis not present

## 2020-02-14 ENCOUNTER — Other Ambulatory Visit: Payer: Self-pay | Admitting: Cardiovascular Disease

## 2020-02-18 DIAGNOSIS — M25562 Pain in left knee: Secondary | ICD-10-CM | POA: Diagnosis not present

## 2020-02-18 DIAGNOSIS — Z96651 Presence of right artificial knee joint: Secondary | ICD-10-CM | POA: Diagnosis not present

## 2020-03-10 ENCOUNTER — Other Ambulatory Visit: Payer: Self-pay | Admitting: Cardiovascular Disease

## 2020-04-08 DIAGNOSIS — M25562 Pain in left knee: Secondary | ICD-10-CM | POA: Diagnosis not present

## 2020-04-08 DIAGNOSIS — M1712 Unilateral primary osteoarthritis, left knee: Secondary | ICD-10-CM | POA: Diagnosis not present

## 2020-04-29 DIAGNOSIS — M25562 Pain in left knee: Secondary | ICD-10-CM | POA: Diagnosis not present

## 2020-04-29 DIAGNOSIS — M1712 Unilateral primary osteoarthritis, left knee: Secondary | ICD-10-CM | POA: Diagnosis not present

## 2020-06-02 NOTE — Progress Notes (Signed)
Date:  06/08/2020   ID:  Gregory Guerrero, DOB 1946/01/23, MRN [id number]  PCP:  Colon Branch, MD  Cardiologist:  Dr.Shanekqua Schaper   Chief Complaint: Dyspnea  History of Present Illness:   75 y.o. remote stenting of LAD in [name] . History of DVT, HTN, HLD and CRF Chronic venous Disease with edema and right sided ulcers in past. Same dyspnea 01/2017 BNP and d dimer up V/Q negative started on lasix with improvement HTN and PVCls improved with beta blocker  Echo reviewed 02/01/17 EF 26% grade 2 diastolic mild MR  ETT 94/8/54 normal   04/30/17 Right TKR Alusio no cardiac complications  MRI 10/11/01 with severe disc and facet degeneration multilevel  Cervical decompression with [name] 06/27/17 little improvement Placed on Gabapentin Had repeat surgery at [facility] but again little improvement  No cardiac symptoms Has had COVID vaccine getting flu shot and booster latter in Fall Has lost some weight and eating better  Noted new onset afib today in clinic Asymptomatic and rate controlled on lopressor already Long discussion about diagnosis , Rx including anticoagulation, and strategy of conversion vs Rate control His GFR is only 43 will need renal dosing of eliquis   Past Medical History:  Diagnosis Date  . Arthritis   . CAD (coronary artery disease)   . Chronic kidney disease   . DVT (deep venous thrombosis) ([place])    following spinal surgery Nov 2017  . HTN (hypertension)   . MI (myocardial infarction) ([place])    1999 ; stents placed   . Numbness and tingling in both hands   . Spondylosis of cervical spine     Past Surgical History:  Procedure Laterality Date  . ANTERIOR CERVICAL DECOMP/DISCECTOMY FUSION N/A 06/27/2017   Procedure: ANTERIOR CERVICAL DECOMPRESSION/DISCECTOMY FUSION C3-4;  Surgeon: [name], MD;  Location: [place];  Service: Orthopedics;  Laterality: N/A;  3 hrs  . ANTERIOR LAT LUMBAR FUSION N/A 03/01/2016   Procedure: EXTREME LATERAL INTERBODY FUSION  LUMBAR 2-4;   Surgeon: [name], MD;  Location: [place];  Service: Orthopedics;  Laterality: N/A;  . CARDIAC CATHETERIZATION     20 yrs ago  . JOINT REPLACEMENT    . RADIOLOGY WITH ANESTHESIA N/A 05/15/2017   Procedure: MRI WITH ANESTHESIA CERVICAL SPINE WITH AND WITHOUT;  Surgeon: Radiologist, Medication, MD;  Location: [place];  Service: Radiology;  Laterality: N/A;  . SPINAL FUSION N/A 03/02/2016   Procedure: POSTERIOR SPINAL FUSION INTERBODY L2-S1, DECOMPRESSION L4-S1;  Surgeon: [name], MD;  Location: [place];  Service: Orthopedics;  Laterality: N/A;  . TOTAL KNEE ARTHROPLASTY Right 04/30/2017   Procedure: RIGHT TOTAL KNEE ARTHROPLASTY;  Surgeon: [name], MD;  [facility];  Service: Orthopedics;  Laterality: Right;    Current Medications: Prior to Admission medications   Medication Sig Start Date End Date Taking? Authorizing Provider  Ascorbic Acid (VITAMIN C) 1000 MG tablet Take 1,000 mg by mouth daily.    [provider]  aspirin EC 81 MG tablet Take 81 mg by mouth daily.    [provider]  furosemide (LASIX) 20 MG tablet Take 1 tablet (20 mg total) by mouth daily. 01/25/17   Colon Branch, MD  losartan (COZAAR) 100 MG tablet Take 1 tablet (100 mg total) by mouth daily. 01/05/17   [name], MD  Multiple Vitamin (MULTIVITAMIN WITH MINERALS) TABS tablet Take 1 tablet by mouth daily.    [provider]  rosuvastatin (CRESTOR) 20 MG tablet Take 1  tablet (20 mg total) by mouth at bedtime. February 20, 2017   Colon Branch, MD    Allergies:   Patient has no known allergies.   Social History   Socioeconomic History  . Marital status: Married    Spouse name: Not on file  . Number of children: 2  . Years of education: Not on file  . Highest education level: Not on file  Occupational History  . Occupation: retired , Pension scheme manager busines   Tobacco Use  . Smoking status: Former Smoker    Types: Cigarettes    Quit date: 04/26/1983    Years since quitting: 37.1  .  Smokeless tobacco: Never Used  . Tobacco comment: quit over 40 years ago   Vaping Use  . Vaping Use: Never used  Substance and Sexual Activity  . Alcohol use: No  . Drug use: No  . Sexual activity: Never  Other Topics Concern  . Not on file  Social History Narrative   Household pt, wife, a Pharmacologist from [place] Strain: Not on [employer] Insecurity: Not on file  Transportation Needs: Not on file  Physical Activity: Not on file  Stress: Not on file  Social Connections: Not on file     Family History:  The patient's family history includes Cancer - Other in his mother; Heart attack in his father; Uterine cancer in his sister.  ROS:   Please see the history of present illness.    ROS All other systems reviewed and are negative.   PHYSICAL EXAM:   VS:  BP 130/88   Pulse 75   Ht 5' 8.5" (1.74 m)   Wt 97.5 kg   SpO2 97%   BMI 32.22 kg/m     Affect appropriate Healthy:  appears stated age 50: normal Neck supple with no adenopathy JVP normal no bruits no thyromegaly Lungs clear with no wheezing and good diaphragmatic motion Heart:  S1/S2 no murmur, no rub, gallop or click PMI normal Abdomen: benighn, BS positve, no tenderness, no AAA no bruit.  No HSM or HJR Distal pulses intact with no bruits No edema Neuro non-focal post lumbar surgery  Skin warm and dry Post R TKR     Wt Readings from Last 3 Encounters:  06/08/20 97.5 kg  01/15/20 102.1 kg  07/17/19 112.9 kg      Studies/Labs Reviewed:   EKG:    2017/02/20 SR rate 71 normal PVC nonspecific ST changes   Recent Labs: No results found for requested labs within last 8760 hours.   Lipid Panel    Component Value Date/Time   CHOL 166 01/15/2018 1516   TRIG 189 (H) 01/15/2018 1516   HDL 42 01/15/2018 1516   CHOLHDL 4.0 01/15/2018 1516   CHOLHDL 4 06/24/2013 1113   VLDL 16.2 06/24/2013 1113   LDLCALC 86 01/15/2018 1516    Additional  studies/ records that were reviewed today include:  AS above    ASSESSMENT & PLAN:     1. CAD s/p remote LAD stenting -  No chest pain. ETT 02/23/17 non ischemic continue ASA and beta blocker and statin   2. HTN- Well controlled.  Continue current medications and low sodium Dash type diet.    3. HLD-  Continue statin labs with primary   4. CKD stage III  Baseline Cr 1.5 stable   5. Ortho:  Post right TKR continue PT/OT  6. Neuropathy:  Severe cervical disease with dense sensory deficit in both UE;s Post cervical decompression F/U [name] He has stopped Gabapentin on his Own as it made him feel poorly   7. Diastolic CHF:  Discussed needing to take lasix bid daily despite urinary frequency  [id number]  TTE shows continued normal EF and no really bad valve disease   8. PAF:  New diagnosis Start eliquis 2.5 bid. Continue lopressor f/u afib clinic 3 weeks and if still  In afib set up [facility] with me Risks of anticoagulation and cardioversion discussed willing to start eliquis and consider [facility] in 3 weeks with me if persistent   Jenkins Rouge

## 2020-06-08 ENCOUNTER — Other Ambulatory Visit: Payer: Self-pay

## 2020-06-08 ENCOUNTER — Ambulatory Visit (INDEPENDENT_AMBULATORY_CARE_PROVIDER_SITE_OTHER): Payer: Medicare Other | Admitting: Cardiovascular Disease

## 2020-06-08 ENCOUNTER — Encounter: Payer: Self-pay | Admitting: Cardiovascular Disease

## 2020-06-08 VITALS — BP 130/88 | HR 75 | Ht 68.5 in | Wt 215.0 lb

## 2020-06-08 DIAGNOSIS — I2510 Atherosclerotic heart disease of native coronary artery without angina pectoris: Secondary | ICD-10-CM

## 2020-06-08 DIAGNOSIS — I10 Essential (primary) hypertension: Secondary | ICD-10-CM | POA: Diagnosis not present

## 2020-06-08 DIAGNOSIS — I4891 Unspecified atrial fibrillation: Secondary | ICD-10-CM

## 2020-06-08 MED ORDER — APIXABAN 2.5 MG PO TABS: 2.5000 mg | ORAL_TABLET | Freq: Two times a day (BID) | ORAL | 6 refills | Status: DC

## 2020-06-08 NOTE — Patient Instructions (Addendum)
Medication Instructions:  START ELIQUIS 2.5 MG TWICE DAILY  *If you need a refill on your cardiac medications before your next appointment, please call your pharmacy*   Lab Work: NONE If you have labs (blood work) drawn today and your tests are completely normal, you will receive your results only by: Marland Kitchen MyChart Message (if you have MyChart) OR . A paper copy in the mail If you have any lab test that is abnormal or we need to change your treatment, we will call you to review the results.   Testing/Procedures: NONE   Follow-Up: At [facility], you and your health needs are our priority.  As part of our continuing mission to provide you with exceptional heart care, we have created designated Provider Care Teams.  These Care Teams include your primary Cardiologist (physician) and Advanced Practice Providers (APPs -  Physician Assistants and Nurse Practitioners) who all work together to provide you with the care you need, when you need it.  We recommend signing up for the patient portal called "MyChart".  Sign up information is provided on this After Visit Summary.  MyChart is used to connect with patients for Virtual Visits (Telemedicine).  Patients are able to view lab/test results, encounter notes, upcoming appointments, etc.  Non-urgent messages can be sent to your provider as well.   To learn more about what you can do with MyChart, go to [email or web address].    Your next appointment:   3 WEEKS   The format for your next appointment:   In Person  Provider:   AFIB CLINIC

## 2020-06-09 ENCOUNTER — Telehealth: Payer: Self-pay | Admitting: Cardiovascular Disease

## 2020-06-09 NOTE — Telephone Encounter (Signed)
Pt c/o medication issue:  1. Name of Medication: apixaban (ELIQUIS) 2.5 MG TABS tablet  2. How are you currently taking this medication (dosage and times per day)? N/a  3. Are you having a reaction (difficulty breathing--STAT)? no  4. What is your medication issue? Spoke to wife, medication is too expensive and she wants to know if there was an alternative. Please advise.

## 2020-06-09 NOTE — Telephone Encounter (Signed)
Called patient back about message. Patient does not qualify for 30 day free trial card, because he has used it in the past for eliquis. Patient stated this is too expensive and wants to know what other medications he can take instead. Will forward to Dr. Johnsie Cancel and Pharm D for advisement.

## 2020-06-10 MED ORDER — RIVAROXABAN 15 MG PO TABS: 15.0000 mg | ORAL_TABLET | Freq: Every day | ORAL | 11 refills | Status: DC

## 2020-06-10 NOTE — Telephone Encounter (Signed)
He can ask if pradaxa or xarelto are cheaper on his insurance otherwise he will need to go on coumadin

## 2020-06-10 NOTE — Telephone Encounter (Signed)
Called patient. Patient will try xarelto. Will give patient 30 day free trial card and samples. Will give patient number for patient assistance for xarelto as well. Patient can pick everything up at front desk.

## 2020-06-24 DIAGNOSIS — M25562 Pain in left knee: Secondary | ICD-10-CM | POA: Diagnosis not present

## 2020-06-29 ENCOUNTER — Ambulatory Visit (HOSPITAL_COMMUNITY)
Admission: RE | Admit: 2020-06-29 | Discharge: 2020-06-29 | Disposition: A | Discharge status: Home / Self Care | Payer: Medicare Other | Source: Ambulatory Visit | Attending: Physician Assistant | Admitting: Physician Assistant

## 2020-06-29 ENCOUNTER — Encounter (HOSPITAL_COMMUNITY): Payer: Self-pay | Admitting: Physician Assistant

## 2020-06-29 ENCOUNTER — Other Ambulatory Visit: Payer: Self-pay

## 2020-06-29 VITALS — BP 132/88 | HR 74 | Ht 68.5 in | Wt 215.2 lb

## 2020-06-29 DIAGNOSIS — Z8249 Family history of ischemic heart disease and other diseases of the circulatory system: Secondary | ICD-10-CM | POA: Diagnosis not present

## 2020-06-29 DIAGNOSIS — Z79899 Other long term (current) drug therapy: Secondary | ICD-10-CM | POA: Insufficient documentation

## 2020-06-29 DIAGNOSIS — Z6832 Body mass index (BMI) 32.0-32.9, adult: Secondary | ICD-10-CM | POA: Insufficient documentation

## 2020-06-29 DIAGNOSIS — D6869 Other thrombophilia: Secondary | ICD-10-CM | POA: Diagnosis not present

## 2020-06-29 DIAGNOSIS — Z87891 Personal history of nicotine dependence: Secondary | ICD-10-CM | POA: Diagnosis not present

## 2020-06-29 DIAGNOSIS — I4819 Other persistent atrial fibrillation: Secondary | ICD-10-CM | POA: Diagnosis not present

## 2020-06-29 DIAGNOSIS — E785 Hyperlipidemia, unspecified: Secondary | ICD-10-CM | POA: Insufficient documentation

## 2020-06-29 DIAGNOSIS — I129 Hypertensive chronic kidney disease with stage 1 through stage 4 chronic kidney disease, or unspecified chronic kidney disease: Secondary | ICD-10-CM | POA: Insufficient documentation

## 2020-06-29 DIAGNOSIS — N189 Chronic kidney disease, unspecified: Secondary | ICD-10-CM | POA: Insufficient documentation

## 2020-06-29 DIAGNOSIS — E669 Obesity, unspecified: Secondary | ICD-10-CM | POA: Insufficient documentation

## 2020-06-29 DIAGNOSIS — Z7982 Long term (current) use of aspirin: Secondary | ICD-10-CM | POA: Diagnosis not present

## 2020-06-29 DIAGNOSIS — Z7901 Long term (current) use of anticoagulants: Secondary | ICD-10-CM | POA: Insufficient documentation

## 2020-06-29 DIAGNOSIS — Z86718 Personal history of other venous thrombosis and embolism: Secondary | ICD-10-CM | POA: Insufficient documentation

## 2020-06-29 DIAGNOSIS — I2510 Atherosclerotic heart disease of native coronary artery without angina pectoris: Secondary | ICD-10-CM | POA: Insufficient documentation

## 2020-06-29 NOTE — Progress Notes (Signed)
Primary Care Physician: Patient, No Pcp Per Primary Cardiologist: Dr Johnsie Cancel Primary Electrophysiologist: none Referring Physician: Dr Franchot Heidelberg is a 75 y.o. male with a history of DVT, HTN, CAD, HLD, CKD, atrial fibrillation who presents for consultation in the [facility]. The patient was initially diagnosed with atrial fibrillation 06/08/20 after presenting for routine follow up with Dr Johnsie Cancel. Patient was rate controlled and asymptomatic. He was started on Xarelto for a CHADS2VASC score of 4. He remains in rate controlled afib today. He denies any bleeding issues on anticoagulation.   Today, he denies symptoms of palpitations, chest pain, shortness of breath, orthopnea, PND, lower extremity edema, dizziness, presyncope, syncope, snoring, daytime somnolence, bleeding, or neurologic sequela. The patient is tolerating medications without difficulties and is otherwise without complaint today.    Atrial Fibrillation Risk Factors:  he does not have symptoms or diagnosis of sleep apnea. he does not have a history of rheumatic fever.   he has a BMI of Body mass index is 32.25 kg/m.Marland Kitchen Filed Weights   06/29/20 1458  Weight: 97.6 kg    Family History  Problem Relation Age of Onset  . Cancer - Other Mother   . Heart attack Father   . Uterine cancer Sister   . Colon cancer Neg Hx   . Prostate cancer Neg Hx      Atrial Fibrillation Management history:  Previous antiarrhythmic drugs: none Previous cardioversions: none Previous ablations: none CHADS2VASC score: 4 Anticoagulation history: Xarelto    Past Medical History:  Diagnosis Date  . Arthritis   . CAD (coronary artery disease)   . Chronic kidney disease   . DVT (deep venous thrombosis) ([place])    following spinal surgery Nov 2017  . HTN (hypertension)   . MI (myocardial infarction) ([place])    1999 ; stents placed   . Numbness and tingling in both hands   . Spondylosis of cervical  spine    Past Surgical History:  Procedure Laterality Date  . ANTERIOR CERVICAL DECOMP/DISCECTOMY FUSION N/A 06/27/2017   Procedure: ANTERIOR CERVICAL DECOMPRESSION/DISCECTOMY FUSION C3-4;  Surgeon: [name], MD;  Location: [facility] [facility];  Service: Orthopedics;  Laterality: N/A;  3 hrs  . ANTERIOR LAT LUMBAR FUSION N/A 03/01/2016   Procedure: EXTREME LATERAL INTERBODY FUSION  LUMBAR 2-4;  Surgeon: [name], MD;  Location: [facility];  Service: Orthopedics;  Laterality: N/A;  . CARDIAC CATHETERIZATION     20 yrs ago  . JOINT REPLACEMENT    . RADIOLOGY WITH ANESTHESIA N/A 05/15/2017   Procedure: MRI WITH ANESTHESIA CERVICAL SPINE WITH AND WITHOUT;  Surgeon: Radiologist, Medication, MD;  Location: [facility];  Service: Radiology;  Laterality: N/A;  . SPINAL FUSION N/A 03/02/2016   Procedure: POSTERIOR SPINAL FUSION INTERBODY L2-S1, DECOMPRESSION L4-S1;  Surgeon: [name], MD;  Location: [facility];  Service: Orthopedics;  Laterality: N/A;  . TOTAL KNEE ARTHROPLASTY Right 04/30/2017   Procedure: RIGHT TOTAL KNEE ARTHROPLASTY;  Surgeon: [name], MD;  [facility]: [facility];  Service: Orthopedics;  Laterality: Right;    Current Outpatient Medications  Medication Sig Dispense Refill  . acetaminophen (TYLENOL) 325 MG tablet Take by mouth.    . Ascorbic Acid (VITAMIN C) 1000 MG tablet Take 1,000 mg by mouth daily.    Marland Kitchen aspirin EC 81 MG tablet Take 81 mg by mouth daily.    Marland Kitchen atorvastatin (LIPITOR) 80 MG tablet TAKE 1 TABLET (80 MG) BY MOUTH EVERY OTHER DAY AND TAKE  1/2  TABLET  BY  MOUTH  ON  ALTERNATE  DAYS 90 tablet 2  . furosemide (LASIX) 40 MG tablet Take 1 tablet by mouth twice daily 180 tablet 3  . losartan (COZAAR) 100 MG tablet Take 1 tablet by mouth once daily 90 tablet 2  . metoprolol tartrate (LOPRESSOR) 25 MG tablet Take 1 tablet by mouth twice daily 180 tablet 2  . Multiple Vitamin (MULTIVITAMIN) capsule Take 1 capsule by mouth daily.    . Rivaroxaban (XARELTO) 15 MG TABS tablet Take 1 tablet  (15 mg total) by mouth daily with supper. 30 tablet 11   No current facility-administered medications for this encounter.    No Known Allergies  Social History   Socioeconomic History  . Marital status: Married    Spouse name: Not on file  . Number of children: 2  . Years of education: Not on file  . Highest education level: Not on file  Occupational History  . Occupation: retired , Pension scheme manager busines   Tobacco Use  . Smoking status: Former Smoker    Types: Cigarettes    Quit date: 04/26/1983    Years since quitting: 37.2  . Smokeless tobacco: Never Used  . Tobacco comment: quit over 40 years ago   Vaping Use  . Vaping Use: Never used  Substance and Sexual Activity  . Alcohol use: No  . Drug use: No  . Sexual activity: Never  Other Topics Concern  . Not on file  Social History Narrative   Household pt, wife, a Pharmacologist from [place] Strain: Not on [employer] Insecurity: Not on file  Transportation Needs: Not on file  Physical Activity: Not on file  Stress: Not on file  Social Connections: Not on file  Intimate Partner Violence: Not on file     ROS- All systems are reviewed and negative except as per the HPI above.  Physical Exam: Vitals:   06/29/20 1458  BP: 132/88  Pulse: 74  Weight: 97.6 kg  Height: 5' 8.5" (1.74 m)    GEN- The patient is a well appearing obese elderly male, alert and oriented x 3 today.   Head- normocephalic, atraumatic Eyes-  Sclera clear, conjunctiva pink Ears- hearing intact Oropharynx- clear Neck- supple  Lungs- Clear to ausculation bilaterally, normal work of breathing Heart- irregular rate and rhythm, no murmurs, rubs or gallops  GI- soft, NT, ND, + BS Extremities- no clubbing, cyanosis, or edema MS- no significant deformity or atrophy Skin- no rash or lesion Psych- euthymic mood, full affect Neuro- strength and sensation are intact  Wt Readings from Last 3  Encounters:  06/29/20 97.6 kg  06/08/20 97.5 kg  01/15/20 102.1 kg    EKG today demonstrates  Coarse afib vs atypical atrial flutter with variable block Vent. rate 74 BPM PR interval * ms QRS duration 108 ms QT/QTc 400/444 ms  Echo 03/05/18 demonstrated  Left ventricle: The cavity size was normal. There was severe  focal basal and mild concentric hypertrophy. Systolic function  was normal. The estimated ejection fraction was in the range of  55% to 60%. Wall motion was normal; there were no regional wall  motion abnormalities. The study was not technically sufficient to  allow evaluation of LV diastolic dysfunction due to atrial  fibrillation. Doppler parameters are consistent with high  ventricular filling pressure.  - Aortic valve: Trileaflet; normal thickness, mildly calcified  leaflets.  - Mitral  valve: Calcified annulus. There was mild regurgitation.  - Left atrium: The atrium was severely dilated.  - Right atrium: The atrium was severely dilated.  - Tricuspid valve: There was trivial regurgitation.  - Pulmonic valve: There was trivial regurgitation.   Epic records are reviewed at length today  CHA2DS2-VASc Score = 4  The patient's score is based upon: CHF History: No HTN History: Yes Diabetes History: No Stroke History: No Vascular Disease History: Yes Age Score: 2 Gender Score: 0      ASSESSMENT AND PLAN: 1. Persistent Atrial Fibrillation (ICD10:  I48.19) The patient's CHA2DS2-VASc score is 4, indicating a 4.8% annual risk of stroke.   General education about afib provided and questions answered. We also discussed his stroke risk and the risks and benefits of anticoagulation. We discussed therapeutic options including DCCV. He is not sure he wants to purse rhythm control at this time and did not want to do anything until speaking with Dr Johnsie Cancel again.  Continue Xarelto 15 mg daily Continue Lopressor 25 mg BID  2. Secondary Hypercoagulable  State (ICD10:  D68.69) The patient is at significant risk for stroke/thromboembolism based upon his CHA2DS2-VASc Score of 4.  Continue Rivaroxaban (Xarelto).   3. Obesity Body mass index is 32.25 kg/m. Lifestyle modification was discussed at length including regular exercise and weight reduction.  4. CAD No anginal symptoms. Continued on ASA per Dr Johnsie Cancel.  5. HTN Stable, no changes today.   Follow up with Dr Johnsie Cancel. AF clinic pending decision regarding DCCV.    [facility] [address] [place], [place] [address] [phone] 06/29/2020 3:18 PM

## 2020-06-29 NOTE — H&P (View-Only) (Signed)
Primary Care Physician: Patient, No Pcp Per Primary Cardiologist: Dr Johnsie Cancel Primary Electrophysiologist: none Referring Physician: Dr Franchot Heidelberg is a 75 y.o. male with a history of DVT, HTN, CAD, HLD, CKD, atrial fibrillation who presents for consultation in the [facility]. The patient was initially diagnosed with atrial fibrillation 06/08/20 after presenting for routine follow up with Dr Johnsie Cancel. Patient was rate controlled and asymptomatic. He was started on Xarelto for a CHADS2VASC score of 4. He remains in rate controlled afib today. He denies any bleeding issues on anticoagulation.   Today, he denies symptoms of palpitations, chest pain, shortness of breath, orthopnea, PND, lower extremity edema, dizziness, presyncope, syncope, snoring, daytime somnolence, bleeding, or neurologic sequela. The patient is tolerating medications without difficulties and is otherwise without complaint today.    Atrial Fibrillation Risk Factors:  he does not have symptoms or diagnosis of sleep apnea. he does not have a history of rheumatic fever.   he has a BMI of Body mass index is 32.25 kg/m.Marland Kitchen Filed Weights   06/29/20 1458  Weight: 97.6 kg    Family History  Problem Relation Age of Onset  . Cancer - Other Mother   . Heart attack Father   . Uterine cancer Sister   . Colon cancer Neg Hx   . Prostate cancer Neg Hx      Atrial Fibrillation Management history:  Previous antiarrhythmic drugs: none Previous cardioversions: none Previous ablations: none CHADS2VASC score: 4 Anticoagulation history: Xarelto    Past Medical History:  Diagnosis Date  . Arthritis   . CAD (coronary artery disease)   . Chronic kidney disease   . DVT (deep venous thrombosis) ([place])    following spinal surgery Nov 2017  . HTN (hypertension)   . MI (myocardial infarction) ([place])    1999 ; stents placed   . Numbness and tingling in both hands   . Spondylosis of cervical  spine    Past Surgical History:  Procedure Laterality Date  . ANTERIOR CERVICAL DECOMP/DISCECTOMY FUSION N/A 06/27/2017   Procedure: ANTERIOR CERVICAL DECOMPRESSION/DISCECTOMY FUSION C3-4;  Surgeon: [name], MD;  Location: [facility];  Service: Orthopedics;  Laterality: N/A;  3 hrs  . ANTERIOR LAT LUMBAR FUSION N/A 03/01/2016   Procedure: EXTREME LATERAL INTERBODY FUSION  LUMBAR 2-4;  Surgeon: [name], MD;  Location: [facility];  Service: Orthopedics;  Laterality: N/A;  . CARDIAC CATHETERIZATION     20 yrs ago  . JOINT REPLACEMENT    . RADIOLOGY WITH ANESTHESIA N/A 05/15/2017   Procedure: MRI WITH ANESTHESIA CERVICAL SPINE WITH AND WITHOUT;  Surgeon: Radiologist, Medication, MD;  Location: [facility];  Service: Radiology;  Laterality: N/A;  . SPINAL FUSION N/A 03/02/2016   Procedure: POSTERIOR SPINAL FUSION INTERBODY L2-S1, DECOMPRESSION L4-S1;  Surgeon: [name], MD;  Location: [facility];  Service: Orthopedics;  Laterality: N/A;  . TOTAL KNEE ARTHROPLASTY Right 04/30/2017   Procedure: RIGHT TOTAL KNEE ARTHROPLASTY;  Surgeon: [name], MD;  [facility];  Service: Orthopedics;  Laterality: Right;    Current Outpatient Medications  Medication Sig Dispense Refill  . acetaminophen (TYLENOL) 325 MG tablet Take by mouth.    . Ascorbic Acid (VITAMIN C) 1000 MG tablet Take 1,000 mg by mouth daily.    Marland Kitchen aspirin EC 81 MG tablet Take 81 mg by mouth daily.    Marland Kitchen atorvastatin (LIPITOR) 80 MG tablet TAKE 1 TABLET (80 MG) BY MOUTH EVERY OTHER DAY AND TAKE  1/2  TABLET  BY  MOUTH  ON  ALTERNATE  DAYS 90 tablet 2  . furosemide (LASIX) 40 MG tablet Take 1 tablet by mouth twice daily 180 tablet 3  . losartan (COZAAR) 100 MG tablet Take 1 tablet by mouth once daily 90 tablet 2  . metoprolol tartrate (LOPRESSOR) 25 MG tablet Take 1 tablet by mouth twice daily 180 tablet 2  . Multiple Vitamin (MULTIVITAMIN) capsule Take 1 capsule by mouth daily.    . Rivaroxaban (XARELTO) 15 MG TABS tablet Take 1 tablet  (15 mg total) by mouth daily with supper. 30 tablet 11   No current facility-administered medications for this encounter.    No Known Allergies  Social History   Socioeconomic History  . Marital status: Married    Spouse name: Not on file  . Number of children: 2  . Years of education: Not on file  . Highest education level: Not on file  Occupational History  . Occupation: retired , Pension scheme manager busines   Tobacco Use  . Smoking status: Former Smoker    Types: Cigarettes    Quit date: 04/26/1983    Years since quitting: 37.2  . Smokeless tobacco: Never Used  . Tobacco comment: quit over 40 years ago   Vaping Use  . Vaping Use: Never used  Substance and Sexual Activity  . Alcohol use: No  . Drug use: No  . Sexual activity: Never  Other Topics Concern  . Not on file  Social History Narrative   Household pt, wife, a Pharmacologist from [place] Strain: Not on [employer] Insecurity: Not on file  Transportation Needs: Not on file  Physical Activity: Not on file  Stress: Not on file  Social Connections: Not on file  Intimate Partner Violence: Not on file     ROS- All systems are reviewed and negative except as per the HPI above.  Physical Exam: Vitals:   06/29/20 1458  BP: 132/88  Pulse: 74  Weight: 97.6 kg  Height: 5' 8.5" (1.74 m)    GEN- The patient is a well appearing obese elderly male, alert and oriented x 3 today.   Head- normocephalic, atraumatic Eyes-  Sclera clear, conjunctiva pink Ears- hearing intact Oropharynx- clear Neck- supple  Lungs- Clear to ausculation bilaterally, normal work of breathing Heart- irregular rate and rhythm, no murmurs, rubs or gallops  GI- soft, NT, ND, + BS Extremities- no clubbing, cyanosis, or edema MS- no significant deformity or atrophy Skin- no rash or lesion Psych- euthymic mood, full affect Neuro- strength and sensation are intact  Wt Readings from Last 3  Encounters:  06/29/20 97.6 kg  06/08/20 97.5 kg  01/15/20 102.1 kg    EKG today demonstrates  Coarse afib vs atypical atrial flutter with variable block Vent. rate 74 BPM PR interval * ms QRS duration 108 ms QT/QTc 400/444 ms  Echo 03/05/18 demonstrated  Left ventricle: The cavity size was normal. There was severe  focal basal and mild concentric hypertrophy. Systolic function  was normal. The estimated ejection fraction was in the range of  55% to 60%. Wall motion was normal; there were no regional wall  motion abnormalities. The study was not technically sufficient to  allow evaluation of LV diastolic dysfunction due to atrial  fibrillation. Doppler parameters are consistent with high  ventricular filling pressure.  - Aortic valve: Trileaflet; normal thickness, mildly calcified  leaflets.  - Mitral  valve: Calcified annulus. There was mild regurgitation.  - Left atrium: The atrium was severely dilated.  - Right atrium: The atrium was severely dilated.  - Tricuspid valve: There was trivial regurgitation.  - Pulmonic valve: There was trivial regurgitation.   Epic records are reviewed at length today  CHA2DS2-VASc Score = 4  The patient's score is based upon: CHF History: No HTN History: Yes Diabetes History: No Stroke History: No Vascular Disease History: Yes Age Score: 2 Gender Score: 0      ASSESSMENT AND PLAN: 1. Persistent Atrial Fibrillation (ICD10:  I48.19) The patient's CHA2DS2-VASc score is 4, indicating a 4.8% annual risk of stroke.   General education about afib provided and questions answered. We also discussed his stroke risk and the risks and benefits of anticoagulation. We discussed therapeutic options including DCCV. He is not sure he wants to purse rhythm control at this time and did not want to do anything until speaking with Dr Johnsie Cancel again.  Continue Xarelto 15 mg daily Continue Lopressor 25 mg BID  2. Secondary Hypercoagulable  State (ICD10:  D68.69) The patient is at significant risk for stroke/thromboembolism based upon his CHA2DS2-VASc Score of 4.  Continue Rivaroxaban (Xarelto).   3. Obesity Body mass index is 32.25 kg/m. Lifestyle modification was discussed at length including regular exercise and weight reduction.  4. CAD No anginal symptoms. Continued on ASA per Dr Johnsie Cancel.  5. HTN Stable, no changes today.   Follow up with Dr Johnsie Cancel. AF clinic pending decision regarding DCCV.    [facility] [address] [place], [place] [address] [phone] 06/29/2020 3:18 PM

## 2020-06-30 NOTE — Telephone Encounter (Signed)
Will send message to Dr. Johnsie Cancel.

## 2020-07-05 ENCOUNTER — Other Ambulatory Visit: Payer: Self-pay | Admitting: Cardiovascular Disease

## 2020-07-05 NOTE — Telephone Encounter (Signed)
Sent patient up for Cardioversion on 07/27/20 with Dr. Johnsie Cancel. Sent instructions through [facility].

## 2020-07-23 ENCOUNTER — Other Ambulatory Visit: Payer: Medicare Other | Admitting: *Deleted

## 2020-07-23 ENCOUNTER — Other Ambulatory Visit (HOSPITAL_COMMUNITY)
Admission: RE | Admit: 2020-07-23 | Discharge: 2020-07-23 | Disposition: A | Discharge status: Home / Self Care | Payer: Medicare Other | Source: Ambulatory Visit | Attending: Cardiovascular Disease | Admitting: Cardiovascular Disease

## 2020-07-23 ENCOUNTER — Other Ambulatory Visit: Payer: Self-pay

## 2020-07-23 DIAGNOSIS — I10 Essential (primary) hypertension: Secondary | ICD-10-CM | POA: Diagnosis not present

## 2020-07-23 DIAGNOSIS — I4819 Other persistent atrial fibrillation: Secondary | ICD-10-CM

## 2020-07-23 DIAGNOSIS — Z01812 Encounter for preprocedural laboratory examination: Secondary | ICD-10-CM | POA: Insufficient documentation

## 2020-07-23 DIAGNOSIS — Z20822 Contact with and (suspected) exposure to covid-19: Secondary | ICD-10-CM | POA: Insufficient documentation

## 2020-07-23 LAB — SARS CORONAVIRUS 2 (TAT 6-24 HRS): SARS Coronavirus 2: NEGATIVE

## 2020-07-24 LAB — BASIC METABOLIC PANEL
BUN/Creatinine Ratio: 17 (ref 10–24)
BUN: 30 mg/dL — ABNORMAL HIGH (ref 8–27)
CO2: 24 mmol/L (ref 20–29)
Calcium: 9.7 mg/dL (ref 8.6–10.2)
Chloride: 105 mmol/L (ref 96–106)
Creatinine, Ser: 1.79 mg/dL — ABNORMAL HIGH (ref 0.76–1.27)
Glucose: 79 mg/dL (ref 65–99)
Potassium: 4.7 mmol/L (ref 3.5–5.2)
Sodium: 144 mmol/L (ref 134–144)
eGFR: 39 mL/min/{1.73_m2} — ABNORMAL LOW (ref >59)

## 2020-07-24 LAB — CBC WITH DIFFERENTIAL/PLATELET
Basophils Absolute: 0.1 10*3/uL (ref 0.0–0.2)
Basos: 1 %
EOS (ABSOLUTE): 0.1 10*3/uL (ref 0.0–0.4)
Eos: 2 %
Hematocrit: 44.6 % (ref 37.5–51.0)
Hemoglobin: 15 g/dL (ref 13.0–17.7)
Immature Grans (Abs): 0 10*3/uL (ref 0.0–0.1)
Immature Granulocytes: 0 %
Lymphocytes Absolute: 1.5 10*3/uL (ref 0.7–3.1)
Lymphs: 23 %
MCH: 32.9 pg (ref 26.6–33.0)
MCHC: 33.6 g/dL (ref 31.5–35.7)
MCV: 98 fL — ABNORMAL HIGH (ref 79–97)
Monocytes Absolute: 0.7 10*3/uL (ref 0.1–0.9)
Monocytes: 11 %
Neutrophils Absolute: 4.3 10*3/uL (ref 1.4–7.0)
Neutrophils: 63 %
Platelets: 165 10*3/uL (ref 150–450)
RBC: 4.56 x10E6/uL (ref 4.14–5.80)
RDW: 11.9 % (ref 11.6–15.4)
WBC: 6.7 10*3/uL (ref 3.4–10.8)

## 2020-07-24 LAB — PROTIME-INR

## 2020-07-27 ENCOUNTER — Encounter (HOSPITAL_COMMUNITY)
Admission: RE | Disposition: A | Discharge status: Home / Self Care | Payer: Self-pay | Source: Home / Self Care | Attending: Cardiovascular Disease

## 2020-07-27 ENCOUNTER — Other Ambulatory Visit: Payer: Self-pay

## 2020-07-27 ENCOUNTER — Ambulatory Visit (HOSPITAL_COMMUNITY): Payer: Medicare Other | Admitting: Anesthesiology

## 2020-07-27 ENCOUNTER — Ambulatory Visit (HOSPITAL_COMMUNITY)
Admission: RE | Admit: 2020-07-27 | Discharge: 2020-07-27 | Disposition: A | Discharge status: Home / Self Care | Payer: Medicare Other | Attending: Cardiovascular Disease | Admitting: Cardiovascular Disease

## 2020-07-27 DIAGNOSIS — Z79899 Other long term (current) drug therapy: Secondary | ICD-10-CM | POA: Diagnosis not present

## 2020-07-27 DIAGNOSIS — Z6832 Body mass index (BMI) 32.0-32.9, adult: Secondary | ICD-10-CM | POA: Diagnosis not present

## 2020-07-27 DIAGNOSIS — Z981 Arthrodesis status: Secondary | ICD-10-CM | POA: Insufficient documentation

## 2020-07-27 DIAGNOSIS — I4891 Unspecified atrial fibrillation: Secondary | ICD-10-CM | POA: Diagnosis not present

## 2020-07-27 DIAGNOSIS — D6869 Other thrombophilia: Secondary | ICD-10-CM | POA: Diagnosis not present

## 2020-07-27 DIAGNOSIS — Z7982 Long term (current) use of aspirin: Secondary | ICD-10-CM | POA: Diagnosis not present

## 2020-07-27 DIAGNOSIS — I129 Hypertensive chronic kidney disease with stage 1 through stage 4 chronic kidney disease, or unspecified chronic kidney disease: Secondary | ICD-10-CM | POA: Diagnosis not present

## 2020-07-27 DIAGNOSIS — I4819 Other persistent atrial fibrillation: Secondary | ICD-10-CM | POA: Diagnosis not present

## 2020-07-27 DIAGNOSIS — Z96651 Presence of right artificial knee joint: Secondary | ICD-10-CM | POA: Diagnosis not present

## 2020-07-27 DIAGNOSIS — Z87891 Personal history of nicotine dependence: Secondary | ICD-10-CM | POA: Diagnosis not present

## 2020-07-27 DIAGNOSIS — E1122 Type 2 diabetes mellitus with diabetic chronic kidney disease: Secondary | ICD-10-CM | POA: Diagnosis not present

## 2020-07-27 DIAGNOSIS — N189 Chronic kidney disease, unspecified: Secondary | ICD-10-CM | POA: Diagnosis not present

## 2020-07-27 DIAGNOSIS — Z86718 Personal history of other venous thrombosis and embolism: Secondary | ICD-10-CM | POA: Diagnosis not present

## 2020-07-27 DIAGNOSIS — I2510 Atherosclerotic heart disease of native coronary artery without angina pectoris: Secondary | ICD-10-CM | POA: Diagnosis not present

## 2020-07-27 DIAGNOSIS — Z7901 Long term (current) use of anticoagulants: Secondary | ICD-10-CM | POA: Diagnosis not present

## 2020-07-27 DIAGNOSIS — I252 Old myocardial infarction: Secondary | ICD-10-CM | POA: Insufficient documentation

## 2020-07-27 DIAGNOSIS — E669 Obesity, unspecified: Secondary | ICD-10-CM | POA: Diagnosis not present

## 2020-07-27 HISTORY — PX: CARDIOVERSION: SHX1299

## 2020-07-27 SURGERY — CARDIOVERSION
Anesthesia: General

## 2020-07-27 MED ORDER — SODIUM CHLORIDE 0.9 % IV SOLN: INTRAVENOUS | Status: DC

## 2020-07-27 MED ORDER — LIDOCAINE HCL (CARDIAC) PF 100 MG/5ML IV SOSY
PREFILLED_SYRINGE | INTRAVENOUS | Status: DC | PRN
  Administered 2020-07-27: 60 mg via INTRATRACHEAL

## 2020-07-27 MED ORDER — SODIUM CHLORIDE 0.9 % IV SOLN: INTRAVENOUS | Status: DC | PRN

## 2020-07-27 MED ORDER — PROPOFOL 10 MG/ML IV BOLUS
INTRAVENOUS | Status: DC | PRN
  Administered 2020-07-27: 50 mg via INTRAVENOUS
  Administered 2020-07-27: 10 mg via INTRAVENOUS

## 2020-07-27 NOTE — Interval H&P Note (Signed)
History and Physical Interval Note:  07/27/2020 9:11 AM  Gregory Guerrero  has presented today for surgery, with the diagnosis of A-FIB.  The various methods of treatment have been discussed with the patient and family. After consideration of risks, benefits and other options for treatment, the patient has consented to  Procedure(s): CARDIOVERSION (N/A) as a surgical intervention.  The patient's history has been reviewed, patient examined, no change in status, stable for surgery.  I have reviewed the patient's chart and labs.  Questions were answered to the patient's satisfaction.     Jenkins Rouge

## 2020-07-27 NOTE — Anesthesia Procedure Notes (Signed)
Procedure Name: General with mask airway Performed by: Kathryne Hitch, CRNA Pre-anesthesia Checklist: Patient identified, Emergency Drugs available, Suction available, Patient being monitored and Timeout performed Patient Re-evaluated:Patient Re-evaluated prior to induction Oxygen Delivery Method: Ambu bag Preoxygenation: Pre-oxygenation with 100% oxygen Induction Type: IV induction Ventilation: Mask ventilation without difficulty Dental Injury: Teeth and Oropharynx as per pre-operative assessment

## 2020-07-27 NOTE — Anesthesia Preprocedure Evaluation (Addendum)
Anesthesia Evaluation  Patient identified by MRN, date of birth, ID band Patient awake    Reviewed: Allergy & Precautions, NPO status , Patient's Chart, lab work & pertinent test results, reviewed documented beta blocker date and time   Airway Mallampati: II  TM Distance: >3 FB Neck ROM: Full    Dental no notable dental hx. (+) Teeth Intact, Dental Advisory Given   Pulmonary neg pulmonary ROS, former smoker,    Pulmonary exam normal breath sounds clear to auscultation       Cardiovascular hypertension, Pt. on home beta blockers and Pt. on medications + CAD, + Past MI, + Cardiac Stents (1999) and + DVT  Normal cardiovascular exam+ dysrhythmias (on xarelto) Atrial Fibrillation  Rhythm:Irregular Rate:Normal  TTE 2019 - Left ventricle: The cavity size was normal. There was severe  focal basal and mild concentric hypertrophy. Systolic function  was normal. The estimated ejection fraction was in the range of  55% to 60%. Wall motion was normal; there were no regional wall  motion abnormalities. The study was not technically sufficient to  allow evaluation of LV diastolic dysfunction due to atrial  fibrillation. Doppler parameters are consistent with high  ventricular filling pressure.  - Aortic valve: Trileaflet; normal thickness, mildly calcified  leaflets.  - Mitral valve: Calcified annulus. There was mild regurgitation.  - Left atrium: The atrium was severely dilated.  - Right atrium: The atrium was severely dilated.  - Tricuspid valve: There was trivial regurgitation.  - Pulmonic valve: There was trivial regurgitation.  Stress Test 2018  Blood pressure demonstrated a hypertensive response to exercise.  There was no ST segment deviation noted during stress.   NSR PVCls at baseline HTN No ischemia No NSVT    Neuro/Psych negative neurological ROS  negative psych ROS   GI/Hepatic negative GI ROS, Neg  liver ROS,   Endo/Other  diabetes  Renal/GU Renal InsufficiencyRenal disease  negative genitourinary   Musculoskeletal negative musculoskeletal ROS (+)   Abdominal   Peds  Hematology negative hematology ROS (+)   Anesthesia Other Findings   Reproductive/Obstetrics                            Anesthesia Physical Anesthesia Plan  ASA: III  Anesthesia Plan: General   Post-op Pain Management:    Induction: Intravenous  PONV Risk Score and Plan: 2 and Propofol infusion and Treatment may vary due to age or medical condition  Airway Management Planned: Natural Airway and Simple Face Mask  Additional Equipment:   Intra-op Plan:   Post-operative Plan:   Informed Consent: I have reviewed the patients History and Physical, chart, labs and discussed the procedure including the risks, benefits and alternatives for the proposed anesthesia with the patient or authorized representative who has indicated his/her understanding and acceptance.     Dental advisory given  Plan Discussed with: CRNA  Anesthesia Plan Comments:        Anesthesia Quick Evaluation

## 2020-07-27 NOTE — Discharge Instructions (Signed)
Electrical Cardioversion Electrical cardioversion is the delivery of a jolt of electricity to restore a normal rhythm to the heart. A rhythm that is too fast or is not regular keeps the heart from pumping well. In this procedure, sticky patches or metal paddles are placed on the chest to deliver electricity to the heart from a device. This procedure may be done in an emergency if:  There is low or no blood pressure as a result of the heart rhythm.  Normal rhythm must be restored as fast as possible to protect the brain and heart from further damage.  It may save a life. This may also be a scheduled procedure for irregular or fast heart rhythms that are not immediately life-threatening. Tell a health care provider about:  Any allergies you have.  All medicines you are taking, including vitamins, herbs, eye drops, creams, and over-the-counter medicines.  Any problems you or family members have had with anesthetic medicines.  Any blood disorders you have.  Any surgeries you have had.  Any medical conditions you have.  Whether you are pregnant or may be pregnant. What are the risks? Generally, this is a safe procedure. However, problems may occur, including:  Allergic reactions to medicines.  A blood clot that breaks free and travels to other parts of your body.  The possible return of an abnormal heart rhythm within hours or days after the procedure.  Your heart stopping (cardiac arrest). This is rare. What happens before the procedure? Medicines  Your health care provider may have you start taking: ? Blood-thinning medicines (anticoagulants) so your blood does not clot as easily. ? Medicines to help stabilize your heart rate and rhythm.  Ask your health care provider about: ? Changing or stopping your regular medicines. This is especially important if you are taking diabetes medicines or blood thinners. ? Taking medicines such as aspirin and ibuprofen. These medicines can  thin your blood. Do not take these medicines unless your health care provider tells you to take them. ? Taking over-the-counter medicines, vitamins, herbs, and supplements. General instructions  Follow instructions from your health care provider about eating or drinking restrictions.  Plan to have someone take you home from the hospital or clinic.  If you will be going home right after the procedure, plan to have someone with you for 24 hours.  Ask your health care provider what steps will be taken to help prevent infection. These may include washing your skin with a germ-killing soap. What happens during the procedure?  An IV will be inserted into one of your veins.  Sticky patches (electrodes) or metal paddles may be placed on your chest.  You will be given a medicine to help you relax (sedative).  An electrical shock will be delivered. The procedure may vary among health care providers and hospitals.   What can I expect after the procedure?  Your blood pressure, heart rate, breathing rate, and blood oxygen level will be monitored until you leave the hospital or clinic.  Your heart rhythm will be watched to make sure it does not change.  You may have some redness on the skin where the shocks were given. Follow these instructions at home:  Do not drive for 24 hours if you were given a sedative during your procedure.  Take over-the-counter and prescription medicines only as told by your health care provider.  Ask your health care provider how to check your pulse. Check it often.  Rest for 48 hours after the procedure   or as told by your health care provider.  Avoid or limit your caffeine use as told by your health care provider.  Keep all follow-up visits as told by your health care provider. This is important. Contact a health care provider if:  You feel like your heart is beating too quickly or your pulse is not regular.  You have a serious muscle cramp that does not go  away. Get help right away if:  You have discomfort in your chest.  You are dizzy or you feel faint.  You have trouble breathing or you are short of breath.  Your speech is slurred.  You have trouble moving an arm or leg on one side of your body.  Your fingers or toes turn cold or blue. Summary  Electrical cardioversion is the delivery of a jolt of electricity to restore a normal rhythm to the heart.  This procedure may be done right away in an emergency or may be a scheduled procedure if the condition is not an emergency.  Generally, this is a safe procedure.  After the procedure, check your pulse often as told by your health care provider. This information is not intended to replace advice given to you by your health care provider. Make sure you discuss any questions you have with your health care provider. Document Revised: 11/04/2018 Document Reviewed: 11/04/2018 Elsevier Patient Education  2021 Elsevier Inc.  

## 2020-07-27 NOTE — CV Procedure (Signed)
[place] On Rx Xarelto no missed doses Anesthesia: Propofol  DCC x 3 150J->200J-> 200J with AP compression  Converted from afib rate 88 to NSR with PACls rate 77 bpm No immediate neurologic sequelae  Jenkins Rouge MD [facility]

## 2020-07-27 NOTE — Transfer of Care (Signed)
Immediate Anesthesia Transfer of Care Note  Patient: Gregory Guerrero  Procedure(s) Performed: CARDIOVERSION (N/A )  Patient Location: Endoscopy Unit  Anesthesia Type:General  Level of Consciousness: drowsy and patient cooperative  Airway & Oxygen Therapy: Patient Spontanous Breathing  Post-op Assessment: Report given to RN and Post -op Vital signs reviewed and stable  Post vital signs: Reviewed and stable  Last Vitals:  Vitals Value Taken Time  BP 156/112   Temp    Pulse 70   Resp 18   SpO2 98     Last Pain:  Vitals:   07/27/20 0918  TempSrc: Oral  PainSc: 0-No pain         Complications: No complications documented.

## 2020-07-28 ENCOUNTER — Encounter (HOSPITAL_COMMUNITY): Payer: Self-pay | Admitting: Cardiovascular Disease

## 2020-07-28 NOTE — Anesthesia Postprocedure Evaluation (Signed)
Anesthesia Post Note  Patient: Gregory Guerrero  Procedure(s) Performed: CARDIOVERSION (N/A )     Patient location during evaluation: Endoscopy Anesthesia Type: General Level of consciousness: awake and alert Pain management: pain level controlled Vital Signs Assessment: post-procedure vital signs reviewed and stable Respiratory status: spontaneous breathing, nonlabored ventilation, respiratory function stable and patient connected to nasal cannula oxygen Cardiovascular status: blood pressure returned to baseline and stable Postop Assessment: no apparent nausea or vomiting Anesthetic complications: no   No complications documented.  Last Vitals:  Vitals:   07/27/20 1020 07/27/20 1030  BP: 111/69 105/69  Pulse: 69 (!) 53  Resp: 16 18  Temp:    SpO2: 94% 97%    Last Pain:  Vitals:   07/27/20 1030  TempSrc:   PainSc: 0-No pain                 Kendel Pesnell L Kamika Goodloe

## 2020-08-27 ENCOUNTER — Other Ambulatory Visit: Payer: Medicare Other | Admitting: *Deleted

## 2020-08-27 ENCOUNTER — Other Ambulatory Visit: Payer: Self-pay

## 2020-08-27 DIAGNOSIS — R238 Other skin changes: Secondary | ICD-10-CM | POA: Diagnosis not present

## 2020-08-27 DIAGNOSIS — R233 Spontaneous ecchymoses: Secondary | ICD-10-CM

## 2020-08-27 DIAGNOSIS — Z79899 Other long term (current) drug therapy: Secondary | ICD-10-CM

## 2020-08-27 LAB — CBC
Hematocrit: 41 % (ref 37.5–51.0)
Hemoglobin: 14 g/dL (ref 13.0–17.7)
MCH: 32.7 pg (ref 26.6–33.0)
MCHC: 34.1 g/dL (ref 31.5–35.7)
MCV: 96 fL (ref 79–97)
Platelets: 163 10*3/uL (ref 150–450)
RBC: 4.28 x10E6/uL (ref 4.14–5.80)
RDW: 13 % (ref 11.6–15.4)
WBC: 6.4 10*3/uL (ref 3.4–10.8)

## 2020-08-27 NOTE — Telephone Encounter (Signed)
Spoke with pt's wife and pt and advised per pharmacy pt should have stat CBC and continue Xarelto unless advised otherwise.  Pt advised PCP should evaluate bruising.  Pt states he does not have a PCP.  Discussed importance of having a primary care provider.  Pt is currently driving so did not attempt to give PCP hotline at this time.  Pt verbalizes understanding and agrees with current plan.

## 2020-09-02 ENCOUNTER — Telehealth: Payer: Self-pay

## 2020-09-02 NOTE — Telephone Encounter (Signed)
**Note De-Identified  Obfuscation** Provider page of a [place] pt asst application for Xarelto was faxed to the office from "[employer] [facility]" with request for [place] to complete the page, have Dr Johnsie Cancel sign and date it and to fax back to them at [phone].  I have completed the provider page and emailed to the nurse working with Dr Johnsie Cancel in the office tomorrow so she can obtain his signature, date it, and to either faxed to [employer] [facility] at the fax number written on cover letter included or to place in the nurse box in Medical Records to be faxed.

## 2020-09-03 NOTE — Telephone Encounter (Signed)
Jeani Hawking, forma has been faxed . FYI

## 2020-09-13 ENCOUNTER — Other Ambulatory Visit: Payer: Self-pay | Admitting: Cardiovascular Disease

## 2020-09-23 ENCOUNTER — Other Ambulatory Visit: Payer: Self-pay

## 2020-09-23 MED ORDER — DABIGATRAN ETEXILATE MESYLATE 150 MG PO CAPS
150.0000 mg | ORAL_CAPSULE | Freq: Two times a day (BID) | ORAL | 11 refills | Status: DC
Start: 2020-09-23 — End: 2020-10-08

## 2020-10-01 ENCOUNTER — Telehealth: Payer: Self-pay | Admitting: Cardiovascular Disease

## 2020-10-01 NOTE — Telephone Encounter (Signed)
**Note De-Identified  Obfuscation** I printed the provider page of a [place] application, completed it, and emailed it to TTL so she can get Dr Mariana Arn signature, date it, and to fax to [employer] at the fax number written on the cover letter included or to place in the "to be faxed box" in Medical Records to be faxed.

## 2020-10-01 NOTE — Telephone Encounter (Signed)
**Note De-Identified  Obfuscation** I called [facility] to see if a Pradaxa PA  (switched from Xarelto due to cost on 6/9 and pt asst requirements) is required and was advised that it is available without a PA but the pt has a high deductible.  I then called the pt who states that Remeka may have been calling from [place] as they are helping him apply for pt asst for Pradaxa through [facility] He requested that I s/w his wife [name] as she has emailed his application to [employer].  Per [name] the provider page of the pts application was not completed when she emailed it to [employer]. Based on this information, I think Remeka called from [employer] and is requesting that I complete a provider page of a [place] application and fax it to her at [phone].

## 2020-10-01 NOTE — Telephone Encounter (Signed)
Will send this message back to operator/scheduler Lorel Monaco, to obtain a contact number for Remeka and to clarify more information in this message, regarding what type form they are requesting to have faxed. Triage and prior auth nurse to follow-up accordingly thereafter, once information obtained.

## 2020-10-01 NOTE — Telephone Encounter (Signed)
Remeka from the pt's RX group states they still have not received the fax back.She wants it re faxed to [phone]

## 2020-10-01 NOTE — Telephone Encounter (Signed)
**Note De-Identified  Obfuscation** Dr Johnsie Cancel is working remote today but he will be in the office on Monday 6/21 as DOD. I have emailed the completed provider page of a [place] application to his nurse so she can obtain his signature, date it, and to fax to [employer] [facility] at the fax number written on the cover letter included or to place in Medical Records.

## 2020-10-05 NOTE — Telephone Encounter (Signed)
Received email and had Dr. Johnsie Cancel sign form. Sent fax off and received confirmation.

## 2020-10-08 ENCOUNTER — Telehealth: Payer: Self-pay | Admitting: Cardiovascular Disease

## 2020-10-08 MED ORDER — DABIGATRAN ETEXILATE MESYLATE 150 MG PO CAPS: 150.0000 mg | ORAL_CAPSULE | Freq: Two times a day (BID) | ORAL | 3 refills | Status: DC

## 2020-10-08 MED ORDER — RIVAROXABAN 15 MG PO TABS: 15.0000 mg | ORAL_TABLET | Freq: Every day | ORAL | 0 refills | Status: DC

## 2020-10-08 NOTE — Telephone Encounter (Signed)
Patient's wife called and stated that the patient needs 2 more box samples of Xarelto. Patient and wife apologizes for asking again

## 2020-10-08 NOTE — Addendum Note (Signed)
Addended by: Aris Georgia, Devion Chriscoe L on: 10/08/2020 02:57 PM   Modules accepted: Orders

## 2020-10-08 NOTE — Telephone Encounter (Signed)
Pt approved for pt assistance. Patient made aware. BI had already called him as well

## 2020-10-08 NOTE — Telephone Encounter (Signed)
Called patient back about message. Patient is waiting on financial assistance for Pradaxa. Patient assistance is requesting prescription sent to them. Will send Pradaxa prescription to [facility] cord mail order in [place]. Patient has been taking xarelto 15 mg until he gets Pradaxa. Patient will run out over the weekend of Xarelto. Will give patient two weeks worth of xarelto 15 mg samples. This should give the Patient Assistance Program time to get Pradaxa to patient. Patient verbalized understanding.

## 2020-12-12 ENCOUNTER — Other Ambulatory Visit: Payer: Self-pay | Admitting: Cardiovascular Disease

## 2021-03-03 DIAGNOSIS — Z23 Encounter for immunization: Secondary | ICD-10-CM | POA: Diagnosis not present

## 2021-06-29 ENCOUNTER — Other Ambulatory Visit: Payer: Self-pay | Admitting: *Deleted

## 2021-06-29 MED ORDER — METOPROLOL TARTRATE 25 MG PO TABS: 25.0000 mg | ORAL_TABLET | Freq: Two times a day (BID) | ORAL | 0 refills | Status: DC

## 2021-07-12 DIAGNOSIS — H6121 Impacted cerumen, right ear: Secondary | ICD-10-CM | POA: Diagnosis not present

## 2021-07-12 DIAGNOSIS — H903 Sensorineural hearing loss, bilateral: Secondary | ICD-10-CM | POA: Diagnosis not present

## 2021-07-13 ENCOUNTER — Telehealth: Payer: Self-pay

## 2021-07-13 NOTE — Telephone Encounter (Addendum)
Patient complaining of left ankle and foot edema. Patient has been taking lasix but the edema is not getting any better. ? ?Patient stated that Pradaxa is too expensive needs something else in it's place. Insurance is recommending Warfarin. Patient's wife does not think Warfarin will work for them, since they do not live around her for frequent INR checks.  ? ?Made patient an office visit sooner than later to discuss edema and medication for blood thinner. Patient and his wife verbalized understanding. ?

## 2021-07-18 NOTE — Progress Notes (Signed)
? ?Date:  07/22/2021  ? ?ID:  Gregory Guerrero, DOB 07-10-1945, MRN [id number] ? ?PCP:  Patient, No Pcp Per (Inactive)  ?Cardiologist:  Dr.Annaliesa Blann  ? ?Chief Complaint: Dyspnea ? ?History of Present Illness:  ? ?76 y.o. remote stenting of LAD in [name] . History of DVT, HTN, HLD and CRF Chronic venous Disease with edema and right sided ulcers in past. Same dyspnea 01/2017 BNP and d dimer up V/Q negative started on lasix with improvement HTN and PVCls improved with beta blocker ? ?Echo reviewed 02/01/17 EF 40% grade 2 diastolic mild MR  ?ETT 98/1/19 normal  ? ?04/30/17 Right TKR [place] no cardiac complications ? ?MRI 05/15/17 with severe disc and facet degeneration multilevel  ?Cervical decompression with [name] 06/27/17 little improvement ?Placed on Gabapentin Had repeat surgery at [facility] but again little improvement ? ?Noted new onset afib 07/27/20 in clinic Asymptomatic and rate controlled on lopressor already Started on renal dose xarelto 15 mg dail y  ? ?[facility] 07/27/20 with conversion to NSR  ? ?Complaining of left ankle edema despite lasix Xarelto and Pradaxa expensive and insurance suggesting coumadin as alternative  ? ?In office today he is in flutter again rate 109 Discussed adding AAT with amiodarone and doing [facility] in 2 weeks with f/u Dr [name] to discuss possible future ablation with watchman since affording DOAC such and issue.  ? ?His LE edema is from venous insufficiency change to stronger diuretic demedex ? ? ?Past Medical History:  ?Diagnosis Date  ? Arthritis   ? CAD (coronary artery disease)   ? Chronic kidney disease   ? DVT (deep venous thrombosis) ([place])   ? following spinal surgery Nov 2017  ? HTN (hypertension)   ? MI (myocardial infarction) ([place])   ? 1999 ; stents placed   ? Numbness and tingling in both hands   ? Spondylosis of cervical spine   ? ? ?Past Surgical History:  ?Procedure Laterality Date  ? ANTERIOR CERVICAL DECOMP/DISCECTOMY FUSION N/A 06/27/2017  ? Procedure: ANTERIOR CERVICAL  DECOMPRESSION/DISCECTOMY FUSION C3-4;  Surgeon: [name], MD;  Location: [facility];  Service: Orthopedics;  Laterality: N/A;  3 hrs  ? ANTERIOR LAT LUMBAR FUSION N/A 03/01/2016  ? Procedure: EXTREME LATERAL INTERBODY FUSION  LUMBAR 2-4;  Surgeon: [name], MD;  Location: [facility];  Service: Orthopedics;  Laterality: N/A;  ? CARDIAC CATHETERIZATION    ? 20 yrs ago  ? CARDIOVERSION N/A 07/27/2020  ? Procedure: CARDIOVERSION;  Surgeon: [name], MD;  Location[facility];  Service: Cardiovascular;  Laterality: N/A;  ? JOINT REPLACEMENT    ? RADIOLOGY WITH ANESTHESIA N/A 05/15/2017  ? Procedure: MRI WITH ANESTHESIA CERVICAL SPINE WITH AND WITHOUT;  Surgeon: Radiologist, Medication, MD;  Location: [facility];  Service: Radiology;  Laterality: N/A;  ? SPINAL FUSION N/A 03/02/2016  ? Procedure: POSTERIOR SPINAL FUSION INTERBODY L2-S1, DECOMPRESSION L4-S1;  Surgeon: [name], MD;  Location: [facility];  Service: Orthopedics;  Laterality: N/A;  ? TOTAL KNEE ARTHROPLASTY Right 04/30/2017  ? Procedure: RIGHT TOTAL KNEE ARTHROPLASTY;  Surgeon: [name], MD;  Location: [facility];  Service: Orthopedics;  Laterality: Right;  ? ? ?Current Medications: ?Prior to Admission medications   ?Medication Sig Start Date End Date Taking? Authorizing Provider  ?Ascorbic Acid (VITAMIN C) 1000 MG tablet Take 1,000 mg by mouth daily.    [provider]  ?aspirin EC 81 MG tablet Take 81 mg by mouth daily.    [provider]  ?furosemide (LASIX) 20 MG  tablet Take 1 tablet (20 mg total) by mouth daily. 01/25/17   Colon Branch, MD  ?losartan (COZAAR) 100 MG tablet Take 1 tablet (100 mg total) by mouth daily. 01/05/17   [name], MD  ?Multiple Vitamin (MULTIVITAMIN WITH MINERALS) TABS tablet Take 1 tablet by mouth daily.    [provider]  ?rosuvastatin (CRESTOR) 20 MG tablet Take 1 tablet (20 mg total) by mouth at bedtime. 01/24/17   Colon Branch, MD  ? ? ?Allergies:   Patient has no known allergies.   ? ?Social History  ? ?Socioeconomic History  ? Marital status: Married  ?  Spouse name: Not on file  ? Number of children: 2  ? Years of education: Not on file  ? Highest education level: Not on file  ?Occupational History  ? Occupation: retired , Pension scheme manager busines   ?Tobacco Use  ? Smoking status: Former  ?  Types: Cigarettes  ?  Quit date: 04/26/1983  ?  Years since quitting: 38.2  ? Smokeless tobacco: Never  ? Tobacco comments:  ?  quit over 40 years ago   ?Vaping Use  ? Vaping Use: Never used  ?Substance and Sexual Activity  ? Alcohol use: No  ? Drug use: No  ? Sexual activity: Never  ?Other Topics Concern  ? Not on file  ?Social History Narrative  ? Household pt, wife, a pet   ? Moved from [place]   ? ?Social Determinants of Health  ? ?Financial Resource Strain: Not on file  ?Food Insecurity: Not on file  ?Transportation Needs: Not on file  ?Physical Activity: Not on file  ?Stress: Not on file  ?Social Connections: Not on file  ?  ? ?Family History:  The patient's family history includes Cancer - Other in his mother; Heart attack in his father; Uterine cancer in his sister. ? ?ROS:   ?Please see the history of present illness.    ?ROS All other systems reviewed and are negative. ? ? ?PHYSICAL EXAM:   ?VS:  There were no vitals taken for this visit.   ? ?Affect appropriate ?Healthy:  appears stated age ?HEENT: normal ?Neck supple with no adenopathy ?JVP normal no bruits no thyromegaly ?Lungs clear with no wheezing and good diaphragmatic motion ?Heart:  S1/S2 no murmur, no rub, gallop or click ?PMI normal ?Abdomen: benighn, BS positve, no tenderness, no AAA ?no bruit.  No HSM or HJR ?Distal pulses intact with no bruits ?Plus 2 bilateral edema with stasis LLE worse than right  ?Neuro non-focal post lumbar surgery  ?Skin warm and dry ?Post R TKR  ? ? ? ?Wt Readings from Last 3 Encounters:  ?07/27/20 208 lb (94.3 kg)  ?06/29/20 215 lb 3.2 oz (97.6 kg)  ?06/08/20 215 lb (97.5 kg)  ?  ? ? ?Studies/Labs Reviewed:   ? ?EKG:    01/24/17 SR rate 71 normal PVC nonspecific ST changes 07/22/2021 aflutter rate 105 poor R wave progression  ? ?Recent Labs: ?07/23/2020: BUN 30; Creatinine, Ser 1.79; Potassium 4.7; Sodium 144 ?08/27/2020: Hemoglobin 14.0; Platelets 163  ? ?Lipid Panel ?   ?Component Value Date/Time  ? CHOL 166 01/15/2018 1516  ? TRIG 189 (H) 01/15/2018 1516  ? HDL 42 01/15/2018 1516  ? CHOLHDL 4.0 01/15/2018 1516  ? CHOLHDL 4 06/24/2013 1113  ? VLDL 16.2 06/24/2013 1113  ? [place] 86 01/15/2018 1516  ? ? ?Additional studies/ records that were reviewed today include:  ?AS above ? ? ? ?ASSESSMENT & PLAN:  ?  ? ?  1. CAD s/p remote LAD stenting ?-  No chest pain. ETT 02/23/17 non ischemic continue ASA and beta blocker and statin  ? ?2. HTN- Well controlled.  Continue current medications and low sodium Dash type diet.   ? ?3. HLD-  Continue statin labs with primary  ? ?4. CKD stage III  Baseline Cr 1.5 stable  ? ?5. Ortho:  Post right TKR continue PT/OT    ? ?6. Neuropathy:  Severe cervical disease with dense sensory deficit in both UE;s ?Post cervical decompression F/U [name] He has stopped Gabapentin on his ?Own as it made him feel poorly  ? ?7. Diastolic CHF:  see below check BNP/BMET change to demedex  ? ?8. PAF: [place] on 07/27/20  Difficulty affording DOAC Discussed trying to take at least 8 more weeks to facilitate [facility] and not delaying it to get Rx INR;s Amiodarone 200 bid for 2 weeks then try to arrange [facility]. Will f/u with EP to discuss possible ablation / Watchman as long term strategy. He does not want to try Tikosyn with hospitalization  ? ?9. Edema:  LE  Venous insufficiency demedex 20 mg bid d/c lasix Compression stockings f/u primary may need to see wound center in future  ? ? ?BNP/BMET/CBC ?Amiodarone 200 bid ?D/c lasix Demedex 20 bid ?[place] arranged  ?F/U Dr [name] post procedure  ?F/U with me 3 months  ? ? ?Jenkins Rouge ? ?

## 2021-07-18 NOTE — H&P (View-Only) (Signed)
? ?Date:  07/22/2021  ? ?ID:  Gregory Guerrero, DOB [birth year], MRN [id number] ? ?PCP:  Patient, No Pcp Per (Inactive)  ?Cardiologist:  Dr.Kordelia Severin  ? ?Chief Complaint: Dyspnea ? ?History of Present Illness:  ? ?76 y.o. remote stenting of LAD in [name] . History of DVT, HTN, HLD and CRF Chronic venous Disease with edema and right sided ulcers in past. Same dyspnea 01/2017 BNP and d dimer up V/Q negative started on lasix with improvement HTN and PVCls improved with beta blocker ? ?Echo reviewed 02/01/17 EF 03% grade 2 diastolic mild MR  ?ETT 50/0/93 normal  ? ?04/30/17 Right TKR Alusio no cardiac complications ? ?MRI 05/15/17 with severe disc and facet degeneration multilevel  ?Cervical decompression with [name] 06/27/17 little improvement ?Placed on Gabapentin Had repeat surgery at [facility] but again little improvement ? ?Noted new onset afib 07/27/20 in clinic Asymptomatic and rate controlled on lopressor already Started on renal dose xarelto 15 mg dail y  ? ?[facility] 07/27/20 with conversion to NSR  ? ?Complaining of left ankle edema despite lasix Xarelto and Pradaxa expensive and insurance suggesting coumadin as alternative  ? ?In office today he is in flutter again rate 109 Discussed adding AAT with amiodarone and doing [facility] in 2 weeks with f/u Dr [name] to discuss possible future ablation with watchman since affording DOAC such and issue.  ? ?His LE edema is from venous insufficiency change to stronger diuretic demedex ? ? ?Past Medical History:  ?Diagnosis Date  ? Arthritis   ? CAD (coronary artery disease)   ? Chronic kidney disease   ? DVT (deep venous thrombosis) ([place])   ? following spinal surgery Nov 2017  ? HTN (hypertension)   ? MI (myocardial infarction) ([place])   ? 1999 ; stents placed   ? Numbness and tingling in both hands   ? Spondylosis of cervical spine   ? ? ?Past Surgical History:  ?Procedure Laterality Date  ? ANTERIOR CERVICAL DECOMP/DISCECTOMY FUSION N/A 06/27/2017  ? Procedure: ANTERIOR CERVICAL  DECOMPRESSION/DISCECTOMY FUSION C3-4;  Surgeon: [name], MD;  Location: [place];  Service: Orthopedics;  Laterality: N/A;  3 hrs  ? ANTERIOR LAT LUMBAR FUSION N/A 03/01/2016  ? Procedure: EXTREME LATERAL INTERBODY FUSION  LUMBAR 2-4;  Surgeon: [name], MD;  Location: [place];  Service: Orthopedics;  Laterality: N/A;  ? CARDIAC CATHETERIZATION    ? 20 yrs ago  ? CARDIOVERSION N/A 07/27/2020  ? Procedure: CARDIOVERSION;  Surgeon: [name], MD;  Location: [facility] ENDOSCOPY;  Service: Cardiovascular;  Laterality: N/A;  ? JOINT REPLACEMENT    ? RADIOLOGY WITH ANESTHESIA N/A 05/15/2017  ? Procedure: MRI WITH ANESTHESIA CERVICAL SPINE WITH AND WITHOUT;  Surgeon: Radiologist, Medication, MD;  Location: [place];  Service: Radiology;  Laterality: N/A;  ? SPINAL FUSION N/A 03/02/2016  ? Procedure: POSTERIOR SPINAL FUSION INTERBODY L2-S1, DECOMPRESSION L4-S1;  Surgeon: [name], MD;  Location: [place];  Service: Orthopedics;  Laterality: N/A;  ? TOTAL KNEE ARTHROPLASTY Right 04/30/2017  ? Procedure: RIGHT TOTAL KNEE ARTHROPLASTY;  Surgeon: [name], MD;  [facility];  Service: Orthopedics;  Laterality: Right;  ? ? ?Current Medications: ?Prior to Admission medications   ?Medication Sig Start Date End Date Taking? Authorizing Provider  ?Ascorbic Acid (VITAMIN C) 1000 MG tablet Take 1,000 mg by mouth daily.    [provider]  ?aspirin EC 81 MG tablet Take 81 mg by mouth daily.    [provider]  ?furosemide (LASIX) 20 MG  tablet Take 1 tablet (20 mg total) by mouth daily. 01/25/17   Colon Branch, MD  ?losartan (COZAAR) 100 MG tablet Take 1 tablet (100 mg total) by mouth daily. 01/05/17   [name], MD  ?Multiple Vitamin (MULTIVITAMIN WITH MINERALS) TABS tablet Take 1 tablet by mouth daily.    [provider]  ?rosuvastatin (CRESTOR) 20 MG tablet Take 1 tablet (20 mg total) by mouth at bedtime. 01/24/17   Colon Branch, MD  ? ? ?Allergies:   Patient has no known allergies.   ? ?Social History  ? ?Socioeconomic History  ? Marital status: Married  ?  Spouse name: Not on file  ? Number of children: 2  ? Years of education: Not on file  ? Highest education level: Not on file  ?Occupational History  ? Occupation: retired , Pension scheme manager busines   ?Tobacco Use  ? Smoking status: Former  ?  Types: Cigarettes  ?  Quit date: 04/26/1983  ?  Years since quitting: 38.2  ? Smokeless tobacco: Never  ? Tobacco comments:  ?  quit over 40 years ago   ?Vaping Use  ? Vaping Use: Never used  ?Substance and Sexual Activity  ? Alcohol use: No  ? Drug use: No  ? Sexual activity: Never  ?Other Topics Concern  ? Not on file  ?Social History Narrative  ? Household pt, wife, a pet   ? Moved from [place]   ? ?Social Determinants of Health  ? ?Financial Resource Strain: Not on file  ?Food Insecurity: Not on file  ?Transportation Needs: Not on file  ?Physical Activity: Not on file  ?Stress: Not on file  ?Social Connections: Not on file  ?  ? ?Family History:  The patient's family history includes Cancer - Other in his mother; Heart attack in his father; Uterine cancer in his sister. ? ?ROS:   ?Please see the history of present illness.    ?ROS All other systems reviewed and are negative. ? ? ?PHYSICAL EXAM:   ?VS:  There were no vitals taken for this visit.   ? ?Affect appropriate ?Healthy:  appears stated age ?HEENT: normal ?Neck supple with no adenopathy ?JVP normal no bruits no thyromegaly ?Lungs clear with no wheezing and good diaphragmatic motion ?Heart:  S1/S2 no murmur, no rub, gallop or click ?PMI normal ?Abdomen: benighn, BS positve, no tenderness, no AAA ?no bruit.  No HSM or HJR ?Distal pulses intact with no bruits ?Plus 2 bilateral edema with stasis LLE worse than right  ?Neuro non-focal post lumbar surgery  ?Skin warm and dry ?Post R TKR  ? ? ? ?Wt Readings from Last 3 Encounters:  ?07/27/20 208 lb (94.3 kg)  ?06/29/20 215 lb 3.2 oz (97.6 kg)  ?06/08/20 215 lb (97.5 kg)  ?  ? ? ?Studies/Labs Reviewed:   ? ?EKG:    01/24/17 SR rate 71 normal PVC nonspecific ST changes 07/22/2021 aflutter rate 105 poor R wave progression  ? ?Recent Labs: ?07/23/2020: BUN 30; Creatinine, Ser 1.79; Potassium 4.7; Sodium 144 ?08/27/2020: Hemoglobin 14.0; Platelets 163  ? ?Lipid Panel ?   ?Component Value Date/Time  ? CHOL 166 01/15/2018 1516  ? TRIG 189 (H) 01/15/2018 1516  ? HDL 42 01/15/2018 1516  ? CHOLHDL 4.0 01/15/2018 1516  ? CHOLHDL 4 06/24/2013 1113  ? VLDL 16.2 06/24/2013 1113  ? Earth 86 01/15/2018 1516  ? ? ?Additional studies/ records that were reviewed today include:  ?AS above ? ? ? ?ASSESSMENT & PLAN:  ?  ? ?  1. CAD s/p remote LAD stenting ?-  No chest pain. ETT 02/23/17 non ischemic continue ASA and beta blocker and statin  ? ?2. HTN- Well controlled.  Continue current medications and low sodium Dash type diet.   ? ?3. HLD-  Continue statin labs with primary  ? ?4. CKD stage III  Baseline Cr 1.5 stable  ? ?5. Ortho:  Post right TKR continue PT/OT    ? ?6. Neuropathy:  Severe cervical disease with dense sensory deficit in both UE;s ?Post cervical decompression F/U [name] He has stopped Gabapentin on his ?Own as it made him feel poorly  ? ?7. Diastolic CHF:  see below check BNP/BMET change to demedex  ? ?8. PAF: [place] on 07/27/20  Difficulty affording DOAC Discussed trying to take at least 8 more weeks to facilitate [facility] and not delaying it to get Rx INR;s Amiodarone 200 bid for 2 weeks then try to arrange [facility]. Will f/u with EP to discuss possible ablation / Watchman as long term strategy. He does not want to try Tikosyn with hospitalization  ? ?9. Edema:  LE  Venous insufficiency demedex 20 mg bid d/c lasix Compression stockings f/u primary may need to see wound center in future  ? ? ?BNP/BMET/CBC ?Amiodarone 200 bid ?D/c lasix Demedex 20 bid ?[facility] arranged  ?F/U Dr [name] post procedure  ?F/U with me 3 months  ? ? ?Jenkins Rouge ? ?

## 2021-07-22 ENCOUNTER — Other Ambulatory Visit: Payer: Self-pay | Admitting: Cardiovascular Disease

## 2021-07-22 ENCOUNTER — Encounter: Payer: Self-pay | Admitting: Cardiovascular Disease

## 2021-07-22 ENCOUNTER — Ambulatory Visit (INDEPENDENT_AMBULATORY_CARE_PROVIDER_SITE_OTHER): Payer: Medicare Other | Admitting: Cardiovascular Disease

## 2021-07-22 VITALS — BP 110/56 | HR 105 | Ht 68.5 in | Wt 209.0 lb

## 2021-07-22 DIAGNOSIS — I10 Essential (primary) hypertension: Secondary | ICD-10-CM | POA: Diagnosis not present

## 2021-07-22 DIAGNOSIS — R609 Edema, unspecified: Secondary | ICD-10-CM | POA: Diagnosis not present

## 2021-07-22 DIAGNOSIS — Z5181 Encounter for therapeutic drug level monitoring: Secondary | ICD-10-CM

## 2021-07-22 DIAGNOSIS — Z7901 Long term (current) use of anticoagulants: Secondary | ICD-10-CM

## 2021-07-22 DIAGNOSIS — E782 Mixed hyperlipidemia: Secondary | ICD-10-CM | POA: Diagnosis not present

## 2021-07-22 DIAGNOSIS — N183 Chronic kidney disease, stage 3 unspecified: Secondary | ICD-10-CM

## 2021-07-22 DIAGNOSIS — I5032 Chronic diastolic (congestive) heart failure: Secondary | ICD-10-CM

## 2021-07-22 DIAGNOSIS — I480 Paroxysmal atrial fibrillation: Secondary | ICD-10-CM

## 2021-07-22 DIAGNOSIS — I4819 Other persistent atrial fibrillation: Secondary | ICD-10-CM | POA: Diagnosis not present

## 2021-07-22 DIAGNOSIS — Z79899 Other long term (current) drug therapy: Secondary | ICD-10-CM

## 2021-07-22 MED ORDER — TORSEMIDE 20 MG PO TABS: 20.0000 mg | ORAL_TABLET | Freq: Two times a day (BID) | ORAL | 3 refills | Status: DC

## 2021-07-22 MED ORDER — DABIGATRAN ETEXILATE MESYLATE 150 MG PO CAPS
150.0000 mg | ORAL_CAPSULE | Freq: Two times a day (BID) | ORAL | 3 refills | Status: DC
Start: 2021-07-22 — End: 2021-07-25

## 2021-07-22 MED ORDER — AMIODARONE HCL 200 MG PO TABS: 200.0000 mg | ORAL_TABLET | Freq: Two times a day (BID) | ORAL | 3 refills | Status: DC

## 2021-07-22 NOTE — Patient Instructions (Signed)
Medication Instructions:  ?Your physician has recommended you make the following change in your medication: ?1-STOP Lasix  ?2-START Torsemide 20 mg by mouth twice daily ?3-START Amiodarone 200 mg by mouth twice daily ? ?*If you need a refill on your cardiac medications before your next appointment, please call your pharmacy* ? ?Lab Work: ?Your physician recommends that you have lab work today- BMET, CBC, BNP ? ?If you have labs (blood work) drawn today and your tests are completely normal, you will receive your results only by: ?MyChart Message (if you have MyChart) OR ?A paper copy in the mail ?If you have any lab test that is abnormal or we need to change your treatment, we will call you to review the results. ? ?Testing/Procedures: ?Your physician has recommended that you have a Cardioversion (DCCV). Electrical Cardioversion uses a jolt of electricity to your heart either through paddles or wired patches attached to your chest. This is a controlled, usually prescheduled, procedure. Defibrillation is done under light anesthesia in the hospital, and you usually go home the day of the procedure. This is done to get your heart back into a normal rhythm. You are not awake for the procedure. Please see the instruction sheet given to you today. ? ?Follow-Up: ?At [facility], you and your health needs are our priority.  As part of our continuing mission to provide you with exceptional heart care, we have created designated Provider Care Teams.  These Care Teams include your primary Cardiologist (physician) and Advanced Practice Providers (APPs -  Physician Assistants and Nurse Practitioners) who all work together to provide you with the care you need, when you need it. ? ?We recommend signing up for the patient portal called "MyChart".  Sign up information is provided on this After Visit Summary.  MyChart is used to connect with patients for Virtual Visits (Telemedicine).  Patients are able to view lab/test results,  encounter notes, upcoming appointments, etc.  Non-urgent messages can be sent to your provider as well.   ?To learn more about what you can do with MyChart, go to [email or web address].   ? ?Your next appointment:   ?5 week(s) ? ?The format for your next appointment:   ?In Person ? ?Provider:   ?Jenkins Rouge, MD { ? ?Other Instructions ?You have been referred to Dr. [name] to discuss Watchman/ Ablation ? ?You are scheduled for a Cardioversionon 08/05/21 with Dr. Gasper Sells.  Please arrive at the [facility] (Main Entrance A) at [facility]: [address] [place], [place] [address] at 7:30 am ? ?DIET: Nothing to eat or drink after midnight except a sip of water with medications (see medication instructions below) ? ?FYI: For your safety, and to allow [place] to monitor your vital signs accurately during the surgery/procedure we request that   ?if you have artificial nails, gel coating, SNS etc. Please have those removed prior to your surgery/procedure. Not having the nail coverings /polish removed may result in cancellation or delay of your surgery/procedure. ? ? ?Medication Instructions: ?Hold Torsemide the day of procedure ? ?Continue your anticoagulant: Pradaxa  ?You will need to continue your anticoagulant after your procedure until you  are told by your  ?Provider that it is safe to stop ? ?Labs: Will come in early for lab work on day of procedure ? ?You must have a responsible person to drive you home and stay in the waiting area during your procedure. Failure to do so could result in cancellation. ? ?Interior and spatial designer cards. ? ?*Special  Note: Every effort is made to have your procedure done on time. Occasionally there are emergencies that occur at the hospital that may cause delays. Please be patient if a delay does occur.   ? ?

## 2021-07-25 ENCOUNTER — Encounter: Payer: Self-pay | Admitting: Cardiovascular Disease

## 2021-07-25 ENCOUNTER — Other Ambulatory Visit: Payer: Self-pay

## 2021-07-25 MED ORDER — DABIGATRAN ETEXILATE MESYLATE 150 MG PO CAPS: 150.0000 mg | ORAL_CAPSULE | Freq: Two times a day (BID) | ORAL | 1 refills | Status: DC

## 2021-07-26 ENCOUNTER — Other Ambulatory Visit: Payer: Self-pay | Admitting: Cardiovascular Disease

## 2021-07-26 MED ORDER — METOPROLOL TARTRATE 25 MG PO TABS: 25.0000 mg | ORAL_TABLET | Freq: Two times a day (BID) | ORAL | 6 refills | Status: DC

## 2021-07-29 ENCOUNTER — Encounter (HOSPITAL_COMMUNITY): Payer: Self-pay | Admitting: Internal Medicine

## 2021-08-01 ENCOUNTER — Encounter: Payer: Self-pay | Admitting: Cardiovascular Disease

## 2021-08-01 MED ORDER — AMIODARONE HCL 200 MG PO TABS: 200.0000 mg | ORAL_TABLET | Freq: Two times a day (BID) | ORAL | 3 refills | Status: DC

## 2021-08-01 MED ORDER — TORSEMIDE 20 MG PO TABS: 20.0000 mg | ORAL_TABLET | Freq: Two times a day (BID) | ORAL | 3 refills | Status: DC

## 2021-08-02 ENCOUNTER — Encounter: Payer: Self-pay | Admitting: Internal Medicine

## 2021-08-02 ENCOUNTER — Ambulatory Visit (INDEPENDENT_AMBULATORY_CARE_PROVIDER_SITE_OTHER): Payer: Medicare Other | Admitting: Internal Medicine

## 2021-08-02 VITALS — BP 118/80 | HR 91 | Temp 97.9°F | Resp 18 | Ht 68.5 in | Wt 207.0 lb

## 2021-08-02 DIAGNOSIS — I4819 Other persistent atrial fibrillation: Secondary | ICD-10-CM | POA: Diagnosis not present

## 2021-08-02 DIAGNOSIS — I10 Essential (primary) hypertension: Secondary | ICD-10-CM

## 2021-08-02 DIAGNOSIS — R739 Hyperglycemia, unspecified: Secondary | ICD-10-CM | POA: Diagnosis not present

## 2021-08-02 DIAGNOSIS — G629 Polyneuropathy, unspecified: Secondary | ICD-10-CM

## 2021-08-02 DIAGNOSIS — I2510 Atherosclerotic heart disease of native coronary artery without angina pectoris: Secondary | ICD-10-CM | POA: Diagnosis not present

## 2021-08-02 DIAGNOSIS — E559 Vitamin D deficiency, unspecified: Secondary | ICD-10-CM | POA: Diagnosis not present

## 2021-08-02 MED ORDER — PREGABALIN 75 MG PO CAPS: 75.0000 mg | ORAL_CAPSULE | Freq: Two times a day (BID) | ORAL | 1 refills | Status: DC

## 2021-08-02 MED ORDER — KETOCONAZOLE 2 % EX CREA: 1.0000 "application " | TOPICAL_CREAM | Freq: Every day | CUTANEOUS | 1 refills | Status: DC

## 2021-08-02 NOTE — Progress Notes (Signed)
? ?Subjective:  ? ? Patient ID: Gregory Guerrero, male    DOB: 12/19/1945, 76 y.o.   MRN: [id number] ? ?DOS:  08/02/2021 ?Type of visit - description: New patient, here with his wife.  Last seen in person about 4 years ago. ? ?His main concern today is self-diagnosed neuropathy. ?Symptoms a started few months ago: He has a throbbing discomfort at both feet for the last few months, it is worse when he lays down and rest at night.  When he stands up and put some pressure on his feet, this sensation decreases. ?He is able to walk half hour daily without claudication. ?He also admits to numbness at the feet. ?He was prescribed gabapentin elsewhere, did not like the way he felt. ? ? ?Also, reports L worse than R swelling of the legs.  Recently saw cardiology, medications adjusted, swelling decreased. ? ?I also noted tenia pedis , when asked, he reports they noticed the rash on the feet  for few months. ? ? ?Review of Systems ?Denies chest pain or difficulty breathing ?No orthopnea ?No nausea or vomiting ?No upper extremity paresthesias ? ?Past Medical History:  ?Diagnosis Date  ? Arthritis   ? CAD (coronary artery disease)   ? Chronic kidney disease   ? DVT (deep venous thrombosis) ([place])   ? following spinal surgery Nov 2017  ? HTN (hypertension)   ? MI (myocardial infarction) ([place])   ? 1999 ; stents placed   ? Numbness and tingling in both hands   ? Spondylosis of cervical spine   ? ? ?Past Surgical History:  ?Procedure Laterality Date  ? ANTERIOR CERVICAL DECOMP/DISCECTOMY FUSION N/A 06/27/2017  ? Procedure: ANTERIOR CERVICAL DECOMPRESSION/DISCECTOMY FUSION C3-4;  Surgeon: [name], MD;  Location: [place];  Service: Orthopedics;  Laterality: N/A;  3 hrs  ? ANTERIOR CERVICAL DECOMP/DISCECTOMY FUSION  01/24/2019  ? ANTERIOR LAT LUMBAR FUSION N/A 03/01/2016  ? Procedure: EXTREME LATERAL INTERBODY FUSION  LUMBAR 2-4;  Surgeon: [name], MD;  Location: [place];  Service: Orthopedics;  Laterality: N/A;  ? CARDIAC  CATHETERIZATION    ? 20 yrs ago  ? CARDIOVERSION N/A 07/27/2020  ? Procedure: CARDIOVERSION;  Surgeon: [name], MD;  Location: [facility] ENDOSCOPY;  Service: Cardiovascular;  Laterality: N/A;  ? JOINT REPLACEMENT    ? NECK HARDWARE REMOVAL  12/11/2018  ? RADIOLOGY WITH ANESTHESIA N/A 05/15/2017  ? Procedure: MRI WITH ANESTHESIA CERVICAL SPINE WITH AND WITHOUT;  Surgeon: Radiologist, Medication, MD;  Location: [place];  Service: Radiology;  Laterality: N/A;  ? SPINAL FUSION N/A 03/02/2016  ? Procedure: POSTERIOR SPINAL FUSION INTERBODY L2-S1, DECOMPRESSION L4-S1;  Surgeon: [name], MD;  Location: [place];  Service: Orthopedics;  Laterality: N/A;  ? TOTAL KNEE ARTHROPLASTY Right 04/30/2017  ? Procedure: RIGHT TOTAL KNEE ARTHROPLASTY;  Surgeon: [name], MD;  [facility];  Service: Orthopedics;  Laterality: Right;  ? ?Social History  ? ?Socioeconomic History  ? Marital status: Married  ?  Spouse name: Not on file  ? Number of children: 2  ? Years of education: Not on file  ? Highest education level: Not on file  ?Occupational History  ? Occupation: retired , Pension scheme manager busines   ?Tobacco Use  ? Smoking status: Former  ?  Types: Cigarettes  ?  Quit date: 04/26/1983  ?  Years since quitting: 38.2  ? Smokeless tobacco: Never  ? Tobacco comments:  ?  quit over 40 years ago   ?Vaping Use  ? Vaping Use: Never  used  ?Substance and Sexual Activity  ? Alcohol use: No  ? Drug use: No  ? Sexual activity: Never  ?Other Topics Concern  ? Not on file  ?Social History Narrative  ? Household pt, wife   ? Moved from [place]   ? ?Social Determinants of Health  ? ?Financial Resource Strain: Not on file  ?Food Insecurity: Not on file  ?Transportation Needs: Not on file  ?Physical Activity: Not on file  ?Stress: Not on file  ?Social Connections: Not on file  ?Intimate Partner Violence: Not on file  ? ?Family History  ?Problem Relation Age of Onset  ? Cancer - Other Mother   ? Heart attack Father   ? Uterine cancer Sister   ?  Colon cancer Neg Hx   ? Prostate cancer Neg Hx   ? ? ? ?Current Outpatient Medications  ?Medication Instructions  ? acetaminophen (TYLENOL) 650 mg, Oral, 2 times daily  ? amiodarone (PACERONE) 200 mg, Oral, 2 times daily  ? aspirin EC 81 mg, Oral, Every evening  ? atorvastatin (LIPITOR) 80 MG tablet TAKE 1 TABLET('80MG'$ ) BY MOUTH EVERY OTHER DAY AND TAKE 1/2 TABLET BY MOUTH ON ALTERNATE DAYS  ? dabigatran (PRADAXA) 150 mg, Oral, 2 times daily  ? ketoconazole (NIZORAL) 2 % cream 1 application., Topical, Daily  ? metoprolol tartrate (LOPRESSOR) 25 mg, Oral, 2 times daily  ? Multiple Vitamin (MULTIVITAMIN) capsule 1 capsule, Oral, Daily  ? OVER THE COUNTER MEDICATION 1 application., Topical, Daily PRN, CBD cream  ? pregabalin (LYRICA) 75 mg, Oral, 2 times daily  ? torsemide (DEMADEX) 20 mg, Oral, 2 times daily  ? vitamin C 1,000 mg, Oral, Daily  ? ? ?   ?Objective:  ? Physical Exam ?BP 118/80 (BP Location: Left Arm, Patient Position: Sitting, Cuff Size: Small)   Pulse 91   Temp 97.9 ?F (36.6 ?C) (Oral)   Resp 18   Ht 5' 8.5" (1.74 m)   Wt 207 lb (93.9 kg)   SpO2 98%   BMI 31.02 kg/m?  ?General:   ?Well developed, NAD, BMI noted.  ?HEENT:  ?Normocephalic . Face symmetric, atraumatic ?Neck: No thyromegaly ?Lungs:  ?CTA B ?Normal respiratory effort, no intercostal retractions, no accessory muscle use. ?Heart: Irregularly irregular ?Abdomen:  ?Not distended, soft, non-tender. No rebound or rigidity.  No organomegaly ?Lower extremities: Mild lower extremity edema up to mid pretibial area, slightly more noticeable on the L. ?Excellent femoral pulses, present pedal pulses ?Nail dystrophy throughout ?Has a slightly red, dry and scaly rash in a moccasin distribution bilaterally ?Neurologic:  ?alert & oriented X3.  ?Speech normal, gait appropriate for age and unassisted ?DTR symmetric, decreased bilateral ankle jerk.  Motor seems normal. ?Pinprick examination: Decreased sensitivity in both feet. ?Psych--  ?Cognition and  judgment appear intact.  ?Cooperative with normal attention span and concentration.  ?Behavior appropriate. ?No anxious or depressed appearing. ? ?   ?Assessment   ? ?  ?Assessment--- new patient 01/24/2017 ?HTN, dx ~ 2010 ?Hyperlipidemia ?CKD ?CV: ?-CAD, history of remote MI; EET 02-23-17 w/ no ischemia ?- Diastolic CHF ?-H/o DVT ~ 51/0258 after back surgery ?- Chronic venous dz ?- Paroxysmal A-fib Dx 05-2020 during routine cardiology visit ?BCC, L face, last derm note 2014 ?Lower extremity ulcer, right leg after an injury ?H/o elevated PSA per urology note 2014, DRE normal, PSA was 3.7.  ?Cervical myelopathy: Surgery 06/27/2017: Anterior cervical decompression and discectomy with fusion of C3-C4 ? ? ?PLAN ?New patient, presents after more than 3 years.  Request evaluation for neuropathy. ?Neuropathy: ?The patient reports he "self diagnosed" neuropathy few months ago.  See symptoms description above. ?On reviewing the chart, he has a history of cervical myelopathy, had surgery in 2019. Currently  has mild neck and back pain. ?Neuropathy has been mentioned in the chart multiple times. ?Symptoms and physical examination are consistent with neuropathy.  He has good peripheral pulses. ?Gabapentin was not effective per pt  ?Plan: ?Labs: LFTs, A1c, TSH, RPR,  D22, folic acid , vit D (HIV not covered) ?Trial with Lyrica ?Neurology referral ?CAD, paroxysmal A-fib, lower extremity edema: Managed by cardiology, ?Saw cardiology few days ago, the started amiodarone, changed Lasix to [facility] and planning DCCV. ?Not taking losartan (has not been refilled in years) ?Tinea pedis: See physical exam, also have onychomycosis.  Rx topical nizoral although he may need oral medications at some point. ?RTC 2 months ?  ? ?This visit occurred during the SARS-CoV-2 public health emergency.  Safety protocols were in place, including screening questions prior to the visit, additional usage of staff PPE, and extensive cleaning of exam room while  observing appropriate contact time as indicated for disinfecting solutions.  ? ?

## 2021-08-02 NOTE — Patient Instructions (Addendum)
Please schedule a Medicare Wellness visit.  ? ? ? ?I sent the prescription for Lyrica 75 mg. ?The first 10 days take only 1 tablet at nighttime. ?If you tolerate well, take 1 tablet twice daily. ? ?Applying a cream I sent twice daily to your feet for at least 2 weeks. ? ?We are referring you to a neurologist ?  ? ? ?GO TO THE LAB : Get the blood work   ? ? ?[place], [place] ?Come back for a checkup in 2 months ?

## 2021-08-03 ENCOUNTER — Encounter: Payer: Self-pay | Admitting: Internal Medicine

## 2021-08-03 LAB — HEPATIC FUNCTION PANEL
ALT: 30 U/L (ref 0–53)
AST: 45 U/L — ABNORMAL HIGH (ref 0–37)
Albumin: 3.8 g/dL (ref 3.5–5.2)
Alkaline Phosphatase: 91 U/L (ref 39–117)
Bilirubin, Direct: 0.3 mg/dL (ref 0.0–0.3)
Total Bilirubin: 1.2 mg/dL (ref 0.2–1.2)
Total Protein: 5.8 g/dL — ABNORMAL LOW (ref 6.0–8.3)

## 2021-08-03 LAB — VITAMIN D 25 HYDROXY (VIT D DEFICIENCY, FRACTURES): VITD: 41.19 ng/mL (ref 30.00–100.00)

## 2021-08-03 LAB — B12 AND FOLATE PANEL
Folate: >23.5 ng/mL (ref >5.9)
Vitamin B-12: 725 pg/mL (ref 211–911)

## 2021-08-03 LAB — RPR: RPR Ser Ql: NONREACTIVE

## 2021-08-03 LAB — TSH: TSH: 2 u[IU]/mL (ref 0.35–5.50)

## 2021-08-03 LAB — HEMOGLOBIN A1C: Hgb A1c MFr Bld: 5.7 % (ref 4.6–6.5)

## 2021-08-03 NOTE — Assessment & Plan Note (Signed)
New patient, presents after more than 3 years.  Request evaluation for neuropathy. ?Neuropathy: ?The patient reports he "self diagnosed" neuropathy few months ago.  See symptoms description above. ?On reviewing the chart, he has a history of cervical myelopathy, had surgery in 2019. Currently  has mild neck and back pain. ?Neuropathy has been mentioned in the chart multiple times. ?Symptoms and physical examination are consistent with neuropathy.  He has good peripheral pulses. ?Gabapentin was not effective per pt  ?Plan: ?Labs: LFTs, A1c, TSH, RPR,  W41, folic acid , vit D (HIV not covered) ?Trial with Lyrica ?Neurology referral ?CAD, paroxysmal A-fib, lower extremity edema: Managed by cardiology, ?Saw cardiology few days ago, the started amiodarone, changed Lasix to [facility] and planning DCCV. ?Not taking losartan (has not been refilled in years) ?Tinea pedis: See physical exam, also have onychomycosis.  Rx topical nizoral although he may need oral medications at some point. ?RTC 2 months ?  ?

## 2021-08-04 DIAGNOSIS — H903 Sensorineural hearing loss, bilateral: Secondary | ICD-10-CM | POA: Diagnosis not present

## 2021-08-04 DIAGNOSIS — H9313 Tinnitus, bilateral: Secondary | ICD-10-CM | POA: Diagnosis not present

## 2021-08-04 DIAGNOSIS — H9121 Sudden idiopathic hearing loss, right ear: Secondary | ICD-10-CM | POA: Diagnosis not present

## 2021-08-05 ENCOUNTER — Other Ambulatory Visit: Payer: Self-pay

## 2021-08-05 ENCOUNTER — Encounter (HOSPITAL_COMMUNITY): Payer: Self-pay | Admitting: Internal Medicine

## 2021-08-05 ENCOUNTER — Ambulatory Visit (HOSPITAL_COMMUNITY)
Admission: RE | Admit: 2021-08-05 | Discharge: 2021-08-05 | Disposition: A | Discharge status: Home / Self Care | Payer: Medicare Other | Attending: Internal Medicine | Admitting: Internal Medicine

## 2021-08-05 ENCOUNTER — Ambulatory Visit (HOSPITAL_COMMUNITY): Payer: Medicare Other | Admitting: Anesthesiology

## 2021-08-05 ENCOUNTER — Encounter (HOSPITAL_COMMUNITY)
Admission: RE | Disposition: A | Discharge status: Home / Self Care | Payer: Self-pay | Source: Home / Self Care | Attending: Internal Medicine

## 2021-08-05 ENCOUNTER — Ambulatory Visit (HOSPITAL_BASED_OUTPATIENT_CLINIC_OR_DEPARTMENT_OTHER): Payer: Medicare Other | Admitting: Anesthesiology

## 2021-08-05 DIAGNOSIS — I129 Hypertensive chronic kidney disease with stage 1 through stage 4 chronic kidney disease, or unspecified chronic kidney disease: Secondary | ICD-10-CM | POA: Diagnosis not present

## 2021-08-05 DIAGNOSIS — I480 Paroxysmal atrial fibrillation: Secondary | ICD-10-CM | POA: Diagnosis not present

## 2021-08-05 DIAGNOSIS — I5030 Unspecified diastolic (congestive) heart failure: Secondary | ICD-10-CM | POA: Insufficient documentation

## 2021-08-05 DIAGNOSIS — Z8249 Family history of ischemic heart disease and other diseases of the circulatory system: Secondary | ICD-10-CM | POA: Diagnosis not present

## 2021-08-05 DIAGNOSIS — Z79899 Other long term (current) drug therapy: Secondary | ICD-10-CM | POA: Insufficient documentation

## 2021-08-05 DIAGNOSIS — I130 Hypertensive heart and chronic kidney disease with heart failure and stage 1 through stage 4 chronic kidney disease, or unspecified chronic kidney disease: Secondary | ICD-10-CM | POA: Insufficient documentation

## 2021-08-05 DIAGNOSIS — I2510 Atherosclerotic heart disease of native coronary artery without angina pectoris: Secondary | ICD-10-CM | POA: Insufficient documentation

## 2021-08-05 DIAGNOSIS — N183 Chronic kidney disease, stage 3 unspecified: Secondary | ICD-10-CM | POA: Diagnosis not present

## 2021-08-05 DIAGNOSIS — E785 Hyperlipidemia, unspecified: Secondary | ICD-10-CM | POA: Insufficient documentation

## 2021-08-05 DIAGNOSIS — I252 Old myocardial infarction: Secondary | ICD-10-CM | POA: Insufficient documentation

## 2021-08-05 DIAGNOSIS — N189 Chronic kidney disease, unspecified: Secondary | ICD-10-CM | POA: Diagnosis not present

## 2021-08-05 DIAGNOSIS — I4891 Unspecified atrial fibrillation: Secondary | ICD-10-CM | POA: Diagnosis not present

## 2021-08-05 DIAGNOSIS — I5032 Chronic diastolic (congestive) heart failure: Secondary | ICD-10-CM

## 2021-08-05 DIAGNOSIS — Z7901 Long term (current) use of anticoagulants: Secondary | ICD-10-CM | POA: Diagnosis not present

## 2021-08-05 DIAGNOSIS — Z87891 Personal history of nicotine dependence: Secondary | ICD-10-CM | POA: Diagnosis not present

## 2021-08-05 DIAGNOSIS — Z955 Presence of coronary angioplasty implant and graft: Secondary | ICD-10-CM | POA: Diagnosis not present

## 2021-08-05 HISTORY — PX: CARDIOVERSION: SHX1299

## 2021-08-05 LAB — POCT I-STAT, CHEM 8
BUN: 32 mg/dL — ABNORMAL HIGH (ref 8–23)
Calcium, Ion: 1.19 mmol/L (ref 1.15–1.40)
Chloride: 105 mmol/L (ref 98–111)
Creatinine, Ser: 1.7 mg/dL — ABNORMAL HIGH (ref 0.61–1.24)
Glucose, Bld: 97 mg/dL (ref 70–99)
HCT: 39 % (ref 39.0–52.0)
Hemoglobin: 13.3 g/dL (ref 13.0–17.0)
Potassium: 4.3 mmol/L (ref 3.5–5.1)
Sodium: 141 mmol/L (ref 135–145)
TCO2: 27 mmol/L (ref 22–32)

## 2021-08-05 SURGERY — CARDIOVERSION
Anesthesia: General

## 2021-08-05 MED ORDER — SODIUM CHLORIDE 0.9 % IV SOLN
INTRAVENOUS | Status: DC
  Administered 2021-08-05: 500 mL via INTRAVENOUS

## 2021-08-05 MED ORDER — PROPOFOL 10 MG/ML IV BOLUS
INTRAVENOUS | Status: DC | PRN
  Administered 2021-08-05: 70 mg via INTRAVENOUS

## 2021-08-05 MED ORDER — LIDOCAINE 2% (20 MG/ML) 5 ML SYRINGE
INTRAMUSCULAR | Status: DC | PRN
  Administered 2021-08-05: 60 mg via INTRAVENOUS

## 2021-08-05 NOTE — Transfer of Care (Signed)
Immediate Anesthesia Transfer of Care Note ? ?Patient: KALLON CAYLOR ? ?Procedure(s) Performed: CARDIOVERSION ? ?Patient Location: PACU and Endoscopy Unit ? ?Anesthesia Type:General ? ?Level of Consciousness: drowsy ? ?Airway & Oxygen Therapy: Patient Spontanous Breathing and Patient connected to nasal cannula oxygen ? ?Post-op Assessment: Report given to RN and Post -op Vital signs reviewed and stable ? ?Post vital signs: Reviewed and stable ? ?Last Vitals:  ?Vitals Value Taken Time  ?BP    ?Temp    ?Pulse 74 08/05/21 0904  ?Resp 14 08/05/21 0904  ?SpO2 94 % 08/05/21 0904  ?Vitals shown include unvalidated device data. ? ?Last Pain:  ?Vitals:  ? 08/05/21 0802  ?TempSrc: Temporal  ?PainSc: 0-No pain  ?   ? ?  ? ?Complications: No notable events documented. ?

## 2021-08-05 NOTE — Anesthesia Postprocedure Evaluation (Signed)
Anesthesia Post Note ? ?Patient: MAYANK TEUSCHER ? ?Procedure(s) Performed: CARDIOVERSION ? ?  ? ?Patient location during evaluation: Endoscopy ?Anesthesia Type: MAC ?Level of consciousness: awake and alert ?Pain management: pain level controlled ?Vital Signs Assessment: post-procedure vital signs reviewed and stable ?Respiratory status: spontaneous breathing, nonlabored ventilation, respiratory function stable and patient connected to nasal cannula oxygen ?Cardiovascular status: stable and blood pressure returned to baseline ?Postop Assessment: no apparent nausea or vomiting ?Anesthetic complications: no ? ? ?No notable events documented. ? ?Last Vitals:  ?Vitals:  ? 08/05/21 0920 08/05/21 0930  ?BP: (!) 98/49 102/71  ?Pulse: 61 63  ?Resp: 13 15  ?Temp:    ?SpO2: 96% 94%  ?  ?Last Pain:  ?Vitals:  ? 08/05/21 0930  ?TempSrc:   ?PainSc: 0-No pain  ? ? ?  ?  ?  ?  ?  ?  ? ?March Rummage Arjay Jaskiewicz ? ? ? ? ?

## 2021-08-05 NOTE — Anesthesia Procedure Notes (Signed)
Procedure Name: General with mask airway ?Date/Time: 08/05/2021 8:56 AM ?Performed by: Janene Harvey, CRNA ?Pre-anesthesia Checklist: Patient identified, Emergency Drugs available, Suction available and Patient being monitored ?Patient Re-evaluated:Patient Re-evaluated prior to induction ?Oxygen Delivery Method: Ambu bag ?Induction Type: IV induction ?Dental Injury: Teeth and Oropharynx as per pre-operative assessment  ? ? ? ? ?

## 2021-08-05 NOTE — Discharge Instructions (Signed)

## 2021-08-05 NOTE — CV Procedure (Signed)
? ?  Electrical Cardioversion Procedure Note ?ADEEL GUIFFRE ?[id number] ?[birth year] ? ?Procedure: Electrical Cardioversion ?Indications:  Atrial Fibrillation ? ?Time Out: Verified patient identification, verified procedure,medications/allergies/relevent history reviewed, required imaging and test results available.  Performed ? ?Procedure Details ? ?The patient was NPO after midnight. Anesthesia was administered at the beside  by Dr.Stoltzfus with '60mg'$  of lidocaine and 70 mg propofol.  Cardioversion was done with synchronized biphasic defibrillation with AP pads with 200 Joules.  The patient converted to normal sinus rhythm. The patient tolerated the procedure well  ? ?IMPRESSION: ? ?Successful cardioversion of atrial fibrillation ? ? ? ?Shamere Dilworth A Pacer Dorn ?08/05/2021, 9:01 AM ? ? ?

## 2021-08-05 NOTE — Anesthesia Preprocedure Evaluation (Signed)
Anesthesia Evaluation  ?Patient identified by MRN, date of birth, ID band ?Patient awake ? ? ? ?Reviewed: ?Allergy & Precautions, NPO status , Patient's Chart, lab work & pertinent test results ? ?Airway ?Mallampati: II ? ?TM Distance: >3 FB ?Neck ROM: Full ? ? ? Dental ?no notable dental hx. ? ?  ?Pulmonary ?neg pulmonary ROS, former smoker,  ?  ?Pulmonary exam normal ? ? ? ? ? ? ? Cardiovascular ?hypertension, Pt. on medications and Pt. on home beta blockers ?+ CAD, + Past MI and + DVT  ?+ dysrhythmias Atrial Fibrillation  ?Rhythm:Irregular Rate:Normal ? ? ?  ?Neuro/Psych ?negative neurological ROS ? negative psych ROS  ? GI/Hepatic ?negative GI ROS, Neg liver ROS,   ?Endo/Other  ?negative endocrine ROS ? Renal/GU ?CRFRenal disease  ?negative genitourinary ?  ?Musculoskeletal ? ?(+) Arthritis , Osteoarthritis,   ? Abdominal ?Normal abdominal exam  (+)   ?Peds ? Hematology ?negative hematology ROS ?(+)   ?Anesthesia Other Findings ? ? Reproductive/Obstetrics ? ?  ? ? ? ? ? ? ? ? ? ? ? ? ? ?  ?  ? ? ? ? ? ? ? ? ?Anesthesia Physical ?Anesthesia Plan ? ?ASA: 3 ? ?Anesthesia Plan: General  ? ?Post-op Pain Management:   ? ?Induction: Intravenous ? ?PONV Risk Score and Plan: 2 and Propofol infusion and Treatment may vary due to age or medical condition ? ?Airway Management Planned: Mask ? ?Additional Equipment: None ? ?Intra-op Plan:  ? ?Post-operative Plan:  ? ?Informed Consent: I have reviewed the patients History and Physical, chart, labs and discussed the procedure including the risks, benefits and alternatives for the proposed anesthesia with the patient or authorized representative who has indicated his/her understanding and acceptance.  ? ? ? ?Dental advisory given ? ?Plan Discussed with: CRNA ? ?Anesthesia Plan Comments:   ? ? ? ? ? ? ?Anesthesia Quick Evaluation ? ?

## 2021-08-05 NOTE — Interval H&P Note (Signed)
History and Physical Interval Note: ? ?08/05/2021 ?8:14 AM ? ?[place]  has presented today for surgery, with the diagnosis of ATRIAL FIBRILLATION.  The various methods of treatment have been discussed with the patient and family. After consideration of risks, benefits and other options for treatment, the patient has consented to  Procedure(s): ?CARDIOVERSION (N/A) as a surgical intervention.  The patient's history has been reviewed, patient examined, no change in status, stable for surgery.  I have reviewed the patient's chart and labs.  Questions were answered to the patient's satisfaction.   ? ? ?[name] ? ? ?

## 2021-08-05 NOTE — Progress Notes (Unsigned)
Will place order for BNP. [facility] never resulted BNP and patient will need another BNP ordered. Will place order. Patient is having cardioversion today. Will see if BNP can been done while there. ?

## 2021-08-08 LAB — CBC WITH DIFFERENTIAL/PLATELET
Basophils Absolute: 0 10*3/uL (ref 0.0–0.2)
Basos: 0 %
EOS (ABSOLUTE): 0.1 10*3/uL (ref 0.0–0.4)
Eos: 2 %
Hematocrit: 41.8 % (ref 37.5–51.0)
Hemoglobin: 13.8 g/dL (ref 13.0–17.7)
Immature Grans (Abs): 0 10*3/uL (ref 0.0–0.1)
Immature Granulocytes: 0 %
Lymphocytes Absolute: 0.5 10*3/uL — ABNORMAL LOW (ref 0.7–3.1)
Lymphs: 9 %
MCH: 32.4 pg (ref 26.6–33.0)
MCHC: 33 g/dL (ref 31.5–35.7)
MCV: 98 fL — ABNORMAL HIGH (ref 79–97)
Monocytes Absolute: 0.6 10*3/uL (ref 0.1–0.9)
Monocytes: 11 %
Neutrophils Absolute: 4.1 10*3/uL (ref 1.4–7.0)
Neutrophils: 78 %
Platelets: 157 10*3/uL (ref 150–450)
RBC: 4.26 x10E6/uL (ref 4.14–5.80)
RDW: 13.4 % (ref 11.6–15.4)
WBC: 5.3 10*3/uL (ref 3.4–10.8)

## 2021-08-08 LAB — BASIC METABOLIC PANEL
BUN/Creatinine Ratio: 17 (ref 10–24)
BUN: 27 mg/dL (ref 8–27)
CO2: 24 mmol/L (ref 20–29)
Calcium: 8.9 mg/dL (ref 8.6–10.2)
Chloride: 108 mmol/L — ABNORMAL HIGH (ref 96–106)
Creatinine, Ser: 1.61 mg/dL — ABNORMAL HIGH (ref 0.76–1.27)
Glucose: 89 mg/dL (ref 70–99)
Potassium: 4.4 mmol/L (ref 3.5–5.2)
Sodium: 147 mmol/L — ABNORMAL HIGH (ref 134–144)
eGFR: 44 mL/min/{1.73_m2} — ABNORMAL LOW (ref >59)

## 2021-08-08 LAB — PRO B NATRIURETIC PEPTIDE

## 2021-08-09 ENCOUNTER — Encounter (HOSPITAL_COMMUNITY): Payer: Self-pay | Admitting: Internal Medicine

## 2021-08-18 ENCOUNTER — Ambulatory Visit: Payer: Medicare Other | Admitting: Cardiovascular Disease

## 2021-08-24 ENCOUNTER — Other Ambulatory Visit: Payer: Self-pay

## 2021-08-24 MED ORDER — ATORVASTATIN CALCIUM 80 MG PO TABS: ORAL_TABLET | ORAL | 3 refills | Status: DC

## 2021-08-24 NOTE — Progress Notes (Signed)
Called patient to follow up on repeat blood work. Patient needed refill on Atorvastatin. Refill sent in to pharmacy of choice.  ?

## 2021-08-25 DIAGNOSIS — I5032 Chronic diastolic (congestive) heart failure: Secondary | ICD-10-CM | POA: Diagnosis not present

## 2021-08-26 LAB — PRO B NATRIURETIC PEPTIDE: NT-Pro BNP: 5913 pg/mL — ABNORMAL HIGH (ref 0–486)

## 2021-08-29 ENCOUNTER — Telehealth: Payer: Self-pay

## 2021-08-29 DIAGNOSIS — I5189 Other ill-defined heart diseases: Secondary | ICD-10-CM

## 2021-08-29 DIAGNOSIS — R7989 Other specified abnormal findings of blood chemistry: Secondary | ICD-10-CM

## 2021-08-29 DIAGNOSIS — I480 Paroxysmal atrial fibrillation: Secondary | ICD-10-CM

## 2021-08-29 DIAGNOSIS — Z79899 Other long term (current) drug therapy: Secondary | ICD-10-CM

## 2021-08-29 MED ORDER — AMIODARONE HCL 200 MG PO TABS: 200.0000 mg | ORAL_TABLET | Freq: Every day | ORAL | 3 refills | Status: DC

## 2021-08-29 NOTE — H&P (View-Only) (Signed)
Date:  08/31/2021   ID:  WANYA BANGURA, DOB 1945/11/04, MRN [id number]  PCP:  Colon Branch, MD  Cardiologist:  Dr.Rainy Rothman   Chief Complaint: Dyspnea  History of Present Illness:   76 y.o. remote stenting of LAD in [name] . History of DVT, HTN, HLD and CRF Chronic venous Disease with edema and right sided ulcers in past. Same dyspnea 01/2017 BNP and d dimer up V/Q negative started on lasix with improvement HTN and PVCls improved with beta blocker  Echo reviewed 02/01/17 EF 36% grade 2 diastolic mild MR  ETT 64/4/03 normal   04/30/17 Right TKR Alusio no cardiac complications  MRI 4/74/25 with severe disc and facet degeneration multilevel  Cervical decompression with [name] 06/27/17 little improvement Placed on Gabapentin Had repeat surgery at [facility] but again little improvement  Noted new onset afib 07/27/20 in clinic Asymptomatic and rate controlled on lopressor already Started on renal dose xarelto 15 mg dail y   [facility] 07/27/20 with conversion to NSR   Complaining of left ankle edema despite lasix Xarelto and Pradaxa expensive and insurance suggesting coumadin as alternative   In office 07/22/21 he is in flutter again rate 109 Discussed adding AAT with amiodarone and doing [facility] in 2 weeks with f/u Dr [name] to discuss possible future ablation with watchman since affording DOAC such and issue.   His LE edema is from venous insufficiency changed to stronger diuretic demedex  Had successful repeat [facility] on 08/05/21 Done on amiodarone 200 mg daily and pradaxa 150 mg bid   He again indicates he does not want to take chronic blood thinner Volume is down Discussed repeating echo , labs and referral to EF     Past Medical History:  Diagnosis Date   Arthritis    CAD (coronary artery disease)    Chronic kidney disease    DVT (deep venous thrombosis) ([place])    following spinal surgery Nov 2017   HTN (hypertension)    MI (myocardial infarction) ([place])    1999 ; stents placed     Numbness and tingling in both hands    Spondylosis of cervical spine     Past Surgical History:  Procedure Laterality Date   ANTERIOR CERVICAL DECOMP/DISCECTOMY FUSION N/A 06/27/2017   Procedure: ANTERIOR CERVICAL DECOMPRESSION/DISCECTOMY FUSION C3-4;  Surgeon: [name], MD;  Location: [place];  Service: Orthopedics;  Laterality: N/A;  3 hrs   ANTERIOR CERVICAL DECOMP/DISCECTOMY FUSION  01/24/2019   ANTERIOR LAT LUMBAR FUSION N/A 03/01/2016   Procedure: EXTREME LATERAL INTERBODY FUSION  LUMBAR 2-4;  Surgeon: [name], MD;  Location: [place];  Service: Orthopedics;  Laterality: N/A;   CARDIAC CATHETERIZATION     20 yrs ago   CARDIOVERSION N/A 07/27/2020   Procedure: CARDIOVERSION;  Surgeon: [name], MD;  Location: [place];  Service: Cardiovascular;  Laterality: N/A;   CARDIOVERSION N/A 08/05/2021   Procedure: CARDIOVERSION;  Surgeon: [name], MD;  Location: [place];  Service: Cardiovascular;  Laterality: N/A;   JOINT REPLACEMENT     NECK HARDWARE REMOVAL  12/11/2018   RADIOLOGY WITH ANESTHESIA N/A 05/15/2017   Procedure: MRI WITH ANESTHESIA CERVICAL SPINE WITH AND WITHOUT;  Surgeon: Radiologist, Medication, MD;  Location: [place];  Service: Radiology;  Laterality: N/A;   SPINAL FUSION N/A 03/02/2016   Procedure: POSTERIOR SPINAL FUSION INTERBODY L2-S1, DECOMPRESSION L4-S1;  Surgeon: [name], MD;  Location: [place];  Service: Orthopedics;  Laterality: N/A;   TOTAL KNEE ARTHROPLASTY Right 04/30/2017  Procedure: RIGHT TOTAL KNEE ARTHROPLASTY;  Surgeon: [name], MD;  [facility];  Service: Orthopedics;  Laterality: Right;    Current Medications: Prior to Admission medications   Medication Sig Start Date End Date Taking? Authorizing Provider  Ascorbic Acid (VITAMIN C) 1000 MG tablet Take 1,000 mg by mouth daily.    [provider]  aspirin EC 81 MG tablet Take 81 mg by mouth daily.    [provider]  furosemide  (LASIX) 20 MG tablet Take 1 tablet (20 mg total) by mouth daily. 01/25/17   Colon Branch, MD  losartan (COZAAR) 100 MG tablet Take 1 tablet (100 mg total) by mouth daily. 01/05/17   [name], MD  Multiple Vitamin (MULTIVITAMIN WITH MINERALS) TABS tablet Take 1 tablet by mouth daily.    [provider]  rosuvastatin (CRESTOR) 20 MG tablet Take 1 tablet (20 mg total) by mouth at bedtime. January 27, 2017   Colon Branch, MD    Allergies:   Patient has no known allergies.   Social History   Socioeconomic History   Marital status: Married    Spouse name: Not on file   Number of children: 2   Years of education: Not on file   Highest education level: Not on file  Occupational History   Occupation: retired , Pension scheme manager busines   Tobacco Use   Smoking status: Former    Types: Cigarettes    Quit date: 04/26/1983    Years since quitting: 38.3   Smokeless tobacco: Never   Tobacco comments:    quit over 40 years ago   Vaping Use   Vaping Use: Never used  Substance and Sexual Activity   Alcohol use: No   Drug use: No   Sexual activity: Never  Other Topics Concern   Not on file  Social History Narrative   Household pt, wife    [place] from [place] Determinants of Health   Financial Resource Strain: Not on file  Food Insecurity: Not on file  Transportation Needs: Not on file  Physical Activity: Not on file  Stress: Not on file  Social Connections: Not on file     Family History:  The patient's family history includes Cancer - Other in his mother; Heart attack in his father; Uterine cancer in his sister.  ROS:   Please see the history of present illness.    ROS All other systems reviewed and are negative.   PHYSICAL EXAM:   VS:  There were no vitals taken for this visit.    Affect appropriate Healthy:  appears stated age 76: normal Neck supple with no adenopathy JVP normal no bruits no thyromegaly Lungs clear with no wheezing and good diaphragmatic  motion Heart:  S1/S2 no murmur, no rub, gallop or click PMI normal Abdomen: benighn, BS positve, no tenderness, no AAA no bruit.  No HSM or HJR Distal pulses intact with no bruits Plus 2 bilateral edema with stasis LLE worse than right  Neuro non-focal post lumbar surgery  Skin warm and dry Post R TKR     Wt Readings from Last 3 Encounters:  08/05/21 200 lb (90.7 kg)  08/02/21 207 lb (93.9 kg)  07/22/21 209 lb (94.8 kg)      Studies/Labs Reviewed:   EKG:    Jan 27, 2017 SR rate 71 normal PVC nonspecific ST changes 08/31/2021 aflutter rate 105 poor R wave progression   Recent Labs: 07/22/2021: Platelets 157 08/02/2021: ALT 30; TSH 2.00 08/05/2021:  BUN 32; Creatinine, Ser 1.70; Hemoglobin 13.3; Potassium 4.3; Sodium 141 08/25/2021: NT-Pro BNP 5,913   Lipid Panel    Component Value Date/Time   CHOL 166 01/15/2018 1516   TRIG 189 (H) 01/15/2018 1516   HDL 42 01/15/2018 1516   CHOLHDL 4.0 01/15/2018 1516   CHOLHDL 4 06/24/2013 1113   VLDL 16.2 06/24/2013 1113   LDLCALC 86 01/15/2018 1516    Additional studies/ records that were reviewed today include:  AS above    ASSESSMENT & PLAN:     1. CAD s/p remote LAD stenting -  No chest pain. ETT 02/23/17 non ischemic continue ASA and beta blocker and statin   2. HTN- Well controlled.  Continue current medications and low sodium Dash type diet.    3. HLD-  Continue statin labs with primary   4. CKD stage III  Baseline Cr 1.5 stable   5. Ortho:  Post right TKR continue PT/OT     6. Neuropathy:  Severe cervical disease with dense sensory deficit in both UE;s Post cervical decompression F/U [name] He has stopped Gabapentin on his Own as it made him feel poorly   7. Diastolic CHF:   related to PAF as well Demedex now post [facility] update echo while in NSR   8. PAF: [place] on 07/27/20  Difficulty affording DOAC Repeat DCC on amiodarone  08/05/21 successful Will f/u with EP to discuss possible ablation / Watchman as long term strategy. He  does not want to try Tikosyn with hospitalization Decrease amiodarone to 200 mg daily   9. Edema:  LE  Venous insufficiency demedex 20 mg bid d/c lasix Compression stockings f/u primary may need to see wound center in future update echo    BMET/BNP TTE Refer to EP to consider ablation/alternative long term AAT and or Watchman    [employer]

## 2021-08-29 NOTE — Telephone Encounter (Signed)
Per Dr. Johnsie Cancel, That's ok he needs an updated echo as well for PAF and diastolic dysfunction. ?

## 2021-08-29 NOTE — Telephone Encounter (Signed)
Called patient and changed his amiodarone to 200 mg by mouth daily on his medication list per Dr. Johnsie Cancel. Patient has EP appointment with Dr. [name] on 09/21/21. ?

## 2021-08-29 NOTE — Telephone Encounter (Signed)
-----   Message from [name], MD sent at 08/29/2021  3:42 PM EDT ----- ?Should have referral to EP /[place] consider watchman/ablation Decrease amiodarone to 200 mg daily post [facility] repeat 08/05/21  ? ?

## 2021-08-29 NOTE — Telephone Encounter (Signed)
-----   Message from [name], MD sent at 08/28/2021  8:02 PM EDT ----- ?Post [facility] BNP should be better repeat in 3 weeks continue current demedex ?

## 2021-08-29 NOTE — Telephone Encounter (Signed)
The patient has been notified of the result and verbalized understanding.  All questions (if any) were answered. ?Michaelyn Barter, RN 08/29/2021 1:47 PM  ? ?Will place order. ?

## 2021-08-29 NOTE — Progress Notes (Signed)
? ?Date:  08/31/2021  ? ?ID:  Gregory Guerrero, DOB 11-13-1945, MRN [id number] ? ?PCP:  Colon Branch, MD  ?Cardiologist:  Dr.[name]  ? ?Chief Complaint: Dyspnea ? ?History of Present Illness:  ? ?76 y.o. remote stenting of LAD in [name] . History of DVT, HTN, HLD and CRF Chronic venous Disease with edema and right sided ulcers in past. Same dyspnea 01/2017 BNP and d dimer up V/Q negative started on lasix with improvement HTN and PVCls improved with beta blocker ? ?Echo reviewed 02/01/17 EF 37% grade 2 diastolic mild MR  ?ETT 34/2/87 normal  ? ?04/30/17 Right TKR Alusio no cardiac complications ? ?MRI 05/15/17 with severe disc and facet degeneration multilevel  ?Cervical decompression with [name] 06/27/17 little improvement ?Placed on Gabapentin Had repeat surgery at [facility] but again little improvement ? ?Noted new onset afib 07/27/20 in clinic Asymptomatic and rate controlled on lopressor already Started on renal dose xarelto 15 mg dail y  ? ?[facility] 07/27/20 with conversion to NSR  ? ?Complaining of left ankle edema despite lasix Xarelto and Pradaxa expensive and insurance suggesting coumadin as alternative  ? ?In office 07/22/21 he is in flutter again rate 109 Discussed adding AAT with amiodarone and doing [facility] in 2 weeks with f/u Dr [name] to discuss possible future ablation with watchman since affording DOAC such and issue.  ? ?His LE edema is from venous insufficiency changed to stronger diuretic demedex ? ?Had successful repeat [facility] on 08/05/21 Done on amiodarone 200 mg daily and pradaxa 150 mg bid  ? ?He again indicates he does not want to take chronic blood thinner Volume is down Discussed repeating echo , labs and referral to EF ? ? ? ? ?Past Medical History:  ?Diagnosis Date  ? Arthritis   ? CAD (coronary artery disease)   ? Chronic kidney disease   ? DVT (deep venous thrombosis) ([place])   ? following spinal surgery Nov 2017  ? HTN (hypertension)   ? MI (myocardial infarction) ([place])   ? 1999 ; stents placed   ?  Numbness and tingling in both hands   ? Spondylosis of cervical spine   ? ? ?Past Surgical History:  ?Procedure Laterality Date  ? ANTERIOR CERVICAL DECOMP/DISCECTOMY FUSION N/A 06/27/2017  ? Procedure: ANTERIOR CERVICAL DECOMPRESSION/DISCECTOMY FUSION C3-4;  Surgeon: [name], MD;  Location: [place];  Service: Orthopedics;  Laterality: N/A;  3 hrs  ? ANTERIOR CERVICAL DECOMP/DISCECTOMY FUSION  01/24/2019  ? ANTERIOR LAT LUMBAR FUSION N/A 03/01/2016  ? Procedure: EXTREME LATERAL INTERBODY FUSION  LUMBAR 2-4;  Surgeon: [name], MD;  Location: [place];  Service: Orthopedics;  Laterality: N/A;  ? CARDIAC CATHETERIZATION    ? 20 yrs ago  ? CARDIOVERSION N/A 07/27/2020  ? Procedure: CARDIOVERSION;  Surgeon: [name], MD;  Location: [facility]COPY;  Service: Cardiovascular;  Laterality: N/A;  ? CARDIOVERSION N/A 08/05/2021  ? Procedure: CARDIOVERSION;  Surgeon: [name], MD;  Location: [facility]OSCOPY;  Service: Cardiovascular;  Laterality: N/A;  ? JOINT REPLACEMENT    ? NECK HARDWARE REMOVAL  12/11/2018  ? RADIOLOGY WITH ANESTHESIA N/A 05/15/2017  ? Procedure: MRI WITH ANESTHESIA CERVICAL SPINE WITH AND WITHOUT;  Surgeon: Radiologist, Medication, MD;  Location: [place];  Service: Radiology;  Laterality: N/A;  ? SPINAL FUSION N/A 03/02/2016  ? Procedure: POSTERIOR SPINAL FUSION INTERBODY L2-S1, DECOMPRESSION L4-S1;  Surgeon: [name], MD;  Location: [place];  Service: Orthopedics;  Laterality: N/A;  ? TOTAL KNEE ARTHROPLASTY Right 04/30/2017  ?  Procedure: RIGHT TOTAL KNEE ARTHROPLASTY;  Surgeon: [name], MD;  [facility];  Service: Orthopedics;  Laterality: Right;  ? ? ?Current Medications: ?Prior to Admission medications   ?Medication Sig Start Date End Date Taking? Authorizing Provider  ?Ascorbic Acid (VITAMIN C) 1000 MG tablet Take 1,000 mg by mouth daily.    [provider]  ?aspirin EC 81 MG tablet Take 81 mg by mouth daily.    [provider]  ?furosemide  (LASIX) 20 MG tablet Take 1 tablet (20 mg total) by mouth daily. 01/25/17   Colon Branch, MD  ?losartan (COZAAR) 100 MG tablet Take 1 tablet (100 mg total) by mouth daily. 01/05/17   [name], MD  ?Multiple Vitamin (MULTIVITAMIN WITH MINERALS) TABS tablet Take 1 tablet by mouth daily.    [provider]  ?rosuvastatin (CRESTOR) 20 MG tablet Take 1 tablet (20 mg total) by mouth at bedtime. 01/24/17   Colon Branch, MD  ? ? ?Allergies:   Patient has no known allergies.  ? ?Social History  ? ?Socioeconomic History  ? Marital status: Married  ?  Spouse name: Not on file  ? Number of children: 2  ? Years of education: Not on file  ? Highest education level: Not on file  ?Occupational History  ? Occupation: retired , Pension scheme manager busines   ?Tobacco Use  ? Smoking status: Former  ?  Types: Cigarettes  ?  Quit date: 04/26/1983  ?  Years since quitting: 38.3  ? Smokeless tobacco: Never  ? Tobacco comments:  ?  quit over 40 years ago   ?Vaping Use  ? Vaping Use: Never used  ?Substance and Sexual Activity  ? Alcohol use: No  ? Drug use: No  ? Sexual activity: Never  ?Other Topics Concern  ? Not on file  ?Social History Narrative  ? Household pt, wife   ? Moved from [place]   ? ?Social Determinants of Health  ? ?Financial Resource Strain: Not on file  ?Food Insecurity: Not on file  ?Transportation Needs: Not on file  ?Physical Activity: Not on file  ?Stress: Not on file  ?Social Connections: Not on file  ?  ? ?Family History:  The patient's family history includes Cancer - Other in his mother; Heart attack in his father; Uterine cancer in his sister. ? ?ROS:   ?Please see the history of present illness.    ?ROS All other systems reviewed and are negative. ? ? ?PHYSICAL EXAM:   ?VS:  There were no vitals taken for this visit.   ? ?Affect appropriate ?Healthy:  appears stated age ?HEENT: normal ?Neck supple with no adenopathy ?JVP normal no bruits no thyromegaly ?Lungs clear with no wheezing and good diaphragmatic  motion ?Heart:  S1/S2 no murmur, no rub, gallop or click ?PMI normal ?Abdomen: benighn, BS positve, no tenderness, no AAA ?no bruit.  No HSM or HJR ?Distal pulses intact with no bruits ?Plus 2 bilateral edema with stasis LLE worse than right  ?Neuro non-focal post lumbar surgery  ?Skin warm and dry ?Post R TKR  ? ? ? ?Wt Readings from Last 3 Encounters:  ?08/05/21 200 lb (90.7 kg)  ?08/02/21 207 lb (93.9 kg)  ?07/22/21 209 lb (94.8 kg)  ?  ? ? ?Studies/Labs Reviewed:  ? ?EKG:    01/24/17 SR rate 71 normal PVC nonspecific ST changes 08/31/2021 aflutter rate 105 poor R wave progression  ? ?Recent Labs: ?07/22/2021: Platelets 157 ?08/02/2021: ALT 30; TSH 2.00 ?08/05/2021:  BUN 32; Creatinine, Ser 1.70; Hemoglobin 13.3; Potassium 4.3; Sodium 141 ?08/25/2021: NT-Pro BNP 5,913  ? ?Lipid Panel ?   ?Component Value Date/Time  ? CHOL 166 01/15/2018 1516  ? TRIG 189 (H) 01/15/2018 1516  ? HDL 42 01/15/2018 1516  ? CHOLHDL 4.0 01/15/2018 1516  ? CHOLHDL 4 06/24/2013 1113  ? VLDL 16.2 06/24/2013 1113  ? [place] 86 01/15/2018 1516  ? ? ?Additional studies/ records that were reviewed today include:  ?AS above ? ? ? ?ASSESSMENT & PLAN:  ?  ? ?1. CAD s/p remote LAD stenting ?-  No chest pain. ETT 02/23/17 non ischemic continue ASA and beta blocker and statin  ? ?2. HTN- Well controlled.  Continue current medications and low sodium Dash type diet.   ? ?3. HLD-  Continue statin labs with primary  ? ?4. CKD stage III  Baseline Cr 1.5 stable  ? ?5. Ortho:  Post right TKR continue PT/OT    ? ?6. Neuropathy:  Severe cervical disease with dense sensory deficit in both UE;s Post cervical decompression F/U [name] He has stopped Gabapentin on his Own as it made him feel poorly  ? ?7. Diastolic CHF:   related to PAF as well Demedex now post [facility] update echo while in NSR  ? ?8. PAF: [place] on 07/27/20  Difficulty affording DOAC Repeat DCC on amiodarone  08/05/21 successful Will f/u with EP to discuss possible ablation / Watchman as long term strategy. He  does not want to try Tikosyn with hospitalization Decrease amiodarone to 200 mg daily  ? ?9. Edema:  LE  Venous insufficiency demedex 20 mg bid d/c lasix Compression stockings f/u primary may need to see wound center in

## 2021-08-31 ENCOUNTER — Ambulatory Visit (INDEPENDENT_AMBULATORY_CARE_PROVIDER_SITE_OTHER): Payer: Medicare Other | Admitting: Cardiovascular Disease

## 2021-08-31 ENCOUNTER — Encounter: Payer: Self-pay | Admitting: Cardiovascular Disease

## 2021-08-31 VITALS — BP 128/80 | HR 53 | Ht 68.5 in | Wt 191.8 lb

## 2021-08-31 DIAGNOSIS — I2510 Atherosclerotic heart disease of native coronary artery without angina pectoris: Secondary | ICD-10-CM | POA: Diagnosis not present

## 2021-08-31 DIAGNOSIS — R7989 Other specified abnormal findings of blood chemistry: Secondary | ICD-10-CM

## 2021-08-31 DIAGNOSIS — E782 Mixed hyperlipidemia: Secondary | ICD-10-CM

## 2021-08-31 DIAGNOSIS — I4819 Other persistent atrial fibrillation: Secondary | ICD-10-CM | POA: Diagnosis not present

## 2021-08-31 DIAGNOSIS — I5189 Other ill-defined heart diseases: Secondary | ICD-10-CM | POA: Diagnosis not present

## 2021-08-31 DIAGNOSIS — Z7901 Long term (current) use of anticoagulants: Secondary | ICD-10-CM

## 2021-08-31 DIAGNOSIS — Z5181 Encounter for therapeutic drug level monitoring: Secondary | ICD-10-CM

## 2021-08-31 NOTE — Patient Instructions (Signed)
Medication Instructions:  ?Your physician recommends that you continue on your current medications as directed. Please refer to the Current Medication list given to you today. ? ?*If you need a refill on your cardiac medications before your next appointment, please call your pharmacy* ? ?Lab Work: ?Your physician recommends that you return for lab work on the same day as your echocardiogram for a BMET and BNP ? ?If you have labs (blood work) drawn today and your tests are completely normal, you will receive your results only by: ?MyChart Message (if you have MyChart) OR ?A paper copy in the mail ?If you have any lab test that is abnormal or we need to change your treatment, we will call you to review the results. ? ?Testing/Procedures: ?Your physician has requested that you have an echocardiogram. Echocardiography is a painless test that uses sound waves to create images of your heart. It provides your doctor with information about the size and shape of your heart and how well your heart?s chambers and valves are working. This procedure takes approximately one hour. There are no restrictions for this procedure. ? ?Follow-Up: ?At [facility], you and your health needs are our priority.  As part of our continuing mission to provide you with exceptional heart care, we have created designated Provider Care Teams.  These Care Teams include your primary Cardiologist (physician) and Advanced Practice Providers (APPs -  Physician Assistants and Nurse Practitioners) who all work together to provide you with the care you need, when you need it. ? ?We recommend signing up for the patient portal called "MyChart".  Sign up information is provided on this After Visit Summary.  MyChart is used to connect with patients for Virtual Visits (Telemedicine).  Patients are able to view lab/test results, encounter notes, upcoming appointments, etc.  Non-urgent messages can be sent to your provider as well.   ?To learn more about what  you can do with MyChart, go to [email or web address].   ? ?Your next appointment:   ?6 month(s) ? ?The format for your next appointment:   ?In Person ? ?Provider:   ?Jenkins Rouge, MD { ? ?Other Instructions ?You have been referred to Dr. [name] to discuss ablation and watchman. ? ? ?Important Information About Sugar ? ? ? ? ?  ?

## 2021-09-15 ENCOUNTER — Other Ambulatory Visit: Payer: Medicare Other | Admitting: *Deleted

## 2021-09-15 ENCOUNTER — Ambulatory Visit (HOSPITAL_COMMUNITY): Payer: Medicare Other | Attending: Cardiology

## 2021-09-15 DIAGNOSIS — I509 Heart failure, unspecified: Secondary | ICD-10-CM | POA: Diagnosis not present

## 2021-09-15 DIAGNOSIS — I4819 Other persistent atrial fibrillation: Secondary | ICD-10-CM | POA: Diagnosis not present

## 2021-09-15 DIAGNOSIS — R600 Localized edema: Secondary | ICD-10-CM | POA: Diagnosis not present

## 2021-09-15 DIAGNOSIS — I5189 Other ill-defined heart diseases: Secondary | ICD-10-CM

## 2021-09-15 DIAGNOSIS — I480 Paroxysmal atrial fibrillation: Secondary | ICD-10-CM | POA: Diagnosis not present

## 2021-09-15 DIAGNOSIS — E859 Amyloidosis, unspecified: Secondary | ICD-10-CM | POA: Diagnosis not present

## 2021-09-15 LAB — ECHOCARDIOGRAM COMPLETE
Area-P 1/2: 3.37 cm2
S' Lateral: 3.5 cm

## 2021-09-16 ENCOUNTER — Telehealth: Payer: Self-pay

## 2021-09-16 DIAGNOSIS — R7989 Other specified abnormal findings of blood chemistry: Secondary | ICD-10-CM

## 2021-09-16 LAB — BASIC METABOLIC PANEL
BUN/Creatinine Ratio: 21 (ref 10–24)
BUN: 47 mg/dL — ABNORMAL HIGH (ref 8–27)
CO2: 24 mmol/L (ref 20–29)
Calcium: 9 mg/dL (ref 8.6–10.2)
Chloride: 100 mmol/L (ref 96–106)
Creatinine, Ser: 2.28 mg/dL — ABNORMAL HIGH (ref 0.76–1.27)
Glucose: 76 mg/dL (ref 70–99)
Potassium: 4.5 mmol/L (ref 3.5–5.2)
Sodium: 142 mmol/L (ref 134–144)
eGFR: 29 mL/min/{1.73_m2} — ABNORMAL LOW (ref >59)

## 2021-09-16 LAB — PRO B NATRIURETIC PEPTIDE: NT-Pro BNP: 6441 pg/mL — ABNORMAL HIGH (ref 0–486)

## 2021-09-16 NOTE — Telephone Encounter (Signed)
-----   Message from [name], MD sent at 09/16/2021  7:47 AM EDT ----- Cr up a bit BNP pending echo looked ok have him see CHF clinic may benefit from right heart cath

## 2021-09-16 NOTE — Telephone Encounter (Signed)
The patient's wife has been notified of the result (echocardiogram and lab work) and verbalized understanding.  All questions (if any) were answered. Michaelyn Barter, RN 09/16/2021 10:07 AM   Placed order for referral.

## 2021-09-19 ENCOUNTER — Encounter: Payer: Self-pay | Admitting: Cardiovascular Disease

## 2021-09-19 ENCOUNTER — Telehealth (HOSPITAL_COMMUNITY): Payer: Self-pay | Admitting: *Deleted

## 2021-09-19 DIAGNOSIS — I5032 Chronic diastolic (congestive) heart failure: Secondary | ICD-10-CM

## 2021-09-19 NOTE — Telephone Encounter (Signed)
Pts wife called our office to schedule heart cath pts wife said this needed to be scheduled within one week. Pt is not a patient of the CHF clinic but a referral was placed from Dr.Nishan's office (see note below). Per Adline Potter pt needs the next available new patient appointment which is July 31st and in the meantime Dr.Nishan's office can order and schedule a cath with the cath lab. Pts wife aware and declined new pt appointment as she thought patient would only be scheduling the procedure not seeing a new cardiologist.   ----- Message from [name], MD sent at 09/16/2021  7:47 AM EDT ----- Cr up a bit BNP pending echo looked ok have him see CHF clinic may benefit from right heart cath   Michaelyn Barter, RN   Note The patient's wife has been notified of the result (echocardiogram and lab work) and verbalized understanding.  All questions (if any) were answered. Michaelyn Barter, RN 09/16/2021 10:07 AM    Placed order for referral.

## 2021-09-21 ENCOUNTER — Ambulatory Visit (INDEPENDENT_AMBULATORY_CARE_PROVIDER_SITE_OTHER): Payer: Medicare Other | Admitting: Cardiology

## 2021-09-21 ENCOUNTER — Encounter: Payer: Self-pay | Admitting: Cardiology

## 2021-09-21 VITALS — BP 124/72 | HR 55 | Ht 66.0 in | Wt 195.6 lb

## 2021-09-21 DIAGNOSIS — I2510 Atherosclerotic heart disease of native coronary artery without angina pectoris: Secondary | ICD-10-CM

## 2021-09-21 DIAGNOSIS — I4819 Other persistent atrial fibrillation: Secondary | ICD-10-CM | POA: Diagnosis not present

## 2021-09-21 DIAGNOSIS — I10 Essential (primary) hypertension: Secondary | ICD-10-CM

## 2021-09-21 NOTE — Patient Instructions (Addendum)
Medication Instructions:  Your physician recommends that you continue on your current medications as directed. Please refer to the Current Medication list given to you today. *If you need a refill on your cardiac medications before your next appointment, please call your pharmacy*  Lab Work: Aug 25  If you have labs (blood work) drawn today and your tests are completely normal, you will receive your results only by: [place] (if you have MyChart) OR A paper copy in the mail If you have any lab test that is abnormal or we need to change your treatment, we will call you to review the results.  Testing/Procedures: Your physician has requested that you have cardiac CT. Cardiac computed tomography (CT) is a painless test that uses an x-ray machine to take clear, detailed pictures of your heart. For further information please visit [email or web address]. Please follow instruction sheet as given.   Your physician has recommended that you have an ablation. Catheter ablation is a medical procedure used to treat some cardiac arrhythmias (irregular heartbeats). During catheter ablation, a long, thin, flexible tube is put into a blood vessel in your groin (upper thigh), or neck. This tube is called an ablation catheter. It is then guided to your heart through the blood vessel. Radio frequency waves destroy small areas of heart tissue where abnormal heartbeats may cause an arrhythmia to start. Please see the instruction sheet given to you today.   Follow-Up: At [facility], you and your health needs are our priority.  As part of our continuing mission to provide you with exceptional heart care, we have created designated Provider Care Teams.  These Care Teams include your primary Cardiologist (physician) and Advanced Practice Providers (APPs -  Physician Assistants and Nurse Practitioners) who all work together to provide you with the care you need, when you need it.  Your physician wants you to  follow-up in: Ablation date is Sept 15. Otila Kluver RN will call you with CT and ablation instructions.   Lenice Llamas, the Watchman Nurse Navigator, will call you after your CT once the [facility] Team has reviewed your imaging for an update on proceedings. [place]'s direct number is [phone] if you need assistance.   We recommend signing up for the patient portal called "MyChart".  Sign up information is provided on this After Visit Summary.  MyChart is used to connect with patients for Virtual Visits (Telemedicine).  Patients are able to view lab/test results, encounter notes, upcoming appointments, etc.  Non-urgent messages can be sent to your provider as well.   To learn more about what you can do with MyChart, go to [email or web address].    Any Other Special Instructions Will Be Listed Below (If Applicable).  Cardiac Ablation Cardiac ablation is a procedure to destroy (ablate) some heart tissue that is sending bad signals. These bad signals cause problems in heart rhythm. The heart has many areas that make these signals. If there are problems in these areas, they can make the heart beat in a way that is not normal. Destroying some tissues can help make the heart rhythm normal. Tell your doctor about: Any allergies you have. All medicines you are taking. These include vitamins, herbs, eye drops, creams, and over-the-counter medicines. Any problems you or family members have had with medicines that make you fall asleep (anesthetics). Any blood disorders you have. Any surgeries you have had. Any medical conditions you have, such as kidney failure. Whether you are pregnant or may be pregnant. What are the risks? This is a  safe procedure. But problems may occur, including: Infection. Bruising and bleeding. Bleeding into the chest. Stroke or blood clots. Damage to nearby areas of your body. Allergies to medicines or dyes. The need for a pacemaker if the normal system is damaged. Failure of the  procedure to treat the problem. What happens before the procedure? Medicines Ask your doctor about: Changing or stopping your normal medicines. This is important. Taking aspirin and ibuprofen. Do not take these medicines unless your doctor tells you to take them. Taking other medicines, vitamins, herbs, and supplements. General instructions Follow instructions from your doctor about what you cannot eat or drink. Plan to have someone take you home from the hospital or clinic. If you will be going home right after the procedure, plan to have someone with you for 24 hours. Ask your doctor what steps will be taken to prevent infection. What happens during the procedure?  An IV tube will be put into one of your veins. You will be given a medicine to help you relax. The skin on your neck or groin will be numbed. A cut (incision) will be made in your neck or groin. A needle will be put through your cut and into a large vein. A tube (catheter) will be put into the needle. The tube will be moved to your heart. Dye may be put through the tube. This helps your doctor see your heart. Small devices (electrodes) on the tube will send out signals. A type of energy will be used to destroy some heart tissue. The tube will be taken out. Pressure will be held on your cut. This helps stop bleeding. A bandage will be put over your cut. The exact procedure may vary among doctors and hospitals. What happens after the procedure? You will be watched until you leave the hospital or clinic. This includes checking your heart rate, breathing rate, oxygen, and blood pressure. Your cut will be watched for bleeding. You will need to lie still for a few hours. Do not drive for 24 hours or as long as your doctor tells you. Summary Cardiac ablation is a procedure to destroy some heart tissue. This is done to treat heart rhythm problems. Tell your doctor about any medical conditions you may have. Tell him or her about  all medicines you are taking to treat them. This is a safe procedure. But problems may occur. These include infection, bruising, bleeding, and damage to nearby areas of your body. Follow what your doctor tells you about food and drink. You may also be told to change or stop some of your medicines. After the procedure, do not drive for 24 hours or as long as your doctor tells you. This information is not intended to replace advice given to you by your health care provider. Make sure you discuss any questions you have with your health care provider. Document Revised: 03/06/2019 Document Reviewed: 03/06/2019 Elsevier Patient Education  [place].   Left Atrial Appendage Closure Device Implantation  Left atrial appendage (LAA) closure device implantation is a procedure to put a small device in the LAA of the heart. The LAA is a small sac in the wall of the heart's left upper chamber. Blood clots can form in the LAA in people with atrial fibrillation (AFib). The device closes the LAA to help prevent a blood clot and stroke. AFib is a type of irregular or rapid heartbeat (arrhythmia). There is an increased risk of blood clots and stroke with AFib. This procedure helps  to reduce that risk. Tell a health care provider about: Any allergies you have. All medicines you are taking, including vitamins, herbs, eye drops, creams, and over-the-counter medicines. Any problems you or family members have had with anesthetic medicines. Any blood disorders you have. Any surgeries you have had. Any medical conditions you have. Whether you are pregnant or may be pregnant. What are the risks? Generally, this is a safe procedure. However, problems may occur, including: Infection. Bleeding. Allergic reactions to medicines or dyes. Damage to nearby structures or organs. Heart attack. Stroke. Blood clots. Changes in heart rhythm. Device failure. What happens before the procedure? Staying  hydrated Follow instructions from your health care provider about hydration, which may include: Up to 2 hours before the procedure - you may continue to drink clear liquids, such as water, clear fruit juice, black coffee, and plain tea. Eating and drinking restrictions Follow instructions from your health care provider about eating and drinking, which may include: 8 hours before the procedure - stop eating heavy meals or foods, such as meat, fried foods, or fatty foods. 6 hours before the procedure - stop eating light meals or foods, such as toast or cereal. 6 hours before the procedure - stop drinking milk or drinks that contain milk. 2 hours before the procedure - stop drinking clear liquids. Medicines Ask your health care provider about: Changing or stopping your regular medicines. This is especially important if you are taking diabetes medicines or blood thinners. Taking medicines such as aspirin and ibuprofen. These medicines can thin your blood. Do not take these medicines unless your health care provider tells you to take them. Taking over-the-counter medicines, vitamins, herbs, and supplements. Tests You may have blood tests and a physical exam. You may have an electrocardiogram (ECG). This test checks your heart's electrical patterns and rhythms. General instructions Do not use any products that contain nicotine or tobacco. These include cigarettes, chewing tobacco, and vaping devices, such as e-cigarettes. If you need help quitting, ask your health care provider. Ask your health care provider what steps will be taken to help prevent infection. These steps may include: Removing hair at the surgery site. Washing skin with a germ-killing soap. Taking antibiotic medicine. Plan to have a responsible adult take you home from the hospital or clinic. Plan to have a responsible adult care for you for the time you are told after you leave the hospital or clinic. This is important. What  happens during the procedure? An IV will be inserted into one of your veins. You will be given one or more of the following: A medicine to help you relax (sedative). A medicine to make you fall asleep (general anesthetic). A small incision will be made in your groin area. A small wire will be put through the incision and into a blood vessel. Dye may be injected so X-rays can be used to guide the wire through the blood vessel. A long, thin tube (catheter) will be put over the small wire and moved up through the blood vessel to reach your heart. The closure device will be moved through the catheter until it reaches your heart. A small hole will be made in the septum (transseptal puncture). The septum is a thin tissue that separates the upper two chambers of the heart. The device will be placed so that it closes the LAA. X-rays will be done to make sure the device is in the right place. The catheter and wire will be removed. The closure device will  remain in your heart. After pressure is applied over the catheter site to prevent bleeding, a bandage (dressing) will be placed over the site where the catheter was inserted. The procedure may vary among health care providers and hospitals. What happens after the procedure? Your blood pressure, heart rate, breathing rate, and blood oxygen level will be monitored until you leave the hospital or clinic. You may have to wear compression stockings. These stockings help to prevent blood clots and reduce swelling in your legs. If you were given a sedative during the procedure, it can affect you for several hours. Do not drive or operate machinery until your health care provider says it is safe. You may be given pain medicine. You may need to drink more fluids to wash (flush) the dye out of your body. Drink enough fluid to keep your urine pale yellow. Take over-the-counter and prescription medicines only as told by your health care provider. This is especially  important if you were given blood thinners. Summary Left atrial appendage (LAA) closure device implantation is a procedure that is done to put a small device in the LAA of the heart. The LAA is a small sac in the wall of the heart's left upper chamber. The device closes the LAA to prevent stroke and other problems. Follow instructions from your health care provider before and after the procedure. This information is not intended to replace advice given to you by your health care provider. Make sure you discuss any questions you have with your health care provider. Document Revised: 12/11/2019 Document Reviewed: 12/11/2019 Elsevier Patient Education  [employer].

## 2021-09-21 NOTE — Progress Notes (Signed)
Electrophysiology Office Note:    Date:  09/21/2021   ID:  Gregory Guerrero, DOB 01-31-1946, MRN [id number]  PCP:  Colon Branch, MD  [facility] Cardiologist:  Jenkins Rouge, MD  [facility] Electrophysiologist:  Vickie Epley, MD   Referring MD: [name], MD   Chief Complaint: Persistent atrial fibrillation  History of Present Illness:    Gregory Guerrero is a 76 y.o. male who presents for an evaluation of persistent atrial fibrillation at the request of Dr. Johnsie Cancel. Their medical history includes coronary artery disease, chronic diastolic heart failure, provoked DVT following spinal surgery, hypertension, myocardial infarction post PCI.  The patient last saw Dr. Johnsie Cancel Aug 31, 2021.  His atrial fibrillation was diagnosed in April 2022.  He underwent a cardioversion at that time.  He had a recurrence of his arrhythmia following the cardioversion.  Antiarrhythmic drugs and catheter ablation were discussed with the patient given his history of diastolic heart failure.  He is currently taking amiodarone.  He is on Pradaxa for stroke prophylaxis.  He also expressed an interest in avoiding long-term exposure anticoagulation given the associated costs and risks.  He would like to discuss watchman implant as a mechanism to achieve that goal.     Past Medical History:  Diagnosis Date   Arthritis    CAD (coronary artery disease)    Chronic kidney disease    DVT (deep venous thrombosis) (HCC)    following spinal surgery Nov 2017   HTN (hypertension)    MI (myocardial infarction) ([place])    1999 ; stents placed    Numbness and tingling in both hands    Spondylosis of cervical spine     Past Surgical History:  Procedure Laterality Date   ANTERIOR CERVICAL DECOMP/DISCECTOMY FUSION N/A 06/27/2017   Procedure: ANTERIOR CERVICAL DECOMPRESSION/DISCECTOMY FUSION C3-4;  Surgeon: [name], MD;  Location: [place];  Service: Orthopedics;  Laterality: N/A;  3 hrs   ANTERIOR CERVICAL  DECOMP/DISCECTOMY FUSION  01/24/2019   ANTERIOR LAT LUMBAR FUSION N/A 03/01/2016   Procedure: EXTREME LATERAL INTERBODY FUSION  LUMBAR 2-4;  Surgeon: [name], MD;  Location: [place];  Service: Orthopedics;  Laterality: N/A;   CARDIAC CATHETERIZATION     20 yrs ago   CARDIOVERSION N/A 07/27/2020   Procedure: CARDIOVERSION;  Surgeon: [name], MD;  Location: [place];  Service: Cardiovascular;  Laterality: N/A;   CARDIOVERSION N/A 08/05/2021   Procedure: CARDIOVERSION;  Surgeon: [name], MD;  Location: [place];  Service: Cardiovascular;  Laterality: N/A;   JOINT REPLACEMENT     NECK HARDWARE REMOVAL  12/11/2018   RADIOLOGY WITH ANESTHESIA N/A 05/15/2017   Procedure: MRI WITH ANESTHESIA CERVICAL SPINE WITH AND WITHOUT;  Surgeon: Radiologist, Medication, MD;  Location: [place];  Service: Radiology;  Laterality: N/A;   SPINAL FUSION N/A 03/02/2016   Procedure: POSTERIOR SPINAL FUSION INTERBODY L2-S1, DECOMPRESSION L4-S1;  Surgeon: [name], MD;  Location: [place];  Service: Orthopedics;  Laterality: N/A;   TOTAL KNEE ARTHROPLASTY Right 04/30/2017   Procedure: RIGHT TOTAL KNEE ARTHROPLASTY;  Surgeon: [name], MD;  [facility];  Service: Orthopedics;  Laterality: Right;    Current Medications: Current Meds  Medication Sig   acetaminophen (TYLENOL) 650 MG CR tablet Take 650 mg by mouth every 8 (eight) hours as needed for pain.   amiodarone (PACERONE) 200 MG tablet Take 1 tablet (200 mg total) by mouth daily.   Ascorbic Acid (VITAMIN C) 1000 MG tablet Take  1,000 mg by mouth daily.   aspirin EC 81 MG tablet Take 81 mg by mouth every evening.   atorvastatin (LIPITOR) 80 MG tablet TAKE 1 TABLET('80MG'$ ) BY MOUTH EVERY OTHER DAY AND TAKE 1/2 TABLET BY MOUTH ON ALTERNATE DAYS   dabigatran (PRADAXA) 150 MG CAPS capsule Take 1 capsule (150 mg total) by mouth 2 (two) times daily.   metoprolol tartrate (LOPRESSOR) 25 MG tablet Take 1 tablet (25 mg total) by  mouth 2 (two) times daily.   Multiple Vitamin (MULTIVITAMIN) capsule Take 1 capsule by mouth daily.   OVER THE COUNTER MEDICATION Apply 1 application topically daily as needed (neuropathy). CBD cream   pregabalin (LYRICA) 75 MG capsule Take 1 capsule (75 mg total) by mouth 2 (two) times daily.   torsemide (DEMADEX) 20 MG tablet Take 1 tablet (20 mg total) by mouth 2 (two) times daily.     Allergies:   Patient has no known allergies.   Social History   Socioeconomic History   Marital status: Married    Spouse name: Not on file   Number of children: 2   Years of education: Not on file   Highest education level: Not on file  Occupational History   Occupation: retired , Pension scheme manager busines   Tobacco Use   Smoking status: Former    Types: Cigarettes    Quit date: 04/26/1983    Years since quitting: 38.4   Smokeless tobacco: Never   Tobacco comments:    quit over 40 years ago   Vaping Use   Vaping Use: Never used  Substance and Sexual Activity   Alcohol use: No   Drug use: No   Sexual activity: Never  Other Topics Concern   Not on file  Social History Narrative   Household pt, wife    [place] from [place] Determinants of Health   Financial Resource Strain: Not on file  Food Insecurity: Not on file  Transportation Needs: Not on file  Physical Activity: Not on file  Stress: Not on file  Social Connections: Not on file     Family History: The patient's family history includes Cancer - Other in his mother; Heart attack in his father; Uterine cancer in his sister. There is no history of Colon cancer or Prostate cancer.  ROS:   Please see the history of present illness.    All other systems reviewed and are negative.  EKGs/Labs/Other Studies Reviewed:    The following studies were reviewed today:  September 15, 2021 echo personally reviewed Normal left ventricular function, 60% Normal right ventricular function Dilated left and right atria Mild MR   Aug 31, 2021  EKG shows sinus bradycardia with a ventricular rate of 53 bpm.  Long first-degree AV delay with a PR interval of 308 ms.  Late precordial transition.  Left axis deviation.   Recent Labs: 07/22/2021: Platelets 157 08/02/2021: ALT 30; TSH 2.00 08/05/2021: Hemoglobin 13.3 09/15/2021: BUN 47; Creatinine, Ser 2.28; NT-Pro BNP 6,441; Potassium 4.5; Sodium 142  Recent Lipid Panel    Component Value Date/Time   CHOL 166 01/15/2018 1516   TRIG 189 (H) 01/15/2018 1516   HDL 42 01/15/2018 1516   CHOLHDL 4.0 01/15/2018 1516   CHOLHDL 4 06/24/2013 1113   VLDL 16.2 06/24/2013 1113   LDLCALC 86 01/15/2018 1516    Physical Exam:    VS:  BP 124/72   Pulse (!) 55   Ht '5\' 6"'$  (1.676 m)   Wt 195 lb 9.6  oz (88.7 kg)   SpO2 95%   BMI 31.57 kg/m     Wt Readings from Last 3 Encounters:  09/21/21 195 lb 9.6 oz (88.7 kg)  08/31/21 191 lb 12.8 oz (87 kg)  08/05/21 200 lb (90.7 kg)     GEN:  Well nourished, well developed in no acute distress.  Elderly.  Appears younger than stated age. HEENT: Normal NECK: No JVD; No carotid bruits LYMPHATICS: No lymphadenopathy CARDIAC: RRR, no murmurs, rubs, gallops RESPIRATORY:  Clear to auscultation without rales, wheezing or rhonchi  ABDOMEN: Soft, non-tender, non-distended MUSCULOSKELETAL: 1+ bilateral lower extremity pitting edema; No deformity  SKIN: Warm and dry NEUROLOGIC:  Alert and oriented x 3 PSYCHIATRIC:  Normal affect       ASSESSMENT:    1. Persistent atrial fibrillation ([place])   2. Primary hypertension   3. Coronary artery disease involving native coronary artery of native heart without angina pectoris    PLAN:    In order of problems listed above:  #Persistent atrial fibrillation Currently on amiodarone but is interested in a rhythm control strategy that avoids long-term exposure to antiarrhythmic drug therapy.  Tikosyn has been previously discussed with the patient but the patient declined hospitalization for drug  initiation.  Discussed treatment options today for his AF including antiarrhythmic drug therapy and ablation. Discussed risks, recovery and likelihood of success. Discussed potential need for repeat ablation procedures and antiarrhythmic drugs after an initial ablation. They wish to proceed with scheduling.  Risk, benefits, and alternatives to EP study and radiofrequency ablation for afib were also discussed in detail today. These risks include but are not limited to stroke, bleeding, vascular damage, tamponade, perforation, damage to the esophagus, lungs, and other structures, pulmonary vein stenosis, worsening renal function, and death. The patient understands these risk and wishes to proceed.  We will therefore proceed with catheter ablation at the next available time.  Carto, ICE, anesthesia are requested for the procedure.  Will also obtain CT PV protocol prior to the procedure to exclude LAA thrombus and further evaluate atrial anatomy.   -----------  I have seen Gregory Guerrero in the office today who is being considered for a Watchman left atrial appendage closure device. I believe they will benefit from this procedure given their history of atrial fibrillation, CHA2DS2-VASc score of 4 and unadjusted ischemic stroke rate of 4.8% per year. The patient's chart has been reviewed and I feel that they would be a candidate for short term oral anticoagulation after Watchman implant.   It is my belief that after undergoing a LAA closure procedure, Gregory Guerrero will not need long term anticoagulation which eliminates anticoagulation side effects and major bleeding risk.   Procedural risks for the Watchman implant have been reviewed with the patient including a 0.5% risk of stroke, <1% risk of perforation and <1% risk of device embolization.    The published clinical data on the safety and effectiveness of WATCHMAN include but are not limited to the following: - Holmes DR, Mechele Claude, Sick P et al. for  the PROTECT AF Investigators. Percutaneous closure of the left atrial appendage versus warfarin therapy for prevention of stroke in patients with atrial fibrillation: a randomised non-inferiority trial. Lancet 2009; 374: 534-42. Mechele Claude, Doshi SK, Abelardo Diesel D et al. on behalf of the PROTECT AF Investigators. Percutaneous Left Atrial Appendage Closure for Stroke Prophylaxis in Patients With Atrial Fibrillation 2.3-Year Follow-up of the PROTECT AF (Watchman Left Atrial Appendage System for Embolic Protection  in Patients With Atrial Fibrillation) Trial. Circulation 2013; 127:720-729. - Alli O, Doshi S,  Kar S, Reddy VY, Sievert H et al. Quality of Life Assessment in the Randomized PROTECT AF (Percutaneous Closure of the Left Atrial Appendage Versus Warfarin Therapy for Prevention of Stroke in Patients With Atrial Fibrillation) Trial of Patients at Risk for Stroke With Nonvalvular Atrial Fibrillation. J Am Coll Cardiol 2013; 29:5284-1. Vertell Limber DR, Tarri Abernethy, Price M, [place], Sievert H, Doshi S, Huber K, Reddy V. Prospective randomized evaluation of the Watchman left atrial appendage Device in patients with atrial fibrillation versus long-term warfarin therapy; the PREVAIL trial. Journal of the [employer] of Cardiology, Vol. 4, No. 1, 2014, 1-11. - Kar S, Doshi SK, Sadhu A, Horton R, Osorio J et al. Primary outcome evaluation of a next-generation left atrial appendage closure device: results from the PINNACLE FLX trial. Circulation 2021;143(18)1754-1762.    After today's visit with the patient which was dedicated solely for shared decision making visit regarding LAA closure device, the patient decided to proceed with the LAA appendage closure procedure scheduled to be done in the near future at [facility]. Prior to the procedure, I would like to obtain a gated CT scan of the chest with contrast timed for PV/LA visualization.    HAS-BLED score 2 Hypertension Yes  Abnormal renal and  liver function (Dialysis, transplant, Cr >2.26 mg/dL /Cirrhosis or Bilirubin >2x Normal or AST/ALT/AP >3x Normal) No  Stroke No  Bleeding No  Labile INR (Unstable/high INR) No  Elderly (>65) Yes  Drugs or alcohol (? 8 drinks/week, anti-plt or NSAID) No   CHA2DS2-VASc Score = 4  The patient's score is based upon: CHF History: 1 HTN History: 0 Diabetes History: 0 Stroke History: 0 Vascular Disease History: 1 Age Score: 2 Gender Score: 0    Total time spent with patient today 60 minutes. This includes reviewing records, evaluating the patient and coordinating care.  Medication Adjustments/Labs and Tests Ordered: Current medicines are reviewed at length with the patient today.  Concerns regarding medicines are outlined above.  No orders of the defined types were placed in this encounter.  No orders of the defined types were placed in this encounter.    Signed, Hilton Cork. [name], MD, [facility], [facility] 09/21/2021 8:51 PM    Electrophysiology [facility]

## 2021-09-26 ENCOUNTER — Other Ambulatory Visit: Payer: Self-pay | Admitting: Cardiovascular Disease

## 2021-09-26 DIAGNOSIS — I50812 Chronic right heart failure: Secondary | ICD-10-CM

## 2021-09-26 DIAGNOSIS — I5032 Chronic diastolic (congestive) heart failure: Secondary | ICD-10-CM | POA: Diagnosis not present

## 2021-09-26 MED ORDER — SODIUM CHLORIDE 0.9% FLUSH: 3.0000 mL | Freq: Two times a day (BID) | INTRAVENOUS | Status: DC

## 2021-09-26 NOTE — Telephone Encounter (Signed)
Called patient and set him up for RHC with Dr. Haroldine Laws on 6/14 at 10:00 am. Sent message to pre-cert and sent instructions to patient through [place]. Will forward to providers so they are aware.

## 2021-09-27 ENCOUNTER — Telehealth: Payer: Self-pay | Admitting: *Deleted

## 2021-09-27 LAB — CBC
Hematocrit: 40.4 % (ref 37.5–51.0)
Hemoglobin: 13.3 g/dL (ref 13.0–17.7)
MCH: 32.3 pg (ref 26.6–33.0)
MCHC: 32.9 g/dL (ref 31.5–35.7)
MCV: 98 fL — ABNORMAL HIGH (ref 79–97)
Platelets: 156 10*3/uL (ref 150–450)
RBC: 4.12 x10E6/uL — ABNORMAL LOW (ref 4.14–5.80)
RDW: 12.5 % (ref 11.6–15.4)
WBC: 6 10*3/uL (ref 3.4–10.8)

## 2021-09-27 LAB — BASIC METABOLIC PANEL
BUN/Creatinine Ratio: 17 (ref 10–24)
BUN: 44 mg/dL — ABNORMAL HIGH (ref 8–27)
CO2: 26 mmol/L (ref 20–29)
Calcium: 9.2 mg/dL (ref 8.6–10.2)
Chloride: 103 mmol/L (ref 96–106)
Creatinine, Ser: 2.66 mg/dL — ABNORMAL HIGH (ref 0.76–1.27)
Glucose: 83 mg/dL (ref 70–99)
Potassium: 4.7 mmol/L (ref 3.5–5.2)
Sodium: 145 mmol/L — ABNORMAL HIGH (ref 134–144)
eGFR: 24 mL/min/{1.73_m2} — ABNORMAL LOW (ref >59)

## 2021-09-27 NOTE — Telephone Encounter (Signed)
Patient already took his torsemide today by the time I called him. Will send message to Dr. Johnsie Cancel and Dr. Haroldine Laws so they are aware.

## 2021-09-27 NOTE — Telephone Encounter (Signed)
Cardiac Catheterization scheduled at [facility] for: Wednesday September 28, 2021 10 AM Arrival time and place: [facility] Main Entrance A at: 8 AM   Nothing to eat after midnight prior to procedure, clear liquids until 5 AM day of procedure  Medication instructions: -Hold:  Pradaxa-pt reports last dose AM 09/26/21-will hold until post procedure  Torsemide-AM of procedure -Except hold medications usual morning medications can be taken with sips of water.   Confirmed patient has responsible adult to drive home post procedure and be with patient first 24 hours after arriving home.  Patient reports no new symptoms concerning for COVID-19.  Reviewed procedure instructions with patient.

## 2021-09-28 ENCOUNTER — Ambulatory Visit (HOSPITAL_COMMUNITY)
Admission: RE | Admit: 2021-09-28 | Discharge: 2021-09-28 | Disposition: A | Discharge status: Home / Self Care | Payer: Medicare Other | Attending: Internal Medicine | Admitting: Internal Medicine

## 2021-09-28 ENCOUNTER — Other Ambulatory Visit: Payer: Self-pay

## 2021-09-28 ENCOUNTER — Encounter (HOSPITAL_COMMUNITY)
Admission: RE | Disposition: A | Discharge status: Home / Self Care | Payer: Self-pay | Source: Home / Self Care | Attending: Internal Medicine

## 2021-09-28 DIAGNOSIS — E785 Hyperlipidemia, unspecified: Secondary | ICD-10-CM | POA: Diagnosis not present

## 2021-09-28 DIAGNOSIS — N183 Chronic kidney disease, stage 3 unspecified: Secondary | ICD-10-CM | POA: Insufficient documentation

## 2021-09-28 DIAGNOSIS — Z87891 Personal history of nicotine dependence: Secondary | ICD-10-CM | POA: Insufficient documentation

## 2021-09-28 DIAGNOSIS — I2510 Atherosclerotic heart disease of native coronary artery without angina pectoris: Secondary | ICD-10-CM | POA: Insufficient documentation

## 2021-09-28 DIAGNOSIS — I872 Venous insufficiency (chronic) (peripheral): Secondary | ICD-10-CM | POA: Insufficient documentation

## 2021-09-28 DIAGNOSIS — I5032 Chronic diastolic (congestive) heart failure: Secondary | ICD-10-CM | POA: Diagnosis not present

## 2021-09-28 DIAGNOSIS — G629 Polyneuropathy, unspecified: Secondary | ICD-10-CM | POA: Insufficient documentation

## 2021-09-28 DIAGNOSIS — I272 Pulmonary hypertension, unspecified: Secondary | ICD-10-CM | POA: Insufficient documentation

## 2021-09-28 DIAGNOSIS — Z955 Presence of coronary angioplasty implant and graft: Secondary | ICD-10-CM | POA: Diagnosis not present

## 2021-09-28 DIAGNOSIS — I50812 Chronic right heart failure: Secondary | ICD-10-CM

## 2021-09-28 DIAGNOSIS — I130 Hypertensive heart and chronic kidney disease with heart failure and stage 1 through stage 4 chronic kidney disease, or unspecified chronic kidney disease: Secondary | ICD-10-CM | POA: Insufficient documentation

## 2021-09-28 DIAGNOSIS — I252 Old myocardial infarction: Secondary | ICD-10-CM | POA: Diagnosis not present

## 2021-09-28 DIAGNOSIS — Z86718 Personal history of other venous thrombosis and embolism: Secondary | ICD-10-CM | POA: Diagnosis not present

## 2021-09-28 DIAGNOSIS — Z7982 Long term (current) use of aspirin: Secondary | ICD-10-CM | POA: Insufficient documentation

## 2021-09-28 HISTORY — PX: RIGHT HEART CATH: CATH118263

## 2021-09-28 LAB — POCT I-STAT EG7
Acid-Base Excess: 2 mmol/L (ref 0.0–2.0)
Bicarbonate: 27.3 mmol/L (ref 20.0–28.0)
Calcium, Ion: 1.21 mmol/L (ref 1.15–1.40)
HCT: 38 % — ABNORMAL LOW (ref 39.0–52.0)
Hemoglobin: 12.9 g/dL — ABNORMAL LOW (ref 13.0–17.0)
O2 Saturation: 59 %
Potassium: 4.4 mmol/L (ref 3.5–5.1)
Sodium: 142 mmol/L (ref 135–145)
TCO2: 29 mmol/L (ref 22–32)
pCO2, Ven: 45.4 mmHg (ref 44–60)
pH, Ven: 7.387 (ref 7.25–7.43)
pO2, Ven: 31 mmHg — CL (ref 32–45)

## 2021-09-28 SURGERY — RIGHT HEART CATH
Anesthesia: LOCAL

## 2021-09-28 MED ORDER — LIDOCAINE HCL (PF) 1 % IJ SOLN
INTRAMUSCULAR | Status: DC | PRN
  Administered 2021-09-28: 2 mL

## 2021-09-28 MED ORDER — SODIUM CHLORIDE 0.9% FLUSH: 3.0000 mL | Freq: Two times a day (BID) | INTRAVENOUS | Status: DC

## 2021-09-28 MED ORDER — HYDRALAZINE HCL 20 MG/ML IJ SOLN: 10.0000 mg | INTRAMUSCULAR | Status: DC | PRN

## 2021-09-28 MED ORDER — HEPARIN (PORCINE) IN NACL 1000-0.9 UT/500ML-% IV SOLN
INTRAVENOUS | Status: AC
  Filled 2021-09-28: qty 500

## 2021-09-28 MED ORDER — SODIUM CHLORIDE 0.9 % IV SOLN: 250.0000 mL | INTRAVENOUS | Status: DC | PRN

## 2021-09-28 MED ORDER — SODIUM CHLORIDE 0.9% FLUSH: 3.0000 mL | INTRAVENOUS | Status: DC | PRN

## 2021-09-28 MED ORDER — LIDOCAINE HCL (PF) 1 % IJ SOLN
INTRAMUSCULAR | Status: AC
  Filled 2021-09-28: qty 30

## 2021-09-28 MED ORDER — ACETAMINOPHEN 325 MG PO TABS: 650.0000 mg | ORAL_TABLET | ORAL | Status: DC | PRN

## 2021-09-28 MED ORDER — SODIUM CHLORIDE 0.9 % IV SOLN: INTRAVENOUS | Status: DC

## 2021-09-28 MED ORDER — ASPIRIN 81 MG PO CHEW
81.0000 mg | CHEWABLE_TABLET | ORAL | Status: AC
  Administered 2021-09-28: 81 mg via ORAL
  Filled 2021-09-28: qty 1

## 2021-09-28 MED ORDER — ONDANSETRON HCL 4 MG/2ML IJ SOLN: 4.0000 mg | Freq: Four times a day (QID) | INTRAMUSCULAR | Status: DC | PRN

## 2021-09-28 MED ORDER — LABETALOL HCL 5 MG/ML IV SOLN: 10.0000 mg | INTRAVENOUS | Status: DC | PRN

## 2021-09-28 MED ORDER — HEPARIN (PORCINE) IN NACL 1000-0.9 UT/500ML-% IV SOLN
INTRAVENOUS | Status: DC | PRN
  Administered 2021-09-28: 500 mL

## 2021-09-28 SURGICAL SUPPLY — 9 items
CATH BALLN WEDGE 5F 110CM (CATHETERS) ×1 IMPLANT
GUIDEWIRE .025 260CM (WIRE) ×1 IMPLANT
PACK CARDIAC CATHETERIZATION (CUSTOM PROCEDURE TRAY) ×2 IMPLANT
PROTECTION STATION PRESSURIZED (MISCELLANEOUS) ×2
SHEATH GLIDE SLENDER 4/5FR (SHEATH) ×1 IMPLANT
STATION PROTECTION PRESSURIZED (MISCELLANEOUS) IMPLANT
TRANSDUCER W/STOPCOCK (MISCELLANEOUS) ×2 IMPLANT
TUBING ART PRESS 72  MALE/FEM (TUBING) ×1
TUBING ART PRESS 72 MALE/FEM (TUBING) IMPLANT

## 2021-09-28 NOTE — Interval H&P Note (Signed)
History and Physical Interval Note:  09/28/2021 2:49 PM  Gregory Guerrero  has presented today for surgery, with the diagnosis of heart failure.  The various methods of treatment have been discussed with the patient and family. After consideration of risks, benefits and other options for treatment, the patient has consented to  Procedure(s): RIGHT HEART CATH (N/A) as a surgical intervention.  The patient's history has been reviewed, patient examined, no change in status, stable for surgery.  I have reviewed the patient's chart and labs.  Questions were answered to the patient's satisfaction.     [name]

## 2021-09-29 ENCOUNTER — Encounter (HOSPITAL_COMMUNITY): Payer: Self-pay | Admitting: Internal Medicine

## 2021-09-29 ENCOUNTER — Telehealth: Payer: Self-pay

## 2021-09-29 DIAGNOSIS — L03116 Cellulitis of left lower limb: Secondary | ICD-10-CM

## 2021-09-29 DIAGNOSIS — I340 Nonrheumatic mitral (valve) insufficiency: Secondary | ICD-10-CM

## 2021-09-29 DIAGNOSIS — R600 Localized edema: Secondary | ICD-10-CM

## 2021-09-29 LAB — POCT I-STAT EG7
Acid-base deficit: 2 mmol/L (ref 0.0–2.0)
Bicarbonate: 23.3 mmol/L (ref 20.0–28.0)
Calcium, Ion: 0.9 mmol/L — ABNORMAL LOW (ref 1.15–1.40)
HCT: 32 % — ABNORMAL LOW (ref 39.0–52.0)
Hemoglobin: 10.9 g/dL — ABNORMAL LOW (ref 13.0–17.0)
O2 Saturation: 62 %
Potassium: 3.4 mmol/L — ABNORMAL LOW (ref 3.5–5.1)
Sodium: 148 mmol/L — ABNORMAL HIGH (ref 135–145)
TCO2: 24 mmol/L (ref 22–32)
pCO2, Ven: 39.7 mmHg — ABNORMAL LOW (ref 44–60)
pH, Ven: 7.376 (ref 7.25–7.43)
pO2, Ven: 33 mmHg (ref 32–45)

## 2021-09-29 MED ORDER — CEPHALEXIN 500 MG PO CAPS: 500.0000 mg | ORAL_CAPSULE | Freq: Two times a day (BID) | ORAL | 0 refills | Status: DC

## 2021-09-29 NOTE — Telephone Encounter (Signed)
Per Dr. Johnsie Cancel, patient needs limited echo for amyloid Strain imaging, and also order Tc Pyrophosphate scan to r/o amyloid. Placed orders for lab work, test, and medication. Called patient's wife (DPR) to make her aware of all the test.

## 2021-09-29 NOTE — Telephone Encounter (Signed)
-----   Message from [name], MD sent at 09/28/2021  3:39 PM EDT ----- Kenney Houseman also needs SPEP, UPEP 24 hour urine protein Can call in Keflex 500 mg bid for 5 days no refills

## 2021-09-30 ENCOUNTER — Ambulatory Visit (HOSPITAL_COMMUNITY): Payer: Medicare Other | Attending: Cardiovascular Disease

## 2021-09-30 ENCOUNTER — Other Ambulatory Visit: Payer: Self-pay | Admitting: *Deleted

## 2021-09-30 DIAGNOSIS — R600 Localized edema: Secondary | ICD-10-CM

## 2021-09-30 DIAGNOSIS — L03116 Cellulitis of left lower limb: Secondary | ICD-10-CM

## 2021-09-30 DIAGNOSIS — I5032 Chronic diastolic (congestive) heart failure: Secondary | ICD-10-CM | POA: Diagnosis not present

## 2021-09-30 DIAGNOSIS — Z79899 Other long term (current) drug therapy: Secondary | ICD-10-CM | POA: Diagnosis not present

## 2021-09-30 DIAGNOSIS — R7989 Other specified abnormal findings of blood chemistry: Secondary | ICD-10-CM

## 2021-09-30 DIAGNOSIS — I340 Nonrheumatic mitral (valve) insufficiency: Secondary | ICD-10-CM

## 2021-09-30 LAB — ECHOCARDIOGRAM LIMITED
Area-P 1/2: 3.42 cm2
MV M vel: 4.63 m/s
MV Peak grad: 85.9 mmHg
S' Lateral: 3.5 cm

## 2021-10-01 LAB — PRO B NATRIURETIC PEPTIDE: NT-Pro BNP: 7936 pg/mL — ABNORMAL HIGH (ref 0–486)

## 2021-10-02 DIAGNOSIS — R600 Localized edema: Secondary | ICD-10-CM | POA: Diagnosis not present

## 2021-10-02 DIAGNOSIS — I340 Nonrheumatic mitral (valve) insufficiency: Secondary | ICD-10-CM | POA: Diagnosis not present

## 2021-10-02 DIAGNOSIS — L03116 Cellulitis of left lower limb: Secondary | ICD-10-CM | POA: Diagnosis not present

## 2021-10-04 ENCOUNTER — Encounter: Payer: Self-pay | Admitting: Internal Medicine

## 2021-10-04 ENCOUNTER — Ambulatory Visit (INDEPENDENT_AMBULATORY_CARE_PROVIDER_SITE_OTHER): Payer: Medicare Other | Admitting: Internal Medicine

## 2021-10-04 VITALS — BP 120/68 | HR 65 | Temp 98.0°F | Resp 18 | Ht 66.0 in | Wt 198.4 lb

## 2021-10-04 DIAGNOSIS — N189 Chronic kidney disease, unspecified: Secondary | ICD-10-CM

## 2021-10-04 DIAGNOSIS — I2510 Atherosclerotic heart disease of native coronary artery without angina pectoris: Secondary | ICD-10-CM | POA: Diagnosis not present

## 2021-10-04 DIAGNOSIS — E785 Hyperlipidemia, unspecified: Secondary | ICD-10-CM | POA: Diagnosis not present

## 2021-10-04 DIAGNOSIS — N184 Chronic kidney disease, stage 4 (severe): Secondary | ICD-10-CM | POA: Diagnosis not present

## 2021-10-04 DIAGNOSIS — Z0000 Encounter for general adult medical examination without abnormal findings: Secondary | ICD-10-CM | POA: Insufficient documentation

## 2021-10-04 DIAGNOSIS — I10 Essential (primary) hypertension: Secondary | ICD-10-CM

## 2021-10-04 DIAGNOSIS — D649 Anemia, unspecified: Secondary | ICD-10-CM | POA: Diagnosis not present

## 2021-10-04 DIAGNOSIS — I4819 Other persistent atrial fibrillation: Secondary | ICD-10-CM

## 2021-10-04 LAB — PROTEIN ELECTROPHORESIS, SERUM
A/G Ratio: 1.4 (ref 0.7–1.7)
Albumin ELP: 3.4 g/dL (ref 2.9–4.4)
Alpha 1: 0.2 g/dL (ref 0.0–0.4)
Alpha 2: 0.5 g/dL (ref 0.4–1.0)
Beta: 0.8 g/dL (ref 0.7–1.3)
Gamma Globulin: 0.9 g/dL (ref 0.4–1.8)
Globulin, Total: 2.4 g/dL (ref 2.2–3.9)
Total Protein: 5.8 g/dL — ABNORMAL LOW (ref 6.0–8.5)

## 2021-10-04 MED ORDER — TETANUS-DIPHTH-ACELL PERTUSSIS 5-2.5-18.5 LF-MCG/0.5 IM SUSP
0.5000 mL | Freq: Once | INTRAMUSCULAR | 0 refills | Status: AC
Start: 2021-10-04 — End: 2021-10-04

## 2021-10-04 MED ORDER — SHINGRIX 50 MCG/0.5ML IM SUSR: 0.5000 mL | Freq: Once | INTRAMUSCULAR | 1 refills | Status: AC

## 2021-10-04 NOTE — Patient Instructions (Addendum)
Recommend to proceed with the following vaccines at your pharmacy: Shingrix (shingles) Tdap (tetanus) Covid booster (bivalent)  Please read the information about a healthcare power of attorney   GO TO THE LAB : Get the blood work     Gregory Guerrero, [place] back for a checkup in 4 months

## 2021-10-04 NOTE — Progress Notes (Unsigned)
Subjective:    Patient ID: Gregory Guerrero, male    DOB: 02/18/1946, 76 y.o.   MRN: [id number]  DOS:  10/04/2021 Type of visit - description: Follow-up  Since the last office visit, saw cardiology.  Notes reviewed. Neuropathy: We review the work-up, it was essentially negative.  Never tried Lyrica Kidney function has decreased, cardiology just ordered a 24-hour urine collection. Lower extremity edema is about the same Mild anemia noted, denies any GI symptoms. Did report urinary urgency and nocturia without gross hematuria or dysuria.  Next Review of Systems See above   Past Medical History:  Diagnosis Date   Arthritis    CAD (coronary artery disease)    Chronic kidney disease    DVT (deep venous thrombosis) (HCC)    following spinal surgery Nov 2017   HTN (hypertension)    MI (myocardial infarction) ([place])    1999 ; stents placed    Numbness and tingling in both hands    Spondylosis of cervical spine     Past Surgical History:  Procedure Laterality Date   ANTERIOR CERVICAL DECOMP/DISCECTOMY FUSION N/A 06/27/2017   Procedure: ANTERIOR CERVICAL DECOMPRESSION/DISCECTOMY FUSION C3-4;  Surgeon: [name], MD;  Location: [place];  Service: Orthopedics;  Laterality: N/A;  3 hrs   ANTERIOR CERVICAL DECOMP/DISCECTOMY FUSION  01/24/2019   ANTERIOR LAT LUMBAR FUSION N/A 03/01/2016   Procedure: EXTREME LATERAL INTERBODY FUSION  LUMBAR 2-4;  Surgeon: [name], MD;  Location: [place];  Service: Orthopedics;  Laterality: N/A;   CARDIAC CATHETERIZATION     20 yrs ago   CARDIOVERSION N/A 07/27/2020   Procedure: CARDIOVERSION;  Surgeon: [name], MD;  Location: [place];  Service: Cardiovascular;  Laterality: N/A;   CARDIOVERSION N/A 08/05/2021   Procedure: CARDIOVERSION;  Surgeon: [name], MD;  Location: [place];  Service: Cardiovascular;  Laterality: N/A;   JOINT REPLACEMENT     NECK HARDWARE REMOVAL  12/11/2018   RADIOLOGY WITH ANESTHESIA N/A  05/15/2017   Procedure: MRI WITH ANESTHESIA CERVICAL SPINE WITH AND WITHOUT;  Surgeon: Radiologist, Medication, MD;  Location: [place];  Service: Radiology;  Laterality: N/A;   RIGHT HEART CATH N/A 09/28/2021   Procedure: RIGHT HEART CATH;  Surgeon: Jolaine Artist, MD;  Location: [place] CV LAB;  Service: Cardiovascular;  Laterality: N/A;   SPINAL FUSION N/A 03/02/2016   Procedure: POSTERIOR SPINAL FUSION INTERBODY L2-S1, DECOMPRESSION L4-S1;  Surgeon: [name], MD;  Location: [place];  Service: Orthopedics;  Laterality: N/A;   TOTAL KNEE ARTHROPLASTY Right 04/30/2017   Procedure: RIGHT TOTAL KNEE ARTHROPLASTY;  Surgeon: [name], MD;  [facility];  Service: Orthopedics;  Laterality: Right;    Current Outpatient Medications  Medication Instructions   acetaminophen (TYLENOL) 650 mg, Oral, Every 8 hours PRN   amiodarone (PACERONE) 200 mg, Oral, Daily   aspirin EC 81 mg, Oral, Daily at bedtime   atorvastatin (LIPITOR) 80 MG tablet TAKE 1 TABLET('80MG'$ ) BY MOUTH EVERY OTHER DAY AND TAKE 1/2 TABLET BY MOUTH ON ALTERNATE DAYS   Cannabidiol POWD 1 Application, Topical, 3 times daily PRN, CBD cream   cephALEXin (KEFLEX) 500 mg, Oral, 2 times daily, For 5 days   dabigatran (PRADAXA) 150 mg, Oral, 2 times daily   metoprolol tartrate (LOPRESSOR) 25 mg, Oral, 2 times daily   Multiple Vitamin (MULTIVITAMIN WITH MINERALS) TABS tablet 1 tablet, Oral, Every morning   pregabalin (LYRICA) 75 mg, Oral, 2 times daily   torsemide (DEMADEX) 20 mg, Oral, 2 times  daily   vitamin C 1,000 mg, Oral, Daily       Objective:   Physical Exam BP 120/68   Pulse 65   Temp 98 F (36.7 C) (Oral)   Resp 18   Ht '5\' 6"'$  (1.676 m)   Wt 198 lb 6 oz (90 kg)   SpO2 95%   BMI 32.02 kg/m  General:   Well developed, NAD, BMI noted. HEENT:  Normocephalic . Face symmetric, atraumatic Lungs:  CTA B Normal respiratory effort, no intercostal retractions, no accessory muscle use. Heart: Seems regular  today Lower extremities: no pretibial edema on the R. +/+++ At the L ankle and distal pretibial area Skin: Not pale. Not jaundice Neurologic:  alert & oriented X3.  Speech normal, gait appropriate for age and unassisted Psych--  Cognition and judgment appear intact.  Cooperative with normal attention span and concentration.  Behavior appropriate. No anxious or depressed appearing.      Assessment     Assessment--- new patient 01/24/2017 HTN, dx ~ 2010 Hyperlipidemia CKD CV: -CAD, history of remote MI; EET 02-23-17 w/ no ischemia - Diastolic CHF -H/o L  DVT ~ 02/2016 after back surgery - Chronic venous dz - Paroxysmal A-fib Dx 05-2020 during routine, visit BCC, L face, last derm note 2014 Lower extremity ulcer, right leg after an injury H/o elevated PSA per urology note 2014, DRE normal, PSA was 3.7.  Cervical myelopathy: Surgery 06/27/2017: Anterior cervical decompression and discectomy with fusion of C3-C4   PLAN Neuropathy: See last visit.  Blood work was essentially negative. Was Rx Lyrica never tried "I did not know you sent it".  I offered him Lyrica again, he states that the issue seems to have calm down and reports that is not necessary.  CAD, persistent A-fib, lower extremity edema:  Since the last visit, saw primary cardiology, switched from Lasix to [facility], was Rx compression stockings. Had R heart cath 09/2021. Saw electrophysiologist.  They are considering a EP study and radiofrequency ablation Currently asymptomatic, tolerating anticoagulation and aspirin well.  See next Anemia: Very mild anemia noted, no overt GI symptoms, anticoagulated.  Check iron panel. High cholesterol: On Lipitor alternates 80 mg and 40 mg qod.  Check labs CKD: Creatinine has increased, I noticed cardiology ordered 24-hour urine collection recheck today.  Consider renal ultrasound. LFTs: Last AST was mildly elevated at 45. LUTS: Reports urinary frequency and occasional nocturia without gross  hematuria.  Work-up would include DRE, UA, PSA.  Declined it.  Encouraged to call me if he changes his mind Preventive care reviewed. RTC 4 months Next Tdap: Rx printed today Shingrix: Prescription printed PNM 23: 2019 PNM 20: declines  Prostate cancer screening: Aged out.  He agreed CCS: Never had a colonoscopy, aged out.  He agreed POA informed provided            ====== New patient, presents after more than 3 years.  Request evaluation for neuropathy. Neuropathy: The patient reports he "self diagnosed" neuropathy few months ago.  See symptoms description above. On reviewing the chart, he has a history of cervical myelopathy, had surgery in 2019. Currently  has mild neck and back pain. Neuropathy has been mentioned in the chart multiple times. Symptoms and physical examination are consistent with neuropathy.  He has good peripheral pulses. Gabapentin was not effective per pt  Plan: Labs: LFTs, A1c, TSH, RPR,  C58, folic acid , vit D (HIV not covered) Trial with Lyrica Neurology referral CAD, paroxysmal A-fib, lower extremity edema: Managed  by cardiology, Saw cardiology few days ago, the started amiodarone, changed Lasix to [facility] and planning DCCV. Not taking losartan (has not been refilled in years) Tinea pedis: See physical exam, also have onychomycosis.  Rx topical nizoral although he may need oral medications at some point. RTC 2 months

## 2021-10-05 DIAGNOSIS — N184 Chronic kidney disease, stage 4 (severe): Secondary | ICD-10-CM | POA: Insufficient documentation

## 2021-10-05 LAB — UPEP/TP, 24-HR URINE
Albumin, U: 100 %
Alpha 1, Urine: 0 %
Alpha 2, Urine: 0 %
Beta, Urine: 0 %
Gamma Globulin, Urine: 0 %
Protein, 24H Urine: 158 mg/24 hr — ABNORMAL HIGH (ref 30–150)
Protein, Ur: 8.3 mg/dL

## 2021-10-05 LAB — LIPID PANEL
Cholesterol: 122 mg/dL (ref 0–200)
HDL: 36.9 mg/dL — ABNORMAL LOW (ref >39.00)
LDL Cholesterol: 65 mg/dL (ref 0–99)
NonHDL: 84.7
Total CHOL/HDL Ratio: 3
Triglycerides: 100 mg/dL (ref 0.0–149.0)
VLDL: 20 mg/dL (ref 0.0–40.0)

## 2021-10-05 LAB — BASIC METABOLIC PANEL
BUN: 44 mg/dL — ABNORMAL HIGH (ref 6–23)
CO2: 29 mEq/L (ref 19–32)
Calcium: 9.1 mg/dL (ref 8.4–10.5)
Chloride: 104 mEq/L (ref 96–112)
Creatinine, Ser: 2.42 mg/dL — ABNORMAL HIGH (ref 0.40–1.50)
GFR: 25.11 mL/min — ABNORMAL LOW (ref >60.00)
Glucose, Bld: 81 mg/dL (ref 70–99)
Potassium: 4.5 mEq/L (ref 3.5–5.1)
Sodium: 141 mEq/L (ref 135–145)

## 2021-10-05 LAB — IRON: Iron: 97 ug/dL (ref 42–165)

## 2021-10-05 LAB — FERRITIN: Ferritin: 72.8 ng/mL (ref 22.0–322.0)

## 2021-10-05 NOTE — Assessment & Plan Note (Signed)
Preventive care reviewed Tdap: Rx printed today Shingrix: Prescription printed PNM 23: 2019 PNM 20: declines  Prostate cancer screening: Aged out.  He agreed CCS: Never had a colonoscopy, aged out.  He agreed POA informed provided

## 2021-10-05 NOTE — Assessment & Plan Note (Signed)
Neuropathy: See last visit.  Blood work was essentially negative. Was Rx Lyrica never tried "I did not know you sent it".  I offered him Lyrica again, he states that the issue seems to have calm down and reports that is not necessary.  CAD, persistent A-fib, lower extremity edema:  Since the last visit, saw primary cardiology, switched from Lasix to [facility], was Rx compression stockings. Had R heart cath 09/2021. Saw electrophysiologist.  They are considering a EP study and radiofrequency ablation Currently asymptomatic, tolerating anticoagulation and aspirin well.  See next Anemia: Very mild anemia noted, no overt GI symptoms, anticoagulated.  Check iron panel. High cholesterol: On Lipitor alternates 80 mg and 40 mg qod.  Check labs CKD: Creatinine has increased, I noticed cardiology ordered 24-hour urine collection recheck today.  Consider renal ultrasound. LFTs: Last AST was mildly elevated at 45. LUTS: Reports urinary frequency and occasional nocturia without gross hematuria.  Work-up would include DRE, UA, PSA.  Declined further evaluation but encouraged to call me if he changes his mind Preventive care reviewed. RTC 4 months

## 2021-10-10 ENCOUNTER — Other Ambulatory Visit: Payer: Self-pay

## 2021-10-10 DIAGNOSIS — E859 Amyloidosis, unspecified: Secondary | ICD-10-CM

## 2021-10-11 ENCOUNTER — Encounter: Payer: Self-pay | Admitting: *Deleted

## 2021-10-11 ENCOUNTER — Telehealth (HOSPITAL_COMMUNITY): Payer: Self-pay | Admitting: *Deleted

## 2021-10-12 ENCOUNTER — Ambulatory Visit (HOSPITAL_BASED_OUTPATIENT_CLINIC_OR_DEPARTMENT_OTHER): Payer: Medicare Other

## 2021-10-12 ENCOUNTER — Ambulatory Visit (HOSPITAL_COMMUNITY)
Admission: RE | Admit: 2021-10-12 | Discharge: 2021-10-12 | Disposition: A | Discharge status: Home / Self Care | Payer: Medicare Other | Source: Ambulatory Visit | Attending: Cardiology | Admitting: Cardiology

## 2021-10-12 DIAGNOSIS — I340 Nonrheumatic mitral (valve) insufficiency: Secondary | ICD-10-CM | POA: Diagnosis not present

## 2021-10-12 DIAGNOSIS — L03116 Cellulitis of left lower limb: Secondary | ICD-10-CM

## 2021-10-12 DIAGNOSIS — R600 Localized edema: Secondary | ICD-10-CM | POA: Diagnosis not present

## 2021-10-12 MED ORDER — TECHNETIUM TC 99M PYROPHOSPHATE
21.8000 | Freq: Once | INTRAVENOUS | Status: AC
  Administered 2021-10-12: 21.8 via INTRAVENOUS

## 2021-10-13 ENCOUNTER — Ambulatory Visit (HOSPITAL_BASED_OUTPATIENT_CLINIC_OR_DEPARTMENT_OTHER)
Admission: RE | Admit: 2021-10-13 | Discharge: 2021-10-13 | Disposition: A | Discharge status: Home / Self Care | Payer: Medicare Other | Source: Ambulatory Visit | Attending: Internal Medicine | Admitting: Internal Medicine

## 2021-10-13 ENCOUNTER — Telehealth: Payer: Self-pay

## 2021-10-13 DIAGNOSIS — N189 Chronic kidney disease, unspecified: Secondary | ICD-10-CM | POA: Insufficient documentation

## 2021-10-13 DIAGNOSIS — E859 Amyloidosis, unspecified: Secondary | ICD-10-CM

## 2021-10-13 NOTE — Telephone Encounter (Signed)
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

## 2021-10-13 NOTE — Telephone Encounter (Signed)
Called patient to let him know an order has been placed for MRI.

## 2021-10-14 ENCOUNTER — Telehealth: Payer: Self-pay | Admitting: Internal Medicine

## 2021-10-14 ENCOUNTER — Encounter: Payer: Self-pay | Admitting: *Deleted

## 2021-10-14 NOTE — Telephone Encounter (Signed)
Spoke with the patient in regards the referral to hematology that he declined. 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 I explained the best I could this condition, he still feels like he is getting "conflicting information" about his problems. Nevertheless he is inclined to see hematology. Will forward this note to the oncology navigator and recommend that he is reach out again.

## 2021-10-14 NOTE — Progress Notes (Signed)
Patient spoke with his PCP and now is agreeable to making appointment.  Reached out to Ines Bloomer to introduce myself as the office RN Navigator and explain our new patient process. Reviewed the reason for their referral and scheduled their new patient appointment along with labs. Provided address and directions to the office including call back phone number. Reviewed with patient any concerns they may have or any possible barriers to attending their appointment.   Informed patient about my role as a navigator and that I will meet with them prior to their New Patient appointment and more fully discuss what services I can provide. At this time patient has no further questions or needs.    Oncology Nurse Navigator Documentation     10/14/2021    4:00 PM  Oncology Nurse Navigator Flowsheets  Navigator Follow Up Date: 10/19/2021  Navigator Follow Up Reason: New Patient Appointment  Navigator Location [facility]  Navigator Encounter Type Introductory Phone Call  Patient Visit Type MedOnc  Treatment Phase Abnormal Scans;Abnormal Labs  Barriers/Navigation Needs Coordination of Care;Education  Education Other  Interventions Coordination of Care;Education  Acuity Level 2-Minimal Needs (1-2 Barriers Identified)  Coordination of Care Appts  Education Method Verbal;Teach-back  Support Groups/Services Friends and Family  Time Spent with Patient 30

## 2021-10-17 ENCOUNTER — Telehealth (HOSPITAL_COMMUNITY): Payer: Self-pay | Admitting: *Deleted

## 2021-10-17 NOTE — Telephone Encounter (Signed)
Reaching out to patient to offer assistance regarding upcoming cardiac imaging study; pt verbalizes understanding of appt date/time, parking situation and where to check in, and verified current allergies; name and call back number provided for further questions should they arise  Gordy Clement RN Navigator Cardiac Imaging [facility] [phone] office [phone] cell  Patient reports pins in back and knee replacement. 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 Kyla Balzarine office.

## 2021-10-19 ENCOUNTER — Encounter: Payer: Self-pay | Admitting: Hematology & Oncology

## 2021-10-19 ENCOUNTER — Inpatient Hospital Stay (HOSPITAL_BASED_OUTPATIENT_CLINIC_OR_DEPARTMENT_OTHER): Payer: Medicare Other | Admitting: Hematology & Oncology

## 2021-10-19 ENCOUNTER — Inpatient Hospital Stay: Payer: Medicare Other | Attending: Hematology & Oncology

## 2021-10-19 ENCOUNTER — Encounter (HOSPITAL_COMMUNITY): Payer: Medicare Other

## 2021-10-19 VITALS — BP 105/53 | HR 54 | Temp 98.2°F | Resp 17 | Ht 68.0 in | Wt 191.0 lb

## 2021-10-19 DIAGNOSIS — Z808 Family history of malignant neoplasm of other organs or systems: Secondary | ICD-10-CM | POA: Diagnosis not present

## 2021-10-19 DIAGNOSIS — N289 Disorder of kidney and ureter, unspecified: Secondary | ICD-10-CM

## 2021-10-19 DIAGNOSIS — Z809 Family history of malignant neoplasm, unspecified: Secondary | ICD-10-CM | POA: Diagnosis not present

## 2021-10-19 DIAGNOSIS — R9431 Abnormal electrocardiogram [ECG] [EKG]: Secondary | ICD-10-CM | POA: Insufficient documentation

## 2021-10-19 DIAGNOSIS — M2550 Pain in unspecified joint: Secondary | ICD-10-CM | POA: Insufficient documentation

## 2021-10-19 DIAGNOSIS — R0602 Shortness of breath: Secondary | ICD-10-CM | POA: Insufficient documentation

## 2021-10-19 DIAGNOSIS — R42 Dizziness and giddiness: Secondary | ICD-10-CM | POA: Diagnosis not present

## 2021-10-19 DIAGNOSIS — R350 Frequency of micturition: Secondary | ICD-10-CM | POA: Diagnosis not present

## 2021-10-19 DIAGNOSIS — Z79899 Other long term (current) drug therapy: Secondary | ICD-10-CM | POA: Diagnosis not present

## 2021-10-19 DIAGNOSIS — N184 Chronic kidney disease, stage 4 (severe): Secondary | ICD-10-CM

## 2021-10-19 LAB — CBC WITH DIFFERENTIAL (CANCER CENTER ONLY)
Abs Immature Granulocytes: 0.01 10*3/uL (ref 0.00–0.07)
Basophils Absolute: 0 10*3/uL (ref 0.0–0.1)
Basophils Relative: 0 %
Eosinophils Absolute: 0.1 10*3/uL (ref 0.0–0.5)
Eosinophils Relative: 2 %
HCT: 34 % — ABNORMAL LOW (ref 39.0–52.0)
Hemoglobin: 11.2 g/dL — ABNORMAL LOW (ref 13.0–17.0)
Immature Granulocytes: 0 %
Lymphocytes Relative: 22 %
Lymphs Abs: 1.2 10*3/uL (ref 0.7–4.0)
MCH: 33.1 pg (ref 26.0–34.0)
MCHC: 32.9 g/dL (ref 30.0–36.0)
MCV: 100.6 fL — ABNORMAL HIGH (ref 80.0–100.0)
Monocytes Absolute: 0.8 10*3/uL (ref 0.1–1.0)
Monocytes Relative: 14 %
Neutro Abs: 3.4 10*3/uL (ref 1.7–7.7)
Neutrophils Relative %: 62 %
Platelet Count: 162 10*3/uL (ref 150–400)
RBC: 3.38 MIL/uL — ABNORMAL LOW (ref 4.22–5.81)
RDW: 13.5 % (ref 11.5–15.5)
WBC Count: 5.5 10*3/uL (ref 4.0–10.5)
nRBC: 0 % (ref 0.0–0.2)

## 2021-10-19 LAB — CMP (CANCER CENTER ONLY)
ALT: 14 U/L (ref 0–44)
AST: 24 U/L (ref 15–41)
Albumin: 3.7 g/dL (ref 3.5–5.0)
Alkaline Phosphatase: 75 U/L (ref 38–126)
Anion gap: 8 (ref 5–15)
BUN: 67 mg/dL — ABNORMAL HIGH (ref 8–23)
CO2: 28 mmol/L (ref 22–32)
Calcium: 9.1 mg/dL (ref 8.9–10.3)
Chloride: 104 mmol/L (ref 98–111)
Creatinine: 2.99 mg/dL — ABNORMAL HIGH (ref 0.61–1.24)
GFR, Estimated: 21 mL/min — ABNORMAL LOW (ref >60)
Glucose, Bld: 97 mg/dL (ref 70–99)
Potassium: 4.2 mmol/L (ref 3.5–5.1)
Sodium: 140 mmol/L (ref 135–145)
Total Bilirubin: 1 mg/dL (ref 0.3–1.2)
Total Protein: 6.6 g/dL (ref 6.5–8.1)

## 2021-10-19 LAB — LACTATE DEHYDROGENASE: LDH: 129 U/L (ref 98–192)

## 2021-10-19 NOTE — Progress Notes (Signed)
Referral MD  Reason for Referral: Possible cardiac amyloidosis  No chief complaint on file. : I was sent here by my family doctor.  HPI: Gregory Guerrero is a very nice 76 year old white male.  He comes in with his wife.  He is originally from [place], [place].  He has been down here for several years.  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  He is still doing this down here in [place].  He is followed by cardiology.  Apparently, he has been found to have a very high pro-BNP.  This has been going up.  In May of this year, it was 5913.  In early June it was 2.  In mid June it was almost 8000.  He had an echocardiogram that was done in June.  This apparently showed some changes that were consistent with amyloid.  Left ventricular ejection fraction of 55-60%.  The right ventricle had low function.  There was no thickening of the right ventricle.  There was some moderate concentric thickening of the left ventricle.  He has severely dilated left atrium.  He does have atrial fibrillation.  He is on Pradaxa for this.  He then had a nuclear medicine scan.  This was done on 10/13/2021.  This seemed to be highly suggestive of amyloidosis.  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  It was felt that he needed to be seen by hematology just to make sure that there is no evidence of systemic amyloidosis.  He is very nice.  He is fun to talk to.  He has had multiple health problems.  He has renal insufficiency.  He has not yet seen nephrology.  He had a renal ultrasound which did not did not show any kidney enlargement.  He says he has a weak urine stream.  He goes quite often.  I just wonder if he has prostate issues.  He has had some shoulder issues.  Mostly, his right shoulder has been weak.  He has had no problems swallowing.  He has had no problems talking.  There has been no issues of tobacco use.  He said he stopped many many years ago.  He does not have any alcohol  use.  He did have a urine done on 10/02/2021.  I am not sure if this was a 24-hour urine.  There was no obvious monoclonal protein noted.  Of note, he has never had a colonoscopy.  He does not complain of any diarrhea.  He has had no skin lesions.  His wife says that he is have more dizziness.  I will know if this might be from medications for blood pressure.  She says that he becomes more short of breath with exertion.  Currently, I would say his performance status is probably ECOG 1.    Past Medical History:  Diagnosis Date   Arthritis    CAD (coronary artery disease)    Chronic kidney disease    DVT (deep venous thrombosis) (HCC)    following spinal surgery Nov 2017   HTN (hypertension)    MI (myocardial infarction) ([place])    1999 ; stents placed    Numbness and tingling in both hands    Spondylosis of cervical spine   :   Past Surgical History:  Procedure Laterality Date   ANTERIOR CERVICAL DECOMP/DISCECTOMY FUSION N/A 06/27/2017   Procedure: ANTERIOR CERVICAL DECOMPRESSION/DISCECTOMY FUSION C3-4;  Surgeon: [name], MD;  Location: [facility];  Service:  Orthopedics;  Laterality: N/A;  3 hrs   ANTERIOR CERVICAL DECOMP/DISCECTOMY FUSION  01/24/2019   ANTERIOR LAT LUMBAR FUSION N/A 03/01/2016   Procedure: EXTREME LATERAL INTERBODY FUSION  LUMBAR 2-4;  Surgeon: [name], MD;  Location: [facility];  Service: Orthopedics;  Laterality: N/A;   CARDIAC CATHETERIZATION     20 yrs ago   CARDIOVERSION N/A 07/27/2020   Procedure: CARDIOVERSION;  Surgeon: [name], MD;  Location: [facility];  Service: Cardiovascular;  Laterality: N/A;   CARDIOVERSION N/A 08/05/2021   Procedure: CARDIOVERSION;  Surgeon: [name], MD;  Location: [facility];  Service: Cardiovascular;  Laterality: N/A;   JOINT REPLACEMENT     NECK HARDWARE REMOVAL  12/11/2018   RADIOLOGY WITH ANESTHESIA N/A 05/15/2017   Procedure: MRI WITH ANESTHESIA CERVICAL SPINE WITH AND WITHOUT;  Surgeon:  Radiologist, Medication, MD;  Location: [facility];  Service: Radiology;  Laterality: N/A;   RIGHT HEART CATH N/A 09/28/2021   Procedure: RIGHT HEART CATH;  Surgeon: Jolaine Artist, MD;  Location: [facility];  Service: Cardiovascular;  Laterality: N/A;   SPINAL FUSION N/A 03/02/2016   Procedure: POSTERIOR SPINAL FUSION INTERBODY L2-S1, DECOMPRESSION L4-S1;  Surgeon: [name], MD;  Location: [facility];  Service: Orthopedics;  Laterality: N/A;   TOTAL KNEE ARTHROPLASTY Right 04/30/2017   Procedure: RIGHT TOTAL KNEE ARTHROPLASTY;  Surgeon: [name], MD;  Location: [facility];  Service: Orthopedics;  Laterality: Right;  :   Current Outpatient Medications:    acetaminophen (TYLENOL) 650 MG CR tablet, Take 650 mg by mouth every 8 (eight) hours as needed for pain., Disp: , Rfl:    amiodarone (PACERONE) 200 MG tablet, Take 1 tablet (200 mg total) by mouth daily., Disp: 90 tablet, Rfl: 3   Ascorbic Acid (VITAMIN Guerrero) 1000 MG tablet, Take 1,000 mg by mouth daily., Disp: , Rfl:    aspirin EC 81 MG tablet, Take 81 mg by mouth at bedtime., Disp: , Rfl:    atorvastatin (LIPITOR) 80 MG tablet, TAKE 1 TABLET($RemoveBefor'80MG'[id number]$ ) BY MOUTH EVERY OTHER DAY AND TAKE 1/2 TABLET BY MOUTH ON ALTERNATE DAYS, Disp: 90 tablet, Rfl: 3   Cannabidiol POWD, Apply 1 Application topically 3 (three) times daily as needed (neuropathy (feet)). CBD cream, Disp: , Rfl:    dabigatran (PRADAXA) 150 MG CAPS capsule, Take 1 capsule (150 mg total) by mouth 2 (two) times daily., Disp: 180 capsule, Rfl: 1   metoprolol tartrate (LOPRESSOR) 25 MG tablet, Take 1 tablet (25 mg total) by mouth 2 (two) times daily., Disp: 60 tablet, Rfl: 6   Multiple Vitamin (MULTIVITAMIN WITH MINERALS) TABS tablet, Take 1 tablet by mouth in the morning., Disp: , Rfl:    torsemide (DEMADEX) 20 MG tablet, Take 1 tablet (20 mg total) by mouth 2 (two) times daily., Disp: 180 tablet, Rfl: 3  Current Facility-Administered Medications:    sodium chloride flush (NS) 0.9 %  injection 3 mL, 3 mL, Intravenous, Q12H, Gregory Guerrero, Gregory Basques C, MD:   sodium chloride flush  3 mL Intravenous Q12H  :  No Known Allergies:   Family History  Problem Relation Age of Onset   Cancer - Other Mother    Heart attack Father    Uterine cancer Sister    Colon cancer Neg Hx    Prostate cancer Neg Hx   :   Social History   Socioeconomic History   Marital status: Married    Spouse name: Not on file   Number of children: 2   Years  of education: Not on file   Highest education level: Not on file  Occupational History   Occupation: retired , Pension scheme manager busines   Tobacco Use   Smoking status: Former    Types: Cigarettes    Quit date: 04/26/1983    Years since quitting: 38.5   Smokeless tobacco: Never   Tobacco comments:    quit over 40 years ago   Vaping Use   Vaping Use: Never used  Substance and Sexual Activity   Alcohol use: No   Drug use: No   Sexual activity: Never  Other Topics Concern   Not on file  Social History Narrative   Household pt, wife    [place] from [place] Determinants of Health   Financial Resource Strain: Not on file  Food Insecurity: Not on file  Transportation Needs: Not on file  Physical Activity: Not on file  Stress: Not on file  Social Connections: Not on file  Intimate Partner Violence: Not on file  :  Review of Systems  Constitutional:  Negative for malaise/fatigue.  HENT: Negative.    Eyes: Negative.   Respiratory:  Positive for shortness of breath.   Cardiovascular:  Positive for palpitations.  Gastrointestinal: Negative.   Genitourinary:  Positive for frequency.  Musculoskeletal:  Positive for joint pain.  Neurological:  Positive for dizziness.  Endo/Heme/Allergies: Negative.   Psychiatric/Behavioral: Negative.       Exam: His vital signs show temperature of 98.2.  Pulse 54.  Blood pressure 105/53.  Weight is 191 pounds. _0 @ Physical Exam Vitals reviewed.  HENT:     Head: Normocephalic and atraumatic.   Eyes:     Pupils: Pupils are equal, round, and reactive to light.  Cardiovascular:     Rate and Rhythm: Normal rate and regular rhythm.     Heart sounds: Normal heart sounds.  Pulmonary:     Effort: Pulmonary effort is normal.     Breath sounds: Normal breath sounds.  Abdominal:     General: Bowel sounds are normal.     Palpations: Abdomen is soft.  Musculoskeletal:        General: No tenderness or deformity. Normal range of motion.     Cervical back: Normal range of motion.  Lymphadenopathy:     Cervical: No cervical adenopathy.  Skin:    General: Skin is warm and dry.     Findings: No erythema or rash.  Neurological:     Mental Status: He is alert and oriented to person, place, and time.  Psychiatric:        Behavior: Behavior normal.        Thought Content: Thought content normal.        Judgment: Judgment normal.      Recent Labs    10/19/21 1412  WBC 5.5  HGB 11.2*  HCT 34.0*  PLT 162    Recent Labs    10/19/21 1412  NA 140  K 4.2  CL 104  CO2 28  GLUCOSE 97  BUN 67*  CREATININE 2.99*  CALCIUM 9.1    Blood smear review: Pending  Pathology: None    Assessment and Plan: Gregory Guerrero is a very nice 76 year old white male.  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  However, we will certainly check to make sure there is no systemic light chain amyloid that we are dealing with.  We will see  what his serum studies show with light chain.  We will do a note 24-hour urine on him.  Hopefully, this will tell us if there is any monoclonal light chains in his urine.  Is a little bit reassuring that the renal ultrasound does not show enlarged kidneys.  I just wonder if he may not need some type of renal biopsy.  This could certainly prove amyloid.  Ultimately, he may need to have a bone marrow biopsy done.  I would like to try to be as less invasive as possible if we can.  If we do document  systemic amyloidosis, then we can certainly try to treat this.  I am not sure how effective this will actually be in reverse any type of organ damage.  However, we do have newer treatments that can certainly be effective.  He and his wife are both very nice.  I truly enjoyed talking with him.  We will plan to get him back once we get the results back from his labs and possibly have him do a bone marrow test.

## 2021-10-20 ENCOUNTER — Encounter: Payer: Self-pay | Admitting: *Deleted

## 2021-10-20 ENCOUNTER — Other Ambulatory Visit: Payer: Self-pay | Admitting: Cardiovascular Disease

## 2021-10-20 ENCOUNTER — Ambulatory Visit (HOSPITAL_COMMUNITY)
Admission: RE | Admit: 2021-10-20 | Discharge: 2021-10-20 | Disposition: A | Discharge status: Home / Self Care | Payer: Medicare Other | Source: Ambulatory Visit | Attending: Cardiovascular Disease | Admitting: Cardiovascular Disease

## 2021-10-20 DIAGNOSIS — E859 Amyloidosis, unspecified: Secondary | ICD-10-CM | POA: Diagnosis not present

## 2021-10-20 LAB — KAPPA/LAMBDA LIGHT CHAINS
Kappa free light chain: 50.4 mg/L — ABNORMAL HIGH (ref 3.3–19.4)
Kappa, lambda light chain ratio: 1.87 — ABNORMAL HIGH (ref 0.26–1.65)
Lambda free light chains: 27 mg/L — ABNORMAL HIGH (ref 5.7–26.3)

## 2021-10-20 LAB — IGG, IGA, IGM
IgA: 194 mg/dL (ref 61–437)
IgG (Immunoglobin G), Serum: 895 mg/dL (ref 603–1613)
IgM (Immunoglobulin M), Srm: 77 mg/dL (ref 15–143)

## 2021-10-20 MED ORDER — LORAZEPAM 2 MG/ML IJ SOLN
1.0000 mg | Freq: Once | INTRAMUSCULAR | Status: AC
  Filled 2021-10-20: qty 0.5

## 2021-10-20 MED ORDER — LORAZEPAM 2 MG/ML IJ SOLN
INTRAMUSCULAR | Status: AC
  Administered 2021-10-20: 1 mg via INTRAVENOUS
  Filled 2021-10-20: qty 1

## 2021-10-20 MED ORDER — GADOBUTROL 1 MMOL/ML IV SOLN
10.0000 mL | Freq: Once | INTRAVENOUS | Status: AC | PRN
Start: 2021-10-20 — End: 2021-10-20
  Administered 2021-10-20: 10 mL via INTRAVENOUS

## 2021-10-20 NOTE — Progress Notes (Signed)
This patient seen in new patient consultation when this navigator was out of office. Dr Marin Olp would like to avoid invasive testing if possible. Once 24h urine received and resulted a decision will be made on whether or not the patient needs a bone marrow biopsy. Will follow for 24h urine.   Oncology Nurse Navigator Documentation     10/20/2021    2:00 PM  Oncology Nurse Navigator Flowsheets  Navigator Follow Up Date: 10/26/2021  Navigator Follow Up Reason: Test Results  Navigator Location [facility]  Navigator Encounter Type Appt/Treatment Plan Review  Patient Visit Type MedOnc  Treatment Phase Abnormal Scans;Abnormal Labs  Barriers/Navigation Needs Coordination of Care;Education  Interventions None Required  Acuity Level 2-Minimal Needs (1-2 Barriers Identified)  Support Groups/Services Friends and Family  Time Spent with Patient 15

## 2021-10-21 ENCOUNTER — Telehealth (HOSPITAL_COMMUNITY): Payer: Self-pay | Admitting: Pharmacist

## 2021-10-21 ENCOUNTER — Other Ambulatory Visit (HOSPITAL_COMMUNITY): Payer: Self-pay

## 2021-10-21 NOTE — Telephone Encounter (Signed)
Patient Advocate Encounter   Received notification from [facility] that prior authorization for Vyndamax is required.   PA submitted on CoverMyMeds Key [id number] Status is pending   Will continue to follow.   Audry Riles, PharmD, BCPS, BCCP, CPP Heart Failure Clinic Pharmacist [phone]

## 2021-10-21 NOTE — Progress Notes (Signed)
Will start prior authorization for Vyndamax (Tafamidis) and follow up.

## 2021-10-22 ENCOUNTER — Encounter: Payer: Self-pay | Admitting: Cardiovascular Disease

## 2021-10-24 ENCOUNTER — Other Ambulatory Visit (HOSPITAL_COMMUNITY): Payer: Self-pay

## 2021-10-24 NOTE — Telephone Encounter (Signed)
Called patient and his wife and informed them to take demadex every other day, and to let our office know if he has any other issues.

## 2021-10-24 NOTE — Telephone Encounter (Signed)
Called patient's wife. She stated patient's HR has been between 50 and 65. Will forward to Dr. Johnsie Cancel to see if HR is too low.

## 2021-10-24 NOTE — Telephone Encounter (Signed)
Advanced Heart Failure Patient Advocate Encounter  Prior Authorization for [employer] Iron has been approved.    PA# [id number] Effective dates: 10/21/21 through "until further notice".  Patients co-pay is $4540/30d; $7045.35/90d  Audry Riles, PharmD, BCPS, BCCP, CPP Heart Failure Clinic Pharmacist [phone]

## 2021-10-25 ENCOUNTER — Inpatient Hospital Stay: Payer: Medicare Other

## 2021-10-25 DIAGNOSIS — R350 Frequency of micturition: Secondary | ICD-10-CM | POA: Diagnosis not present

## 2021-10-25 DIAGNOSIS — R42 Dizziness and giddiness: Secondary | ICD-10-CM | POA: Diagnosis not present

## 2021-10-25 DIAGNOSIS — N289 Disorder of kidney and ureter, unspecified: Secondary | ICD-10-CM | POA: Diagnosis not present

## 2021-10-25 DIAGNOSIS — R0602 Shortness of breath: Secondary | ICD-10-CM | POA: Diagnosis not present

## 2021-10-25 DIAGNOSIS — M2550 Pain in unspecified joint: Secondary | ICD-10-CM | POA: Diagnosis not present

## 2021-10-25 DIAGNOSIS — R9431 Abnormal electrocardiogram [ECG] [EKG]: Secondary | ICD-10-CM | POA: Diagnosis not present

## 2021-10-27 LAB — UPEP/UIFE/LIGHT CHAINS/TP, 24-HR UR
% BETA, Urine: 0 %
ALPHA 1 URINE: 0 %
Albumin, U: 100 %
Alpha 2, Urine: 0 %
Free Kappa Lt Chains,Ur: 11.3 mg/L (ref 1.17–86.46)
Free Kappa/Lambda Ratio: 9.74 (ref 1.83–14.26)
Free Lambda Lt Chains,Ur: 1.16 mg/L (ref 0.27–15.21)
GAMMA GLOBULIN URINE: 0 %
Total Protein, Urine-Ur/day: 86 mg/24 hr (ref 30–150)
Total Protein, Urine: 4.3 mg/dL
Total Volume: 2000

## 2021-10-28 ENCOUNTER — Other Ambulatory Visit (HOSPITAL_COMMUNITY): Payer: Self-pay

## 2021-10-28 NOTE — Telephone Encounter (Signed)
Patient Advocate Encounter Was successful in securing patient an $65 grant from Patient [facility]) to provide copayment coverage for Vyndamax.  This will keep the out of pocket expense at $0.     I have spoken with the patient.    The billing information is as follows and has been shared with [place].   Member ID: [id number] Group ID: [id number] RxBin ID: [id number] PCN: [id number] Eligibility Start Date: 07/30/2021 Eligibility End Date: 10/28/2022 Assistance Amount: $8,500.00  Fund:  Amyloidosis   Patient Advocate Encounter Was successful in securing patient an $8000 grant from [employer] to provide copayment coverage for [employer].  This will keep the out of pocket expense at $0.    Member ID: [id number] Group ID: [id number] RxBin: [id number] PCN: [id number] Dates of Eligibility: 09/28/21 through 09/28/22  Fund:  Amyloidosis   Audry Riles, PharmD, BCPS, BCCP, CPP Heart Failure Clinic Pharmacist [phone]

## 2021-10-31 ENCOUNTER — Telehealth (HOSPITAL_COMMUNITY): Payer: Self-pay | Admitting: Pharmacy Technician

## 2021-10-31 ENCOUNTER — Other Ambulatory Visit (HOSPITAL_COMMUNITY): Payer: Self-pay

## 2021-10-31 MED ORDER — VYNDAMAX 61 MG PO CAPS
1.0000 | ORAL_CAPSULE | Freq: Every day | ORAL | 11 refills | Status: DC
  Filled 2021-10-31 (×2): qty 30, 30d supply, fill #0
  Filled 2021-11-28: qty 30, 30d supply, fill #1
  Filled 2021-12-23: qty 30, 30d supply, fill #2
  Filled 2022-01-25: qty 30, 30d supply, fill #3
  Filled 2022-02-22: qty 30, 30d supply, fill #4
  Filled 2022-03-27: qty 30, 30d supply, fill #5
  Filled 2022-04-20: qty 30, 30d supply, fill #6
  Filled 2022-05-30: qty 30, 30d supply, fill #7

## 2021-10-31 NOTE — Telephone Encounter (Signed)
Advanced Heart Failure Patient Advocate Encounter  Called and spoke with patient. He is aware that PA is approved for Vyndamax and that grants have been obtained on his behalf to help cover the cost. Set patient up with [place]. Fatima Sanger information added to [facility]. Patient would like to talk to the provider before starting the medication. Will pick up the medication on 07/26 to give to patient during appointment. He will use the mail option on subsequent fills.  Charlann Boxer, CPhT

## 2021-11-02 ENCOUNTER — Encounter: Payer: Self-pay | Admitting: Cardiovascular Disease

## 2021-11-02 ENCOUNTER — Other Ambulatory Visit: Payer: Self-pay | Admitting: Hematology & Oncology

## 2021-11-02 ENCOUNTER — Encounter: Payer: Self-pay | Admitting: *Deleted

## 2021-11-02 DIAGNOSIS — N184 Chronic kidney disease, stage 4 (severe): Secondary | ICD-10-CM

## 2021-11-02 NOTE — Progress Notes (Signed)
Dr Marin Olp has ordered a BMBx for final workup. No test thus far has show any sign of amyloidosis.   Called patient and explained the reason for this testing. Answered his and his wife's questions to their satisfaction and they are ready to schedule. Message sent to scheduler.   Oncology Nurse Navigator Documentation     11/02/2021    9:00 AM  Oncology Nurse Navigator Flowsheets  Navigator Follow Up Date: 11/22/2021  Navigator Follow Up Reason: Other:  Navigator Location [facility]  Navigator Encounter Type Telephone;Appt/Treatment Plan Review  Telephone Outgoing Call;Education  Patient Visit Type MedOnc  Treatment Phase Abnormal Scans;Abnormal Labs  Barriers/Navigation Needs Coordination of Care;Education  Education Other  Interventions Education;Psycho-Social Support  Acuity Level 2-Minimal Needs (1-2 Barriers Identified)  Education Method Verbal  Support Groups/Services Friends and Family  Time Spent with Patient 15

## 2021-11-07 ENCOUNTER — Other Ambulatory Visit (HOSPITAL_COMMUNITY): Payer: Self-pay

## 2021-11-08 ENCOUNTER — Other Ambulatory Visit (HOSPITAL_COMMUNITY): Payer: Self-pay

## 2021-11-09 ENCOUNTER — Ambulatory Visit (HOSPITAL_COMMUNITY)
Admission: RE | Admit: 2021-11-09 | Discharge: 2021-11-09 | Disposition: A | Discharge status: Home / Self Care | Payer: Medicare Other | Source: Ambulatory Visit | Attending: Internal Medicine | Admitting: Internal Medicine

## 2021-11-09 ENCOUNTER — Other Ambulatory Visit (HOSPITAL_COMMUNITY): Payer: Self-pay

## 2021-11-09 ENCOUNTER — Encounter (HOSPITAL_COMMUNITY): Payer: Self-pay | Admitting: Internal Medicine

## 2021-11-09 VITALS — BP 122/80 | HR 48 | Wt 192.4 lb

## 2021-11-09 DIAGNOSIS — Z79899 Other long term (current) drug therapy: Secondary | ICD-10-CM | POA: Diagnosis not present

## 2021-11-09 DIAGNOSIS — I5032 Chronic diastolic (congestive) heart failure: Secondary | ICD-10-CM | POA: Diagnosis not present

## 2021-11-09 DIAGNOSIS — N184 Chronic kidney disease, stage 4 (severe): Secondary | ICD-10-CM | POA: Diagnosis not present

## 2021-11-09 DIAGNOSIS — I10 Essential (primary) hypertension: Secondary | ICD-10-CM | POA: Diagnosis not present

## 2021-11-09 DIAGNOSIS — I2510 Atherosclerotic heart disease of native coronary artery without angina pectoris: Secondary | ICD-10-CM | POA: Diagnosis not present

## 2021-11-09 DIAGNOSIS — R42 Dizziness and giddiness: Secondary | ICD-10-CM | POA: Insufficient documentation

## 2021-11-09 DIAGNOSIS — E8589 Other amyloidosis: Secondary | ICD-10-CM

## 2021-11-09 DIAGNOSIS — E859 Amyloidosis, unspecified: Secondary | ICD-10-CM | POA: Diagnosis not present

## 2021-11-09 DIAGNOSIS — Z955 Presence of coronary angioplasty implant and graft: Secondary | ICD-10-CM | POA: Diagnosis not present

## 2021-11-09 DIAGNOSIS — E854 Organ-limited amyloidosis: Secondary | ICD-10-CM | POA: Diagnosis not present

## 2021-11-09 DIAGNOSIS — M549 Dorsalgia, unspecified: Secondary | ICD-10-CM | POA: Insufficient documentation

## 2021-11-09 DIAGNOSIS — I43 Cardiomyopathy in diseases classified elsewhere: Secondary | ICD-10-CM | POA: Insufficient documentation

## 2021-11-09 DIAGNOSIS — I480 Paroxysmal atrial fibrillation: Secondary | ICD-10-CM | POA: Insufficient documentation

## 2021-11-09 DIAGNOSIS — Z86718 Personal history of other venous thrombosis and embolism: Secondary | ICD-10-CM | POA: Insufficient documentation

## 2021-11-09 DIAGNOSIS — Z7901 Long term (current) use of anticoagulants: Secondary | ICD-10-CM | POA: Insufficient documentation

## 2021-11-09 DIAGNOSIS — I130 Hypertensive heart and chronic kidney disease with heart failure and stage 1 through stage 4 chronic kidney disease, or unspecified chronic kidney disease: Secondary | ICD-10-CM | POA: Diagnosis not present

## 2021-11-09 NOTE — Progress Notes (Addendum)
ADVANCED HF CLINIC CONSULT NOTE  Referring Physician: Dr. Johnsie Cancel  Primary Care: Colon Branch, MD Primary Cardiologist: Jenkins Rouge, MD   HPI:  76 yo male with CAD s/p remote stenting of LAD in [place], previous DVT, HTN, CKD IV, PAF and diastolic HF. REferred by Dr. 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   Has been followed by Dr. Johnsie Cancel. Brief cardiac history includes:   Echo 02/01/17 EF 19% grade 2 diastolic mild MR  ETT 62/2/29 normal    Noted new onset afib 07/27/20/ Started on renal dose xarelto 15 mg daily. -> DCCv 07/27/20 with conversion to NSR    In 07/22/21 recurrent AF/AFL  -> Had successful repeat [facility] on 08/05/21 Done on amiodarone 200 mg daily and pradaxa 150 mg bi.  Following with Dr. [name] for possible Watchman  Referred to me for RHC in 6/23 due to LE edema. RHC with prominent v-waves in PCWP tracing suspicious for MR vs severe diastolic dysfunction. TTE with mild MR. 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   RHC 09/28/21   RA = 7 RV = 48/8 PA = 52/16 (34) PCW = 22 (v = 40) Fick cardiac output/index = 4.1/2.0 PVR = 3.2 FA sat = 97% PA sat = 61%, 58%  Echo 6/23 EF 60% strain with cherry on top. RV low normal Mild MR  PYP 10/13/21  markedly positive    cMRI 10/20/21: EF 54% RV 51% Diffuse LGE with ECV 50%  Saw Dr. Marin Olp Serum light chains mildly elevated. Urine IFE normal . Felt not to have light chain amyloidosis. BMBx pending for confirmation .  Here with his wife. Remains on torsemide 40 mg qod. Breathing is fine. Limited by back pain and not SOB. In past has struggled with neuropathy but resolved 3 months ago. Gets dizzy when he stands up. SBP at home typically ~120. Lowest SBP 105 two weeks.      Review of Systems: [y] = yes, '[ ]'$  = no   General: Weight gain '[ ]'$ ; Weight loss '[ ]'$ ; Anorexia '[ ]'$ ; Fatigue '[ ]'$ ; Fever '[ ]'$ ; Chills '[ ]'$ ; Weakness '[ ]'$   Cardiac: Chest pain/pressure '[ ]'$ ; Resting SOB '[ ]'$ ; Exertional SOB '[ ]'$ ; Orthopnea '[ ]'$ ; Pedal  Edema '[ ]'$ ; Palpitations '[ ]'$ ; Syncope '[ ]'$ ; Presyncope '[ ]'$ ; Paroxysmal nocturnal dyspnea'[ ]'$   Pulmonary: Cough '[ ]'$ ; Wheezing'[ ]'$ ; Hemoptysis'[ ]'$ ; Sputum '[ ]'$ ; Snoring '[ ]'$   GI: Vomiting'[ ]'$ ; Dysphagia'[ ]'$ ; Melena'[ ]'$ ; Hematochezia '[ ]'$ ; Heartburn'[ ]'$ ; Abdominal pain '[ ]'$ ; Constipation '[ ]'$ ; Diarrhea '[ ]'$ ; BRBPR '[ ]'$   GU: Hematuria'[ ]'$ ; Dysuria '[ ]'$ ; Nocturia'[ ]'$   Vascular: Pain in legs with walking '[ ]'$ ; Pain in feet with lying flat '[ ]'$ ; Non-healing sores '[ ]'$ ; Stroke '[ ]'$ ; TIA '[ ]'$ ; Slurred speech '[ ]'$ ;  Neuro: Headaches'[ ]'$ ; Vertigo'[ ]'$ ; Seizures'[ ]'$ ; Paresthesias'[ ]'$ ;Blurred vision '[ ]'$ ; Diplopia '[ ]'$ ; Vision changes '[ ]'$   Ortho/Skin: Arthritis [ y]; Joint pain [ y]; Muscle pain '[ ]'$ ; Joint swelling '[ ]'$ ; Back Pain '[ ]'$ ; Rash '[ ]'$   Psych: Depression'[ ]'$ ; Anxiety'[ ]'$   Heme: Bleeding problems '[ ]'$ ; Clotting disorders '[ ]'$ ; Anemia '[ ]'$   Endocrine: Diabetes '[ ]'$ ; Thyroid dysfunction'[ ]'$    Past Medical History:  Diagnosis Date   Arthritis    CAD (coronary artery disease)    Chronic kidney disease    DVT (deep venous thrombosis) (HCC)    following spinal surgery Nov 2017   HTN (hypertension)  MI (myocardial infarction) ([place])    1999 ; stents placed    Numbness and tingling in both hands    Spondylosis of cervical spine     Current Outpatient Medications  Medication Sig Dispense Refill   acetaminophen (TYLENOL) 650 MG CR tablet Take 650 mg by mouth every 8 (eight) hours as needed for pain.     amiodarone (PACERONE) 200 MG tablet Take 1 tablet (200 mg total) by mouth daily. 90 tablet 3   Ascorbic Acid (VITAMIN C) 1000 MG tablet Take 1,000 mg by mouth daily.     aspirin EC 81 MG tablet Take 81 mg by mouth at bedtime.     atorvastatin (LIPITOR) 80 MG tablet TAKE 1 TABLET('80MG'$ ) BY MOUTH EVERY OTHER DAY AND TAKE 1/2 TABLET BY MOUTH ON ALTERNATE DAYS 90 tablet 3   Cannabidiol POWD Apply 1 Application topically 3 (three) times daily as needed (neuropathy (feet)). CBD cream     dabigatran (PRADAXA) 150 MG CAPS capsule Take 1 capsule  (150 mg total) by mouth 2 (two) times daily. 180 capsule 1   metoprolol tartrate (LOPRESSOR) 25 MG tablet Take 1 tablet (25 mg total) by mouth 2 (two) times daily. 60 tablet 6   Multiple Vitamin (MULTIVITAMIN WITH MINERALS) TABS tablet Take 1 tablet by mouth in the morning.     torsemide (DEMADEX) 20 MG tablet Take 20 mg by mouth every other day. 2 x aday qod     Tafamidis (VYNDAMAX) 61 MG CAPS Take 1 capsule by mouth daily. (Patient not taking: Reported on 11/09/2021) 30 capsule 11   Current Facility-Administered Medications  Medication Dose Route Frequency Provider Last Rate Last Admin   sodium chloride flush (NS) 0.9 % injection 3 mL  3 mL Intravenous Q12H [name], MD        No Known Allergies    Social History   Socioeconomic History   Marital status: Married    Spouse name: Not on file   Number of children: 2   Years of education: Not on file   Highest education level: Not on file  Occupational History   Occupation: retired , Pension scheme manager busines   Tobacco Use   Smoking status: Former    Types: Cigarettes    Quit date: 04/26/1983    Years since quitting: 38.5   Smokeless tobacco: Never   Tobacco comments:    quit over 40 years ago   Vaping Use   Vaping Use: Never used  Substance and Sexual Activity   Alcohol use: No   Drug use: No   Sexual activity: Never  Other Topics Concern   Not on file  Social History Narrative   Household pt, wife    [place] from [employer] Determinants of Health   Financial Resource Strain: Not on file  Food Insecurity: Not on file  Transportation Needs: Not on file  Physical Activity: Not on file  Stress: Not on file  Social Connections: Not on file  Intimate Partner Violence: Not on file      Family History  Problem Relation Age of Onset   Cancer - Other Mother    Heart attack Father    Uterine cancer Sister    Colon cancer Neg Hx    Prostate cancer Neg Hx     Vitals:   11/09/21 1453  BP: 122/80  Pulse: (!) 48   SpO2: 98%  Weight: 87.3 kg (192 lb 6.4 oz)    PHYSICAL EXAM: General:  Well  appearing. No respiratory difficulty HEENT: normal Neck: supple. no JVD. Carotids 2+ bilat; no bruits. No lymphadenopathy or thryomegaly appreciated. Cor: PMI nondisplaced. Regular rate & rhythm. No rubs, gallops or murmurs. Lungs: clear Abdomen: soft, nontender, nondistended. No hepatosplenomegaly. No bruits or masses. Good bowel sounds. Extremities: no cyanosis, clubbing, rash, edema Neuro: alert & oriented x 3, cranial nerves grossly intact. moves all 4 extremities w/o difficulty. Affect pleasant.  ECG: sinus brad 44 IVCD 177m 1AVB 267mPersonally reviewed    ASSESSMENT & PLAN:   1. 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 RV low normal Mild MR - PYP 10/13/21  markedly positive  - cMRI 10/20/21: EF 54% RV 51% Diffuse LGE with ECV 50% - Now starting Vyndamax - Saw Dr. EnMarin Olperum light chains mildly elevated. Urine IFE normal . Felt not to have light chain amyloidosis. BMBx pending for confirmation . - Long discussion about cardiac amyloid diagnosis and difference between TTR and AL amyloid - NYHA II. Volume status ok  - Will check genetic testing. If positive can consider adding Amvuttrra for polyneuropathy symptoms - Reports orthostatic symptoms  (which are common in TTR) but not orthostatic today. Can consider midodrine as needed  2. PAF - in sinus today on amio  - rare slow. Stop b-blocker - on Xarelto - Pending possible Watchman  3. CKD IV - SCr 2.5-3.0 - Needs nephrology f/u  Total time spent 55 minutes. Over half that time spent discussing above.   DaGlori BickersMD  8:17 PM

## 2021-11-09 NOTE — Patient Instructions (Signed)
Good to see you today    Stop metoprolol   Genetic test has been done, this has to be sent to [place] to be processed and can take 1-2 weeks to get results back.  We will let you know the results.  Your physician recommends that you schedule a follow-up appointment in: 3-4 months Call office in September to schedule an appoint  If you have any questions or concerns before your next appointment please send [place] a message through [place] or call our office at [phone].    TO LEAVE A MESSAGE FOR THE NURSE SELECT OPTION 2, PLEASE LEAVE A MESSAGE INCLUDING: YOUR NAME DATE OF BIRTH CALL BACK NUMBER REASON FOR CALL**this is important as we prioritize the call backs  YOU WILL RECEIVE A CALL BACK THE SAME DAY AS LONG AS YOU CALL BEFORE 4:00 PM  At the [facility], you and your health needs are our priority. As part of our continuing mission to provide you with exceptional heart care, we have created designated Provider Care Teams. These Care Teams include your primary Cardiologist (physician) and Advanced Practice Providers (APPs- Physician Assistants and Nurse Practitioners) who all work together to provide you with the care you need, when you need it.   You may see any of the following providers on your designated Care Team at your next follow up: Dr Glori Bickers Dr Haynes Kerns, NP Lyda Jester, [place] [facility], [place] Audry Riles, PharmD   Please be sure to bring in all your medications bottles to every appointment.

## 2021-11-09 NOTE — Progress Notes (Signed)
Blood collected for TTR genetic testing per Dr Bensimhon.  Order form completed and both shipped by [employer] to [facility].  

## 2021-11-10 ENCOUNTER — Telehealth: Payer: Self-pay

## 2021-11-10 NOTE — Telephone Encounter (Signed)
error 

## 2021-11-11 ENCOUNTER — Telehealth: Payer: Self-pay | Admitting: Cardiology

## 2021-11-11 NOTE — Telephone Encounter (Signed)
Error in charting.

## 2021-11-14 ENCOUNTER — Other Ambulatory Visit (HOSPITAL_COMMUNITY): Payer: Self-pay

## 2021-11-21 ENCOUNTER — Other Ambulatory Visit: Payer: Self-pay | Admitting: Internal Medicine

## 2021-11-21 DIAGNOSIS — N184 Chronic kidney disease, stage 4 (severe): Secondary | ICD-10-CM

## 2021-11-22 ENCOUNTER — Other Ambulatory Visit: Payer: Self-pay

## 2021-11-22 ENCOUNTER — Encounter: Payer: Self-pay | Admitting: *Deleted

## 2021-11-22 ENCOUNTER — Ambulatory Visit (HOSPITAL_COMMUNITY)
Admission: RE | Admit: 2021-11-22 | Discharge: 2021-11-22 | Disposition: A | Discharge status: Home / Self Care | Payer: Medicare Other | Source: Ambulatory Visit | Attending: Hematology & Oncology | Admitting: Hematology & Oncology

## 2021-11-22 ENCOUNTER — Encounter (HOSPITAL_COMMUNITY): Payer: Self-pay

## 2021-11-22 DIAGNOSIS — E8589 Other amyloidosis: Secondary | ICD-10-CM | POA: Diagnosis not present

## 2021-11-22 DIAGNOSIS — Z86718 Personal history of other venous thrombosis and embolism: Secondary | ICD-10-CM | POA: Diagnosis not present

## 2021-11-22 DIAGNOSIS — N289 Disorder of kidney and ureter, unspecified: Secondary | ICD-10-CM | POA: Diagnosis not present

## 2021-11-22 DIAGNOSIS — I252 Old myocardial infarction: Secondary | ICD-10-CM | POA: Diagnosis not present

## 2021-11-22 DIAGNOSIS — D539 Nutritional anemia, unspecified: Secondary | ICD-10-CM | POA: Insufficient documentation

## 2021-11-22 DIAGNOSIS — Z0389 Encounter for observation for other suspected diseases and conditions ruled out: Secondary | ICD-10-CM | POA: Diagnosis not present

## 2021-11-22 DIAGNOSIS — Z955 Presence of coronary angioplasty implant and graft: Secondary | ICD-10-CM | POA: Insufficient documentation

## 2021-11-22 DIAGNOSIS — Z31448 Encounter for other genetic testing of male for procreative management: Secondary | ICD-10-CM | POA: Insufficient documentation

## 2021-11-22 DIAGNOSIS — I43 Cardiomyopathy in diseases classified elsewhere: Secondary | ICD-10-CM | POA: Diagnosis not present

## 2021-11-22 DIAGNOSIS — N184 Chronic kidney disease, stage 4 (severe): Secondary | ICD-10-CM | POA: Diagnosis not present

## 2021-11-22 DIAGNOSIS — I129 Hypertensive chronic kidney disease with stage 1 through stage 4 chronic kidney disease, or unspecified chronic kidney disease: Secondary | ICD-10-CM | POA: Diagnosis not present

## 2021-11-22 DIAGNOSIS — I2510 Atherosclerotic heart disease of native coronary artery without angina pectoris: Secondary | ICD-10-CM | POA: Insufficient documentation

## 2021-11-22 DIAGNOSIS — Z7982 Long term (current) use of aspirin: Secondary | ICD-10-CM | POA: Insufficient documentation

## 2021-11-22 LAB — CBC WITH DIFFERENTIAL/PLATELET
Abs Immature Granulocytes: 0.01 10*3/uL (ref 0.00–0.07)
Basophils Absolute: 0 10*3/uL (ref 0.0–0.1)
Basophils Relative: 0 %
Eosinophils Absolute: 0.1 10*3/uL (ref 0.0–0.5)
Eosinophils Relative: 1 %
HCT: 33 % — ABNORMAL LOW (ref 39.0–52.0)
Hemoglobin: 11 g/dL — ABNORMAL LOW (ref 13.0–17.0)
Immature Granulocytes: 0 %
Lymphocytes Relative: 21 %
Lymphs Abs: 1.2 10*3/uL (ref 0.7–4.0)
MCH: 34.2 pg — ABNORMAL HIGH (ref 26.0–34.0)
MCHC: 33.3 g/dL (ref 30.0–36.0)
MCV: 102.5 fL — ABNORMAL HIGH (ref 80.0–100.0)
Monocytes Absolute: 0.9 10*3/uL (ref 0.1–1.0)
Monocytes Relative: 16 %
Neutro Abs: 3.5 10*3/uL (ref 1.7–7.7)
Neutrophils Relative %: 62 %
Platelets: 166 10*3/uL (ref 150–400)
RBC: 3.22 MIL/uL — ABNORMAL LOW (ref 4.22–5.81)
RDW: 14 % (ref 11.5–15.5)
WBC: 5.7 10*3/uL (ref 4.0–10.5)
nRBC: 0 % (ref 0.0–0.2)

## 2021-11-22 MED ORDER — SODIUM CHLORIDE 0.9 % IV SOLN: INTRAVENOUS | Status: DC

## 2021-11-22 MED ORDER — FENTANYL CITRATE (PF) 100 MCG/2ML IJ SOLN
INTRAMUSCULAR | Status: AC
  Filled 2021-11-22: qty 2

## 2021-11-22 MED ORDER — FENTANYL CITRATE (PF) 100 MCG/2ML IJ SOLN
INTRAMUSCULAR | Status: AC | PRN
  Administered 2021-11-22 (×2): 50 ug via INTRAVENOUS

## 2021-11-22 MED ORDER — LIDOCAINE HCL (PF) 1 % IJ SOLN
INTRAMUSCULAR | Status: AC | PRN
  Administered 2021-11-22: 20 mL

## 2021-11-22 NOTE — H&P (Signed)
Chief Complaint: Patient was seen in consultation today for eval for possible systemic amyloidosis at the request of Ennever,Peter R  Referring Physician(s): Ennever,Peter R  Supervising Physician: Aletta Edouard  Patient Status: [facility] - Out-pt  History of Present Illness: Gregory Guerrero is a 76 y.o. male with PMHs of HTN, CAD, MI s/p stents placement in 1999, DVT in 2017, renal insufficiency, cardiac amyloid who presents today for bone marrow aspiration and biopsy.   He was found to have trending up pro-BNP by his cardiologists, underwent echocardiogram on 6/16, NM on 6/29, and MR on 7/6  which showed imaging features consistent with amyloidosis. Patient was referred to hem/onc for evaluation of systemic amyloidosis. Patient underwent workup for systemic amyloidosis which has shown elevated kappa and lambda chain. Urine did not show any monoclonal protein.  A bone marrow aspiration and biopsy was recommended to the patient for further evaluation, rather than renal biopsy which could provide more information but certainly carries higher risks, especially with patient's hx of renal insufficiency. After thorough discussion and shared decision making, patient decided to proceed with BMBx.   Patient presents to [facility] IR for BMBx.   Patient laying in bed, not in acute distress.  Denise headache, fever, chills, shortness of breath, cough, chest pain, abdominal pain, nausea ,vomiting, and bleeding.   Past Medical History:  Diagnosis Date   Arthritis    CAD (coronary artery disease)    Chronic kidney disease    DVT (deep venous thrombosis) (HCC)    following spinal surgery Nov 2017   HTN (hypertension)    MI (myocardial infarction) ([place])    1999 ; stents placed    Numbness and tingling in both hands    Spondylosis of cervical spine     Past Surgical History:  Procedure Laterality Date   ANTERIOR CERVICAL DECOMP/DISCECTOMY FUSION N/A 06/27/2017   Procedure: ANTERIOR CERVICAL  DECOMPRESSION/DISCECTOMY FUSION C3-4;  Surgeon: [name], MD;  Location: [place];  Service: Orthopedics;  Laterality: N/A;  3 hrs   ANTERIOR CERVICAL DECOMP/DISCECTOMY FUSION  01/24/2019   ANTERIOR LAT LUMBAR FUSION N/A 03/01/2016   Procedure: EXTREME LATERAL INTERBODY FUSION  LUMBAR 2-4;  Surgeon: [name], MD;  Location: [place];  Service: Orthopedics;  Laterality: N/A;   CARDIAC CATHETERIZATION     20 yrs ago   CARDIOVERSION N/A 07/27/2020   Procedure: CARDIOVERSION;  Surgeon: [name], MD;  Location: [place];  Service: Cardiovascular;  Laterality: N/A;   CARDIOVERSION N/A 08/05/2021   Procedure: CARDIOVERSION;  Surgeon: [name], MD;  Location: [place];  Service: Cardiovascular;  Laterality: N/A;   JOINT REPLACEMENT     NECK HARDWARE REMOVAL  12/11/2018   RADIOLOGY WITH ANESTHESIA N/A 05/15/2017   Procedure: MRI WITH ANESTHESIA CERVICAL SPINE WITH AND WITHOUT;  Surgeon: Radiologist, Medication, MD;  Location: [place];  Service: Radiology;  Laterality: N/A;   RIGHT HEART CATH N/A 09/28/2021   Procedure: RIGHT HEART CATH;  Surgeon: Jolaine Artist, MD;  Location: [place] CV LAB;  Service: Cardiovascular;  Laterality: N/A;   SPINAL FUSION N/A 03/02/2016   Procedure: POSTERIOR SPINAL FUSION INTERBODY L2-S1, DECOMPRESSION L4-S1;  Surgeon: [name], MD;  Location: [place];  Service: Orthopedics;  Laterality: N/A;   TOTAL KNEE ARTHROPLASTY Right 04/30/2017   Procedure: RIGHT TOTAL KNEE ARTHROPLASTY;  Surgeon: [name], MD;  Location: [facility];  Service: Orthopedics;  Laterality: Right;    Allergies: Patient has no known allergies.  Medications: Prior to Admission medications  Medication Sig Start Date End Date Taking? Authorizing Provider  acetaminophen (TYLENOL) 650 MG CR tablet Take 650 mg by mouth every 8 (eight) hours as needed for pain.    [provider]  amiodarone (PACERONE) 200 MG tablet Take 1 tablet (200 mg total) by  mouth daily. 08/29/21   [name], MD  Ascorbic Acid (VITAMIN C) 1000 MG tablet Take 1,000 mg by mouth daily.    [provider]  aspirin EC 81 MG tablet Take 81 mg by mouth at bedtime.    [provider]  atorvastatin (LIPITOR) 80 MG tablet TAKE 1 TABLET('80MG'$ ) BY MOUTH EVERY OTHER DAY AND TAKE 1/2 TABLET BY MOUTH ON ALTERNATE DAYS 08/24/21   [name], MD  Cannabidiol POWD Apply 1 Application topically 3 (three) times daily as needed (neuropathy (feet)). CBD cream    [provider]  dabigatran (PRADAXA) 150 MG CAPS capsule Take 1 capsule (150 mg total) by mouth 2 (two) times daily. 07/25/21   [name], MD  Multiple Vitamin (MULTIVITAMIN WITH MINERALS) TABS tablet Take 1 tablet by mouth in the morning.    [provider]  Tafamidis (VYNDAMAX) 61 MG CAPS Take 1 capsule by mouth daily. Patient not taking: Reported on 11/09/2021 10/31/21   Bensimhon, Shaune Pascal, MD  torsemide (DEMADEX) 20 MG tablet Take 20 mg by mouth every other day. 2 x aday qod    [provider]     Family History  Problem Relation Age of Onset   Cancer - Other Mother    Heart attack Father    Uterine cancer Sister    Colon cancer Neg Hx    Prostate cancer Neg Hx     Social History   Socioeconomic History   Marital status: Married    Spouse name: Not on file   Number of children: 2   Years of education: Not on file   Highest education level: Not on file  Occupational History   Occupation: retired , Pension scheme manager busines   Tobacco Use   Smoking status: Former    Types: Cigarettes    Quit date: 04/26/1983    Years since quitting: 38.6   Smokeless tobacco: Never   Tobacco comments:    quit over 40 years ago   Vaping Use   Vaping Use: Never used  Substance and Sexual Activity   Alcohol use: No   Drug use: No   Sexual activity: Never  Other Topics Concern   Not on file  Social History Narrative   Household pt, wife    [place] from [place]  Determinants of Health   Financial Resource Strain: Not on file  Food Insecurity: Not on file  Transportation Needs: Not on file  Physical Activity: Not on file  Stress: Not on file  Social Connections: Not on file     Review of Systems: A 12 point ROS discussed and pertinent positives are indicated in the HPI above.  All other systems are negative.  Vital Signs: BP (!) 140/89   Pulse 90   Temp 98.8 F (37.1 C) (Oral)   Resp 20   Ht 5' 8.5" (1.74 m)   Wt 190 lb (86.2 kg)   SpO2 96%   BMI 28.47 kg/m    Physical Exam Vitals reviewed.  Constitutional:      General: He is not in acute distress.    Appearance: Normal appearance. He is not ill-appearing.  HENT:     Head:  Normocephalic.     Mouth/Throat:     Mouth: Mucous membranes are moist.     Pharynx: Oropharynx is clear.  Cardiovascular:     Rate and Rhythm: Normal rate and regular rhythm.     Heart sounds: Normal heart sounds.  Pulmonary:     Effort: Pulmonary effort is normal.     Breath sounds: Normal breath sounds.  Abdominal:     General: Abdomen is flat. Bowel sounds are normal.     Palpations: Abdomen is soft.  Musculoskeletal:     Cervical back: Neck supple.  Skin:    General: Skin is warm and dry.     Coloration: Skin is not jaundiced or pale.  Neurological:     Mental Status: He is alert and oriented to person, place, and time.  Psychiatric:        Mood and Affect: Mood normal.        Behavior: Behavior normal.        Judgment: Judgment normal.     MD Evaluation Airway: WNL Heart: WNL Abdomen: WNL Chest/ Lungs: WNL ASA  Classification: 3 Mallampati/Airway Score: Two  Imaging: No results found.  Labs:  CBC: Recent Labs    07/22/21 1036 08/05/21 0821 09/26/21 1651 09/28/21 1459 09/28/21 1500 10/19/21 1412 11/22/21 0945  WBC 5.3  --  6.0  --   --  5.5 5.7  HGB 13.8   < > 13.3 10.9* 12.9* 11.2* 11.0*  HCT 41.8   < > 40.4 32.0* 38.0* 34.0* 33.0*  PLT 157  --  156  --   --  162  166   < > = values in this interval not displayed.    COAGS: No results for input(s): "INR", "APTT" in the last 8760 hours.  BMP: Recent Labs    09/15/21 1456 09/26/21 1656 09/28/21 1459 09/28/21 1500 10/04/21 1453 10/19/21 1412  NA 142 145* 148* 142 141 140  K 4.5 4.7 3.4* 4.4 4.5 4.2  CL 100 103  --   --  104 104  CO2 24 26  --   --  29 28  GLUCOSE 76 83  --   --  81 97  BUN 47* 44*  --   --  44* 67*  CALCIUM 9.0 9.2  --   --  9.1 9.1  CREATININE 2.28* 2.66*  --   --  2.42* 2.99*  GFRNONAA  --   --   --   --   --  21*    LIVER FUNCTION TESTS: Recent Labs    08/02/21 1529 09/30/21 1353 10/19/21 1412  BILITOT 1.2  --  1.0  AST 45*  --  24  ALT 30  --  14  ALKPHOS 91  --  75  PROT 5.8* 5.8* 6.6  ALBUMIN 3.8  --  3.7    TUMOR MARKERS: No results for input(s): "AFPTM", "CEA", "CA199", "CHROMGRNA" in the last 8760 hours.  Assessment and Plan: 76 y.o. male with recent diagnosis of cardiac amyloidosis who is in need of BMBx for systemic amyloidosis evaluation.   Patient drank 2 oz of orange juice at 8 am today with his medication. - informed Dr. Kathlene Cote and IR team  VSS On ASA 81 mg QD and Pradaxa 150 mg BID  Risks and benefits of BMBX was discussed with the patient and/or patient's family including, but not limited to bleeding, infection, damage to adjacent structures or low yield requiring additional tests.  All of the questions were answered and  there is agreement to proceed.  Consent signed and in chart.   Thank you for this interesting consult.  I greatly enjoyed meeting Gregory Guerrero and look forward to participating in their care.  A copy of this report was sent to the requesting provider on this date.  Electronically Signed: Tera Mater, PA-C 11/22/2021, 10:24 AM   I spent a total of  30 Minutes   in face to face in clinical consultation, greater than 50% of which was counseling/coordinating care for BMBx.   This chart was dictated using voice  recognition software.  Despite best efforts to proofread,  errors can occur which can change the documentation meaning.

## 2021-11-22 NOTE — Procedures (Signed)
Interventional Radiology Procedure Note  Procedure: CT guided bone marrow aspiration and biopsy  Complications: None  EBL: < 10 mL  Findings: Aspirate and core biopsy performed of bone marrow in right iliac bone.  Plan: Bedrest supine x 1 hrs  Aryahi Denzler T. Leetta Hendriks, M.D Pager:  [phone]   

## 2021-11-22 NOTE — Discharge Instructions (Signed)

## 2021-11-22 NOTE — Progress Notes (Signed)
Patient returned from IR after CT bone Marrow biopsy alert and oriented x 4.  Patient had bleeding from biopsy site.  Direct pressure placed, Aimee Han, PA notified.  Aimee and Thomasenia Bottoms, RN replaced dressing.  Will continue to monitor.

## 2021-11-22 NOTE — Progress Notes (Signed)
Patient completed his bone marrow biopsy. Will follow for path results.   Oncology Nurse Navigator Documentation     11/22/2021   12:45 PM  Oncology Nurse Navigator Flowsheets  Navigator Follow Up Date: 11/28/2021  Navigator Follow Up Reason: Pathology  Navigator Location [facility]  Navigator Encounter Type Appt/Treatment Plan Review  Patient Visit Type MedOnc  Treatment Phase Abnormal Scans;Abnormal Labs  Barriers/Navigation Needs Coordination of Care;Education  Interventions None Required  Acuity Level 2-Minimal Needs (1-2 Barriers Identified)  Support Groups/Services Friends and Family  Time Spent with Patient 15

## 2021-11-24 LAB — SURGICAL PATHOLOGY

## 2021-11-28 ENCOUNTER — Encounter: Payer: Self-pay | Admitting: *Deleted

## 2021-11-28 ENCOUNTER — Other Ambulatory Visit (HOSPITAL_COMMUNITY): Payer: Self-pay

## 2021-11-28 ENCOUNTER — Encounter: Payer: Self-pay | Admitting: Hematology & Oncology

## 2021-11-28 NOTE — Progress Notes (Signed)
Reviewed pathology from bone marrow. Patient does not have myeloma. Will discontinue navigation as patient doesn't have a malignancy.  Oncology Nurse Navigator Documentation     11/28/2021    9:45 AM  Oncology Nurse Navigator Flowsheets  Navigation Complete Date: 11/28/2021  Post Navigation: Continue to Follow Patient? No  Reason Not Navigating Patient: No Cancer Diagnosis  Navigator Location [facility]  Navigator Encounter Type Pathology Review  Patient Visit Type MedOnc  Treatment Phase Abnormal Scans;Abnormal Labs  Barriers/Navigation Needs No Barriers At This Time  Interventions None Required  Acuity Level 1-No Barriers  Support Groups/Services Friends and Family  Time Spent with Patient 15

## 2021-11-30 ENCOUNTER — Other Ambulatory Visit (HOSPITAL_COMMUNITY): Payer: Self-pay

## 2021-12-02 ENCOUNTER — Encounter (HOSPITAL_COMMUNITY): Payer: Self-pay | Admitting: Hematology & Oncology

## 2021-12-11 NOTE — Progress Notes (Unsigned)
[facility]                                     Cardiology Office Note:    Date:  12/11/2021   ID:  Gregory Guerrero, DOB 1945-08-10, MRN [id number]  PCP:  Gregory Branch, Guerrero  [facility] Cardiologist:  Gregory Rouge, Guerrero  [facility] Electrophysiologist:  Gregory Epley, Guerrero   Referring Guerrero: Gregory Branch, Guerrero   No chief complaint on file. ***  History of Present Illness:    Gregory Guerrero is a 76 y.o. male with a hx of      76 yo male with CAD s/p remote stenting of LAD in [name], previous DVT, HTN, CKD IV, PAF and diastolic HF. 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    Has been followed by Gregory Guerrero. Brief cardiac history includes:   Echo 02/01/17 EF 90% grade 2 diastolic mild MR  ETT 30/0/92 normal    Noted new onset afib 07/27/20/ Started on renal dose xarelto 15 mg daily. -> DCCv 07/27/20 with conversion to NSR    In 07/22/21 recurrent AF/AFL  -> Had successful repeat [facility] on 08/05/21 Done on amiodarone 200 mg daily and pradaxa 150 mg bi.  Following with Dr. Quentin Guerrero for possible Watchman   Referred to me for RHC in 6/23 due to LE edema. RHC with prominent v-waves in PCWP tracing suspicious for MR vs severe diastolic dysfunction. TTE with mild MR. 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      Past Medical History:  Diagnosis Date   Arthritis    CAD (coronary artery disease)    Chronic kidney disease    DVT (deep venous thrombosis) (HCC)    following spinal surgery Nov 2017   HTN (hypertension)    MI (myocardial infarction) ([place])    1999 ; stents placed    Numbness and tingling in both hands    Spondylosis of cervical spine     Past Surgical History:  Procedure Laterality Date   ANTERIOR CERVICAL DECOMP/DISCECTOMY FUSION N/A 06/27/2017   Procedure: ANTERIOR CERVICAL DECOMPRESSION/DISCECTOMY FUSION C3-4;  Surgeon: Gregory Schools, Guerrero;  Location: [place];  Service:  Orthopedics;  Laterality: N/A;  3 hrs   ANTERIOR CERVICAL DECOMP/DISCECTOMY FUSION  01/24/2019   ANTERIOR LAT LUMBAR FUSION N/A 03/01/2016   Procedure: EXTREME LATERAL INTERBODY FUSION  LUMBAR 2-4;  Surgeon: Gregory Schools, Guerrero;  Location: [place];  Service: Orthopedics;  Laterality: N/A;   CARDIAC CATHETERIZATION     20 yrs ago   CARDIOVERSION N/A 07/27/2020   Procedure: CARDIOVERSION;  Surgeon: Gregory Hector, Guerrero;  Location: [place];  Service: Cardiovascular;  Laterality: N/A;   CARDIOVERSION N/A 08/05/2021   Procedure: CARDIOVERSION;  Surgeon: Gregory Lean, Guerrero;  Location: [place];  Service: Cardiovascular;  Laterality: N/A;   JOINT REPLACEMENT     NECK HARDWARE REMOVAL  12/11/2018   RADIOLOGY WITH ANESTHESIA N/A 05/15/2017   Procedure: MRI WITH ANESTHESIA CERVICAL SPINE WITH AND WITHOUT;  Surgeon: Gregory Guerrero;  Location: [place];  Service: Radiology;  Laterality: N/A;   RIGHT HEART CATH N/A 09/28/2021   Procedure: RIGHT HEART CATH;  Surgeon: Gregory Artist, Guerrero;  Location: [place] CV [facility];  Service: Cardiovascular;  Laterality: N/A;   SPINAL FUSION N/A 03/02/2016   Procedure: POSTERIOR  SPINAL FUSION INTERBODY L2-S1, DECOMPRESSION L4-S1;  Surgeon: Gregory Schools, Guerrero;  Location: [place];  Service: Orthopedics;  Laterality: N/A;   TOTAL KNEE ARTHROPLASTY Right 04/30/2017   Procedure: RIGHT TOTAL KNEE ARTHROPLASTY;  Surgeon: Gregory Arabian, Guerrero;  [facility]: [facility];  Service: Orthopedics;  Laterality: Right;    Current Medications: No outpatient medications have been marked as taking for the 12/14/21 encounter (Appointment) with [facility].   Current Facility-Administered Medications for the 12/14/21 encounter (Appointment) with [facility]  Medication   sodium chloride flush (NS) 0.9 % injection 3 mL     Allergies:   Patient has no known allergies.   Social History   Socioeconomic History   Marital status:  Married    Spouse name: Not on file   Number of children: 2   Years of education: Not on file   Highest education level: Not on file  Occupational History   Occupation: retired , Pension scheme manager busines   Tobacco Use   Smoking status: Former    Types: Cigarettes    Quit date: 04/26/1983    Years since quitting: 38.6   Smokeless tobacco: Never   Tobacco comments:    quit over 40 years ago   Vaping Use   Vaping Use: Never used  Substance and Sexual Activity   Alcohol use: No   Drug use: No   Sexual activity: Never  Other Topics Concern   Not on file  Social History Narrative   Household pt, wife    [place] from [place] Determinants of Health   Financial Resource Strain: Not on file  Food Insecurity: Not on file  Transportation Needs: Not on file  Physical Activity: Not on file  Stress: Not on file  Social Connections: Not on file     Family History: The patient's ***family history includes Cancer - Other in his mother; Heart attack in his father; Uterine cancer in his sister. There is no history of Gregory cancer or Prostate cancer.  ROS:   Please see the history of present illness.    All other systems reviewed and are negative.  EKGs/Labs/Other Studies Reviewed:    The following studies were reviewed today: ***  EKG:  EKG is *** ordered today.  The ekg ordered today demonstrates ***  Recent Labs: 08/02/2021: TSH 2.00 09/30/2021: NT-Pro BNP 7,936 10/19/2021: ALT 14; BUN 67; Creatinine 2.99; Potassium 4.2; Sodium 140 11/22/2021: Hemoglobin 11.0; Platelets 166  Recent Lipid Panel    Component Value Date/Time   CHOL 122 10/04/2021 1453   CHOL 166 01/15/2018 1516   TRIG 100.0 10/04/2021 1453   HDL 36.90 (L) 10/04/2021 1453   HDL 42 01/15/2018 1516   CHOLHDL 3 10/04/2021 1453   VLDL 20.0 10/04/2021 1453   LDLCALC 65 10/04/2021 1453   LDLCALC 86 01/15/2018 1516     Risk Assessment/Calculations:   {Does this patient have ATRIAL  FIBRILLATION?[phone]}   CHA2DS2-VASc Score = 4 [ CHF History: 1, HTN History: 0, Diabetes History: 0, Stroke History: 0, Vascular Disease History: 1, Age Score: 2, Gender Score: 0].  Therefore, the patient's annual risk of stroke is 4.8 %.        HAS-BLED score *** Hypertension (Uncontrolled in 30 days)  {YES/NO:21197} Abnormal renal and liver function (Dialysis, transplant, Cr >2.26 mg/dL /Cirrhosis or Bilirubin >2x Normal or AST/ALT/AP >3x Normal) {YES/NO:21197} Stroke {YES/NO:21197} Bleeding {YES/NO:21197} Labile INR (Unstable/high INR) {YES/NO:21197} Elderly (>65) {YES/NO:21197} Drugs or alcohol (? 8 drinks/week, anti-plt or  NSAID) {YES/NO:21197}   Physical Exam:    VS:  There were no vitals taken for this visit.    Wt Readings from Last 3 Encounters:  11/22/21 190 lb (86.2 kg)  11/09/21 192 lb 6.4 oz (87.3 kg)  10/19/21 191 lb (86.6 kg)     GEN: *** Well nourished, well developed in no acute distress HEENT: Normal NECK: No JVD; No carotid bruits LYMPHATICS: No lymphadenopathy CARDIAC: ***RRR, no murmurs, rubs, gallops RESPIRATORY:  Clear to auscultation without rales, wheezing or rhonchi  ABDOMEN: Soft, non-tender, non-distended MUSCULOSKELETAL:  No edema; No deformity  SKIN: Warm and dry NEUROLOGIC:  Alert and oriented x 3 PSYCHIATRIC:  Normal affect   ASSESSMENT:    No diagnosis found. PLAN:    In order of problems listed above:       {Are you ordering a CV Procedure (e.g. stress test, cath, DCCV, TEE, etc)?   Press F2        :[id number]}    Medication Adjustments/Labs and Tests Ordered: Current medicines are reviewed at length with the patient today.  Concerns regarding medicines are outlined above.  No orders of the defined types were placed in this encounter.  No orders of the defined types were placed in this encounter.   There are no Patient Instructions on file for this visit.   Signed, Kathyrn Drown, NP  12/11/2021 6:29 PM    [facility]

## 2021-12-12 ENCOUNTER — Encounter: Payer: Self-pay | Admitting: Hematology & Oncology

## 2021-12-12 ENCOUNTER — Ambulatory Visit: Payer: Medicare Other

## 2021-12-13 DIAGNOSIS — R194 Change in bowel habit: Secondary | ICD-10-CM | POA: Diagnosis not present

## 2021-12-13 DIAGNOSIS — K5904 Chronic idiopathic constipation: Secondary | ICD-10-CM | POA: Diagnosis not present

## 2021-12-13 NOTE — Progress Notes (Unsigned)
Virtual Visit via Telephone Note   Because of Gregory Guerrero's co-morbid illnesses, he is at least at moderate risk for complications without adequate follow up.  This format is felt to be most appropriate for this patient at this time.  The patient did not have access to video technology/had technical difficulties with video requiring transitioning to audio format only (telephone).  All issues noted in this document were discussed and addressed.  No physical exam could be performed with this format.  Please refer to the patient's chart for his consent to telehealth for [facility].   Date:  12/14/2021   ID:  Gregory Guerrero, DOB 1945-07-14, MRN [id number] The patient was identified using 2 identifiers.  Patient Location: Home Provider Location: Office/Clinic  PCP:  Gregory Guerrero, [place] [facility] Cardiologist:  Gregory Rouge, MD Electrophysiologist:  Gregory Epley, MD     Evaluation Performed:  Follow-Up Visit  Chief Complaint:  Cancelled Watchman   History of Present Illness:    Gregory Guerrero is a 76 y.o. male with a hx of CAD with remote stenting of LAD while living in [place], history of DVT, HTN, HLD, CRF, chronic venous disease with history of LE edema and chronic diastolic CHF recently found to have cardiac amyloid.   Gregory Guerrero has been followed by Gregory Guerrero for his cardiology care. He underwent an echocardiogram 02/01/2017 with an LVEF of 50% and grade 2 diastolic dysfunction, mild MR and severe biatrial enlargement. He had new onset AF 07/2020 and was started on renally dosed Xarelto with DCCV to NSR however continued to have issues with uncontrolled AF and cost concerns with anticoagulation. By 07/2021 he had recurrent AF with another successful cardioversion. He was started on Amiodarone and transitioned to Pradaxa. He was referred to Dr. [name] for ablation and watchman consideration. Due to ongoing LE edema he was referred to Dr. Haroldine Laws for  [place] which showed V waves suggestive of significant MR. Subsequent limited echo showed longitudinal strain with "cherry on top" pattern concerning for cardiac amyloid.  He was last seen by Dr. Haroldine Laws 7/26 with plans for possible genetic testing for further med selection based on results.    Case discussed with Dr. [name] with no plans to move forward with ablation/Watchman at this time.   Today I spoke to the patient and wife over the phone to explain this. They agree with this plan. They have concerns about being on long term amiodarone and wish to speak further with Gregory Guerrero about alternatives. Unclear if he is in AF at this time although he remains asymptomatic with no chest pain, palpitations, LE edema, SOB, dizziness, or syncope. He is complaint with Pradaxa. He does have a lot of bruising from this on his arms, legs, and feet. He does have an area of his toe that he is concerned about. There is no pain or paresthesias and coloration is improving.   Past Medical History:  Diagnosis Date   Arthritis    CAD (coronary artery disease)    Chronic kidney disease    DVT (deep venous thrombosis) (HCC)    following spinal surgery Nov 2017   HTN (hypertension)    MI (myocardial infarction) ([place])    1999 ; stents placed    Numbness and tingling in both hands    Spondylosis of cervical spine    Past Surgical History:  Procedure Laterality Date   ANTERIOR CERVICAL DECOMP/DISCECTOMY FUSION N/A 06/27/2017   Procedure:  ANTERIOR CERVICAL DECOMPRESSION/DISCECTOMY FUSION C3-4;  Surgeon: [name], MD;  Location: [place];  Service: Orthopedics;  Laterality: N/A;  3 hrs   ANTERIOR CERVICAL DECOMP/DISCECTOMY FUSION  01/24/2019   ANTERIOR LAT LUMBAR FUSION N/A 03/01/2016   Procedure: EXTREME LATERAL INTERBODY FUSION  LUMBAR 2-4;  Surgeon: [name], MD;  Location: [place];  Service: Orthopedics;  Laterality: N/A;   CARDIAC CATHETERIZATION     20 yrs ago   CARDIOVERSION N/A 07/27/2020    Procedure: CARDIOVERSION;  Surgeon: [name], MD;  Location: [place];  Service: Cardiovascular;  Laterality: N/A;   CARDIOVERSION N/A 08/05/2021   Procedure: CARDIOVERSION;  Surgeon: [name], MD;  Location: [place];  Service: Cardiovascular;  Laterality: N/A;   JOINT REPLACEMENT     NECK HARDWARE REMOVAL  12/11/2018   RADIOLOGY WITH ANESTHESIA N/A 05/15/2017   Procedure: MRI WITH ANESTHESIA CERVICAL SPINE WITH AND WITHOUT;  Surgeon: Radiologist, Medication, MD;  Location: [place];  Service: Radiology;  Laterality: N/A;   RIGHT HEART CATH N/A 09/28/2021   Procedure: RIGHT HEART CATH;  Surgeon: Jolaine Artist, MD;  Location: [place] [facility];  Service: Cardiovascular;  Laterality: N/A;   SPINAL FUSION N/A 03/02/2016   Procedure: POSTERIOR SPINAL FUSION INTERBODY L2-S1, DECOMPRESSION L4-S1;  Surgeon: [name], MD;  Location: [place];  Service: Orthopedics;  Laterality: N/A;   TOTAL KNEE ARTHROPLASTY Right 04/30/2017   Procedure: RIGHT TOTAL KNEE ARTHROPLASTY;  Surgeon: [name], MD;  [facility];  Service: Orthopedics;  Laterality: Right;     Current Meds  Medication Sig   acetaminophen (TYLENOL) 650 MG CR tablet Take 650 mg by mouth every 8 (eight) hours as needed for pain.   amiodarone (PACERONE) 200 MG tablet Take 1 tablet (200 mg total) by mouth daily.   Ascorbic Acid (VITAMIN C) 1000 MG tablet Take 1,000 mg by mouth daily.   aspirin EC 81 MG tablet Take 81 mg by mouth at bedtime.   atorvastatin (LIPITOR) 80 MG tablet TAKE 1 TABLET('80MG'$ ) BY MOUTH EVERY OTHER DAY AND TAKE 1/2 TABLET BY MOUTH ON ALTERNATE DAYS   dabigatran (PRADAXA) 150 MG CAPS capsule Take 1 capsule (150 mg total) by mouth 2 (two) times daily.   Multiple Vitamin (MULTIVITAMIN WITH MINERALS) TABS tablet Take 1 tablet by mouth in the morning.   Tafamidis (VYNDAMAX) 61 MG CAPS Take 1 capsule by mouth daily.   torsemide (DEMADEX) 20 MG tablet Take 20 mg by mouth every other day.  2 x aday qod   Current Facility-Administered Medications for the 12/14/21 encounter (Office Visit) with [facility]  Medication   sodium chloride flush (NS) 0.9 % injection 3 mL     Allergies:   Patient has no known allergies.   Social History   Tobacco Use   Smoking status: Former    Types: Cigarettes    Quit date: 04/26/1983    Years since quitting: 38.6   Smokeless tobacco: Never   Tobacco comments:    quit over 40 years ago   Vaping Use   Vaping Use: Never used  Substance Use Topics   Alcohol use: No   Drug use: No     Family Hx: The patient's family history includes Cancer - Other in his mother; Heart attack in his father; Uterine cancer in his sister. There is no history of Gregory cancer or Prostate cancer.  ROS:   Please see the history of present illness.     All other systems  reviewed and are negative.  Prior CV studies:   The following studies were reviewed today:  Myocardial amyloid imaging 10/10/21:    By semi-quantitative assessment scan is consistent with heart uptake greater than rib uptake-Grade 3. Heart to contralateral lung ratio is between 1-1.5, indeterminate for amyloid.   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   Prior study not available for comparison.   Unfortunately ROI for ratio mostly in LV cavity Entire myocardium clearly abnormal and lights up with uptake greater than ribs Scan is positive for amyloid will order cardiac MRI to confirm and refer to Dr Jeffie Pollock for possible Rx with Tafamidis    cMRI 10/20/21:  1. Normal LV size with disproportionate septal thickness 16 mm No RWMA;s EF 54%   2.  Normal RV size and function RVEF 51%   3. Abnormal T1/ECV suggesting infiltrative cardiomyopathy T1 1091 msec ECV 50%   4. Delayed gadolinium images markedly abnormal consistent with amyloidosis Diffuse severe uptake in septum and inferior base. Severe mid myocardial uptake in anterior  wall   Patient has had a positive Tc Pyrophosphate study and Echo with abnormal strain imaging with apical sparing all consistent with amyloidosis as well   Labs/Other Tests and Data Reviewed:    EKG:  No ECG reviewed.  Recent Labs: 08/02/2021: TSH 2.00 09/30/2021: NT-Pro BNP 7,936 10/19/2021: ALT 14; BUN 67; Creatinine 2.99; Potassium 4.2; Sodium 140 11/22/2021: Hemoglobin 11.0; Platelets 166   Recent Lipid Panel Lab Results  Component Value Date/Time   CHOL 122 10/04/2021 02:53 PM   CHOL 166 01/15/2018 03:16 PM   TRIG 100.0 10/04/2021 02:53 PM   HDL 36.90 (L) 10/04/2021 02:53 PM   HDL 42 01/15/2018 03:16 PM   CHOLHDL 3 10/04/2021 02:53 PM   LDLCALC 65 10/04/2021 02:53 PM   LDLCALC 86 01/15/2018 03:16 PM    Wt Readings from Last 3 Encounters:  12/14/21 185 lb (83.9 kg)  11/22/21 190 lb (86.2 kg)  11/09/21 192 lb 6.4 oz (87.3 kg)       Objective:    Vital Signs:  BP 134/80   Pulse 71   Ht 5' 8.5" (1.74 m)   Wt 185 lb (83.9 kg)   BMI 27.72 kg/m    VITAL SIGNS:  reviewed  ASSESSMENT & PLAN:    Atrial fibrillation: Referred to Dr. [name] for possible ablation/Watchman however found to have cardiac amyloid with strain on echo, positive PYP and cMRI results. Now followed by Dr. Haroldine Laws. Case discussed with Dr. [name] with no plans to pursue ablation/LAAO at this time.   Chronic diastolic CHF/cardiac amyloidosis: Echo 6/23 EF 60% strain with cherry on top. RV low normal Mild MR with positive PYP and cMRI results. Started on Tafamadis. Genetic testing considered at last appointment however the patient has deferred at this time. May consider given medication change based on results.    CKD stage IV: Last Cr from 10/19/21 at 2.99 with GFR at 21. Needs nephrology f/u    Time:   Today, I have spent 15 minutes with the patient with telehealth technology discussing the above problems.     Medication Adjustments/Labs and Tests Ordered: Current medicines are reviewed at length  with the patient today.  Concerns regarding medicines are outlined above.   Tests Ordered: No orders of the defined types were placed in this encounter.   Medication Changes: No orders of the defined types were placed in this encounter.   Follow Up:  In Person  with Dr. Haroldine Laws 01/2022 / Gregory Guerrero 02/2022  Signed, Kathyrn Drown, NP  12/14/2021 1:54 PM    [place]

## 2021-12-14 ENCOUNTER — Ambulatory Visit: Payer: Medicare Other | Attending: Cardiology | Admitting: Cardiology

## 2021-12-14 VITALS — BP 134/80 | HR 71 | Ht 68.5 in | Wt 185.0 lb

## 2021-12-14 DIAGNOSIS — E859 Amyloidosis, unspecified: Secondary | ICD-10-CM

## 2021-12-14 DIAGNOSIS — I10 Essential (primary) hypertension: Secondary | ICD-10-CM | POA: Diagnosis not present

## 2021-12-14 DIAGNOSIS — I480 Paroxysmal atrial fibrillation: Secondary | ICD-10-CM

## 2021-12-14 NOTE — Patient Instructions (Signed)
Medication Instructions:  Your physician recommends that you continue on your current medications as directed. Please refer to the Current Medication list given to you today.  *If you need a refill on your cardiac medications before your next appointment, please call your pharmacy*   Lab Work: NONE If you have labs (blood work) drawn today and your tests are completely normal, you will receive your results only by: [place] (if you have MyChart) OR A paper copy in the mail If you have any lab test that is abnormal or we need to change your treatment, we will call you to review the results.   Testing/Procedures: NONE   Follow-Up: At [facility], you and your health needs are our priority.  As part of our continuing mission to provide you with exceptional heart care, we have created designated Provider Care Teams.  These Care Teams include your primary Cardiologist (physician) and Advanced Practice Providers (APPs -  Physician Assistants and Nurse Practitioners) who all work together to provide you with the care you need, when you need it.  We recommend signing up for the patient portal called "MyChart".  Sign up information is provided on this After Visit Summary.  MyChart is used to connect with patients for Virtual Visits (Telemedicine).  Patients are able to view lab/test results, encounter notes, upcoming appointments, etc.  Non-urgent messages can be sent to your provider as well.   To learn more about what you can do with MyChart, go to [email or web address].     Important Information About Sugar

## 2021-12-23 ENCOUNTER — Other Ambulatory Visit (HOSPITAL_COMMUNITY): Payer: Self-pay

## 2021-12-24 ENCOUNTER — Telehealth: Payer: Medicare Other | Admitting: Nurse Practitioner

## 2021-12-24 DIAGNOSIS — B029 Zoster without complications: Secondary | ICD-10-CM | POA: Diagnosis not present

## 2021-12-24 DIAGNOSIS — I2510 Atherosclerotic heart disease of native coronary artery without angina pectoris: Secondary | ICD-10-CM | POA: Diagnosis not present

## 2021-12-24 MED ORDER — VALACYCLOVIR HCL 1 G PO TABS: 1000.0000 mg | ORAL_TABLET | Freq: Three times a day (TID) | ORAL | 0 refills | Status: DC

## 2021-12-24 NOTE — Patient Instructions (Signed)

## 2021-12-24 NOTE — Progress Notes (Signed)
Virtual Visit Consent   Gregory Guerrero, you are scheduled for a virtual visit with Gregory Guerrero, [name], a [facility] provider, today.     Just as with appointments in the office, your consent must be obtained to participate.  Your consent will be active for this visit and any virtual visit you may have with one of our providers in the next 365 days.     If you have a MyChart account, a copy of this consent can be sent to you electronically.  All virtual visits are billed to your insurance company just like a traditional visit in the office.    As this is a virtual visit, video technology does not allow for your provider to perform a traditional examination.  This may limit your provider's ability to fully assess your condition.  If your provider identifies any concerns that need to be evaluated in person or the need to arrange testing (such as labs, EKG, etc.), we will make arrangements to do so.     Although advances in technology are sophisticated, we cannot ensure that it will always work on either your end or our end.  If the connection with a video visit is poor, the visit may have to be switched to a telephone visit.  With either a video or telephone visit, we are not always able to ensure that we have a secure connection.     I need to obtain your verbal consent now.   Are you willing to proceed with your visit today? YES   DERRICH GABY has provided verbal consent on 12/24/2021 for a virtual visit (video or telephone).   Gregory Hassell Done, FNP   Date: 12/24/2021 8:11 AM   Virtual Visit via Video Note   I, Gregory Guerrero, connected with Gregory Guerrero ([id number], 05-Jun-1945) on 12/24/21 at  8:30 AM EDT by a video-enabled telemedicine application and verified that I am speaking with the correct person using two identifiers.  Location: Patient: Virtual Visit Location Patient: Home Provider: Virtual Visit Location Provider: Mobile   I discussed the limitations of  evaluation and management by telemedicine and the availability of in person appointments. The patient expressed understanding and agreed to proceed.    History of Present Illness: Gregory Guerrero is a 76 y.o. who identifies as a male who was assigned male at birth, and is being seen today for shingles.  HPI: Patient says he has developed a rash 4-5 days ago. It  is on the right side of neck and has become very painful. He has tried neurontin in the past for pain and does not want to take that.     Review of Systems  Constitutional:  Negative for diaphoresis and weight loss.  Eyes:  Negative for blurred vision, double vision and pain.  Respiratory:  Negative for shortness of breath.   Cardiovascular:  Negative for chest pain, palpitations, orthopnea and leg swelling.  Gastrointestinal:  Negative for abdominal pain.  Skin:  Negative for rash.  Neurological:  Negative for dizziness, sensory change, loss of consciousness, weakness and headaches.  Endo/Heme/Allergies:  Negative for polydipsia. Does not bruise/bleed easily.  Psychiatric/Behavioral:  Negative for memory loss. The patient does not have insomnia.   All other systems reviewed and are negative.   Problems:  Patient Active Problem List   Diagnosis Date Noted   CKD (chronic kidney disease) stage 4, GFR 15-29 ml/min ([place]) 10/05/2021   Annual physical exam 10/04/2021   Paroxysmal atrial fibrillation ([place])  Persistent atrial fibrillation ([place]) 06/29/2020   Secondary hypercoagulable state ([place]) 06/29/2020   S/P cervical spinal fusion 06/27/2017   OA (osteoarthritis) of knee 04/30/2017   Edema extremities 02/15/2017   PCP NOTES >>>>>>>>> 01/25/2017   Spinal stenosis of lumbar region without neurogenic claudication 01/23/2017   Hyperglycemia, unspecified 04/25/2013   Elevated lipids 04/25/2013   CAD (coronary artery disease)    HTN (hypertension)     Allergies: No Known Allergies Medications:  Current Outpatient Medications:     acetaminophen (TYLENOL) 650 MG CR tablet, Take 650 mg by mouth every 8 (eight) hours as needed for pain., Disp: , Rfl:    amiodarone (PACERONE) 200 MG tablet, Take 1 tablet (200 mg total) by mouth daily., Disp: 90 tablet, Rfl: 3   Ascorbic Acid (VITAMIN C) 1000 MG tablet, Take 1,000 mg by mouth daily., Disp: , Rfl:    aspirin EC 81 MG tablet, Take 81 mg by mouth at bedtime., Disp: , Rfl:    atorvastatin (LIPITOR) 80 MG tablet, TAKE 1 TABLET('80MG'$ ) BY MOUTH EVERY OTHER DAY AND TAKE 1/2 TABLET BY MOUTH ON ALTERNATE DAYS, Disp: 90 tablet, Rfl: 3   dabigatran (PRADAXA) 150 MG CAPS capsule, Take 1 capsule (150 mg total) by mouth 2 (two) times daily., Disp: 180 capsule, Rfl: 1   Multiple Vitamin (MULTIVITAMIN WITH MINERALS) TABS tablet, Take 1 tablet by mouth in the morning., Disp: , Rfl:    Tafamidis (VYNDAMAX) 61 MG CAPS, Take 1 capsule by mouth daily., Disp: 30 capsule, Rfl: 11   torsemide (DEMADEX) 20 MG tablet, Take 20 mg by mouth every other day. 2 x aday qod, Disp: , Rfl:   Current Facility-Administered Medications:    sodium chloride flush (NS) 0.9 % injection 3 mL, 3 mL, Intravenous, Q12H, Nishan, Wallis Bamberg, MD  Observations/Objective: Patient is well-developed, well-nourished in no acute distress.  Resting comfortably  at home.  Head is normocephalic, atraumatic.  No labored breathing.  Speech is clear and coherent with logical content.  Patient is alert and oriented at baseline.  Erythematous vesicular lesions down the right side of neck.   Assessment and Plan:  Gregory Guerrero in today with chief complaint of Herpes Zoster   1. Herpes zoster without complication Avoid rubbing or scratching Cool compresses Declined gabapentin Discussed getting valtrex past 72 hours of onset If gets closer to the eye need to see eye physician  Meds ordered this encounter  Medications   valACYclovir (VALTREX) 1000 MG tablet    Sig: Take 1 tablet (1,000 mg total) by mouth 3 (three) times daily.     Dispense:  21 tablet    Refill:  0    Order Specific Question:   Supervising Provider    Answer:   Noemi Chapel [[id number]]       Follow Up Instructions: I discussed the assessment and treatment plan with the patient. The patient was provided an opportunity to ask questions and all were answered. The patient agreed with the plan and demonstrated an understanding of the instructions.  A copy of instructions were sent to the patient via MyChart.  The patient was advised to call back or seek an in-person evaluation if the symptoms worsen or if the condition fails to improve as anticipated.  Time:  I spent 12 minutes with the patient via telehealth technology discussing the above problems/concerns.    Gregory Hassell Done, FNP

## 2021-12-28 ENCOUNTER — Telehealth (HOSPITAL_COMMUNITY): Payer: Self-pay | Admitting: *Deleted

## 2021-12-28 NOTE — Telephone Encounter (Signed)
[facility] TTR test done 11/09/21 is NEGATIVE, pt is aware

## 2021-12-30 ENCOUNTER — Ambulatory Visit (HOSPITAL_COMMUNITY): Admit: 2021-12-30 | Payer: Medicare Other | Admitting: Cardiology

## 2021-12-30 ENCOUNTER — Encounter (HOSPITAL_COMMUNITY): Payer: Self-pay

## 2021-12-30 SURGERY — ATRIAL FIBRILLATION ABLATION
Anesthesia: General

## 2022-01-04 ENCOUNTER — Other Ambulatory Visit (HOSPITAL_COMMUNITY): Payer: Self-pay

## 2022-01-13 ENCOUNTER — Other Ambulatory Visit (HOSPITAL_COMMUNITY): Payer: Self-pay | Admitting: Internal Medicine

## 2022-01-16 ENCOUNTER — Encounter: Payer: Self-pay | Admitting: Cardiovascular Disease

## 2022-01-25 ENCOUNTER — Other Ambulatory Visit (HOSPITAL_COMMUNITY): Payer: Self-pay

## 2022-01-31 ENCOUNTER — Other Ambulatory Visit (HOSPITAL_COMMUNITY): Payer: Self-pay

## 2022-02-03 ENCOUNTER — Encounter: Payer: Self-pay | Admitting: Internal Medicine

## 2022-02-03 ENCOUNTER — Ambulatory Visit (INDEPENDENT_AMBULATORY_CARE_PROVIDER_SITE_OTHER): Payer: Medicare Other | Admitting: *Deleted

## 2022-02-03 ENCOUNTER — Ambulatory Visit (INDEPENDENT_AMBULATORY_CARE_PROVIDER_SITE_OTHER): Payer: Medicare Other | Admitting: Internal Medicine

## 2022-02-03 VITALS — BP 120/82 | HR 55 | Temp 98.0°F | Resp 18 | Ht 68.5 in | Wt 189.5 lb

## 2022-02-03 DIAGNOSIS — I480 Paroxysmal atrial fibrillation: Secondary | ICD-10-CM | POA: Diagnosis not present

## 2022-02-03 DIAGNOSIS — Z0000 Encounter for general adult medical examination without abnormal findings: Secondary | ICD-10-CM | POA: Diagnosis not present

## 2022-02-03 DIAGNOSIS — I2510 Atherosclerotic heart disease of native coronary artery without angina pectoris: Secondary | ICD-10-CM

## 2022-02-03 DIAGNOSIS — Z23 Encounter for immunization: Secondary | ICD-10-CM | POA: Diagnosis not present

## 2022-02-03 DIAGNOSIS — I10 Essential (primary) hypertension: Secondary | ICD-10-CM

## 2022-02-03 DIAGNOSIS — N184 Chronic kidney disease, stage 4 (severe): Secondary | ICD-10-CM

## 2022-02-03 DIAGNOSIS — E785 Hyperlipidemia, unspecified: Secondary | ICD-10-CM | POA: Diagnosis not present

## 2022-02-03 DIAGNOSIS — N189 Chronic kidney disease, unspecified: Secondary | ICD-10-CM

## 2022-02-03 NOTE — Progress Notes (Addendum)
Subjective:   Gregory Guerrero Guerrero is a 76 y.o. male who presents for Medicare Annual/Subsequent preventive examination.  I connected with  Gregory Guerrero Guerrero on 02/03/22 by a audio enabled telemedicine application and verified that I am speaking with the correct person using two identifiers.  Patient Location: Home  Provider Location: Office/Clinic  I discussed the limitations of evaluation and management by telemedicine. The patient expressed understanding and agreed to proceed.   Review of Systems    Defer to PCP Cardiac Risk Factors include: advanced age (>70mn, >>52women);dyslipidemia;hypertension     Objective:    There were no vitals filed for this visit. There is no height or weight on file to calculate BMI.     02/03/2022   11:03 AM 11/22/2021   10:00 AM 10/19/2021    2:33 PM 09/28/2021    9:08 AM 08/05/2021    7:40 AM 07/27/2020    9:21 AM 04/21/2019   11:54 AM  Advanced Directives  Does Patient Have a Medical Advance Directive? Yes Yes No No No No No  Type of AParamedicof [place] will [place] will       Does patient want to make changes to medical advance directive?  No - Patient declined       Copy of HCrestlinein Chart? Yes - validated most recent copy scanned in chart (See row information) No - copy requested       Would patient like information on creating a medical advance directive? No - Patient declined  No - Patient declined No - Patient declined No - Patient declined No - Patient declined No - Patient declined    Current Medications (verified) Outpatient Encounter Medications as of 02/03/2022  Medication Sig   acetaminophen (TYLENOL) 650 MG CR tablet Take 650 mg by mouth every 8 (eight) hours as needed for pain.   amiodarone (PACERONE) 200 MG tablet Take 1 tablet (200 mg total) by mouth daily.   Ascorbic Acid (VITAMIN C) 1000 MG tablet Take 1,000 mg by mouth daily.   aspirin EC 81 MG  tablet Take 81 mg by mouth at bedtime.   atorvastatin (LIPITOR) 80 MG tablet TAKE 1 TABLET('80MG'$ ) BY MOUTH EVERY OTHER DAY AND TAKE 1/2 TABLET BY MOUTH ON ALTERNATE DAYS   dabigatran (PRADAXA) 150 MG CAPS capsule Take 1 capsule (150 mg total) by mouth 2 (two) times daily.   Multiple Vitamin (MULTIVITAMIN WITH MINERALS) TABS tablet Take 1 tablet by mouth in the morning.   Tafamidis (VYNDAMAX) 61 MG CAPS Take 1 capsule by mouth daily.   torsemide (DEMADEX) 20 MG tablet Take 20 mg by mouth daily. 2 x aday qod   valACYclovir (VALTREX) 1000 MG tablet Take 1 tablet (1,000 mg total) by mouth 3 (three) times daily.   Facility-Administered Encounter Medications as of 02/03/2022  Medication   sodium chloride flush (NS) 0.9 % injection 3 mL    Allergies (verified) Patient has no known allergies.   History: Past Medical History:  Diagnosis Date   Arthritis    CAD (coronary artery disease)    Chronic kidney disease    DVT (deep venous thrombosis) (HCC)    following spinal surgery Nov 2017   HTN (hypertension)    MI [place]) [place]    [place] ; stents placed    Numbness and tingling in both hands    Spondylosis of cervical spine    Past Surgical History:  Procedure Laterality Date  ANTERIOR CERVICAL DECOMP/DISCECTOMY FUSION N/A 06/27/2017   Procedure: ANTERIOR CERVICAL DECOMPRESSION/DISCECTOMY FUSION C3-4;  Surgeon: [name], MD;  Location: [place];  Service: Orthopedics;  Laterality: N/A;  3 hrs   ANTERIOR CERVICAL DECOMP/DISCECTOMY FUSION  01/24/2019   ANTERIOR LAT LUMBAR FUSION N/A 03/01/2016   Procedure: EXTREME LATERAL INTERBODY FUSION  LUMBAR 2-4;  Surgeon: [name], MD;  Location: [place];  Service: Orthopedics;  Laterality: N/A;   CARDIAC CATHETERIZATION     20 yrs ago   CARDIOVERSION N/A 07/27/2020   Procedure: CARDIOVERSION;  Surgeon: [name], MD;  Location: [place];  Service: Cardiovascular;  Laterality: N/A;   CARDIOVERSION N/A 08/05/2021    Procedure: CARDIOVERSION;  Surgeon: [name], MD;  Location: [place];  Service: Cardiovascular;  Laterality: N/A;   JOINT REPLACEMENT     NECK HARDWARE REMOVAL  12/11/2018   RADIOLOGY WITH ANESTHESIA N/A 05/15/2017   Procedure: MRI WITH ANESTHESIA CERVICAL SPINE WITH AND WITHOUT;  Surgeon: Radiologist, Medication, MD;  Location: [place];  Service: Radiology;  Laterality: N/A;   RIGHT HEART CATH N/A 09/28/2021   Procedure: RIGHT HEART CATH;  Surgeon: Jolaine Artist, MD;  Location: [place] [facility];  Service: Cardiovascular;  Laterality: N/A;   SPINAL FUSION N/A 03/02/2016   Procedure: POSTERIOR SPINAL FUSION INTERBODY L2-S1, DECOMPRESSION L4-S1;  Surgeon: [name], MD;  Location: [place];  Service: Orthopedics;  Laterality: N/A;   TOTAL KNEE ARTHROPLASTY Right 04/30/2017   Procedure: RIGHT TOTAL KNEE ARTHROPLASTY;  Surgeon: [name], MD;  Location: [facility];  Service: Orthopedics;  Laterality: Right;   Family History  Problem Relation Age of Onset   Cancer - Other Mother    Heart attack Father    Uterine cancer Sister    Colon cancer Neg Hx    Prostate cancer Neg Hx    Social History   Socioeconomic History   Marital status: Married    Spouse name: Not on file   Number of children: 2   Years of education: Not on file   Highest education level: Not on file  Occupational History   Occupation: retired , Pension scheme manager busines   Tobacco Use   Smoking status: Former    Types: Cigarettes    Quit date: 04/26/1983    Years since quitting: 38.8   Smokeless tobacco: Never   Tobacco comments:    quit over 40 years ago   Vaping Use   Vaping Use: Never used  Substance and Sexual Activity   Alcohol use: No   Drug use: No   Sexual activity: Never  Other Topics Concern   Not on file  Social History Narrative   Household pt, wife    [place] from [place] Determinants of Health   Financial Resource Strain: [place]  (02/03/2022)   Overall Financial  Resource Strain (CARDIA)    Difficulty of Paying Living Expenses: Not hard at all  Food Insecurity: No Food Insecurity (02/03/2022)   Hunger Vital Sign    Worried About Running Out of Food in the Last Year: Never true    Gregory Guerrero in the Last Year: Never true  Transportation Needs: No Transportation Needs (02/03/2022)   PRAPARE - Hydrologist (Medical): No    Lack of Transportation (Non-Medical): No  Physical Activity: Inactive (02/03/2022)   Exercise Vital Sign    Days of Exercise per Week: 0 days    Minutes of Exercise per Session: 0  min  Stress: No Stress Concern Present (02/03/2022)   [place]    Feeling of Stress : Not at all  Social Connections: Socially Isolated (02/03/2022)   Social Connection and Isolation Panel [NHANES]    Frequency of Communication with Friends and Family: Once a week    Frequency of Social Gatherings with Friends and Family: Never    Attends Religious Services: Never    Printmaker: No    Attends Music therapist: Never    Marital Status: Married    Tobacco Counseling Counseling given: Not Answered Tobacco comments: quit over 40 years ago    Clinical Intake:  Pre-visit preparation completed: Yes  Pain : No/denies pain  How often do you need to have someone help you when you read instructions, pamphlets, or other written materials from your doctor or pharmacy?: 1 - Never  Diabetic? No  Activities of Daily Living    02/03/2022   11:07 AM  In your present state of health, do you have any difficulty performing the following activities:  Hearing? 1  Vision? 0  Difficulty concentrating or making decisions? 0  Walking or climbing stairs? 0  Comment slight discomfort from previous back surgery  Dressing or bathing? 0  Doing errands, shopping? 0  Preparing Food and eating ? N  Using the Toilet? N  In  the past six months, have you accidently leaked urine? N  Do you have problems with loss of bowel control? N  Managing your Medications? N  Managing your Finances? N  Housekeeping or managing your Housekeeping? N    Patient Care Team: Colon Branch, MD as PCP - General (Internal Medicine) [name], MD as PCP - Cardiology (Cardiology) Vickie Epley, MD as PCP - Electrophysiology (Cardiology) Pieter Partridge, DO as Consulting Physician (Neurology) [name], MD as Consulting Physician (Orthopedic Surgery) [name], MD as Consulting Physician (Orthopedic Surgery) Volanda Napoleon, MD as Medical Oncologist (Oncology)  Indicate any recent Medical Services you may have received from other than [facility] providers in the past year (date may be approximate).     Assessment:   This is a routine wellness examination for Gregory Guerrero.  Hearing/Vision screen No results found.  Dietary issues and exercise activities discussed: Current Exercise Habits: Home exercise routine, Type of exercise: walking, Time (Minutes): 60, Frequency (Times/Week): 7, Weekly Exercise (Minutes/Week): 420, Intensity: Mild, Exercise limited by: None identified   Goals Addressed   None    Depression Screen    02/03/2022   11:03 AM 08/02/2021    2:20 PM 04/21/2019   12:01 PM 02/14/2017    1:45 PM 01/24/2017    3:15 PM  PHQ 2/9 Scores  PHQ - 2 Score 0 0 0 0 0    Fall Risk    02/03/2022   11:03 AM 08/02/2021    2:21 PM 04/21/2019   12:00 PM 11/15/2018    6:38 PM 03/01/2017    2:04 PM  [place] in the past year? 0 0 0  No  Comment    Emmi Telephone Survey: data to providers prior to load   Number falls in past yr: 0 0     Comment    Emmi Telephone Survey Actual Response =    Injury with Fall? 0 0     Risk for fall due to : No Fall Risks      Follow up Falls evaluation  completed Falls evaluation completed       FALL RISK PREVENTION PERTAINING TO THE HOME:  Any stairs in or around the  home? Yes  If so, are there any without handrails? No  Home free of loose throw rugs in walkways, pet beds, electrical cords, etc? Yes  Adequate lighting in your home to reduce risk of falls? Yes   ASSISTIVE DEVICES UTILIZED TO PREVENT FALLS:  Life alert? No  Use of a cane, walker or w/c? Yes  Grab bars in the bathroom? No  Shower chair or bench in shower? No  Elevated toilet seat or a handicapped toilet? No   TIMED UP AND GO:  Was the test performed?  Audio visit .    Cognitive Function:        02/03/2022   11:11 AM  6CIT Screen  What Year? 0 points  What month? 0 points  What time? 0 points  Count back from 20 0 points  Months in reverse 0 points  Repeat phrase 0 points  Total Score 0 points    Immunizations Immunization History  Administered Date(s) Administered   Influenza, High Dose Seasonal PF 02/04/2018, 01/10/2019   Influenza-Unspecified 01/23/2017   PFIZER(Purple Top)SARS-COV-2 Vaccination 05/08/2019, 05/29/2019, 02/04/2020   Pneumococcal Polysaccharide-23 02/04/2018    TDAP status: Due, Education has been provided regarding the importance of this vaccine. Advised may receive this vaccine at local pharmacy or Health Dept. Aware to provide a copy of the vaccination record if obtained from local pharmacy or Health Dept. Verbalized acceptance and understanding.  Flu Vaccine status: Due, Education has been provided regarding the importance of this vaccine. Advised may receive this vaccine at local pharmacy or Health Dept. Aware to provide a copy of the vaccination record if obtained from local pharmacy or Health Dept. Verbalized acceptance and understanding.  Pneumococcal vaccine status: Due, Education has been provided regarding the importance of this vaccine. Advised may receive this vaccine at local pharmacy or Health Dept. Aware to provide a copy of the vaccination record if obtained from local pharmacy or Health Dept. Verbalized acceptance and  understanding.  Covid-19 vaccine status: Information provided on how to obtain vaccines.   Qualifies for Shingles Vaccine? Yes   Zostavax completed No   Shingrix Completed?: No.    Education has been provided regarding the importance of this vaccine. Patient has been advised to call insurance company to determine out of pocket expense if they have not yet received this vaccine. Advised may also receive vaccine at local pharmacy or Health Dept. Verbalized acceptance and understanding.  Screening Tests Health Maintenance  Topic Date Due   Hepatitis C Screening  Never done   TETANUS/TDAP  Never done   Zoster Vaccines- Shingrix (1 of 2) Never done   Pneumonia Vaccine 72+ Years old (2 - PCV) 02/05/2019   COVID-19 Vaccine (4 - Pfizer risk series) 03/31/2020   INFLUENZA VACCINE  11/15/2021   HPV VACCINES  Aged Out    Health Maintenance  Health Maintenance Due  Topic Date Due   Hepatitis C Screening  Never done   TETANUS/TDAP  Never done   Zoster Vaccines- Shingrix (1 of 2) Never done   Pneumonia Vaccine 72+ Years old (2 - PCV) 02/05/2019   COVID-19 Vaccine (4 - Pfizer risk series) 03/31/2020   INFLUENZA VACCINE  11/15/2021    Colorectal cancer screening: No longer required.   Lung Cancer Screening: (Low Dose CT Chest recommended if Age 44-80 years, 30 pack-year currently smoking OR have quit w/in  15years.) does not qualify.   Lung Cancer Screening Referral: N/a  Additional Screening:  Hepatitis C Screening: does not qualify;   Vision Screening: Recommended annual ophthalmology exams for early detection of glaucoma and other disorders of the eye. Is the patient up to date with their annual eye exam?  No  Who is the provider or what is the name of the office in which the patient attends annual eye exams? N/a If pt is not established with a provider, would they like to be referred to a provider to establish care? No .   Dental Screening: Recommended annual dental exams for proper  oral hygiene  Community Resource Referral / Chronic Care Management: CRR required this visit?  No   CCM required this visit?  No      Plan:     I have personally reviewed and noted the following in the patient's chart:   Medical and social history Use of alcohol, tobacco or illicit drugs  Current medications and supplements including opioid prescriptions. Patient is not currently taking opioid prescriptions. Functional ability and status Nutritional status Physical activity Advanced directives List of other physicians Hospitalizations, surgeries, and ER visits in previous 12 months Vitals Screenings to include cognitive, depression, and falls Referrals and appointments  In addition, I have reviewed and discussed with patient certain preventive protocols, quality metrics, and best practice recommendations. A written personalized care plan for preventive services as well as general preventive health recommendations were provided to patient.   Due to this being a telephonic visit, the after visit summary with patients personalized plan was offered to patient via mail or my-chart.  Patient would like to access on my-chart.  Beatris Ship, [place]   02/03/2022   Nurse Notes: None  I have reviewed and agree with Health Coaches documentation.  Kathlene November, MD

## 2022-02-03 NOTE — Progress Notes (Unsigned)
Subjective:    Patient ID: Gregory Guerrero, male    DOB: 09-30-1945, 76 y.o.   MRN: [id number]  DOS:  02/03/2022 Type of visit - description: f/u   Since the last office visit is feeling well. Has seen multiple doctors and had multiple tests. Chart is reviewed.  Review of Systems See above   Past Medical History:  Diagnosis Date   Arthritis    CAD (coronary artery disease)    Chronic kidney disease    DVT (deep venous thrombosis) (HCC)    following spinal surgery Nov 2017   HTN (hypertension)    MI (myocardial infarction) ([place])    1999 ; stents placed    Numbness and tingling in both hands    Spondylosis of cervical spine     Past Surgical History:  Procedure Laterality Date   ANTERIOR CERVICAL DECOMP/DISCECTOMY FUSION N/A 06/27/2017   Procedure: ANTERIOR CERVICAL DECOMPRESSION/DISCECTOMY FUSION C3-4;  Surgeon: [name], MD;  Location: [facility];  Service: Orthopedics;  Laterality: N/A;  3 hrs   ANTERIOR CERVICAL DECOMP/DISCECTOMY FUSION  01/24/2019   ANTERIOR LAT LUMBAR FUSION N/A 03/01/2016   Procedure: EXTREME LATERAL INTERBODY FUSION  LUMBAR 2-4;  Surgeon: [name], MD;  Location: [facility];  Service: Orthopedics;  Laterality: N/A;   CARDIAC CATHETERIZATION     20 yrs ago   CARDIOVERSION N/A 07/27/2020   Procedure: CARDIOVERSION;  Surgeon: [name], MD;  Location: [facility];  Service: Cardiovascular;  Laterality: N/A;   CARDIOVERSION N/A 08/05/2021   Procedure: CARDIOVERSION;  Surgeon: [name], MD;  Location: [facility];  Service: Cardiovascular;  Laterality: N/A;   JOINT REPLACEMENT     NECK HARDWARE REMOVAL  12/11/2018   RADIOLOGY WITH ANESTHESIA N/A 05/15/2017   Procedure: MRI WITH ANESTHESIA CERVICAL SPINE WITH AND WITHOUT;  Surgeon: Radiologist, Medication, MD;  Location: [facility];  Service: Radiology;  Laterality: N/A;   RIGHT HEART CATH N/A 09/28/2021   Procedure: RIGHT HEART CATH;  Surgeon: Jolaine Artist, MD;  Location: [facility];  Service: Cardiovascular;  Laterality: N/A;   SPINAL FUSION N/A 03/02/2016   Procedure: POSTERIOR SPINAL FUSION INTERBODY L2-S1, DECOMPRESSION L4-S1;  Surgeon: [name], MD;  Location: [facility];  Service: Orthopedics;  Laterality: N/A;   TOTAL KNEE ARTHROPLASTY Right 04/30/2017   Procedure: RIGHT TOTAL KNEE ARTHROPLASTY;  Surgeon: [name], MD;  Location: [facility];  Service: Orthopedics;  Laterality: Right;    Current Outpatient Medications  Medication Instructions   acetaminophen (TYLENOL) 650 mg, Oral, Every 8 hours PRN   amiodarone (PACERONE) 200 mg, Oral, Daily   aspirin EC 81 mg, Oral, Daily at bedtime   atorvastatin (LIPITOR) 80 MG tablet TAKE 1 TABLET('80MG'$ ) BY MOUTH EVERY OTHER DAY AND TAKE 1/2 TABLET BY MOUTH ON ALTERNATE DAYS   dabigatran (PRADAXA) 150 mg, Oral, 2 times daily   Multiple Vitamin (MULTIVITAMIN WITH MINERALS) TABS tablet 1 tablet, Oral, Every morning   Tafamidis (VYNDAMAX) 61 MG CAPS 1 capsule, Oral, Daily   torsemide (DEMADEX) 20 mg, Oral, Daily   valACYclovir (VALTREX) 1,000 mg, Oral, 3 times daily   vitamin C 1,000 mg, Oral, Daily       Objective:   Physical Exam BP 120/82   Pulse (!) 55   Temp 98 F (36.7 C) (Oral)   Resp 18   Ht 5' 8.5" (1.74 m)   Wt 189 lb 8 oz (86 kg)   SpO2 98%   BMI 28.39 kg/m  General:  Well developed, NAD, BMI noted. HEENT:  Normocephalic . Face symmetric, atraumatic Lungs:  CTA B Normal respiratory effort, no intercostal retractions, no accessory muscle use. Heart: RRR,  no murmur.  Lower extremities: no pretibial edema bilaterally  Skin: Not pale. Not jaundice Neurologic:  alert & oriented X3.  Speech normal, gait appropriate for age and unassisted Psych--  Cognition and judgment appear intact.  Cooperative with normal attention span and concentration.  Behavior appropriate. No anxious or depressed appearing.      Assessment     Assessment--- new patient 01/24/2017 HTN, dx ~  2010 Hyperlipidemia CKD CV: -CAD, history of remote MI; EET 02-23-17 w/ no ischemia - Diastolic CHF -H/o L  DVT ~ 02/2016 after back surgery - Chronic venous dz - Paroxysmal A-fib Dx 05-2020 during routine, visit [facility], L face, last derm note 2014 Lower extremity ulcer, right leg after an injury H/o elevated PSA per urology note 2014, DRE normal, PSA was 3.7.  Cervical myelopathy: Surgery 06/27/2017: Anterior cervical decompression and discectomy with fusion of C3-C4   PLAN HTN: BP today is very good, on Demadex.  No change. Hyperlipidemia: Last LDL 65, on Lipitor.  No change CKD: Refer to nephrology.  Recommend also good hydration and avoid NSAIDs. Cardiac amyloidosis: Saw Dr. Marin Olp 10/19/2021: Was evaluated to rule out amyloidosis, work-up done.  Bone marrow BX done 11/22/2021, no amyloid deposits.  Also had normal male karyotype. TTR test was negative. 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 Vaccine advice: Got a flu shot today We talk about COVID-vaccine, PNM 20, Shingrix (shingles few weeks ago).  Advised that pros>>cons RTC 6 months  Time spent 30 minutes, extensive chart review.  Multiple questions answered regards to vaccines. CKD: Referred to nephrology.  Advised about CKD provided.

## 2022-02-03 NOTE — Patient Instructions (Signed)
Gregory Guerrero , Thank you for taking time to come for your Medicare Wellness Visit. I appreciate your ongoing commitment to your health goals. Please review the following plan we discussed and let me know if I can assist you in the future.   These are the goals we discussed:  Goals      Have knee surgery/ improve knee pain        This is a list of the screening recommended for you and due dates:  Health Maintenance  Topic Date Due   Hepatitis C Screening: USPSTF Recommendation to screen - Ages 32-79 yo.  Never done   Tetanus Vaccine  Never done   Zoster (Shingles) Vaccine (1 of 2) Never done   Pneumonia Vaccine (2 - PCV) 02/05/2019   COVID-19 Vaccine (4 - Pfizer risk series) 03/31/2020   Flu Shot  11/15/2021   HPV Vaccine  Aged Out     Next appointment: Follow up in one year for your annual wellness visit.   Preventive Care 54 Years and Older, Male Preventive care refers to lifestyle choices and visits with your health care provider that can promote health and wellness. What does preventive care include? A yearly physical exam. This is also called an annual well check. Dental exams once or twice a year. Routine eye exams. Ask your health care provider how often you should have your eyes checked. Personal lifestyle choices, including: Daily care of your teeth and gums. Regular physical activity. Eating a healthy diet. Avoiding tobacco and drug use. Limiting alcohol use. Practicing safe sex. Taking low doses of aspirin every day. Taking vitamin and mineral supplements as recommended by your health care provider. What happens during an annual well check? The services and screenings done by your health care provider during your annual well check will depend on your age, overall health, lifestyle risk factors, and family history of disease. Counseling  Your health care provider may ask you questions about your: Alcohol use. Tobacco use. Drug use. Emotional well-being. Home  and relationship well-being. Sexual activity. Eating habits. History of falls. Memory and ability to understand (cognition). Work and work Statistician. Screening  You may have the following tests or measurements: Height, weight, and BMI. Blood pressure. Lipid and cholesterol levels. These may be checked every 5 years, or more frequently if you are over 71 years old. Skin check. Lung cancer screening. You may have this screening every year starting at age 32 if you have a 30-pack-year history of smoking and currently smoke or have quit within the past 15 years. Fecal occult blood test (FOBT) of the stool. You may have this test every year starting at age 65. Flexible sigmoidoscopy or colonoscopy. You may have a sigmoidoscopy every 5 years or a colonoscopy every 10 years starting at age 81. Prostate cancer screening. Recommendations will vary depending on your family history and other risks. Hepatitis C blood test. Hepatitis B blood test. Sexually transmitted disease (STD) testing. Diabetes screening. This is done by checking your blood sugar (glucose) after you have not eaten for a while (fasting). You may have this done every 1-3 years. Abdominal aortic aneurysm (AAA) screening. You may need this if you are a current or former smoker. Osteoporosis. You may be screened starting at age 88 if you are at high risk. Talk with your health care provider about your test results, treatment options, and if necessary, the need for more tests. Vaccines  Your health care provider may recommend certain vaccines, such as: Influenza vaccine.  This is recommended every year. Tetanus, diphtheria, and acellular pertussis (Tdap, Td) vaccine. You may need a Td booster every 10 years. Zoster vaccine. You may need this after age 58. Pneumococcal 13-valent conjugate (PCV13) vaccine. One dose is recommended after age 57. Pneumococcal polysaccharide (PPSV23) vaccine. One dose is recommended after age 26. Talk to  your health care provider about which screenings and vaccines you need and how often you need them. This information is not intended to replace advice given to you by your health care provider. Make sure you discuss any questions you have with your health care provider. Document Released: 04/30/2015 Document Revised: 12/22/2015 Document Reviewed: 02/02/2015 Elsevier Interactive Patient Education  2017 [place] Prevention in the Home Falls can cause injuries. They can happen to people of all ages. There are many things you can do to make your home safe and to help prevent falls. What can I do on the outside of my home? Regularly fix the edges of walkways and driveways and fix any cracks. Remove anything that might make you trip as you walk through a door, such as a raised step or threshold. Trim any bushes or trees on the path to your home. Use bright outdoor lighting. Clear any walking paths of anything that might make someone trip, such as rocks or tools. Regularly check to see if handrails are loose or broken. Make sure that both sides of any steps have handrails. Any raised decks and porches should have guardrails on the edges. Have any leaves, snow, or ice cleared regularly. Use sand or salt on walking paths during winter. Clean up any spills in your garage right away. This includes oil or grease spills. What can I do in the bathroom? Use night lights. Install grab bars by the toilet and in the tub and shower. Do not use towel bars as grab bars. Use non-skid mats or decals in the tub or shower. If you need to sit down in the shower, use a plastic, non-slip stool. Keep the floor dry. Clean up any water that spills on the floor as soon as it happens. Remove soap buildup in the tub or shower regularly. Attach bath mats securely with double-sided non-slip rug tape. Do not have throw rugs and other things on the floor that can make you trip. What can I do in the bedroom? Use  night lights. Make sure that you have a light by your bed that is easy to reach. Do not use any sheets or blankets that are too big for your bed. They should not hang down onto the floor. Have a firm chair that has side arms. You can use this for support while you get dressed. Do not have throw rugs and other things on the floor that can make you trip. What can I do in the kitchen? Clean up any spills right away. Avoid walking on wet floors. Keep items that you use a lot in easy-to-reach places. If you need to reach something above you, use a strong step stool that has a grab bar. Keep electrical cords out of the way. Do not use floor polish or wax that makes floors slippery. If you must use wax, use non-skid floor wax. Do not have throw rugs and other things on the floor that can make you trip. What can I do with my stairs? Do not leave any items on the stairs. Make sure that there are handrails on both sides of the stairs and use them. Fix handrails  that are broken or loose. Make sure that handrails are as long as the stairways. Check any carpeting to make sure that it is firmly attached to the stairs. Fix any carpet that is loose or worn. Avoid having throw rugs at the top or bottom of the stairs. If you do have throw rugs, attach them to the floor with carpet tape. Make sure that you have a light switch at the top of the stairs and the bottom of the stairs. If you do not have them, ask someone to add them for you. What else can I do to help prevent falls? Wear shoes that: Do not have high heels. Have rubber bottoms. Are comfortable and fit you well. Are closed at the toe. Do not wear sandals. If you use a stepladder: Make sure that it is fully opened. Do not climb a closed stepladder. Make sure that both sides of the stepladder are locked into place. Ask someone to hold it for you, if possible. Clearly mark and make sure that you can see: Any grab bars or handrails. First and last  steps. Where the edge of each step is. Use tools that help you move around (mobility aids) if they are needed. These include: Canes. Walkers. Scooters. Crutches. Turn on the lights when you go into a dark area. Replace any light bulbs as soon as they burn out. Set up your furniture so you have a clear path. Avoid moving your furniture around. If any of your floors are uneven, fix them. If there are any pets around you, be aware of where they are. Review your medicines with your doctor. Some medicines can make you feel dizzy. This can increase your chance of falling. Ask your doctor what other things that you can do to help prevent falls. This information is not intended to replace advice given to you by your health care provider. Make sure you discuss any questions you have with your health care provider. Document Released: 01/28/2009 Document Revised: 09/09/2015 Document Reviewed: 05/08/2014 Elsevier Interactive Patient Education  2017 [employer].

## 2022-02-03 NOTE — Patient Instructions (Addendum)
Vaccines I recommend:  Tdap (tetanus) Shingrix (shingles) Covid booster RSV vaccine   Check the  blood pressure regularly BP GOAL is between 110/65 and  135/85. If it is consistently higher or lower, let me know   We are referring to nephrology because your kidney is not functioning well. Keep yourself hydrated and avoid any anti-inflammatories such as ibuprofen or naproxen   GO TO THE FRONT DESK, PLEASE SCHEDULE YOUR APPOINTMENTS Come back for checkup in 6 months

## 2022-02-04 NOTE — Assessment & Plan Note (Signed)
HTN: BP today is very good, on Demadex.  No change. Hyperlipidemia: Last LDL 65, on Lipitor.  No change CKD: Refer to nephrology.  Recommend also good hydration and avoid NSAIDs. Cardiac amyloidosis: Saw Dr. Marin Olp 10/19/2021: Was evaluated to rule out amyloidosis, work-up done.  Bone marrow BX done 11/22/2021, no amyloid deposits.  Also had normal male karyotype. TTR test was negative. 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 Vaccine advice: Got a flu shot today We talk about COVID-vaccine, PNM 20, Shingrix (shingles few weeks ago).  Advised that pros>>cons RTC 6 months

## 2022-02-10 DIAGNOSIS — K5140 Inflammatory polyps of colon without complications: Secondary | ICD-10-CM | POA: Diagnosis not present

## 2022-02-10 DIAGNOSIS — K641 Second degree hemorrhoids: Secondary | ICD-10-CM | POA: Diagnosis not present

## 2022-02-10 DIAGNOSIS — K5904 Chronic idiopathic constipation: Secondary | ICD-10-CM | POA: Diagnosis not present

## 2022-02-10 DIAGNOSIS — Q438 Other specified congenital malformations of intestine: Secondary | ICD-10-CM | POA: Diagnosis not present

## 2022-02-10 DIAGNOSIS — I2510 Atherosclerotic heart disease of native coronary artery without angina pectoris: Secondary | ICD-10-CM | POA: Diagnosis not present

## 2022-02-10 DIAGNOSIS — I480 Paroxysmal atrial fibrillation: Secondary | ICD-10-CM | POA: Diagnosis not present

## 2022-02-10 DIAGNOSIS — I129 Hypertensive chronic kidney disease with stage 1 through stage 4 chronic kidney disease, or unspecified chronic kidney disease: Secondary | ICD-10-CM | POA: Diagnosis not present

## 2022-02-10 DIAGNOSIS — K635 Polyp of colon: Secondary | ICD-10-CM | POA: Diagnosis not present

## 2022-02-10 DIAGNOSIS — Z955 Presence of coronary angioplasty implant and graft: Secondary | ICD-10-CM | POA: Diagnosis not present

## 2022-02-10 DIAGNOSIS — Z87891 Personal history of nicotine dependence: Secondary | ICD-10-CM | POA: Diagnosis not present

## 2022-02-10 DIAGNOSIS — N189 Chronic kidney disease, unspecified: Secondary | ICD-10-CM | POA: Diagnosis not present

## 2022-02-10 LAB — HM COLONOSCOPY

## 2022-02-16 NOTE — Progress Notes (Signed)
Date:  02/24/2022   ID:  Gregory Guerrero, DOB 1945/08/06, MRN [id number]  PCP:  Colon Branch, MD  Cardiologist:  Dr.Brayan Votaw   Chief Complaint: Dyspnea  History of Present Illness:   76 y.o. remote stenting of LAD in [name] . History of DVT, HTN, HLD and CRF Chronic venous Disease with edema and right sided ulcers in past. 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 PYP 10/13/21 positive and Cardiac MRI  10/20/21  markedly positive with diffuse LGE uptake EF 54% and ECV 50  Started on Tafamidis 10/31/21  Genetic testing TTR negative has maintained NSR on amiodarone Taking Pradaxa for anticoagulation Edema improved with demedex 20 mg daily    Concerned about long term side effects amiodarone Has had Cr as high as 2.99 with GFR 21 and QRS 118 msec QT corrected for bradycardia in 500 range so Tikosyn not ideal. Has CAD with stenting of LAD LVH and preserved EF   He was in rate controlled afib today Has had some tinnitus/dysequilibrium Discussed recurrence of PAF  He will need to f/u with Dr [name] to discuss ablation / Watchman    Past Medical History:  Diagnosis Date   Arthritis    CAD (coronary artery disease)    Chronic kidney disease    DVT (deep venous thrombosis) ([place])    following spinal surgery Nov 2017   HTN (hypertension)    MI (myocardial infarction) ([place])    1999 ; stents placed    Numbness and tingling in both hands    Spondylosis of cervical spine     Past Surgical History:  Procedure Laterality Date   ANTERIOR CERVICAL DECOMP/DISCECTOMY FUSION N/A 06/27/2017   Procedure: ANTERIOR CERVICAL  DECOMPRESSION/DISCECTOMY FUSION C3-4;  Surgeon: [name], MD;  Location: [place];  Service: Orthopedics;  Laterality: N/A;  3 hrs   ANTERIOR CERVICAL DECOMP/DISCECTOMY FUSION  01/24/2019   ANTERIOR LAT LUMBAR FUSION N/A 03/01/2016   Procedure: EXTREME LATERAL INTERBODY FUSION  LUMBAR 2-4;  Surgeon: [name], MD;  Location: [place];  Service: Orthopedics;  Laterality: N/A;   CARDIAC CATHETERIZATION     20 yrs ago   CARDIOVERSION N/A 07/27/2020   Procedure: CARDIOVERSION;  Surgeon: [name], MD;  Location: [place];  Service: Cardiovascular;  Laterality: N/A;   CARDIOVERSION N/A 08/05/2021   Procedure: CARDIOVERSION;  Surgeon: [name], MD;  Location: [place];  Service: Cardiovascular;  Laterality: N/A;   JOINT REPLACEMENT     NECK HARDWARE REMOVAL  12/11/2018   RADIOLOGY WITH ANESTHESIA N/A 05/15/2017   Procedure: MRI WITH ANESTHESIA CERVICAL SPINE WITH AND WITHOUT;  Surgeon: Radiologist, Medication, MD;  Location: [place];  Service: Radiology;  Laterality: N/A;   RIGHT HEART CATH N/A 09/28/2021   Procedure: RIGHT HEART CATH;  Surgeon: Jolaine Artist, MD;  Location: [place] [facility];  Service: Cardiovascular;  Laterality: N/A;   SPINAL FUSION N/A 03/02/2016   Procedure: POSTERIOR SPINAL FUSION INTERBODY L2-S1, DECOMPRESSION L4-S1;  Surgeon: [name], MD;  Location: [place];  Service: Orthopedics;  Laterality: N/A;   TOTAL KNEE ARTHROPLASTY Right 04/30/2017   Procedure: RIGHT TOTAL KNEE ARTHROPLASTY;  Surgeon: [name],  MD;  [facility];  Service: Orthopedics;  Laterality: Right;    Current Medications: Prior to Admission medications   Medication Sig Start Date End Date Taking? Authorizing Provider  Ascorbic Acid (VITAMIN C) 1000 MG tablet Take 1,000 mg by mouth daily.    [provider]  aspirin EC 81 MG tablet Take 81 mg by mouth daily.    [provider]  furosemide (LASIX) 20 MG tablet Take 1 tablet (20 mg total) by  mouth daily. 01/25/17   Colon Branch, MD  losartan (COZAAR) 100 MG tablet Take 1 tablet (100 mg total) by mouth daily. 01/05/17   [name], MD  Multiple Vitamin (MULTIVITAMIN WITH MINERALS) TABS tablet Take 1 tablet by mouth daily.    [provider]  rosuvastatin (CRESTOR) 20 MG tablet Take 1 tablet (20 mg total) by mouth at bedtime. 01/24/17   Colon Branch, MD    Allergies:   Patient has no known allergies.   Social History   Socioeconomic History   Marital status: Married    Spouse name: Not on file   Number of children: 2   Years of education: Not on file   Highest education level: Not on file  Occupational History   Occupation: retired , Pension scheme manager busines   Tobacco Use   Smoking status: Former    Types: Cigarettes    Quit date: 04/26/1983    Years since quitting: 38.8   Smokeless tobacco: Never   Tobacco comments:    quit over 40 years ago   Vaping Use   Vaping Use: Never used  Substance and Sexual Activity   Alcohol use: No   Drug use: No   Sexual activity: Never  Other Topics Concern   Not on file  Social History Narrative   Household pt, wife    [place] from [place] Determinants of Health   Financial Resource Strain: [place]  (02/03/2022)   Overall Financial Resource Strain (CARDIA)    Difficulty of Paying Living Expenses: Not hard at all  Food Insecurity: No Food Insecurity (02/03/2022)   Hunger Vital Sign    Worried About Running Out of Food in the Last Year: Never true    Friendly in the Last Year: Never true  Transportation Needs: No Transportation Needs (02/03/2022)   PRAPARE - Hydrologist (Medical): No    Lack of Transportation (Non-Medical): No  Physical Activity: Inactive (02/03/2022)   Exercise Vital Sign    Days of Exercise per Week: 0 days    Minutes of Exercise per Session: 0 min  Stress: No Stress Concern Present (02/03/2022)   [place]    Feeling of Stress : Not at all  Social Connections: Socially Isolated (02/03/2022)   Social Connection and Isolation Panel [NHANES]    Frequency of Communication with Friends and Family: Once a week    Frequency of Social Gatherings with Friends and Family: Never    Attends Religious Services: Never    Marine scientist or Organizations: No    Attends Music therapist: Never    Marital Status: Married     Family History:  The patient's family history includes Cancer - Other in his mother; Heart attack in his father; Uterine cancer in his sister.  ROS:   Please see the history of present illness.    ROS All other systems reviewed  and are negative.   PHYSICAL EXAM:   VS:  BP 112/60   Pulse 88   Ht 5' 8.6" (1.742 m)   Wt 193 lb 6.4 oz (87.7 kg)   SpO2 96%   BMI 28.89 kg/m     Affect appropriate Healthy:  appears stated age 64: normal Neck supple with no adenopathy JVP normal no bruits no thyromegaly Lungs clear with no wheezing and good diaphragmatic motion Heart:  S1/S2 no murmur, no rub, gallop or click PMI normal Abdomen: benighn, BS positve, no tenderness, no AAA no bruit.  No HSM or HJR Distal pulses intact with no bruits Plus 2 bilateral edema with stasis LLE worse than right  Neuro non-focal post lumbar surgery  Skin warm and dry Post R TKR     Wt Readings from Last 3 Encounters:  02/24/22 193 lb 6.4 oz (87.7 kg)  02/03/22 189 lb 8 oz (86 kg)  12/14/21 185 lb (83.9 kg)      Studies/Labs Reviewed:   EKG:    afib rate 80 IVCD   Recent Labs: 08/02/2021: TSH 2.00 09/30/2021: NT-Pro BNP 7,936 10/19/2021: ALT 14; BUN 67; Creatinine 2.99; Potassium 4.2; Sodium 140 11/22/2021: Hemoglobin 11.0; Platelets 166   Lipid Panel    Component Value Date/Time   CHOL 122 10/04/2021 1453   CHOL 166 01/15/2018 1516   TRIG 100.0 10/04/2021 1453   HDL 36.90 (L) 10/04/2021 1453   HDL 42 01/15/2018 1516   CHOLHDL 3 10/04/2021  1453   VLDL 20.0 10/04/2021 1453   LDLCALC 65 10/04/2021 1453   LDLCALC 86 01/15/2018 1516    Additional studies/ records that were reviewed today include:  AS above    ASSESSMENT & PLAN:     1. CAD s/p remote LAD stenting -  No chest pain. ETT 02/23/17 non ischemic continue ASA and beta blocker and statin   2. HTN- Well controlled.  Continue current medications and low sodium Dash type diet.    3. HLD-  Continue statin labs with primary   4. CKD stage III Cr 2.4-2.9 will update   5. Ortho:  Post right TKR continue PT/OT     6. Neuropathy:  Severe cervical disease with dense sensory deficit in both UE;s Post cervical decompression F/U [name] He has stopped Gabapentin on his Own as it made him feel poorly ? Related to amyloid But genetic testing negative so Amvuttra not a possibility  8. 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

## 2022-02-22 ENCOUNTER — Other Ambulatory Visit (HOSPITAL_COMMUNITY): Payer: Self-pay

## 2022-02-24 ENCOUNTER — Encounter: Payer: Self-pay | Admitting: Cardiovascular Disease

## 2022-02-24 ENCOUNTER — Ambulatory Visit: Payer: Medicare Other | Attending: Cardiovascular Disease | Admitting: Cardiovascular Disease

## 2022-02-24 VITALS — BP 112/60 | HR 88 | Ht 68.6 in | Wt 193.4 lb

## 2022-02-24 DIAGNOSIS — E782 Mixed hyperlipidemia: Secondary | ICD-10-CM

## 2022-02-24 DIAGNOSIS — I2510 Atherosclerotic heart disease of native coronary artery without angina pectoris: Secondary | ICD-10-CM | POA: Diagnosis not present

## 2022-02-24 DIAGNOSIS — E859 Amyloidosis, unspecified: Secondary | ICD-10-CM | POA: Diagnosis not present

## 2022-02-24 DIAGNOSIS — R609 Edema, unspecified: Secondary | ICD-10-CM | POA: Insufficient documentation

## 2022-02-24 DIAGNOSIS — I480 Paroxysmal atrial fibrillation: Secondary | ICD-10-CM

## 2022-02-24 DIAGNOSIS — R600 Localized edema: Secondary | ICD-10-CM

## 2022-02-24 DIAGNOSIS — E8589 Other amyloidosis: Secondary | ICD-10-CM | POA: Diagnosis not present

## 2022-02-24 NOTE — Patient Instructions (Addendum)
Medication Instructions:  Your physician recommends that you continue on your current medications as directed. Please refer to the Current Medication list given to you today.  *If you need a refill on your cardiac medications before your next appointment, please call your pharmacy*  Lab Work: If you have labs (blood work) drawn today and your tests are completely normal, you will receive your results only by: [place] (if you have MyChart) OR A paper copy in the mail If you have any lab test that is abnormal or we need to change your treatment, we will call you to review the results.  Testing/Procedures: None ordered today.  Follow-Up: At [facility], you and your health needs are our priority.  As part of our continuing mission to provide you with exceptional heart care, we have created designated Provider Care Teams.  These Care Teams include your primary Cardiologist (physician) and Advanced Practice Providers (APPs -  Physician Assistants and Nurse Practitioners) who all work together to provide you with the care you need, when you need it.  We recommend signing up for the patient portal called "MyChart".  Sign up information is provided on this After Visit Summary.  MyChart is used to connect with patients for Virtual Visits (Telemedicine).  Patients are able to view lab/test results, encounter notes, upcoming appointments, etc.  Non-urgent messages can be sent to your provider as well.   To learn more about what you can do with MyChart, go to [email or web address].    Your next appointment:   6 month(s)  The format for your next appointment:   In Person  Provider:   Jenkins Rouge, MD     Your physician recommends that you schedule a follow-up appointment in: March with Dr. Haroldine Laws.  Important Information About Sugar

## 2022-03-02 ENCOUNTER — Other Ambulatory Visit (HOSPITAL_COMMUNITY): Payer: Self-pay

## 2022-03-05 ENCOUNTER — Other Ambulatory Visit: Payer: Self-pay | Admitting: Cardiovascular Disease

## 2022-03-07 DIAGNOSIS — R309 Painful micturition, unspecified: Secondary | ICD-10-CM | POA: Diagnosis not present

## 2022-03-07 DIAGNOSIS — N184 Chronic kidney disease, stage 4 (severe): Secondary | ICD-10-CM | POA: Diagnosis not present

## 2022-03-07 DIAGNOSIS — E854 Organ-limited amyloidosis: Secondary | ICD-10-CM | POA: Diagnosis not present

## 2022-03-07 DIAGNOSIS — I480 Paroxysmal atrial fibrillation: Secondary | ICD-10-CM | POA: Diagnosis not present

## 2022-03-07 DIAGNOSIS — N189 Chronic kidney disease, unspecified: Secondary | ICD-10-CM | POA: Diagnosis not present

## 2022-03-07 DIAGNOSIS — E785 Hyperlipidemia, unspecified: Secondary | ICD-10-CM | POA: Diagnosis not present

## 2022-03-07 DIAGNOSIS — R809 Proteinuria, unspecified: Secondary | ICD-10-CM | POA: Diagnosis not present

## 2022-03-07 DIAGNOSIS — E559 Vitamin D deficiency, unspecified: Secondary | ICD-10-CM | POA: Diagnosis not present

## 2022-03-07 DIAGNOSIS — E039 Hypothyroidism, unspecified: Secondary | ICD-10-CM | POA: Diagnosis not present

## 2022-03-07 DIAGNOSIS — I509 Heart failure, unspecified: Secondary | ICD-10-CM | POA: Diagnosis not present

## 2022-03-07 DIAGNOSIS — I10 Essential (primary) hypertension: Secondary | ICD-10-CM | POA: Diagnosis not present

## 2022-03-07 DIAGNOSIS — D649 Anemia, unspecified: Secondary | ICD-10-CM | POA: Diagnosis not present

## 2022-03-07 DIAGNOSIS — E211 Secondary hyperparathyroidism, not elsewhere classified: Secondary | ICD-10-CM | POA: Diagnosis not present

## 2022-03-27 ENCOUNTER — Other Ambulatory Visit (HOSPITAL_COMMUNITY): Payer: Self-pay

## 2022-03-28 ENCOUNTER — Other Ambulatory Visit: Payer: Self-pay

## 2022-04-14 DIAGNOSIS — Z23 Encounter for immunization: Secondary | ICD-10-CM | POA: Diagnosis not present

## 2022-04-20 ENCOUNTER — Other Ambulatory Visit (HOSPITAL_COMMUNITY): Payer: Self-pay

## 2022-04-20 DIAGNOSIS — I509 Heart failure, unspecified: Secondary | ICD-10-CM | POA: Diagnosis not present

## 2022-04-20 DIAGNOSIS — E785 Hyperlipidemia, unspecified: Secondary | ICD-10-CM | POA: Diagnosis not present

## 2022-04-20 DIAGNOSIS — R809 Proteinuria, unspecified: Secondary | ICD-10-CM | POA: Diagnosis not present

## 2022-04-20 DIAGNOSIS — E854 Organ-limited amyloidosis: Secondary | ICD-10-CM | POA: Diagnosis not present

## 2022-04-20 DIAGNOSIS — R300 Dysuria: Secondary | ICD-10-CM | POA: Diagnosis not present

## 2022-04-20 DIAGNOSIS — I4891 Unspecified atrial fibrillation: Secondary | ICD-10-CM | POA: Diagnosis not present

## 2022-04-20 DIAGNOSIS — D649 Anemia, unspecified: Secondary | ICD-10-CM | POA: Diagnosis not present

## 2022-04-20 DIAGNOSIS — N184 Chronic kidney disease, stage 4 (severe): Secondary | ICD-10-CM | POA: Diagnosis not present

## 2022-04-20 DIAGNOSIS — I10 Essential (primary) hypertension: Secondary | ICD-10-CM | POA: Diagnosis not present

## 2022-04-25 ENCOUNTER — Other Ambulatory Visit (HOSPITAL_COMMUNITY): Payer: Self-pay

## 2022-04-25 ENCOUNTER — Other Ambulatory Visit: Payer: Self-pay

## 2022-04-30 NOTE — Progress Notes (Unsigned)
Electrophysiology Office Follow up Visit Note:    Date:  04/30/2022   ID:  Gregory Guerrero, DOB 1945-08-20, MRN [id number]  PCP:  Colon Branch, MD  [facility] Cardiologist:  Jenkins Rouge, MD  [place] Electrophysiologist:  Vickie Epley, MD    Interval History:    Gregory Guerrero is a 77 y.o. male who presents for a follow up visit.  I last saw the patient September 21, 2021 for his persistent atrial fibrillation.  We discussed ablation and watchman. He was recently diagnosed with cardiac amyloidosis.  This occurred after my original consultation.  After his diagnosis of cardiac amyloidosis, ablation and watchman were both canceled given known poor response to ablation in this patient population.    Past Medical History:  Diagnosis Date   Arthritis    CAD (coronary artery disease)    Chronic kidney disease    DVT (deep venous thrombosis) ([facility])    following spinal surgery Nov 2017   HTN (hypertension)    MI (myocardial infarction) ([place])    1999 ; stents placed    Numbness and tingling in both hands    Spondylosis of cervical spine     Past Surgical History:  Procedure Laterality Date   ANTERIOR CERVICAL DECOMP/DISCECTOMY FUSION N/A 06/27/2017   Procedure: ANTERIOR CERVICAL DECOMPRESSION/DISCECTOMY FUSION C3-4;  Surgeon: [name], MD;  Location: [place];  Service: Orthopedics;  Laterality: N/A;  3 hrs   ANTERIOR CERVICAL DECOMP/DISCECTOMY FUSION  01/24/2019   ANTERIOR LAT LUMBAR FUSION N/A 03/01/2016   Procedure: EXTREME LATERAL INTERBODY FUSION  LUMBAR 2-4;  Surgeon: [name], MD;  Location: [place];  Service: Orthopedics;  Laterality: N/A;   CARDIAC CATHETERIZATION     20 yrs ago   CARDIOVERSION N/A 07/27/2020   Procedure: CARDIOVERSION;  Surgeon: [name], MD;  Location: [place];  Service: Cardiovascular;  Laterality: N/A;   CARDIOVERSION N/A 08/05/2021   Procedure: CARDIOVERSION;  Surgeon: [name], MD;  Location: [place];   Service: Cardiovascular;  Laterality: N/A;   JOINT REPLACEMENT     NECK HARDWARE REMOVAL  12/11/2018   RADIOLOGY WITH ANESTHESIA N/A 05/15/2017   Procedure: MRI WITH ANESTHESIA CERVICAL SPINE WITH AND WITHOUT;  Surgeon: Radiologist, Medication, MD;  Location: [place];  Service: Radiology;  Laterality: N/A;   RIGHT HEART CATH N/A 09/28/2021   Procedure: RIGHT HEART CATH;  Surgeon: Jolaine Artist, MD;  Location: [place] [facility];  Service: Cardiovascular;  Laterality: N/A;   SPINAL FUSION N/A 03/02/2016   Procedure: POSTERIOR SPINAL FUSION INTERBODY L2-S1, DECOMPRESSION L4-S1;  Surgeon: [name], MD;  Location: [place];  Service: Orthopedics;  Laterality: N/A;   TOTAL KNEE ARTHROPLASTY Right 04/30/2017   Procedure: RIGHT TOTAL KNEE ARTHROPLASTY;  Surgeon: [name], MD;  [facility];  Service: Orthopedics;  Laterality: Right;    Current Medications: No outpatient medications have been marked as taking for the 05/01/22 encounter (Appointment) with Vickie Epley, MD.     Allergies:   Patient has no known allergies.   Social History   Socioeconomic History   Marital status: Married    Spouse name: Not on file   Number of children: 2   Years of education: Not on file   Highest education level: Not on file  Occupational History   Occupation: retired , Pension scheme manager busines   Tobacco Use   Smoking status: Former    Types: Cigarettes    Quit date: 04/26/1983    Years since  quitting: 39.0   Smokeless tobacco: Never   Tobacco comments:    quit over 40 years ago   Vaping Use   Vaping Use: Never used  Substance and Sexual Activity   Alcohol use: No   Drug use: No   Sexual activity: Never  Other Topics Concern   Not on file  Social History Narrative   Household pt, wife    Gregory Guerrero Determinants of Health   Financial Resource Strain: Low Risk  (02/03/2022)   Overall Financial Resource Strain (CARDIA)    Difficulty of Paying Living Expenses: Not  hard at all  Food Insecurity: No Food Insecurity (02/03/2022)   Hunger Vital Sign    Worried About Running Out of Food in the Last Year: Never true    Ran Out of Food in the Last Year: Never true  Transportation Needs: No Transportation Needs (02/03/2022)   PRAPARE - Hydrologist (Medical): No    Lack of Transportation (Non-Medical): No  Physical Activity: Inactive (02/03/2022)   Exercise Vital Sign    Days of Exercise per Week: 0 days    Minutes of Exercise per Session: 0 min  Stress: No Stress Concern Present (02/03/2022)   [place]    Feeling of Stress : Not at all  Social Connections: Socially Isolated (02/03/2022)   Social Connection and Isolation Panel [NHANES]    Frequency of Communication with Friends and Family: Once a week    Frequency of Social Gatherings with Friends and Family: Never    Attends Religious Services: Never    Marine scientist or Organizations: No    Attends Music therapist: Never    Marital Status: Married     Family History: The patient's family history includes Cancer - Other in his mother; Heart attack in his father; Uterine cancer in his sister. There is no history of Colon cancer or Prostate cancer.  ROS:   Please see the history of present illness.    All other systems reviewed and are negative.  EKGs/Labs/Other Studies Reviewed:    The following studies were reviewed today:    Recent Labs: 08/02/2021: TSH 2.00 09/30/2021: NT-Pro BNP 7,936 10/19/2021: ALT 14; BUN 67; Creatinine 2.99; Potassium 4.2; Sodium 140 11/22/2021: Hemoglobin 11.0; Platelets 166  Recent Lipid Panel    Component Value Date/Time   CHOL 122 10/04/2021 1453   CHOL 166 01/15/2018 1516   TRIG 100.0 10/04/2021 1453   HDL 36.90 (L) 10/04/2021 1453   HDL 42 01/15/2018 1516   CHOLHDL 3 10/04/2021 1453   VLDL 20.0 10/04/2021 1453   LDLCALC 65 10/04/2021 1453    LDLCALC 86 01/15/2018 1516    Physical Exam:    VS:  There were no vitals taken for this visit.    Wt Readings from Last 3 Encounters:  02/24/22 193 lb 6.4 oz (87.7 kg)  02/03/22 189 lb 8 oz (86 kg)  12/14/21 185 lb (83.9 kg)     GEN: *** Well nourished, well developed in no acute distress CARDIAC: ***RRR, no murmurs, rubs, gallops RESPIRATORY:  Clear to auscultation without rales, wheezing or rhonchi  PSYCHIATRIC:  Normal affect        ASSESSMENT:    No diagnosis found. PLAN:    In order of problems listed above:   #Persistent atrial fibrillation May be permanent.  Rhythm control will be difficult.  Given his diagnosis of cardiac  amyloidosis, ablation is not an option for him.  He will continue on his Pradaxa for stroke prophylaxis.  Rate control with amiodarone.  #Amiodarone monitoring Liver enzymes normal in July 2023.  TSH normal in April 2023. Repeat CMP, TSH and free T4 today.  #Cardiac amyloidosis Management per Dr. Johnsie Cancel and [place].  Continue tafamidis for now.  Follow-up 6 months with APP for amiodarone monitoring.    Medication Adjustments/Labs and Tests Ordered: Current medicines are reviewed at length with the patient today.  Concerns regarding medicines are outlined above.  No orders of the defined types were placed in this encounter.  No orders of the defined types were placed in this encounter.    Signed, Lars Mage, MD, [facility], [facility] 04/30/2022 7:49 PM    Electrophysiology [facility]

## 2022-05-01 ENCOUNTER — Ambulatory Visit: Payer: Medicare Other | Attending: Cardiology | Admitting: Cardiology

## 2022-05-01 ENCOUNTER — Telehealth (HOSPITAL_COMMUNITY): Payer: Self-pay | Admitting: Cardiology

## 2022-05-01 ENCOUNTER — Encounter: Payer: Self-pay | Admitting: Cardiology

## 2022-05-01 VITALS — BP 134/74 | HR 91 | Ht 68.6 in | Wt 200.0 lb

## 2022-05-01 DIAGNOSIS — E8589 Other amyloidosis: Secondary | ICD-10-CM

## 2022-05-01 DIAGNOSIS — I4819 Other persistent atrial fibrillation: Secondary | ICD-10-CM | POA: Insufficient documentation

## 2022-05-01 DIAGNOSIS — Z79899 Other long term (current) drug therapy: Secondary | ICD-10-CM | POA: Insufficient documentation

## 2022-05-01 NOTE — Patient Instructions (Addendum)
Medication Instructions:  Your physician has recommended you make the following change in your medication:  INCREASE Torsemide to 20 mg TWICE a day for the next 2 days, then return to normal dosing  *If you need a refill on your cardiac medications before your next appointment, please call your pharmacy*   Lab Work: None ordered  Testing/Procedures: None ordered   Follow-Up: At [facility], you and your health needs are our priority.  As part of our continuing mission to provide you with exceptional heart care, we have created designated Provider Care Teams.  These Care Teams include your primary Cardiologist (physician) and Advanced Practice Providers (APPs -  Physician Assistants and Nurse Practitioners) who all work together to provide you with the care you need, when you need it.  Your next appointment:   6 month(s)  The format for your next appointment:   In Person  Provider:   You will see one of the following Advanced Practice Providers on your designated Care Team:   Tommye Standard, [place] Legrand Como "Jonni Sanger" Chalmers Cater, [place]   Thank you for choosing [facility]!!   [phone]  Other Instructions

## 2022-05-01 NOTE — Telephone Encounter (Signed)
Add on appt 05/17/22 @ 330 Pt aware

## 2022-05-01 NOTE — Progress Notes (Signed)
Electrophysiology Office Follow up Visit Note:    Date:  05/01/2022   ID:  Gregory Guerrero, DOB June 02, 1945, MRN [id number]  PCP:  Colon Branch, MD  [facility] Cardiologist:  Jenkins Rouge, MD  [place] Electrophysiologist:  Vickie Epley, MD    Interval History:    Gregory Guerrero is a 77 y.o. male who presents for a follow up visit.  I last saw the patient September 21, 2021 for his persistent atrial fibrillation.  We discussed ablation and watchman. He was recently diagnosed with cardiac amyloidosis.  This occurred after my original consultation.  After his diagnosis of cardiac amyloidosis, ablation and watchman were both canceled given known poor response to ablation in this patient population.  Today, he is accompanied by his wife. Since his last visit with me, he has been recently diagnosed with Cardiac amyloid.  He describes a few palpitation episodes, but reports doing well overall. He continues to be in resistant A fib as revealed by his recent Echo and recent MRI.  His pulse in clinic today is 91 bpm. He reports a urine test from 1-2 weeks ago showed a creatine result of .172.  He is tolerating his Tafamidis 61 MG. He takes is about 1 pm. He has discontinued metoprolol and amiodarone. He discontinued metoprolol due to swelling concerns. He has been off of amiodarone for a few months.   He complains of SOB especially in the evening. He also has urinary frequency, needing to use the bathroom every 2-3 hours at night. He also has bilateral lower extremity edema. His gastroenteralgias had previously recommended that he frequently stays hydrated. He previously had constipation issues at that time.  He denies any chest pain, lightheadedness, headaches, syncope, orthopnea, or PND.    Past Medical History:  Diagnosis Date   Arthritis    CAD (coronary artery disease)    Chronic kidney disease    DVT (deep venous thrombosis) (HCC)    following spinal surgery Nov 2017   HTN  (hypertension)    MI (myocardial infarction) ([place])    1999 ; stents placed    Numbness and tingling in both hands    Spondylosis of cervical spine     Past Surgical History:  Procedure Laterality Date   ANTERIOR CERVICAL DECOMP/DISCECTOMY FUSION N/A 06/27/2017   Procedure: ANTERIOR CERVICAL DECOMPRESSION/DISCECTOMY FUSION C3-4;  Surgeon: [name], MD;  Location: [place];  Service: Orthopedics;  Laterality: N/A;  3 hrs   ANTERIOR CERVICAL DECOMP/DISCECTOMY FUSION  01/24/2019   ANTERIOR LAT LUMBAR FUSION N/A 03/01/2016   Procedure: EXTREME LATERAL INTERBODY FUSION  LUMBAR 2-4;  Surgeon: [name], MD;  Location: [place];  Service: Orthopedics;  Laterality: N/A;   CARDIAC CATHETERIZATION     20 yrs ago   CARDIOVERSION N/A 07/27/2020   Procedure: CARDIOVERSION;  Surgeon: [name], MD;  Location: [place];  Service: Cardiovascular;  Laterality: N/A;   CARDIOVERSION N/A 08/05/2021   Procedure: CARDIOVERSION;  Surgeon: [name], MD;  Location: [place];  Service: Cardiovascular;  Laterality: N/A;   JOINT REPLACEMENT     NECK HARDWARE REMOVAL  12/11/2018   RADIOLOGY WITH ANESTHESIA N/A 05/15/2017   Procedure: MRI WITH ANESTHESIA CERVICAL SPINE WITH AND WITHOUT;  Surgeon: Radiologist, Medication, MD;  Location: [place];  Service: Radiology;  Laterality: N/A;   RIGHT HEART CATH N/A 09/28/2021   Procedure: RIGHT HEART CATH;  Surgeon: Jolaine Artist, MD;  Location: [place] [facility];  Service: Cardiovascular;  Laterality:  N/A;   SPINAL FUSION N/A 03/02/2016   Procedure: POSTERIOR SPINAL FUSION INTERBODY L2-S1, DECOMPRESSION L4-S1;  Surgeon: [name], MD;  Location: [place];  Service: Orthopedics;  Laterality: N/A;   TOTAL KNEE ARTHROPLASTY Right 04/30/2017   Procedure: RIGHT TOTAL KNEE ARTHROPLASTY;  Surgeon: [name], MD;  Location: [facility];  Service: Orthopedics;  Laterality: Right;    Current Medications: Current Meds  Medication Sig    acetaminophen (TYLENOL) 650 MG CR tablet Take 650 mg by mouth every 8 (eight) hours as needed for pain.   Ascorbic Acid (VITAMIN C) 1000 MG tablet Take 1,000 mg by mouth daily.   aspirin EC 81 MG tablet Take 81 mg by mouth at bedtime.   atorvastatin (LIPITOR) 80 MG tablet TAKE 1 TABLET('80MG'$ ) BY MOUTH EVERY OTHER DAY AND TAKE 1/2 TABLET BY MOUTH ON ALTERNATE DAYS   dabigatran (PRADAXA) 150 MG CAPS capsule Take 1 capsule (150 mg total) by mouth 2 (two) times daily.   Multiple Vitamin (MULTIVITAMIN WITH MINERALS) TABS tablet Take 1 tablet by mouth in the morning.   Tafamidis (VYNDAMAX) 61 MG CAPS Take 1 capsule by mouth daily.   torsemide (DEMADEX) 20 MG tablet Take 20 mg by mouth daily.     Allergies:   Patient has no known allergies.   Social History   Socioeconomic History   Marital status: Married    Spouse name: Not on file   Number of children: 2   Years of education: Not on file   Highest education level: Not on file  Occupational History   Occupation: retired , Pension scheme manager busines   Tobacco Use   Smoking status: Former    Types: Cigarettes    Quit date: 04/26/1983    Years since quitting: 39.0   Smokeless tobacco: Never   Tobacco comments:    quit over 40 years ago   Vaping Use   Vaping Use: Never used  Substance and Sexual Activity   Alcohol use: No   Drug use: No   Sexual activity: Never  Other Topics Concern   Not on file  Social History Narrative   Household pt, wife    [place] from [place] Determinants of Health   Financial Resource Strain: [place]  (02/03/2022)   Overall Financial Resource Strain (CARDIA)    Difficulty of Paying Living Expenses: Not hard at all  Food Insecurity: No Food Insecurity (02/03/2022)   Hunger Vital Sign    Worried About Running Out of Food in the Last Year: Never true    [place] in the Last Year: Never true  Transportation Needs: No Transportation Needs (02/03/2022)   PRAPARE - Radiographer, therapeutic (Medical): No    Lack of Transportation (Non-Medical): No  Physical Activity: Inactive (02/03/2022)   Exercise Vital Sign    Days of Exercise per Week: 0 days    Minutes of Exercise per Session: 0 min  Stress: No Stress Concern Present (02/03/2022)   [place]    Feeling of Stress : Not at all  Social Connections: Socially Isolated (02/03/2022)   Social Connection and Isolation Panel [NHANES]    Frequency of Communication with Friends and Family: Once a week    Frequency of Social Gatherings with Friends and Family: Never    Attends Religious Services: Never    Marine scientist or Organizations: No    Attends Archivist Meetings: Never  Marital Status: Married     Family History: The patient's family history includes Cancer - Other in his mother; Heart attack in his father; Uterine cancer in his sister. There is no history of Colon cancer or Prostate cancer.  ROS:   Please see the history of present illness.    (+) SOB (+) constipation (+) bilateral lower extremity edema (+) urinary frequency All other systems reviewed and are negative.  EKGs/Labs/Other Studies Reviewed:    The following studies were reviewed today:  EKG: EKG is personally reviewed. 05/01/2022: Atrial flutter with variable AV block.  Ventricular rate 91 bpm.  Recent Labs: 08/02/2021: TSH 2.00 09/30/2021: NT-Pro BNP 7,936 10/19/2021: ALT 14; BUN 67; Creatinine 2.99; Potassium 4.2; Sodium 140 11/22/2021: Hemoglobin 11.0; Platelets 166   Recent Lipid Panel    Component Value Date/Time   CHOL 122 10/04/2021 1453   CHOL 166 01/15/2018 1516   TRIG 100.0 10/04/2021 1453   HDL 36.90 (L) 10/04/2021 1453   HDL 42 01/15/2018 1516   CHOLHDL 3 10/04/2021 1453   VLDL 20.0 10/04/2021 1453   LDLCALC 65 10/04/2021 1453   LDLCALC 86 01/15/2018 1516    Physical Exam:    VS:  BP 134/74   Pulse 91   Ht 5' 8.6" (1.742  m)   Wt 200 lb (90.7 kg)   SpO2 98%   BMI 29.88 kg/m     Wt Readings from Last 3 Encounters:  05/01/22 200 lb (90.7 kg)  02/24/22 193 lb 6.4 oz (87.7 kg)  02/03/22 189 lb 8 oz (86 kg)     GEN:  Well nourished, well developed in no acute distress.  CARDIAC: Irregularly irregular, no murmurs, rubs, gallops.2+ pitting edema to knee bilaterally RESPIRATORY:  Clear to auscultation without rales, wheezing or rhonchi  PSYCHIATRIC:  Normal affect        ASSESSMENT:    1. Persistent atrial fibrillation ([place])   2. Amyloidogenic transthyretin amyloidosis (HCC)   3. Encounter for long-term (current) use of high-risk medication    PLAN:    In order of problems listed above:   # Atrial fibrillation Suspect he may be in permanent atrial fibrillation and flutter given the behavior of his arrhythmia after cardioversion despite treatment with amiodarone.  I will transition her efforts to rate control at this time.  He was taken off his amiodarone by Dr. Johnsie Cancel which is reasonable for the time being but he may have to be restarted for rate control in the future.  He will continue Pradaxa for stroke risk mitigation.  Given his diagnosis of cardiac amyloidosis and kidney disease, I do not think he is a good candidate for watchman.  #Chronic diastolic heart failure EF preserved but with a diagnosis of cardiac amyloidosis.  NYHA III.  He is volume overloaded on exam today.  I will have him increase his torsemide to twice daily for the next 2 days.    #Cardiac amyloidosis Management per Dr. Johnsie Cancel and [place].  Continue tafamidis for now.  Follow-up: 6 months with APP for check-in regarding rate control  Medication Adjustments/Labs and Tests Ordered: Current medicines are reviewed at length with the patient today.  Concerns regarding medicines are outlined above.  No orders of the defined types were placed in this encounter.  No orders of the defined types were placed in this  encounter.   I,Mitra Faeizi,acting as a Education administrator for Vickie Epley, MD.,have documented all relevant documentation on the behalf of Vickie Epley, MD,as directed by  [facility]  Pryor Curia, MD while in the presence of Vickie Epley, MD.  I, Vickie Epley, MD, have reviewed all documentation for this visit. The documentation on 05/01/22 for the exam, diagnosis, procedures, and orders are all accurate and complete.   Signed, Lars Mage, MD, [facility], [facility] 05/01/2022 1:46 PM    Electrophysiology [facility]

## 2022-05-01 NOTE — Telephone Encounter (Signed)
-----  Message from Jolaine Artist, MD sent at 05/01/2022  3:51 PM EST ----- Thanks  Elohim Brune - any way to find him a spot for me or the PA/NP next week?  Thanks   ----- Message ----- From: Vickie Epley, MD Sent: 05/01/2022   1:49 PM EST To: Jolaine Artist, MD; [name], MD  Madalyn Rob and Linna Hoff, I don't think he's a good candidate for PVI given his inability to sustain normal rhythm despite treatment with amiodarone. His dx of cardiac amyloid also significantly reduces success rates of ablation. I have recommended rate control.  He had a bunch of questions re: prognosis, etc today. I think he has an appointmetn with Dan/HF in March--any chance of getting this moved up to February if possible.   He was significantly volume overloaded so I increased his torsemide to BID for a couple days. He has bad kidneys which complicates everything.  Thx, CL

## 2022-05-15 NOTE — Progress Notes (Addendum)
ADVANCED HF CLINIC NOTE  Primary Care: Colon Branch, MD Primary Cardiologist: Jenkins Rouge, MD Nephrologist: Dr. Willene Hatchet [facility]) HF Cardiologist: Dr. Haroldine Laws   HPI: 77 y.o.male with CAD s/p remote stenting of LAD in [place], previous DVT, HTN, CKD IV, PAF and diastolic HF. REferred by Dr. 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   Has been followed by Dr. Johnsie Cancel. Brief cardiac history includes:   Echo 02/01/17 EF 66% grade 2 diastolic mild MR  ETT 29/4/76 normal    Noted new onset afib 07/27/20/ Started on renal dose xarelto 15 mg daily. -> DCCv 07/27/20 with conversion to NSR    In 07/22/21 recurrent AF/AFL  -> Had successful repeat [facility] on 08/05/21 Done on amiodarone 200 mg daily and pradaxa 150 mg bi.  Following with Dr. [name] for possible Watchman  Referred to me for RHC in 6/23 due to LE edema. RHC with prominent v-waves in PCWP tracing suspicious for MR vs severe diastolic dysfunction. TTE with mild MR. 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   RHC 09/28/21   RA = 7 RV = 48/8 PA = 52/16 (34) PCW = 22 (v = 40) Fick cardiac output/index = 4.1/2.0 PVR = 3.2 FA sat = 97% PA sat = 61%, 58%  Echo 6/23 EF 60% strain with cherry on top. RV low normal Mild MR  PYP 10/13/21  markedly positive    cMRI 10/20/21: EF 54% RV 51% Diffuse LGE with ECV 50%  Saw Dr. Marin Olp Serum light chains mildly elevated. Urine IFE normal . Felt not to have light chain amyloidosis. BMBx pending for confirmation .  First saw Dr. Haroldine Laws 7/23, starting Vyndamax. NYHA II and volume OK. Having some orthostatic symptoms but BP ok. Beta blocker stopped with bradycardia.  Follow up with Dr. [name] 1/24, felt not a candidate for PVI and recommended rate control strategy. He was volume overloaded and instructed to increase torsemide to 20 bid x 2 days.  Today he returns for an acute visit with his wife with complaints of feeling more SOB, but says it is "all in my head." Wife notices  gurgling when he is sleeping and + PND. Feet swell. He is SOB walking up steps and on flat ground. Walks his dog up his steep driveway every morning and now has to stop 3 x to catch his breath (previously could do this stopping just once or twice). Denies abnormal bleeding, CP, dizziness, palpitations, or orthopnea. Appetite ok. No fever or chills. Weight at home 193 pounds. Taking all medications.  Sees nephrology in [place].  Past Medical History:  Diagnosis Date   Arthritis    CAD (coronary artery disease)    Chronic kidney disease    DVT (deep venous thrombosis) (HCC)    following spinal surgery Nov 2017   HTN (hypertension)    MI (myocardial infarction) ([place])    1999 ; stents placed    Numbness and tingling in both hands    Spondylosis of cervical spine    Current Outpatient Medications  Medication Sig Dispense Refill   acetaminophen (TYLENOL) 650 MG CR tablet Take 650 mg by mouth every 8 (eight) hours as needed for pain.     Ascorbic Acid (VITAMIN C) 1000 MG tablet Take 1,000 mg by mouth daily.     aspirin EC 81 MG tablet Take 81 mg by mouth at bedtime.     atorvastatin (LIPITOR) 80 MG tablet TAKE 1 TABLET('80MG'$ ) BY MOUTH EVERY OTHER DAY AND TAKE 1/2 TABLET BY MOUTH  ON ALTERNATE DAYS 90 tablet 3   dabigatran (PRADAXA) 150 MG CAPS capsule Take 1 capsule (150 mg total) by mouth 2 (two) times daily. 180 capsule 1   Multiple Vitamin (MULTIVITAMIN WITH MINERALS) TABS tablet Take 1 tablet by mouth in the morning.     Tafamidis (VYNDAMAX) 61 MG CAPS Take 1 capsule by mouth daily. 30 capsule 11   torsemide (DEMADEX) 20 MG tablet Take 20 mg by mouth daily.     No current facility-administered medications for this encounter.   No Known Allergies  Social History   Socioeconomic History   Marital status: Married    Spouse name: Not on file   Number of children: 2   Years of education: Not on file   Highest education level: Not on file  Occupational History   Occupation: retired , Pension scheme manager  busines   Tobacco Use   Smoking status: Former    Types: Cigarettes    Quit date: 04/26/1983    Years since quitting: 39.0   Smokeless tobacco: Never   Tobacco comments:    quit over 40 years ago   Vaping Use   Vaping Use: Never used  Substance and Sexual Activity   Alcohol use: No   Drug use: No   Sexual activity: Never  Other Topics Concern   Not on file  Social History Narrative   Household pt, wife    [place] from [place] Determinants of Health   Financial Resource Strain: [place]  (02/03/2022)   Overall Financial Resource Strain (CARDIA)    Difficulty of Paying Living Expenses: Not hard at all  Food Insecurity: No Food Insecurity (02/03/2022)   Hunger Vital Sign    Worried About Running Out of Food in the Last Year: Never true    [place] in the Last Year: Never true  Transportation Needs: No Transportation Needs (02/03/2022)   PRAPARE - Hydrologist (Medical): No    Lack of Transportation (Non-Medical): No  Physical Activity: Inactive (02/03/2022)   Exercise Vital Sign    Days of Exercise per Week: 0 days    Minutes of Exercise per Session: 0 min  Stress: No Stress Concern Present (02/03/2022)   [place]    Feeling of Stress : Not at all  Social Connections: Socially Isolated (02/03/2022)   Social Connection and Isolation Panel [NHANES]    Frequency of Communication with Friends and Family: Once a week    Frequency of Social Gatherings with Friends and Family: Never    Attends Religious Services: Never    Marine scientist or Organizations: No    Attends Archivist Meetings: Never    Marital Status: Married  Human resources officer Violence: Not At Risk (02/03/2022)   Humiliation, Afraid, Rape, and Kick questionnaire    Fear of Current or Ex-Partner: No    Emotionally Abused: No    Physically Abused: No    Sexually Abused: No   Family  History  Problem Relation Age of Onset   Cancer - Other Mother    Heart attack Father    Uterine cancer Sister    Colon cancer Neg Hx    Prostate cancer Neg Hx    BP (!) 140/90   Pulse (!) 105   Wt 91.6 kg (202 lb)   SpO2 99%   BMI 30.18 kg/m   Wt Readings from Last 3 Encounters:  05/17/22  91.6 kg (202 lb)  05/01/22 90.7 kg (200 lb)  02/24/22 87.7 kg (193 lb 6.4 oz)   PHYSICAL EXAM: General:  NAD. No resp difficulty HEENT: Normal Neck: Supple. JVD 8-9. Carotids 2+ bilat; no bruits. No lymphadenopathy or thryomegaly appreciated. Cor: PMI nondisplaced. Irregular rate & rhythm. No rubs, gallops or murmurs. Lungs: Clear Abdomen: Soft, nontender, nondistended. No hepatosplenomegaly. No bruits or masses. Good bowel sounds. Extremities: No cyanosis, clubbing, rash, 2+ BLE edema to knees Neuro: Alert & oriented x 3, cranial nerves grossly intact. Moves all 4 extremities w/o difficulty. Affect pleasant.  ECG (personally reviewed): AF 87 bpm  ReDs: 37%  ASSESSMENT & PLAN:  1. 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 RV low normal Mild MR - PYP 10/13/21  markedly positive  - cMRI 10/20/21: EF 54%, RV 51% Diffuse LGE with ECV 50% - Now on Vyndamax. - Saw Dr. Marin Olp, Serum light chains mildly elevated. Urine IFE normal. Felt not to have light chain amyloidosis. BMBx pending for confirmation. - Dr. Haroldine Laws had a long discussion about cardiac amyloid diagnosis and difference between TTR and AL amyloid - [facility] genetic testing negative for TTR. - Worse NYHA III-IIIb. Volume status up, weight up 10 lbs from last visit. REDs 37% - Increase torsemide to 40 mg daily, given Rx for 10 KCL to have PRN. - Consider Jardiance 10 mg daily. We discussed rationale for this. Will discuss with his nephrologist. - Reports orthostatic symptoms,  can consider midodrine as needed. - Place compression hose. - BMET/BNP today, repeat in 7-10 days (Rx given for [facility]).  2. PAF - Off beta blocker due to bradycardia, amiodarone stopped 11/23 due to lack of efficacy but may need to add back for rate control. - EP felt not to be Watchman or ablation candidate, pursue rate control strategy. - Remains in AF, pursuing rate control. HR 87 today.  - Continue Pradaxa. No bleeding issues.  3. CKD IV - SCr 2.5-3.0 - Follows with Nephrology in [place] - As above, will discuss starting SGLT2i with his nephrologist.  Keep follow up 06/28/22 with APP.  Rafael Bihari, FNP  3:46 PM  Addendum: I personally called Gregory Guerrero's nephrologist, Dr. Willene Hatchet. He is agreeable to starting SGLT2i

## 2022-05-17 ENCOUNTER — Encounter (HOSPITAL_COMMUNITY): Payer: Self-pay

## 2022-05-17 ENCOUNTER — Other Ambulatory Visit (HOSPITAL_COMMUNITY): Payer: Self-pay

## 2022-05-17 ENCOUNTER — Ambulatory Visit (HOSPITAL_COMMUNITY)
Admission: RE | Admit: 2022-05-17 | Discharge: 2022-05-17 | Disposition: A | Discharge status: Home / Self Care | Payer: Medicare Other | Source: Ambulatory Visit | Attending: Family Medicine | Admitting: Family Medicine

## 2022-05-17 VITALS — BP 140/90 | HR 105 | Wt 202.0 lb

## 2022-05-17 DIAGNOSIS — I4819 Other persistent atrial fibrillation: Secondary | ICD-10-CM | POA: Diagnosis not present

## 2022-05-17 DIAGNOSIS — I5032 Chronic diastolic (congestive) heart failure: Secondary | ICD-10-CM | POA: Insufficient documentation

## 2022-05-17 DIAGNOSIS — I2510 Atherosclerotic heart disease of native coronary artery without angina pectoris: Secondary | ICD-10-CM | POA: Diagnosis not present

## 2022-05-17 DIAGNOSIS — I130 Hypertensive heart and chronic kidney disease with heart failure and stage 1 through stage 4 chronic kidney disease, or unspecified chronic kidney disease: Secondary | ICD-10-CM | POA: Insufficient documentation

## 2022-05-17 DIAGNOSIS — Z79899 Other long term (current) drug therapy: Secondary | ICD-10-CM | POA: Insufficient documentation

## 2022-05-17 DIAGNOSIS — Z86718 Personal history of other venous thrombosis and embolism: Secondary | ICD-10-CM | POA: Insufficient documentation

## 2022-05-17 DIAGNOSIS — I252 Old myocardial infarction: Secondary | ICD-10-CM | POA: Diagnosis not present

## 2022-05-17 DIAGNOSIS — E854 Organ-limited amyloidosis: Secondary | ICD-10-CM | POA: Diagnosis not present

## 2022-05-17 DIAGNOSIS — Z955 Presence of coronary angioplasty implant and graft: Secondary | ICD-10-CM | POA: Insufficient documentation

## 2022-05-17 DIAGNOSIS — I43 Cardiomyopathy in diseases classified elsewhere: Secondary | ICD-10-CM | POA: Diagnosis not present

## 2022-05-17 DIAGNOSIS — I480 Paroxysmal atrial fibrillation: Secondary | ICD-10-CM | POA: Insufficient documentation

## 2022-05-17 DIAGNOSIS — Z7901 Long term (current) use of anticoagulants: Secondary | ICD-10-CM | POA: Insufficient documentation

## 2022-05-17 DIAGNOSIS — N184 Chronic kidney disease, stage 4 (severe): Secondary | ICD-10-CM | POA: Diagnosis not present

## 2022-05-17 LAB — BASIC METABOLIC PANEL
Anion gap: 5 (ref 5–15)
BUN: 41 mg/dL — ABNORMAL HIGH (ref 8–23)
CO2: 26 mmol/L (ref 22–32)
Calcium: 9 mg/dL (ref 8.9–10.3)
Chloride: 107 mmol/L (ref 98–111)
Creatinine, Ser: 2.24 mg/dL — ABNORMAL HIGH (ref 0.61–1.24)
GFR, Estimated: 29 mL/min — ABNORMAL LOW (ref >60)
Glucose, Bld: 110 mg/dL — ABNORMAL HIGH (ref 70–99)
Potassium: 4.4 mmol/L (ref 3.5–5.1)
Sodium: 138 mmol/L (ref 135–145)

## 2022-05-17 LAB — BRAIN NATRIURETIC PEPTIDE: B Natriuretic Peptide: 466.1 pg/mL — ABNORMAL HIGH (ref 0.0–100.0)

## 2022-05-17 MED ORDER — POTASSIUM CHLORIDE CRYS ER 10 MEQ PO TBCR: 10.0000 meq | EXTENDED_RELEASE_TABLET | ORAL | 0 refills | Status: DC

## 2022-05-17 MED ORDER — TORSEMIDE 20 MG PO TABS: 40.0000 mg | ORAL_TABLET | Freq: Every day | ORAL | 11 refills | Status: DC

## 2022-05-17 NOTE — Patient Instructions (Signed)
INCREASE Torsemide to 40 mg daily START Potassium 10 meq as needed  Labs today We will only contact you if something comes back abnormal or we need to make some changes. Otherwise no news is good news!  Labs needed in 10 days  Please wear your compression hose daily, place them on as soon as you get up in the morning and remove before you go to bed at night.  Keep cardiology follow up as scheduled   Do the following things EVERYDAY: Weigh yourself in the morning before breakfast. Write it down and keep it in a log. Take your medicines as prescribed Eat low salt foods--Limit salt (sodium) to 2000 mg per day.  Stay as active as you can everyday Limit all fluids for the day to less than 2 liters  At the [facility], you and your health needs are our priority. As part of our continuing mission to provide you with exceptional heart care, we have created designated Provider Care Teams. These Care Teams include your primary Cardiologist (physician) and Advanced Practice Providers (APPs- Physician Assistants and Nurse Practitioners) who all work together to provide you with the care you need, when you need it.   You may see any of the following providers on your designated Care Team at your next follow up: Dr Glori Bickers Dr Loralie Champagne Dr. Roxana Hires, NP Lyda Jester, [place] [facility] [place], [place] Forestine Na, NP Audry Riles, PharmD   Please be sure to bring in all your medications bottles to every appointment.    Thank you for choosing [facility]   If you have any questions or concerns before your next appointment please send [place] a message through [place] or call our office at [phone].    TO LEAVE A MESSAGE FOR THE NURSE SELECT OPTION 2, PLEASE LEAVE A MESSAGE INCLUDING: YOUR NAME DATE OF BIRTH CALL BACK NUMBER REASON FOR CALL**this is important as we prioritize the call  backs  YOU WILL RECEIVE A CALL BACK THE SAME DAY AS LONG AS YOU CALL BEFORE 4:00 PM

## 2022-05-17 NOTE — Progress Notes (Signed)
ReDS Vest / Clip - 05/17/22 1500       ReDS Vest / Clip   Station Marker D    Ruler Value 33    ReDS Value Range Moderate volume overload    ReDS Actual Value 37

## 2022-05-18 ENCOUNTER — Other Ambulatory Visit (HOSPITAL_COMMUNITY): Payer: Self-pay

## 2022-05-19 ENCOUNTER — Other Ambulatory Visit (HOSPITAL_COMMUNITY): Payer: Self-pay

## 2022-05-19 ENCOUNTER — Telehealth (HOSPITAL_COMMUNITY): Payer: Self-pay

## 2022-05-19 MED ORDER — TORSEMIDE 20 MG PO TABS: 20.0000 mg | ORAL_TABLET | Freq: Every day | ORAL | 11 refills | Status: DC

## 2022-05-19 MED ORDER — EMPAGLIFLOZIN 10 MG PO TABS: 10.0000 mg | ORAL_TABLET | Freq: Every day | ORAL | 3 refills | Status: DC

## 2022-05-19 NOTE — Telephone Encounter (Signed)
Called pt to inform him that Gregory Guerrero has some medication changes. Informed pt to decrease Torsemide to 20 mg daily, Start Jardiance 10 mg 1 tablet daily. Pt state that he was given a prescription for labs the day of hid appointment 05/17/2022 to be done in 10-14 days at his local [facility]. Pt verbalized, acknowledge understanding of getting labs drawn and medication changes and new added medications.Gregory Guerrero

## 2022-05-19 NOTE — Addendum Note (Signed)
Encounter addended by: Rafael Bihari, FNP on: 05/19/2022 8:04 AM  Actions taken: Clinical Note Signed

## 2022-05-24 ENCOUNTER — Encounter (HOSPITAL_COMMUNITY): Payer: Self-pay

## 2022-05-30 ENCOUNTER — Other Ambulatory Visit (HOSPITAL_COMMUNITY): Payer: Self-pay

## 2022-06-02 ENCOUNTER — Other Ambulatory Visit (HOSPITAL_COMMUNITY): Payer: Self-pay | Admitting: Family Medicine

## 2022-06-02 DIAGNOSIS — I509 Heart failure, unspecified: Secondary | ICD-10-CM | POA: Diagnosis not present

## 2022-06-03 LAB — BASIC METABOLIC PANEL
BUN/Creatinine Ratio: 20 (ref 10–24)
BUN: 40 mg/dL — ABNORMAL HIGH (ref 8–27)
CO2: 21 mmol/L (ref 20–29)
Calcium: 9.1 mg/dL (ref 8.6–10.2)
Chloride: 108 mmol/L — ABNORMAL HIGH (ref 96–106)
Creatinine, Ser: 2.03 mg/dL — ABNORMAL HIGH (ref 0.76–1.27)
Glucose: 90 mg/dL (ref 70–99)
Potassium: 4.3 mmol/L (ref 3.5–5.2)
Sodium: 145 mmol/L — ABNORMAL HIGH (ref 134–144)
eGFR: 33 mL/min/{1.73_m2} — ABNORMAL LOW (ref >59)

## 2022-06-03 LAB — SPECIMEN STATUS REPORT

## 2022-06-06 ENCOUNTER — Other Ambulatory Visit: Payer: Self-pay

## 2022-06-06 ENCOUNTER — Other Ambulatory Visit (HOSPITAL_COMMUNITY): Payer: Self-pay | Admitting: Pharmacist

## 2022-06-06 ENCOUNTER — Other Ambulatory Visit (HOSPITAL_COMMUNITY): Payer: Self-pay

## 2022-06-06 MED ORDER — VYNDAMAX 61 MG PO CAPS
1.0000 | ORAL_CAPSULE | Freq: Every day | ORAL | 3 refills | Status: DC
  Filled 2022-06-06 – 2022-06-21 (×2): qty 90, 90d supply, fill #0
  Filled 2022-09-12: qty 90, 90d supply, fill #1
  Filled 2022-12-06: qty 90, 90d supply, fill #2
  Filled 2023-03-13: qty 90, 90d supply, fill #3

## 2022-06-14 ENCOUNTER — Other Ambulatory Visit (HOSPITAL_COMMUNITY): Payer: Self-pay | Admitting: Family Medicine

## 2022-06-14 DIAGNOSIS — M25511 Pain in right shoulder: Secondary | ICD-10-CM | POA: Diagnosis not present

## 2022-06-15 ENCOUNTER — Encounter (HOSPITAL_COMMUNITY): Payer: Self-pay

## 2022-06-21 ENCOUNTER — Other Ambulatory Visit (HOSPITAL_COMMUNITY): Payer: Self-pay

## 2022-06-23 ENCOUNTER — Other Ambulatory Visit (HOSPITAL_COMMUNITY): Payer: Self-pay

## 2022-06-27 DIAGNOSIS — M25511 Pain in right shoulder: Secondary | ICD-10-CM | POA: Diagnosis not present

## 2022-06-28 ENCOUNTER — Encounter (HOSPITAL_COMMUNITY): Payer: Self-pay

## 2022-06-28 ENCOUNTER — Ambulatory Visit (HOSPITAL_COMMUNITY)
Admission: RE | Admit: 2022-06-28 | Discharge: 2022-06-28 | Disposition: A | Discharge status: Home / Self Care | Payer: Medicare Other | Source: Ambulatory Visit | Attending: Family Medicine | Admitting: Family Medicine

## 2022-06-28 VITALS — BP 136/86 | HR 90 | Wt 188.6 lb

## 2022-06-28 DIAGNOSIS — I130 Hypertensive heart and chronic kidney disease with heart failure and stage 1 through stage 4 chronic kidney disease, or unspecified chronic kidney disease: Secondary | ICD-10-CM | POA: Insufficient documentation

## 2022-06-28 DIAGNOSIS — I480 Paroxysmal atrial fibrillation: Secondary | ICD-10-CM | POA: Diagnosis not present

## 2022-06-28 DIAGNOSIS — Z87891 Personal history of nicotine dependence: Secondary | ICD-10-CM | POA: Insufficient documentation

## 2022-06-28 DIAGNOSIS — Z7982 Long term (current) use of aspirin: Secondary | ICD-10-CM | POA: Insufficient documentation

## 2022-06-28 DIAGNOSIS — Z7984 Long term (current) use of oral hypoglycemic drugs: Secondary | ICD-10-CM | POA: Insufficient documentation

## 2022-06-28 DIAGNOSIS — N184 Chronic kidney disease, stage 4 (severe): Secondary | ICD-10-CM | POA: Insufficient documentation

## 2022-06-28 DIAGNOSIS — I4819 Other persistent atrial fibrillation: Secondary | ICD-10-CM | POA: Diagnosis not present

## 2022-06-28 DIAGNOSIS — I252 Old myocardial infarction: Secondary | ICD-10-CM | POA: Insufficient documentation

## 2022-06-28 DIAGNOSIS — E854 Organ-limited amyloidosis: Secondary | ICD-10-CM | POA: Insufficient documentation

## 2022-06-28 DIAGNOSIS — I5032 Chronic diastolic (congestive) heart failure: Secondary | ICD-10-CM | POA: Diagnosis not present

## 2022-06-28 DIAGNOSIS — E8589 Other amyloidosis: Secondary | ICD-10-CM

## 2022-06-28 DIAGNOSIS — Z79899 Other long term (current) drug therapy: Secondary | ICD-10-CM | POA: Insufficient documentation

## 2022-06-28 DIAGNOSIS — I43 Cardiomyopathy in diseases classified elsewhere: Secondary | ICD-10-CM | POA: Insufficient documentation

## 2022-06-28 DIAGNOSIS — Z7901 Long term (current) use of anticoagulants: Secondary | ICD-10-CM | POA: Insufficient documentation

## 2022-06-28 DIAGNOSIS — I2510 Atherosclerotic heart disease of native coronary artery without angina pectoris: Secondary | ICD-10-CM | POA: Diagnosis not present

## 2022-06-28 MED ORDER — DABIGATRAN ETEXILATE MESYLATE 150 MG PO CAPS: 150.0000 mg | ORAL_CAPSULE | Freq: Two times a day (BID) | ORAL | 1 refills | Status: DC

## 2022-06-28 NOTE — Progress Notes (Signed)
ADVANCED HF CLINIC NOTE  Primary Care: Colon Branch, MD Primary Cardiologist: Jenkins Rouge, MD Nephrologist: Dr. Willene Hatchet [facility]) HF Cardiologist: Dr. Haroldine Laws  HPI: 77 y.o.male with CAD s/p remote stenting of LAD in [place], previous DVT, HTN, CKD IV, PAF and diastolic HF. REferred by Dr. 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   Has been followed by Dr. Johnsie Cancel. Brief cardiac history includes:   Echo 02/01/17 EF A999333 grade 2 diastolic mild MR  ETT A999333 normal    Noted new onset afib 07/27/20/ Started on renal dose xarelto 15 mg daily. -> DCCv 07/27/20 with conversion to NSR    In 07/22/21 recurrent AF/AFL  -> Had successful repeat [facility] on 08/05/21 Done on amiodarone 200 mg daily and pradaxa 150 mg bi.  Following with Dr. [name] for possible Watchman  Referred to Dr. Haroldine Laws for [place] in 6/23 due to LE edema. RHC with prominent v-waves in PCWP tracing suspicious for MR vs severe diastolic dysfunction. TTE with mild MR. 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   RHC 09/28/21   RA = 7 RV = 48/8 PA = 52/16 (34) PCW = 22 (v = 40) Fick cardiac output/index = 4.1/2.0 PVR = 3.2 FA sat = 97% PA sat = 61%, 58%  Echo 6/23 EF 60% strain with cherry on top. RV low normal Mild MR  PYP 10/13/21  markedly positive    cMRI 10/20/21: EF 54% RV 51% Diffuse LGE with ECV 50%  Saw Dr. Marin Olp Serum light chains mildly elevated. Urine IFE normal . Felt not to have light chain amyloidosis. BMBx pending for confirmation .  First saw Dr. Haroldine Laws 7/23, starting Vyndamax. NYHA II and volume OK. Having some orthostatic symptoms but BP ok. Beta blocker stopped with bradycardia.  Follow up with Dr. [name] 1/24, felt not a candidate for PVI and recommended rate control strategy. He was volume overloaded and instructed to increase torsemide to 20 bid x 2 days.  Today he returns for HF follow up with his wife. At last visit he had NYHA III-IIIb symptoms and volume overloaded. I started  him on Jardiance after discussing with his nephrologist.  Overall feeling fine. Breathing has improved, [place] no SOB walking down driveway. Denies palpitations, CP, dizziness, edema, or PND/Orthopnea. Appetite ok. No fever or chills. Weight at home 183 pounds. Taking all medications. Unable to tolerate compression hose.   Past Medical History:  Diagnosis Date   Arthritis    CAD (coronary artery disease)    Chronic kidney disease    DVT (deep venous thrombosis) (HCC)    following spinal surgery Nov 2017   HTN (hypertension)    MI (myocardial infarction) ([place])    1999 ; stents placed    Numbness and tingling in both hands    Spondylosis of cervical spine    Current Outpatient Medications  Medication Sig Dispense Refill   acetaminophen (TYLENOL) 650 MG CR tablet Take 650 mg by mouth every 8 (eight) hours as needed for pain.     Ascorbic Acid (VITAMIN C) 1000 MG tablet Take 1,000 mg by mouth daily.     aspirin EC 81 MG tablet Take 81 mg by mouth at bedtime.     atorvastatin (LIPITOR) 80 MG tablet TAKE 1 TABLET('80MG'$ ) BY MOUTH EVERY OTHER DAY AND TAKE 1/2 TABLET BY MOUTH ON ALTERNATE DAYS 90 tablet 3   dabigatran (PRADAXA) 150 MG CAPS capsule Take 1 capsule (150 mg total) by mouth 2 (  two) times daily. 180 capsule 1   empagliflozin (JARDIANCE) 10 MG TABS tablet Take 1 tablet (10 mg total) by mouth daily before breakfast. 90 tablet 3   Multiple Vitamin (MULTIVITAMIN WITH MINERALS) TABS tablet Take 1 tablet by mouth in the morning.     Tafamidis (VYNDAMAX) 61 MG CAPS Take 1 capsule by mouth daily. 90 capsule 3   torsemide (DEMADEX) 20 MG tablet Take 1 tablet (20 mg total) by mouth daily. 30 tablet 11   No current facility-administered medications for this encounter.   No Known Allergies  Social History   Socioeconomic History   Marital status: Married    Spouse name: Not on file   Number of children: 2   Years of education: Not on file   Highest  education level: Not on file  Occupational History   Occupation: retired , Pension scheme manager busines   Tobacco Use   Smoking status: Former    Types: Cigarettes    Quit date: 04/26/1983    Years since quitting: 39.2   Smokeless tobacco: Never   Tobacco comments:    quit over 40 years ago   Vaping Use   Vaping Use: Never used  Substance and Sexual Activity   Alcohol use: No   Drug use: No   Sexual activity: Never  Other Topics Concern   Not on file  Social History Narrative   Household pt, wife    [place] from [place] Determinants of Health   Financial Resource Strain: [place]  (02/03/2022)   Overall Financial Resource Strain (CARDIA)    Difficulty of Paying Living Expenses: Not hard at all  Food Insecurity: No Food Insecurity (02/03/2022)   Hunger Vital Sign    Worried About Running Out of Food in the Last Year: Never true    [place] in the Last Year: Never true  Transportation Needs: No Transportation Needs (02/03/2022)   PRAPARE - Hydrologist (Medical): No    Lack of Transportation (Non-Medical): No  Physical Activity: Inactive (02/03/2022)   Exercise Vital Sign    Days of Exercise per Week: 0 days    Minutes of Exercise per Session: 0 min  Stress: No Stress Concern Present (02/03/2022)   [place]    Feeling of Stress : Not at all  Social Connections: Socially Isolated (02/03/2022)   Social Connection and Isolation Panel [NHANES]    Frequency of Communication with Friends and Family: Once a week    Frequency of Social Gatherings with Friends and Family: Never    Attends Religious Services: Never    Marine scientist or Organizations: No    Attends Archivist Meetings: Never    Marital Status: Married  Human resources officer Violence: Not At Risk (02/03/2022)   Humiliation, Afraid, Rape, and Kick questionnaire    Fear of Current or Ex-Partner: No     Emotionally Abused: No    Physically Abused: No    Sexually Abused: No   Family History  Problem Relation Age of Onset   Cancer - Other Mother    Heart attack Father    Uterine cancer Sister    Colon cancer Neg Hx    Prostate cancer Neg Hx    BP 136/86   Pulse 90   Wt 85.5 kg (188 lb 9.6 oz)   SpO2 99%   BMI 28.18 kg/m   Wt Readings from Last 3  Encounters:  06/28/22 85.5 kg (188 lb 9.6 oz)  05/17/22 91.6 kg (202 lb)  05/01/22 90.7 kg (200 lb)   PHYSICAL EXAM: General:  NAD. No resp difficulty, walked into clinic HEENT: Normal Neck: Supple. No JVD. Carotids 2+ bilat; no bruits. No lymphadenopathy or thryomegaly appreciated. Cor: PMI nondisplaced. Irregular rate & rhythm. No rubs, gallops or murmurs. Lungs: Clear Abdomen: Soft, nontender, nondistended. No hepatosplenomegaly. No bruits or masses. Good bowel sounds. Extremities: No cyanosis, clubbing, rash, trace pedal edema Neuro: Alert & oriented x 3, cranial nerves grossly intact. Moves all 4 extremities w/o difficulty. Affect pleasant.   ASSESSMENT & PLAN:  1. 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 RV low normal Mild MR - PYP 10/13/21  markedly positive  - cMRI 10/20/21: EF 54%, RV 51% Diffuse LGE with ECV 50% - Now on Vyndamax. - Saw Dr. Marin Olp, Serum light chains mildly elevated. Urine IFE normal. Felt not to have light chain amyloidosis. BMBx pending for confirmation. - Dr. Haroldine Laws had a long discussion about cardiac amyloid diagnosis and difference between TTR and AL amyloid - [facility] genetic testing negative for TTR. - Improved NYHA II. Volume good today, weight down 12 lbs. - Continue torsemide 20 mg daily. - Continue Jardiance 10 mg daily. - Can consider midodrine as needed for orthostatic symptoms. - Encouraged compression hose PRN. - Recent labs ok, K  4.3, SCr 2.03  2. PAF - Off beta blocker due to bradycardia, amiodarone stopped 11/23 due to lack of efficacy but may need to add back for rate control. - EP felt not to be Watchman or ablation candidate, pursue rate control strategy. - Remains in AF, pursuing rate control.  - Continue Pradaxa. No bleeding issues. Refill today.  3. CKD IV - SCr 2.5-3.0 - Follows with Nephrology in [place] - Personally discussed starting Jardiance with his nephrologist, Dr Willene Hatchet - Continue SGLT2i - Recent BMET stable, K 4.03, SCr 2.03  Follow up in 3 months with Dr. Haroldine Laws  Gregory Bihari, Gregory Guerrero  2:08 PM

## 2022-06-28 NOTE — Patient Instructions (Signed)
It was great to see you today! No medication changes are needed at this time.  Your physician wants you to follow-up in: 3-4 months with Dr Haroldine Laws. You will receive a reminder letter in the mail two months in advance. If you don't receive a letter, please call our office to schedule the follow-up appointment.    Do the following things EVERYDAY: Weigh yourself in the morning before breakfast. Write it down and keep it in a log. Take your medicines as prescribed Eat low salt foods--Limit salt (sodium) to 2000 mg per day.  Stay as active as you can everyday Limit all fluids for the day to less than 2 liters  At the [facility], you and your health needs are our priority. As part of our continuing mission to provide you with exceptional heart care, we have created designated Provider Care Teams. These Care Teams include your primary Cardiologist (physician) and Advanced Practice Providers (APPs- Physician Assistants and Nurse Practitioners) who all work together to provide you with the care you need, when you need it.   You may see any of the following providers on your designated Care Team at your next follow up: Dr Glori Bickers Dr Loralie Champagne Dr. Roxana Hires, NP Lyda Jester, [place] [facility], [place] Forestine Na, NP Audry Riles, PharmD   Please be sure to bring in all your medications bottles to every appointment.    Thank you for choosing [facility]   If you have any questions or concerns before your next appointment please send [place] a message through [place] or call our office at [phone].    TO LEAVE A MESSAGE FOR THE NURSE SELECT OPTION 2, PLEASE LEAVE A MESSAGE INCLUDING: YOUR NAME DATE OF BIRTH CALL BACK NUMBER REASON FOR CALL**this is important as we prioritize the call backs  YOU WILL RECEIVE A CALL BACK THE SAME DAY AS LONG AS YOU CALL BEFORE 4:00 PM

## 2022-07-31 ENCOUNTER — Other Ambulatory Visit (HOSPITAL_COMMUNITY): Payer: Self-pay

## 2022-07-31 ENCOUNTER — Other Ambulatory Visit: Payer: Self-pay | Admitting: Cardiovascular Disease

## 2022-08-15 ENCOUNTER — Ambulatory Visit (INDEPENDENT_AMBULATORY_CARE_PROVIDER_SITE_OTHER): Payer: Medicare Other | Admitting: Internal Medicine

## 2022-08-15 ENCOUNTER — Encounter: Payer: Self-pay | Admitting: Internal Medicine

## 2022-08-15 VITALS — BP 118/74 | HR 68 | Temp 98.4°F | Resp 18 | Ht 68.6 in | Wt 190.2 lb

## 2022-08-15 DIAGNOSIS — I10 Essential (primary) hypertension: Secondary | ICD-10-CM | POA: Diagnosis not present

## 2022-08-15 DIAGNOSIS — E785 Hyperlipidemia, unspecified: Secondary | ICD-10-CM

## 2022-08-15 DIAGNOSIS — I480 Paroxysmal atrial fibrillation: Secondary | ICD-10-CM | POA: Diagnosis not present

## 2022-08-15 DIAGNOSIS — N184 Chronic kidney disease, stage 4 (severe): Secondary | ICD-10-CM

## 2022-08-15 LAB — BASIC METABOLIC PANEL
BUN: 41 mg/dL — ABNORMAL HIGH (ref 6–23)
CO2: 25 mEq/L (ref 19–32)
Calcium: 9 mg/dL (ref 8.4–10.5)
Chloride: 115 mEq/L — ABNORMAL HIGH (ref 96–112)
Creatinine, Ser: 2.42 mg/dL — ABNORMAL HIGH (ref 0.40–1.50)
GFR: 24.96 mL/min — ABNORMAL LOW (ref >60.00)
Glucose, Bld: 77 mg/dL (ref 70–99)
Potassium: 5.6 mEq/L — ABNORMAL HIGH (ref 3.5–5.1)
Sodium: 152 mEq/L — ABNORMAL HIGH (ref 135–145)

## 2022-08-15 LAB — LIPID PANEL
Cholesterol: 104 mg/dL (ref 0–200)
HDL: 42.3 mg/dL (ref >39.00)
LDL Cholesterol: 49 mg/dL (ref 0–99)
NonHDL: 61.21
Total CHOL/HDL Ratio: 2
Triglycerides: 60 mg/dL (ref 0.0–149.0)
VLDL: 12 mg/dL (ref 0.0–40.0)

## 2022-08-15 LAB — CBC WITH DIFFERENTIAL/PLATELET
Basophils Absolute: 0 10*3/uL (ref 0.0–0.1)
Basophils Relative: 0.5 % (ref 0.0–3.0)
Eosinophils Absolute: 0.1 10*3/uL (ref 0.0–0.7)
Eosinophils Relative: 1.2 % (ref 0.0–5.0)
HCT: 32.6 % — ABNORMAL LOW (ref 39.0–52.0)
Hemoglobin: 10.4 g/dL — ABNORMAL LOW (ref 13.0–17.0)
Lymphocytes Relative: 23 % (ref 12.0–46.0)
Lymphs Abs: 1.2 10*3/uL (ref 0.7–4.0)
MCHC: 31.9 g/dL (ref 30.0–36.0)
MCV: 90 fl (ref 78.0–100.0)
Monocytes Absolute: 0.7 10*3/uL (ref 0.1–1.0)
Monocytes Relative: 13.7 % — ABNORMAL HIGH (ref 3.0–12.0)
Neutro Abs: 3.1 10*3/uL (ref 1.4–7.7)
Neutrophils Relative %: 61.6 % (ref 43.0–77.0)
Platelets: 161 10*3/uL (ref 150.0–400.0)
RBC: 3.62 Mil/uL — ABNORMAL LOW (ref 4.22–5.81)
RDW: 18.5 % — ABNORMAL HIGH (ref 11.5–15.5)
WBC: 5.1 10*3/uL (ref 4.0–10.5)

## 2022-08-15 NOTE — Progress Notes (Unsigned)
Subjective:    Patient ID: Gregory Guerrero, male    DOB: [birth year], 77 y.o.   MRN: [id number]  DOS:  08/15/2022 Type of visit - description: Routine visit  Since the last visit he is under the care of cardiology, notes reviewed. Reports to me 2 weeks history of pain at the left groin. Denies fall or injury. Pain is worse when he tried to flex the leg. No bulging area, groin is not red or swollen. He admits to some back pain which is at baseline. No abdominal pain  Review of Systems See above   Past Medical History:  Diagnosis Date   Arthritis    CAD (coronary artery disease)    Chronic kidney disease    DVT (deep venous thrombosis) (HCC)    following spinal surgery Nov 2017   HTN (hypertension)    MI (myocardial infarction) (HCC)    1999 ; stents placed    Numbness and tingling in both hands    Spondylosis of cervical spine     Past Surgical History:  Procedure Laterality Date   ANTERIOR CERVICAL DECOMP/DISCECTOMY FUSION N/A 06/27/2017   Procedure: ANTERIOR CERVICAL DECOMPRESSION/DISCECTOMY FUSION C3-4;  Surgeon: Venita Lick, MD;  Location: [facility] OR;  Service: Orthopedics;  Laterality: N/A;  3 hrs   ANTERIOR CERVICAL DECOMP/DISCECTOMY FUSION  01/24/2019   ANTERIOR LAT LUMBAR FUSION N/A 03/01/2016   Procedure: EXTREME LATERAL INTERBODY FUSION  LUMBAR 2-4;  Surgeon: Venita Lick, MD;  Location: [facility] OR;  Service: Orthopedics;  Laterality: N/A;   CARDIAC CATHETERIZATION     20 yrs ago   CARDIOVERSION N/A 07/27/2020   Procedure: CARDIOVERSION;  Surgeon: Wendall Stade, MD;  Location: [facility] ENDOSCOPY;  Service: Cardiovascular;  Laterality: N/A;   CARDIOVERSION N/A 08/05/2021   Procedure: CARDIOVERSION;  Surgeon: Christell Constant, MD;  Location: [facility] ENDOSCOPY;  Service: Cardiovascular;  Laterality: N/A;   JOINT REPLACEMENT     NECK HARDWARE REMOVAL  12/11/2018   RADIOLOGY WITH ANESTHESIA N/A 05/15/2017   Procedure: MRI WITH ANESTHESIA CERVICAL SPINE WITH AND WITHOUT;   Surgeon: Radiologist, Medication, MD;  Location: [facility] OR;  Service: Radiology;  Laterality: N/A;   RIGHT HEART CATH N/A 09/28/2021   Procedure: RIGHT HEART CATH;  Surgeon: Dolores Patty, MD;  Location: [facility] INVASIVE CV LAB;  Service: Cardiovascular;  Laterality: N/A;   SPINAL FUSION N/A 03/02/2016   Procedure: POSTERIOR SPINAL FUSION INTERBODY L2-S1, DECOMPRESSION L4-S1;  Surgeon: Venita Lick, MD;  Location[facility] OR;  Service: Orthopedics;  Laterality: N/A;   TOTAL KNEE ARTHROPLASTY Right 04/30/2017   Procedure: RIGHT TOTAL KNEE ARTHROPLASTY;  Surgeon: Ollen Gross, MD;  Location: [facility] ORS;  Service: Orthopedics;  Laterality: Right;    Current Outpatient Medications  Medication Instructions   acetaminophen (TYLENOL) 650 mg, Oral, Every 8 hours PRN   aspirin EC 81 mg, Oral, Daily at bedtime   atorvastatin (LIPITOR) 80 MG tablet TAKE 1 TABLET(80MG ) BY MOUTH EVERY OTHER DAY AND TAKE 1/2 TABLET BY MOUTH ON ALTERNATE DAYS   dabigatran (PRADAXA) 150 mg, Oral, 2 times daily   empagliflozin (JARDIANCE) 10 mg, Oral, Daily before breakfast   Multiple Vitamin (MULTIVITAMIN WITH MINERALS) TABS tablet 1 tablet, Oral, Every morning   Tafamidis (VYNDAMAX) 61 MG CAPS Take 1 capsule by mouth daily.   torsemide (DEMADEX) 20 mg, Oral, Daily   vitamin C 1,000 mg, Oral, Daily       Objective:   Physical Exam BP 118/74   Pulse (!) 104   Temp 98.4 F (  36.9 C) (Oral)   Resp 18   Ht 5' 8.6" (1.742 m)   Wt 190 lb 4 oz (86.3 kg)   SpO2 97%   BMI 28.42 kg/m  General:   Well developed, NAD, BMI noted. HEENT:  Normocephalic . Face symmetric, atraumatic Lungs:  CTA B Normal respiratory effort, no intercostal retractions, no accessory muscle use. Heart: Irregularly irregular, heart rate when he arrived to the office 104.  Recheck 68. Lower extremities: no pretibial edema bilaterally MSK: No trochanteric bursa TTP R hip: Normal L hip: + Pain elicited with flexion of the leg. Groins: No  lymphadenopathy or hernias noted Skin: Not pale. Not jaundice Neurologic:  alert & oriented X3.  Speech normal, gait appropriate for age and unassisted Psych--  Cognition and judgment appear intact.  Cooperative with normal attention span and concentration.  Behavior appropriate. No anxious or depressed appearing.      Assessment    Assessment--- new patient 01/24/2017 HTN, dx ~ 2010 Hyperlipidemia CKD CV: -CAD, history of remote MI; EET 02-23-17 w/ no ischemia - Diastolic CHF -H/o L  DVT ~ 02/2016 after back surgery - Chronic venous dz - Paroxysmal A-fib Dx 05-2020 during routine, visit BCC, L face, last derm note 2014 Lower extremity ulcer, right leg after an injury H/o elevated PSA per urology note 2014, DRE normal, PSA was 3.7.  Cervical myelopathy: Surgery 06/27/2017: Anterior cervical decompression and discectomy with fusion of C3-C4   PLAN HTN: BP today satisfactory, he checks regularly at home.  On Demadex CKD: On Demadex, Jardiance, check BMP. Chronic diastolic heart failure, cardiac amyloidosis: LOV with CHF clinic 06/28/2022 CAD, paroxysmal A-fib: Per last cardiology note, off beta-blockers due to bradycardia.  Amiodarone was stopped due to lack of efficiency. Was evaluated by EP, not an ablation candidate. He remains anticoagulated, check a CBC. High cholesterol, on atorvastatin.  Check FLP. Left hip pain: sxs c/w a  hip issue.  Plans to see Ortho soon for shoulder pain, recommend to discuss with them.  No evidence of hernia. RTC 6 m

## 2022-08-15 NOTE — Patient Instructions (Addendum)
Vaccines I recommend: Shingrix (shingles) #2 Covid booster if not done after 12-2021 RSV vaccine Tdap (tetanus)  Check the  blood pressure regularly BP GOAL is between 110/65 and  135/85. If it is consistently higher or lower, let me know     GO TO THE LAB : Get the blood work     GO TO THE FRONT DESK, PLEASE SCHEDULE YOUR APPOINTMENTS Come back for checkup in 6 months

## 2022-08-15 NOTE — Progress Notes (Signed)
Date:  08/15/2022   ID:  Gregory Guerrero, DOB 1945-10-20, MRN [id number]  PCP:  Wanda Plump, MD  Cardiologist:  Dr.Janyia Guion   Chief Complaint: Dyspnea  History of Present Illness:   77 y.o. remote stenting of LAD in [place] . History of DVT, HTN, HLD and CRF Chronic venous Disease with edema and right sided ulcers in past. Recurrent afib with [facility] 07/27/20 Recurrence flutter 07/22/21 Rx amiodarone with conversion Issues affording DOAC Dr Lalla Brothers did not think ablation/Watchman warranted with diagnosis of Amyloid   With edema/fatigue/dyspnea and neuropathy concern for amyloid June 2023 right heart cath with 40 mm V waves despite TTE only showing mild MR Concern for amyloid with strain imaging showing classic pattern with apical sparing. PYP 10/13/21 positive and Cardiac MRI  10/20/21  markedly positive with diffuse LGE uptake EF 54% and ECV 50  Started on Tafamidis 10/31/21  Genetic testing TTR negative has maintained NSR on amiodarone Taking Pradaxa for anticoagulation Edema improved with demedex 20 mg daily    Concerned about long term side effects amiodarone Has had Cr as high as 2.99 with GFR 21 and QRS 118 msec QT corrected for bradycardia in 500 range so Tikosyn not ideal. Has CAD with stenting of LAD LVH and preserved EF   He was in rate controlled afib today Has had some tinnitus/dysequilibrium Discussed recurrence of PAF  Seen by Dr Lalla Brothers EP 05/01/22 felt rate control appropriate and amiodarone d/c Indicated not a good candidate for watchman given amyloid and kidney dx  Last Cr 1.96 now on 20 mg demedex needs updated BMET Discussed possible need to repeat MRI to see if he has had improvement amyloid on Tafamidis    Past Medical History:  Diagnosis Date   Arthritis    CAD (coronary artery disease)    Chronic kidney disease    DVT (deep venous thrombosis) (HCC)    following spinal surgery Nov 2017   HTN (hypertension)    MI (myocardial infarction) (HCC)    1999 ; stents placed     Numbness and tingling in both hands    Spondylosis of cervical spine     Past Surgical History:  Procedure Laterality Date   ANTERIOR CERVICAL DECOMP/DISCECTOMY FUSION N/A 06/27/2017   Procedure: ANTERIOR CERVICAL DECOMPRESSION/DISCECTOMY FUSION C3-4;  Surgeon: Venita Lick, MD;  [facility]: [facility] OR;  Service: Orthopedics;  Laterality: N/A;  3 hrs   ANTERIOR CERVICAL DECOMP/DISCECTOMY FUSION  01/24/2019   ANTERIOR LAT LUMBAR FUSION N/A 03/01/2016   Procedure: EXTREME LATERAL INTERBODY FUSION  LUMBAR 2-4;  Surgeon: Venita Lick, MD;  [facility]: [facility] OR;  Service: Orthopedics;  Laterality: N/A;   CARDIAC CATHETERIZATION     20 yrs ago   CARDIOVERSION N/A 07/27/2020   Procedure: CARDIOVERSION;  Surgeon: Wendall Stade, MD;  Location: [facility] ENDOSCOPY;  Service: Cardiovascular;  Laterality: N/A;   CARDIOVERSION N/A 08/05/2021   Procedure: CARDIOVERSION;  Surgeon: Christell Constant, MD;  Location: [facility] ENDOSCOPY;  Service: Cardiovascular;  Laterality: N/A;   JOINT REPLACEMENT     NECK HARDWARE REMOVAL  12/11/2018   RADIOLOGY WITH ANESTHESIA N/A 05/15/2017   Procedure: MRI WITH ANESTHESIA CERVICAL SPINE WITH AND WITHOUT;  Surgeon: Radiologist, Medication, MD;  Location: [facility] OR;  Service: Radiology;  Laterality: N/A;   RIGHT HEART CATH N/A 09/28/2021   Procedure: RIGHT HEART CATH;  Surgeon: Dolores Patty, MD;  [facility] INVASIVE CV LAB;  Service: Cardiovascular;  Laterality: N/A;   SPINAL FUSION N/A 03/02/2016   Procedure:  POSTERIOR SPINAL FUSION INTERBODY L2-S1, DECOMPRESSION L4-S1;  Surgeon: Venita Lick, MD;  Location: [facility] OR;  Service: Orthopedics;  Laterality: N/A;   TOTAL KNEE ARTHROPLASTY Right 04/30/2017   Procedure: RIGHT TOTAL KNEE ARTHROPLASTY;  Surgeon: Ollen Gross, MD;  Location: [facility] ORS;  Service: Orthopedics;  Laterality: Right;    Current Medications: Prior to Admission medications   Medication Sig Start Date End Date Taking? Authorizing Provider  Ascorbic Acid  (VITAMIN C) 1000 MG tablet Take 1,000 mg by mouth daily.    [provider]  aspirin EC 81 MG tablet Take 81 mg by mouth daily.    [provider]  furosemide (LASIX) 20 MG tablet Take 1 tablet (20 mg total) by mouth daily. 01/25/17   Wanda Plump, MD  losartan (COZAAR) 100 MG tablet Take 1 tablet (100 mg total) by mouth daily. 01/05/17   Wendall Stade, MD  Multiple Vitamin (MULTIVITAMIN WITH MINERALS) TABS tablet Take 1 tablet by mouth daily.    [provider]  rosuvastatin (CRESTOR) 20 MG tablet Take 1 tablet (20 mg total) by mouth at bedtime. 01/24/17   Wanda Plump, MD    Allergies:   Patient has no known allergies.   Social History   Socioeconomic History   Marital status: Married    Spouse name: Not on file   Number of children: 2   Years of education: Not on file   Highest education level: Not on file  Occupational History   Occupation: retired , Nature conservation officer busines   Tobacco Use   Smoking status: Former    Types: Cigarettes    Quit date: 04/26/1983    Years since quitting: 39.3   Smokeless tobacco: Never   Tobacco comments:    quit over 40 years ago   Vaping Use   Vaping Use: Never used  Substance and Sexual Activity   Alcohol use: No   Drug use: No   Sexual activity: Never  Other Topics Concern   Not on file  Social History Narrative   Household pt, wife    Gregory Guerrero from [place] 2011    Social Determinants of Health   Financial Resource Strain: Low Risk  (02/03/2022)   Overall Financial Resource Strain (CARDIA)    Difficulty of Paying Living Expenses: Not hard at all  Food Insecurity: No Food Insecurity (02/03/2022)   Hunger Vital Sign    Worried About Running Out of Food in the Last Year: Never true    Ran Out of Food in the Last Year: Never true  Transportation Needs: No Transportation Needs (02/03/2022)   PRAPARE - Administrator, Civil Service (Medical): No    Lack of Transportation (Non-Medical): No  Physical Activity:  Inactive (02/03/2022)   Exercise Vital Sign    Days of Exercise per Week: 0 days    Minutes of Exercise per Session: 0 min  Stress: No Stress Concern Present (02/03/2022)   [employer] of Occupational Health - Occupational Stress Questionnaire    Feeling of Stress : Not at all  Social Connections: Socially Isolated (02/03/2022)   Social Connection and Isolation Panel [NHANES]    Frequency of Communication with Friends and Family: Once a week    Frequency of Social Gatherings with Friends and Family: Never    Attends Religious Services: Never    Database administrator or Organizations: No    Attends Banker Meetings: Never    Marital Status: Married     Family History:  The patient's family history includes Cancer - Other in his mother; Heart attack in his father; Uterine cancer in his sister.  ROS:   Please see the history of present illness.    ROS All other systems reviewed and are negative.   PHYSICAL EXAM:   VS:  There were no vitals taken for this visit.    Affect appropriate Healthy:  appears stated age HEENT: normal Neck supple with no adenopathy JVP normal no bruits no thyromegaly Lungs clear with no wheezing and good diaphragmatic motion Heart:  S1/S2 no murmur, no rub, gallop or click PMI normal Abdomen: benighn, BS positve, no tenderness, no AAA no bruit.  No HSM or HJR Distal pulses intact with no bruits Plus 2 bilateral edema with stasis LLE worse than right  Neuro non-focal post lumbar surgery  Skin warm and dry Post R TKR     Wt Readings from Last 3 Encounters:  08/15/22 190 lb 4 oz (86.3 kg)  06/28/22 188 lb 9.6 oz (85.5 kg)  05/17/22 202 lb (91.6 kg)      Studies/Labs Reviewed:   EKG:    afib rate 80 IVCD   Recent Labs: 09/30/2021: NT-Pro BNP 7,936 10/19/2021: ALT 14 11/22/2021: Hemoglobin 11.0; Platelets 166 05/17/2022: B Natriuretic Peptide 466.1 06/02/2022: BUN 40; Creatinine, Ser 2.03; Potassium 4.3; Sodium 145   Lipid  Panel    Component Value Date/Time   CHOL 122 10/04/2021 1453   CHOL 166 01/15/2018 1516   TRIG 100.0 10/04/2021 1453   HDL 36.90 (L) 10/04/2021 1453   HDL 42 01/15/2018 1516   CHOLHDL 3 10/04/2021 1453   VLDL 20.0 10/04/2021 1453   LDLCALC 65 10/04/2021 1453   LDLCALC 86 01/15/2018 1516    Additional studies/ records that were reviewed today include:  AS above    ASSESSMENT & PLAN:     1. CAD s/p remote LAD stenting -  No chest pain. ETT 02/23/17 non ischemic continue ASA and beta blocker and statin   2. HTN- Well controlled.  Continue current medications and low sodium Dash type diet.    3. HLD-  Continue statin labs with primary   4. Ortho:  Post right TKR continue PT/OT     5. Neuropathy:  Severe cervical disease with dense sensory deficit in both UE;s Post cervical decompression F/U Dr Shon Baton He has stopped Gabapentin on his Own as it made him feel poorly ? Related to amyloid But genetic testing negative so Amvuttra not a possibility   6. 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 CRF: Cr improved has been as high as 2.99 most recent 08/18/22 1/95 Jardiance d/c on 20 mg demedex daily for peripheral edema   BMET today  F/U [facility] 6 months F/U me in a year  Charlton Haws

## 2022-08-16 ENCOUNTER — Telehealth: Payer: Self-pay | Admitting: Internal Medicine

## 2022-08-16 ENCOUNTER — Encounter: Payer: Self-pay | Admitting: Cardiovascular Disease

## 2022-08-16 DIAGNOSIS — E875 Hyperkalemia: Secondary | ICD-10-CM

## 2022-08-16 DIAGNOSIS — I5032 Chronic diastolic (congestive) heart failure: Secondary | ICD-10-CM

## 2022-08-16 NOTE — Telephone Encounter (Signed)
Sodium came back 152, potassium 5.6, creatinine slightly high at 2.4. Patient is on chronic Demadex. Was Rx Jardiance 05/19/2022 however he thinks he started taking it few weeks later. Spoke with the patient, he has no symptoms. Plan: Hold Demadex and Jardiance for now.  Plan to recheck BMP in few days. Will CC this phone note to the cardiology team for further advice.

## 2022-08-17 NOTE — Assessment & Plan Note (Signed)
HTN: BP today satisfactory, he checks regularly at home.  On Demadex CKD: On Demadex, Jardiance, check BMP. Chronic diastolic heart failure, cardiac amyloidosis: LOV with CHF clinic 06/28/2022 CAD, paroxysmal A-fib: Per last cardiology note, off beta-blockers due to bradycardia.  Amiodarone was stopped due to lack of efficiency. Was evaluated by EP, not an ablation candidate. He remains anticoagulated, check a CBC. High cholesterol, on atorvastatin.  Check FLP. Left hip pain: sxs c/w a  hip issue.  Plans to see Ortho soon for shoulder pain, recommend to discuss with them.  No evidence of hernia. RTC 6 m

## 2022-08-17 NOTE — Telephone Encounter (Signed)
Please place order for a stat BMP.  DX hyperkalemia. To be done early in the morning tomorrow, patient likes to use [facility] in [place]

## 2022-08-17 NOTE — Telephone Encounter (Signed)
BMP ordered STAT to [facility]

## 2022-08-18 ENCOUNTER — Telehealth: Payer: Self-pay | Admitting: Internal Medicine

## 2022-08-18 ENCOUNTER — Encounter: Payer: Self-pay | Admitting: Internal Medicine

## 2022-08-18 DIAGNOSIS — I5032 Chronic diastolic (congestive) heart failure: Secondary | ICD-10-CM | POA: Diagnosis not present

## 2022-08-18 NOTE — Telephone Encounter (Signed)
BMP was ordered, results not available, called [facility], results will be ready in few hours.  Will check in the morning.  Patient aware.

## 2022-08-19 LAB — BASIC METABOLIC PANEL
BUN/Creatinine Ratio: 19 (ref 10–24)
BUN: 38 mg/dL — ABNORMAL HIGH (ref 8–27)
CO2: 20 mmol/L (ref 20–29)
Calcium: 9.1 mg/dL (ref 8.6–10.2)
Chloride: 109 mmol/L — ABNORMAL HIGH (ref 96–106)
Creatinine, Ser: 1.95 mg/dL — ABNORMAL HIGH (ref 0.76–1.27)
Glucose: 91 mg/dL (ref 70–99)
Potassium: 5.3 mmol/L — ABNORMAL HIGH (ref 3.5–5.2)
Sodium: 141 mmol/L (ref 134–144)
eGFR: 35 mL/min/{1.73_m2} — ABNORMAL LOW (ref >59)

## 2022-08-19 NOTE — Telephone Encounter (Signed)
Labs not uploaded, [facility] called, verbal report: Sodium 141, potassium 5.3, BUN 38, creatinine 1.9. Plan: Go back on Demadex Continue holding Jardiance Will defer further management to cardiology ( note  CC Dr Eden Emms) and nephrology (will call them Monday)  Patient called and aware of above.

## 2022-08-21 DIAGNOSIS — M25552 Pain in left hip: Secondary | ICD-10-CM | POA: Diagnosis not present

## 2022-08-23 DIAGNOSIS — I509 Heart failure, unspecified: Secondary | ICD-10-CM | POA: Diagnosis not present

## 2022-08-23 DIAGNOSIS — I10 Essential (primary) hypertension: Secondary | ICD-10-CM | POA: Diagnosis not present

## 2022-08-23 DIAGNOSIS — E854 Organ-limited amyloidosis: Secondary | ICD-10-CM | POA: Diagnosis not present

## 2022-08-23 DIAGNOSIS — E785 Hyperlipidemia, unspecified: Secondary | ICD-10-CM | POA: Diagnosis not present

## 2022-08-23 DIAGNOSIS — I4891 Unspecified atrial fibrillation: Secondary | ICD-10-CM | POA: Diagnosis not present

## 2022-08-23 DIAGNOSIS — N184 Chronic kidney disease, stage 4 (severe): Secondary | ICD-10-CM | POA: Diagnosis not present

## 2022-08-23 DIAGNOSIS — D649 Anemia, unspecified: Secondary | ICD-10-CM | POA: Diagnosis not present

## 2022-08-25 ENCOUNTER — Ambulatory Visit: Payer: Medicare Other | Attending: Cardiovascular Disease | Admitting: Cardiovascular Disease

## 2022-08-25 ENCOUNTER — Encounter: Payer: Self-pay | Admitting: Cardiovascular Disease

## 2022-08-25 VITALS — BP 116/72 | HR 99 | Ht 68.5 in | Wt 192.0 lb

## 2022-08-25 DIAGNOSIS — I10 Essential (primary) hypertension: Secondary | ICD-10-CM | POA: Insufficient documentation

## 2022-08-25 DIAGNOSIS — E854 Organ-limited amyloidosis: Secondary | ICD-10-CM | POA: Diagnosis not present

## 2022-08-25 DIAGNOSIS — R809 Proteinuria, unspecified: Secondary | ICD-10-CM | POA: Diagnosis not present

## 2022-08-25 DIAGNOSIS — I5032 Chronic diastolic (congestive) heart failure: Secondary | ICD-10-CM | POA: Insufficient documentation

## 2022-08-25 DIAGNOSIS — R309 Painful micturition, unspecified: Secondary | ICD-10-CM | POA: Diagnosis not present

## 2022-08-25 DIAGNOSIS — I43 Cardiomyopathy in diseases classified elsewhere: Secondary | ICD-10-CM

## 2022-08-25 DIAGNOSIS — I2510 Atherosclerotic heart disease of native coronary artery without angina pectoris: Secondary | ICD-10-CM

## 2022-08-25 LAB — BASIC METABOLIC PANEL
BUN/Creatinine Ratio: 19 (ref 10–24)
BUN: 37 mg/dL — ABNORMAL HIGH (ref 8–27)
CO2: 21 mmol/L (ref 20–29)
Calcium: 9.3 mg/dL (ref 8.6–10.2)
Chloride: 106 mmol/L (ref 96–106)
Creatinine, Ser: 1.96 mg/dL — ABNORMAL HIGH (ref 0.76–1.27)
Glucose: 90 mg/dL (ref 70–99)
Potassium: 4.4 mmol/L (ref 3.5–5.2)
Sodium: 143 mmol/L (ref 134–144)
eGFR: 34 mL/min/{1.73_m2} — ABNORMAL LOW (ref >59)

## 2022-08-25 NOTE — Patient Instructions (Addendum)
Medication Instructions:  Your physician recommends that you continue on your current medications as directed. Please refer to the Current Medication list given to you today.  *If you need a refill on your cardiac medications before your next appointment, please call your pharmacy*  Lab Work: Your physician recommends that you have lab work today- BMET  If you have labs (blood work) drawn today and your tests are completely normal, you will receive your results only by: MyChart Message (if you have MyChart) OR A paper copy in the mail If you have any lab test that is abnormal or we need to change your treatment, we will call you to review the results.  Testing/Procedures: None ordered today.  Follow-Up: At [facility], you and your health needs are our priority.  As part of our continuing mission to provide you with exceptional heart care, we have created designated Provider Care Teams.  These Care Teams include your primary Cardiologist (physician) and Advanced Practice Providers (APPs -  Physician Assistants and Nurse Practitioners) who all work together to provide you with the care you need, when you need it.  We recommend signing up for the patient portal called "MyChart".  Sign up information is provided on this After Visit Summary.  MyChart is used to connect with patients for Virtual Visits (Telemedicine).  Patients are able to view lab/test results, encounter notes, upcoming appointments, etc.  Non-urgent messages can be sent to your provider as well.   To learn more about what you can do with MyChart, go to [email or web address].    Your next appointment:   6 months  Provider:   Charlton Haws, MD

## 2022-09-12 ENCOUNTER — Other Ambulatory Visit (HOSPITAL_COMMUNITY): Payer: Self-pay

## 2022-09-15 ENCOUNTER — Other Ambulatory Visit (HOSPITAL_COMMUNITY): Payer: Self-pay

## 2022-10-06 ENCOUNTER — Telehealth (HOSPITAL_COMMUNITY): Payer: Self-pay | Admitting: Pharmacy Technician

## 2022-10-06 ENCOUNTER — Other Ambulatory Visit (HOSPITAL_COMMUNITY): Payer: Self-pay

## 2022-10-06 NOTE — Telephone Encounter (Signed)
Advanced Heart Failure Patient Advocate Encounter  Patient called in stating his PAN grant for Vyndamax had expired. His co-pay for the reminder of the year is $0 (90 days). Will not reapply for grant at this time. Advised patient to call back with issues.  Gregory Guerrero, CPhT

## 2022-10-11 ENCOUNTER — Other Ambulatory Visit (HOSPITAL_COMMUNITY): Payer: Self-pay

## 2022-11-02 ENCOUNTER — Other Ambulatory Visit: Payer: Self-pay

## 2022-12-06 ENCOUNTER — Other Ambulatory Visit (HOSPITAL_COMMUNITY): Payer: Self-pay

## 2022-12-06 ENCOUNTER — Other Ambulatory Visit: Payer: Self-pay

## 2022-12-08 ENCOUNTER — Other Ambulatory Visit (HOSPITAL_COMMUNITY): Payer: Self-pay

## 2022-12-26 DIAGNOSIS — M25511 Pain in right shoulder: Secondary | ICD-10-CM | POA: Diagnosis not present

## 2022-12-26 DIAGNOSIS — M542 Cervicalgia: Secondary | ICD-10-CM | POA: Diagnosis not present

## 2023-01-06 ENCOUNTER — Encounter (HOSPITAL_COMMUNITY): Payer: Self-pay

## 2023-01-11 DIAGNOSIS — M25511 Pain in right shoulder: Secondary | ICD-10-CM | POA: Diagnosis not present

## 2023-01-17 ENCOUNTER — Other Ambulatory Visit (HOSPITAL_COMMUNITY): Payer: Self-pay | Admitting: Family Medicine

## 2023-01-31 DIAGNOSIS — E211 Secondary hyperparathyroidism, not elsewhere classified: Secondary | ICD-10-CM | POA: Diagnosis not present

## 2023-01-31 DIAGNOSIS — D649 Anemia, unspecified: Secondary | ICD-10-CM | POA: Diagnosis not present

## 2023-01-31 DIAGNOSIS — I509 Heart failure, unspecified: Secondary | ICD-10-CM | POA: Diagnosis not present

## 2023-01-31 DIAGNOSIS — E854 Organ-limited amyloidosis: Secondary | ICD-10-CM | POA: Diagnosis not present

## 2023-01-31 DIAGNOSIS — E785 Hyperlipidemia, unspecified: Secondary | ICD-10-CM | POA: Diagnosis not present

## 2023-01-31 DIAGNOSIS — I10 Essential (primary) hypertension: Secondary | ICD-10-CM | POA: Diagnosis not present

## 2023-01-31 DIAGNOSIS — R300 Dysuria: Secondary | ICD-10-CM | POA: Diagnosis not present

## 2023-01-31 DIAGNOSIS — E559 Vitamin D deficiency, unspecified: Secondary | ICD-10-CM | POA: Diagnosis not present

## 2023-01-31 DIAGNOSIS — R809 Proteinuria, unspecified: Secondary | ICD-10-CM | POA: Diagnosis not present

## 2023-01-31 DIAGNOSIS — I4891 Unspecified atrial fibrillation: Secondary | ICD-10-CM | POA: Diagnosis not present

## 2023-01-31 DIAGNOSIS — N184 Chronic kidney disease, stage 4 (severe): Secondary | ICD-10-CM | POA: Diagnosis not present

## 2023-02-14 ENCOUNTER — Ambulatory Visit: Payer: Medicare Other | Admitting: Internal Medicine

## 2023-02-21 ENCOUNTER — Ambulatory Visit (INDEPENDENT_AMBULATORY_CARE_PROVIDER_SITE_OTHER): Payer: Medicare Other | Admitting: Internal Medicine

## 2023-02-21 ENCOUNTER — Encounter: Payer: Self-pay | Admitting: Internal Medicine

## 2023-02-21 VITALS — BP 122/68 | HR 91 | Temp 97.9°F | Resp 16 | Ht 68.5 in | Wt 193.0 lb

## 2023-02-21 DIAGNOSIS — I2510 Atherosclerotic heart disease of native coronary artery without angina pectoris: Secondary | ICD-10-CM

## 2023-02-21 DIAGNOSIS — Z23 Encounter for immunization: Secondary | ICD-10-CM

## 2023-02-21 DIAGNOSIS — N184 Chronic kidney disease, stage 4 (severe): Secondary | ICD-10-CM | POA: Diagnosis not present

## 2023-02-21 DIAGNOSIS — I4819 Other persistent atrial fibrillation: Secondary | ICD-10-CM

## 2023-02-21 NOTE — Progress Notes (Unsigned)
Subjective:    Patient ID: Gregory Guerrero, male    DOB: 1946/02/28, 77 y.o.   MRN: [id number]  DOS:  02/21/2023 Type of visit - description: Follow-up  Here with his wife. Saw nephrology and cardiology, notes reviewed. Denies chest pain or difficulty breathing. His only concern is difficulty finding words when he talks with people.  Denies problems such as lack of memory otherwise, able to manage all of his affairs.   Review of Systems See above   Past Medical History:  Diagnosis Date   Arthritis    CAD (coronary artery disease)    Chronic kidney disease    DVT (deep venous thrombosis) (HCC)    following spinal surgery Nov 2017   HTN (hypertension)    MI (myocardial infarction) (HCC)    1999 ; stents placed    Numbness and tingling in both hands    Spondylosis of cervical spine     Past Surgical History:  Procedure Laterality Date   ANTERIOR CERVICAL DECOMP/DISCECTOMY FUSION N/A 06/27/2017   Procedure: ANTERIOR CERVICAL DECOMPRESSION/DISCECTOMY FUSION C3-4;  Surgeon: Venita Lick, MD;  Location: [facility] OR;  Service: Orthopedics;  Laterality: N/A;  3 hrs   ANTERIOR CERVICAL DECOMP/DISCECTOMY FUSION  01/24/2019   ANTERIOR LAT LUMBAR FUSION N/A 03/01/2016   Procedure: EXTREME LATERAL INTERBODY FUSION  LUMBAR 2-4;  Surgeon: Venita Lick, MD;  Location: [facility] OR;  Service: Orthopedics;  Laterality: N/A;   CARDIAC CATHETERIZATION     20 yrs ago   CARDIOVERSION N/A 07/27/2020   Procedure: CARDIOVERSION;  Surgeon: Wendall Stade, MD;  Location: [facility] ENDOSCOPY;  Service: Cardiovascular;  Laterality: N/A;   CARDIOVERSION N/A 08/05/2021   Procedure: CARDIOVERSION;  Surgeon: Christell Constant, MD;  Location: [facility] ENDOSCOPY;  Service: Cardiovascular;  Laterality: N/A;   JOINT REPLACEMENT     NECK HARDWARE REMOVAL  12/11/2018   RADIOLOGY WITH ANESTHESIA N/A 05/15/2017   Procedure: MRI WITH ANESTHESIA CERVICAL SPINE WITH AND WITHOUT;  Surgeon: Radiologist, Medication, MD;  Location: [facility]  OR;  Service: Radiology;  Laterality: N/A;   RIGHT HEART CATH N/A 09/28/2021   Procedure: RIGHT HEART CATH;  Surgeon: Dolores Patty, MD;  Location: [facility] INVASIVE CV LAB;  Service: Cardiovascular;  Laterality: N/A;   SPINAL FUSION N/A 03/02/2016   Procedure: POSTERIOR SPINAL FUSION INTERBODY L2-S1, DECOMPRESSION L4-S1;  Surgeon: Venita Lick, MD;  Location: [facility] OR;  Service: Orthopedics;  Laterality: N/A;   TOTAL KNEE ARTHROPLASTY Right 04/30/2017   Procedure: RIGHT TOTAL KNEE ARTHROPLASTY;  Surgeon: Ollen Gross, MD;  Location: [facility] ORS;  Service: Orthopedics;  Laterality: Right;    Current Outpatient Medications  Medication Instructions   acetaminophen (TYLENOL) 650 mg, Oral, Every 8 hours PRN   aspirin EC 81 mg, Oral, Daily at bedtime   atorvastatin (LIPITOR) 80 MG tablet TAKE 1 TABLET(80MG ) BY MOUTH EVERY OTHER DAY AND TAKE 1/2 TABLET BY MOUTH ON ALTERNATE DAYS   empagliflozin (JARDIANCE) 10 mg, Oral, Daily before breakfast   Multiple Vitamin (MULTIVITAMIN WITH MINERALS) TABS tablet 1 tablet, Oral, Every morning   Pradaxa 150 mg, Oral, 2 times daily   Tafamidis (VYNDAMAX) 61 MG CAPS Take 1 capsule by mouth daily.   torsemide (DEMADEX) 20 mg, Oral, Daily   vitamin C 1,000 mg, Oral, Daily       Objective:   Physical Exam BP 122/68   Pulse 91   Temp 97.9 F (36.6 C) (Oral)   Resp 16   Ht 5' 8.5" (1.74 m)   Wt 193 lb (  87.5 kg)   SpO2 98%   BMI 28.92 kg/m  General:   Well developed, NAD, BMI noted. HEENT:  Normocephalic . Face symmetric, atraumatic Lungs:  CTA B Normal respiratory effort, no intercostal retractions, no accessory muscle use. Heart: Irregularly irregular   Lower extremities: no pretibial edema bilaterally  Skin: Not pale. Not jaundice Neurologic:  alert & oriented X3.  Speech normal, gait appropriate for age and unassisted Psych--  Cognition and judgment appear intact.  Cooperative with normal attention span and concentration.  Behavior  appropriate. No anxious or depressed appearing.      Assessment   Assessment--- new patient 01/24/2017 HTN, dx ~ 2010 Hyperlipidemia CKD CV: -CAD, history of remote MI; EET 02-23-17 w/ no ischemia - Diastolic CHF -H/o L  DVT ~ 02/2016 after back surgery - Chronic venous dz - Paroxysmal A-fib Dx 05-2020 during routine, visit BCC, L face, last derm note 2014 Lower extremity ulcer, right leg after an injury H/o elevated PSA per urology note 2014, DRE normal, PSA was 3.7.  Cervical myelopathy: Surgery 06/27/2017: Anterior cervical decompression and discectomy with fusion of C3-C4  PLAN HTN: BP is very good today, on Demadex. Dyslipidemia: Controlled on atorvastatin CAD, CHF, A-fib,Cardiac amyloidosis: LOV cardiology 08/2022, note reviewed. Patient noted to be in A-fib, discontinue amiodarone due to side effects and lack of efficacy.  Anticoagulated.  No further attempts for cardioversion. Next visit 6 months CKD: Saw nephrology 01/31/2023.  Felt to be stable. Creatinine 1.7, potassium 4.7, LFTs normal.  Hemoglobin 12.2. Currently not taking Jardiance Preventive care. Elected to have a flu shot at a later time. COVID-vaccine recommended, strongly declined. RTC 4 to 5 months

## 2023-02-21 NOTE — Patient Instructions (Addendum)
Vaccines I recommend:  Tdap (tetanus) RSV vaccine Flu shot   Check the  blood pressure regularly Blood pressure goal:  between 110/65 and  135/85. If it is consistently higher or lower, let me know    Next visit with me in 4 to 5 months    Please schedule it at the front desk

## 2023-02-22 ENCOUNTER — Other Ambulatory Visit (HOSPITAL_COMMUNITY): Payer: Self-pay

## 2023-02-22 NOTE — Assessment & Plan Note (Signed)
HTN: BP is very good today, on Demadex. Dyslipidemia: Controlled on atorvastatin CAD, CHF, A-fib,Cardiac amyloidosis: LOV cardiology 08/2022, note reviewed. Patient noted to be in A-fib, discontinue amiodarone due to side effects and lack of efficacy.  Anticoagulated.  No further attempts for cardioversion. Next visit 6 months CKD: Saw nephrology 01/31/2023.  Felt to be stable. Creatinine 1.7, potassium 4.7, LFTs normal.  Hemoglobin 12.2. Currently not taking Jardiance Preventive care. Elected to have a flu shot at a later time. COVID-vaccine recommended, strongly declined. RTC 4 to 5 months

## 2023-02-27 ENCOUNTER — Other Ambulatory Visit: Payer: Self-pay

## 2023-02-27 ENCOUNTER — Ambulatory Visit: Payer: Medicare Other | Admitting: Cardiovascular Disease

## 2023-02-28 ENCOUNTER — Ambulatory Visit (INDEPENDENT_AMBULATORY_CARE_PROVIDER_SITE_OTHER): Payer: Medicare Other

## 2023-02-28 DIAGNOSIS — M25511 Pain in right shoulder: Secondary | ICD-10-CM | POA: Diagnosis not present

## 2023-02-28 DIAGNOSIS — Z23 Encounter for immunization: Secondary | ICD-10-CM

## 2023-03-02 ENCOUNTER — Other Ambulatory Visit: Payer: Self-pay

## 2023-03-12 ENCOUNTER — Telehealth: Payer: Self-pay

## 2023-03-12 NOTE — Telephone Encounter (Signed)
Received surgery clearance form from [facility]. Pt is needing R reverse total shoulder arthroscopy w/ Dr Ranell Patrick. Surgery date: TBD.  Last OV was 02/21/2023, last EKG 05/17/2022. Please advise if appt is needed?

## 2023-03-13 ENCOUNTER — Other Ambulatory Visit: Payer: Self-pay

## 2023-03-13 ENCOUNTER — Telehealth: Payer: Self-pay | Admitting: *Deleted

## 2023-03-13 NOTE — Telephone Encounter (Signed)
   Pre-operative Risk Assessment    Patient Name: Gregory Guerrero  DOB: [birth year] MRN: [id number]   Last OV: Dr. Eden Emms 08/25/2022 Upcoming OV: Dr. Eden Emms 04/10/2023 pre op added to appt notes.   Request for Surgical Clearance    Procedure:   Right Reverse Total Shoulder Arthroplasty  Date of Surgery:  Clearance TBD                                 Surgeon:  Dr. Malon Kindle Surgeon's Group or Practice Name:  [facility] Phone number:  [phone] Fax number:  [phone]   Type of Clearance Requested:   - Medical  - Pharmacy:  Hold Aspirin and Dabigatran (Pradaxa) Not Indicated.   Type of Anesthesia:   Choice   Additional requests/questions:    Signed, Emmit Pomfret   03/13/2023, 1:17 PM

## 2023-03-13 NOTE — Telephone Encounter (Signed)
Completed form faxed back to ATTN: Jamison Oka at [employer] at [phone]. Form sent for scanning.  Received fax confirmation.

## 2023-03-13 NOTE — Telephone Encounter (Signed)
Okay for my side. Needs cardiology clearance. Avoid hypotension due to CKD.

## 2023-03-13 NOTE — Progress Notes (Signed)
Specialty Pharmacy Refill Coordination Note  ISIDORE FRANC is a 77 y.o. male contacted today regarding refills of specialty medication(s) Tafamidis   Patient requested Delivery   Delivery date: 03/19/23   Verified address: [address] [name] [address]   Medication will be filled on 03/16/23.

## 2023-03-14 NOTE — Telephone Encounter (Signed)
Preoperative team patient has upcoming appointment with Dr. Eden Emms on 04/10/2023.  I will defer preoperative cardiac evaluation and recommendations for anticoagulation hold at that time.  Please add preoperative cardiac evaluation to appointment notes.  Thank you for your help.  Gregory Guerrero. Daniella Dewberry NP-C     03/14/2023, 12:33 PM [facility] [address] Office [phone] Fax [phone]

## 2023-03-16 ENCOUNTER — Other Ambulatory Visit (HOSPITAL_COMMUNITY): Payer: Self-pay

## 2023-03-20 ENCOUNTER — Encounter: Payer: Self-pay | Admitting: Cardiovascular Disease

## 2023-03-21 NOTE — Telephone Encounter (Signed)
Surgeon's office sent duplicate request. I will send notes pt has appt with Dr. Eden Emms 04/10/23 for preop clearance. Once pt has been cleared our office will fax clearance.

## 2023-04-03 NOTE — Progress Notes (Signed)
Date:  04/03/2023   ID:  Gregory Guerrero, DOB 10/06/1945, MRN [id number]  PCP:  Wanda Plump, MD  Cardiologist:  Dr.Cloa Bushong   Chief Complaint: Dyspnea  History of Present Illness:   77 y.o. remote stenting of LAD in [place] . History of DVT, HTN, HLD and CRF Chronic venous Disease with edema and right sided ulcers in past. 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 PYP 10/13/21 positive and Cardiac MRI  10/20/21  markedly positive with diffuse LGE uptake EF 54% and ECV 50  Started on Tafamidis 10/31/21  Genetic testing TTR negative has maintained NSR on amiodarone Taking Pradaxa for anticoagulation Edema improved with demedex 20 mg daily    Concerned about long term side effects amiodarone Has had Cr as high as 2.99 with GFR 21 and QRS 118 msec QT corrected for bradycardia in 500 range so Tikosyn not ideal. Has CAD with stenting of LAD LVH and preserved EF   He was in rate controlled afib today Has had some tinnitus/dysequilibrium Discussed recurrence of PAF  Seen by Dr Lalla Brothers EP 05/01/22 felt rate control appropriate and amiodarone d/c Indicated not a good candidate for watchman given amyloid and kidney dx  Last Cr 1.7 01/31/23 now on 20 mg demedex Discussed possible need to repeat MRI to see if he has had improvement amyloid on Tafamidis   Need right shoulder surgery with Dr Candida Peeling to hold pradaxa 2 days before With half life 8 hours even with mild elevation in Cr this should be sufficient  No angina Really can't use right arm.    Past Medical History:  Diagnosis Date   Arthritis    CAD (coronary artery  disease)    Chronic kidney disease    DVT (deep venous thrombosis) (HCC)    following spinal surgery Nov 2017   HTN (hypertension)    MI (myocardial infarction) (HCC)    1999 ; stents placed    Numbness and tingling in both hands    Spondylosis of cervical spine     Past Surgical History:  Procedure Laterality Date   ANTERIOR CERVICAL DECOMP/DISCECTOMY FUSION N/A 06/27/2017   Procedure: ANTERIOR CERVICAL DECOMPRESSION/DISCECTOMY FUSION C3-4;  Surgeon: Venita Lick, MD;  Location: [facility] OR;  Service: Orthopedics;  Laterality: N/A;  3 hrs   ANTERIOR CERVICAL DECOMP/DISCECTOMY FUSION  01/24/2019   ANTERIOR LAT LUMBAR FUSION N/A 03/01/2016   Procedure: EXTREME LATERAL INTERBODY FUSION  LUMBAR 2-4;  Surgeon: Venita Lick, MD;  Location: [facility] OR;  Service: Orthopedics;  Laterality: N/A;   CARDIAC CATHETERIZATION     20 yrs ago   CARDIOVERSION N/A 07/27/2020   Procedure: CARDIOVERSION;  Surgeon: Wendall Stade, MD;  Location: [facility] ENDOSCOPY;  Service: Cardiovascular;  Laterality: N/A;   CARDIOVERSION N/A 08/05/2021   Procedure: CARDIOVERSION;  Surgeon: Christell Constant, MD;  Location: [facility] ENDOSCOPY;  Service: Cardiovascular;  Laterality: N/A;   JOINT REPLACEMENT     NECK HARDWARE REMOVAL  12/11/2018   RADIOLOGY WITH ANESTHESIA N/A 05/15/2017   Procedure: MRI WITH ANESTHESIA CERVICAL SPINE WITH AND WITHOUT;  Surgeon: Radiologist, Medication, MD;  Location: [facility] OR;  Service: Radiology;  Laterality: N/A;   RIGHT  HEART CATH N/A 09/28/2021   Procedure: RIGHT HEART CATH;  Surgeon: Dolores Patty, MD;  Location: [facility] INVASIVE CV LAB;  Service: Cardiovascular;  Laterality: N/A;   SPINAL FUSION N/A 03/02/2016   Procedure: POSTERIOR SPINAL FUSION INTERBODY L2-S1, DECOMPRESSION L4-S1;  Surgeon: Venita Lick, MD;  Location: [facility] OR;  Service: Orthopedics;  Laterality: N/A;   TOTAL KNEE ARTHROPLASTY Right 04/30/2017   Procedure: RIGHT TOTAL KNEE ARTHROPLASTY;  Surgeon: Ollen Gross, MD;  Location:  [facility] ORS;  Service: Orthopedics;  Laterality: Right;    Current Medications: Prior to Admission medications   Medication Sig Start Date End Date Taking? Authorizing Provider  Ascorbic Acid (VITAMIN C) 1000 MG tablet Take 1,000 mg by mouth daily.    [provider]  aspirin EC 81 MG tablet Take 81 mg by mouth daily.    [provider]  furosemide (LASIX) 20 MG tablet Take 1 tablet (20 mg total) by mouth daily. 01/25/17   Wanda Plump, MD  losartan (COZAAR) 100 MG tablet Take 1 tablet (100 mg total) by mouth daily. 01/05/17   Wendall Stade, MD  Multiple Vitamin (MULTIVITAMIN WITH MINERALS) TABS tablet Take 1 tablet by mouth daily.    [provider]  rosuvastatin (CRESTOR) 20 MG tablet Take 1 tablet (20 mg total) by mouth at bedtime. 01/24/17   Wanda Plump, MD    Allergies:   Patient has no known allergies.   Social History   Socioeconomic History   Marital status: Married    Spouse name: Not on file   Number of children: 2   Years of education: Not on file   Highest education level: Not on file  Occupational History   Occupation: retired , Nature conservation officer busines   Tobacco Use   Smoking status: Former    Current packs/day: 0.00    Types: Cigarettes    Quit date: 04/26/1983    Years since quitting: 39.9   Smokeless tobacco: Never   Tobacco comments:    quit over 40 years ago   Vaping Use   Vaping status: Never Used  Substance and Sexual Activity   Alcohol use: No   Drug use: No   Sexual activity: Never  Other Topics Concern   Not on file  Social History Narrative   Household pt, wife    [place] from [place] 2011    Social Drivers of Health   Financial Resource Strain: Low Risk  (02/03/2022)   Overall Financial Resource Strain (CARDIA)    Difficulty of Paying Living Expenses: Not hard at all  Food Insecurity: No Food Insecurity (02/03/2022)   Hunger Vital Sign    Worried About Running Out of Food in the Last Year: Never true    Ran Out of Food in the Last  Year: Never true  Transportation Needs: No Transportation Needs (02/03/2022)   PRAPARE - Administrator, Civil Service (Medical): No    Lack of Transportation (Non-Medical): No  Physical Activity: Inactive (02/03/2022)   Exercise Vital Sign    Days of Exercise per Week: 0 days    Minutes of Exercise per Session: 0 min  Stress: No Stress Concern Present (02/03/2022)   [employer] of Occupational Health - Occupational Stress Questionnaire    Feeling of Stress : Not at all  Social Connections: Unknown (08/27/2022)   [facility]   Social Network    Social Network: Not on file     Family History:  The patient's family  history includes Cancer - Other in his mother; Heart attack in his father; Uterine cancer in his sister.  ROS:   Please see the history of present illness.    ROS All other systems reviewed and are negative.   PHYSICAL EXAM:   VS:  There were no vitals taken for this visit.    Affect appropriate Healthy:  appears stated age HEENT: normal Neck supple with no adenopathy JVP normal no bruits no thyromegaly Lungs clear with no wheezing and good diaphragmatic motion Heart:  S1/S2 no murmur, no rub, gallop or click PMI normal Abdomen: benighn, BS positve, no tenderness, no AAA no bruit.  No HSM or HJR Distal pulses intact with no bruits Plus 2 bilateral edema with stasis LLE worse than right  Neuro non-focal post lumbar surgery  Skin warm and dry Post R TKR     Wt Readings from Last 3 Encounters:  02/21/23 193 lb (87.5 kg)  08/25/22 192 lb (87.1 kg)  08/15/22 190 lb 4 oz (86.3 kg)      Studies/Labs Reviewed:   EKG:    afib rate 80 IVCD   Recent Labs: 05/17/2022: B Natriuretic Peptide 466.1 08/15/2022: Hemoglobin 10.4; Platelets 161.0 08/25/2022: BUN 37; Creatinine, Ser 1.96; Potassium 4.4; Sodium 143   Lipid Panel    Component Value Date/Time   CHOL 104 08/15/2022 1337   CHOL 166 01/15/2018 1516   TRIG  60.0 08/15/2022 1337   HDL 42.30 08/15/2022 1337   HDL 42 01/15/2018 1516   CHOLHDL 2 08/15/2022 1337   VLDL 12.0 08/15/2022 1337   LDLCALC 49 08/15/2022 1337   LDLCALC 86 01/15/2018 1516    Additional studies/ records that were reviewed today include:  AS above    ASSESSMENT & PLAN:     1. CAD s/p remote LAD stenting -  No chest pain. ETT 02/23/17 non ischemic continue ASA and beta blocker and statin   2. HTN- Well controlled.  Continue current medications and low sodium Dash type diet.    3. HLD-  Continue statin labs with primary   4. Ortho:  Post right TKR continue PT/OT     5. Neuropathy:  Severe cervical disease with dense sensory deficit in both UE;s Post cervical decompression F/U Dr Shon Baton He has stopped Gabapentin on his Own as it made him feel poorly ? Related to amyloid But genetic testing negative so Amvuttra not a possibility   6. 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 CRF: Cr improved 1.7. 20 mg demedex daily for peripheral edema   9. Orhto: clear to have right shoulder replacement with Dr Ranell Patrick Hold Pradaxa 48 hours before procedure Resume when ok with surgeion   F/U me in 6 months   Charlton Haws

## 2023-04-10 ENCOUNTER — Encounter: Payer: Self-pay | Admitting: Cardiovascular Disease

## 2023-04-10 ENCOUNTER — Ambulatory Visit: Payer: Medicare Other | Attending: Cardiovascular Disease | Admitting: Cardiovascular Disease

## 2023-04-10 VITALS — BP 128/68 | HR 93 | Ht 68.5 in | Wt 200.0 lb

## 2023-04-10 DIAGNOSIS — Z7901 Long term (current) use of anticoagulants: Secondary | ICD-10-CM | POA: Insufficient documentation

## 2023-04-10 DIAGNOSIS — Z5181 Encounter for therapeutic drug level monitoring: Secondary | ICD-10-CM | POA: Diagnosis not present

## 2023-04-10 DIAGNOSIS — I480 Paroxysmal atrial fibrillation: Secondary | ICD-10-CM | POA: Diagnosis not present

## 2023-04-10 DIAGNOSIS — I5032 Chronic diastolic (congestive) heart failure: Secondary | ICD-10-CM | POA: Insufficient documentation

## 2023-04-10 DIAGNOSIS — I43 Cardiomyopathy in diseases classified elsewhere: Secondary | ICD-10-CM | POA: Insufficient documentation

## 2023-04-10 DIAGNOSIS — Z01810 Encounter for preprocedural cardiovascular examination: Secondary | ICD-10-CM | POA: Diagnosis not present

## 2023-04-10 DIAGNOSIS — E854 Organ-limited amyloidosis: Secondary | ICD-10-CM | POA: Insufficient documentation

## 2023-04-10 MED ORDER — TORSEMIDE 20 MG PO TABS: 20.0000 mg | ORAL_TABLET | Freq: Two times a day (BID) | ORAL | 3 refills | Status: DC

## 2023-04-10 NOTE — Patient Instructions (Addendum)
Medication Instructions:  Your physician has recommended you make the following change in your medication: 1- INCREASE Demadex 20 mg  by mouth twice daily.  *If you need a refill on your cardiac medications before your next appointment, please call your pharmacy*  Lab Work: If you have labs (blood work) drawn today and your tests are completely normal, you will receive your results only by: MyChart Message (if you have MyChart) OR A paper copy in the mail If you have any lab test that is abnormal or we need to change your treatment, we will call you to review the results.  Testing/Procedures: Your physician has requested that you have a cardiac MRI. Cardiac MRI uses a computer to create images of your heart as its beating, producing both still and moving pictures of your heart and major blood vessels. For further information please visit [email or web address]. Please follow the instruction sheet given to you today for more information.   Follow-Up: At [facility], you and your health needs are our priority.  As part of our continuing mission to provide you with exceptional heart care, we have created designated Provider Care Teams.  These Care Teams include your primary Cardiologist (physician) and Advanced Practice Providers (APPs -  Physician Assistants and Nurse Practitioners) who all work together to provide you with the care you need, when you need it.  We recommend signing up for the patient portal called "MyChart".  Sign up information is provided on this After Visit Summary.  MyChart is used to connect with patients for Virtual Visits (Telemedicine).  Patients are able to view lab/test results, encounter notes, upcoming appointments, etc.  Non-urgent messages can be sent to your provider as well.   To learn more about what you can do with MyChart, go to [email or web address].    Your next appointment:   6 month(s)  Provider:   Charlton Haws, MD

## 2023-04-19 ENCOUNTER — Other Ambulatory Visit (HOSPITAL_COMMUNITY): Payer: Self-pay | Admitting: Family Medicine

## 2023-04-24 ENCOUNTER — Other Ambulatory Visit (HOSPITAL_COMMUNITY): Payer: Self-pay

## 2023-04-25 ENCOUNTER — Other Ambulatory Visit (HOSPITAL_COMMUNITY): Payer: Self-pay

## 2023-04-25 ENCOUNTER — Telehealth (HOSPITAL_COMMUNITY): Payer: Self-pay | Admitting: Pharmacy Technician

## 2023-04-25 NOTE — Telephone Encounter (Signed)
 Advanced Heart Failure Patient Advocate Encounter  Patient has cost concerns about Pradaxa . Current co-pay ~$500. Usually he fills Vyndamax  first, then is in catastrophic coverage, making Pradaxa  free. He cannot get a refill of Vyndfamax until 02/04.  The only assistance I could find was with the RX Advocates. Provided phone number, [phone] . They claim to be able to get the patient a 30 day supply for $80, with a one time $35 enrollment fee. The patient and his wife are going to call them and call me back.   Will follow up.

## 2023-04-26 NOTE — Telephone Encounter (Signed)
 Advanced Heart Failure Patient Advocate Encounter  Patient's wife called back and requested to get a month supply of Pradaxa through [facility] instead to [facility] in [place]. Sent 30 day RX request to Chantel (CMA).   Archer Asa, CPhT

## 2023-04-27 MED ORDER — PRADAXA 150 MG PO CAPS: 150.0000 mg | ORAL_CAPSULE | Freq: Two times a day (BID) | ORAL | 0 refills | Status: DC

## 2023-04-27 NOTE — Addendum Note (Signed)
 Addended by: Theresia Bough on: 04/27/2023 11:36 AM   Modules accepted: Orders

## 2023-05-07 ENCOUNTER — Telehealth (HOSPITAL_COMMUNITY): Payer: Self-pay | Admitting: Pharmacy Technician

## 2023-05-07 NOTE — Telephone Encounter (Signed)
Advanced Heart Failure Patient Advocate Encounter  Sent in additional supporting information for Pradaxa PA via fax, [phone].

## 2023-05-08 NOTE — Telephone Encounter (Signed)
Advanced Heart Failure Patient Advocate Encounter  Prior Authorization for Pradaxa has been approved.    PA# [id number] Effective dates: 05/07/23 through "further notice"  Archer Asa, CPhT

## 2023-05-22 NOTE — H&P (Cosign Needed)
Patient's anticipated LOS is less than 2 midnights, meeting these requirements: - Younger than 52 - Lives within 1 hour of care - Has a competent adult at home to recover with post-op recover - NO history of  - Chronic pain requiring opiods  - Diabetes  - Coronary Artery Disease  - Heart failure  - Heart attack  - Stroke  - DVT/VTE  - Cardiac arrhythmia  - Respiratory Failure/COPD  - Renal failure  - Anemia  - Advanced Liver disease     Gregory Guerrero is an 78 y.o. male.    Chief Complaint: right shoulder pain  HPI: Pt is a 78 y.o. male complaining of right shoulder pain for multiple years. Pain had continually increased since the beginning. X-rays in the clinic show end-stage arthritic changes of the right shoulder. Pt has tried various conservative treatments which have failed to alleviate their symptoms, including injections and therapy. Various options are discussed with the patient. Risks, benefits and expectations were discussed with the patient. Patient understand the risks, benefits and expectations and wishes to proceed with surgery.   PCP:  Gregory Plump, MD  D/C Plans: Home  PMH: Past Medical History:  Diagnosis Date   Arthritis    CAD (coronary artery disease)    Chronic kidney disease    DVT (deep venous thrombosis) (HCC)    following spinal surgery Nov 2017   HTN (hypertension)    MI (myocardial infarction) (HCC)    1999 ; stents placed    Numbness and tingling in both hands    Spondylosis of cervical spine     PSH: Past Surgical History:  Procedure Laterality Date   ANTERIOR CERVICAL DECOMP/DISCECTOMY FUSION N/A 06/27/2017   Procedure: ANTERIOR CERVICAL DECOMPRESSION/DISCECTOMY FUSION C3-4;  Surgeon: Venita Lick, MD;  Location: [facility] OR;  Service: Orthopedics;  Laterality: N/A;  3 hrs   ANTERIOR CERVICAL DECOMP/DISCECTOMY FUSION  01/24/2019   ANTERIOR LAT LUMBAR FUSION N/A 03/01/2016   Procedure: EXTREME LATERAL INTERBODY FUSION  LUMBAR 2-4;   Surgeon: Venita Lick, MD;  Location: [facility] OR;  Service: Orthopedics;  Laterality: N/A;   CARDIAC CATHETERIZATION     20 yrs ago   CARDIOVERSION N/A 07/27/2020   Procedure: CARDIOVERSION;  Surgeon: Wendall Stade, MD;  Location[facility] ENDOSCOPY;  Service: Cardiovascular;  Laterality: N/A;   CARDIOVERSION N/A 08/05/2021   Procedure: CARDIOVERSION;  Surgeon: Christell Constant, MD;  Location: [facility] ENDOSCOPY;  Service: Cardiovascular;  Laterality: N/A;   JOINT REPLACEMENT     NECK HARDWARE REMOVAL  12/11/2018   RADIOLOGY WITH ANESTHESIA N/A 05/15/2017   Procedure: MRI WITH ANESTHESIA CERVICAL SPINE WITH AND WITHOUT;  Surgeon: Radiologist, Medication, MD;  Location: [facility] OR;  Service: Radiology;  Laterality: N/A;   RIGHT HEART CATH N/A 09/28/2021   Procedure: RIGHT HEART CATH;  Surgeon: Dolores Patty, MD;  Location: [facility] INVASIVE CV LAB;  Service: Cardiovascular;  Laterality: N/A;   SPINAL FUSION N/A 03/02/2016   Procedure: POSTERIOR SPINAL FUSION INTERBODY L2-S1, DECOMPRESSION L4-S1;  Surgeon: Venita Lick, MD;  Location: [facility] OR;  Service: Orthopedics;  Laterality: N/A;   TOTAL KNEE ARTHROPLASTY Right 04/30/2017   Procedure: RIGHT TOTAL KNEE ARTHROPLASTY;  Surgeon: Ollen Gross, MD;  Location: [facility] ORS;  Service: Orthopedics;  Laterality: Right;    Social History:  reports that he quit smoking about 40 years ago. His smoking use included cigarettes. He has never used smokeless tobacco. He reports that he does not drink alcohol and does not use drugs. BMI: Estimated  body mass index is 29.97 kg/m as calculated from the following:   Height as of 04/10/23: 5' 8.5" (1.74 m).   Weight as of 04/10/23: 90.7 kg.  Lab Results  Component Value Date   ALBUMIN 3.7 10/19/2021   Diabetes: Patient does not have a diagnosis of diabetes.     Smoking Status:      Allergies:  No Known Allergies  Medications: No current facility-administered medications for this encounter.   Current Outpatient  Medications  Medication Sig Dispense Refill   acetaminophen (TYLENOL) 650 MG CR tablet Take 650 mg by mouth every 8 (eight) hours as needed for pain.     Ascorbic Acid (VITAMIN C) 1000 MG tablet Take 1,000 mg by mouth daily.     aspirin EC 81 MG tablet Take 81 mg by mouth at bedtime.     atorvastatin (LIPITOR) 80 MG tablet TAKE 1 TABLET(80MG ) BY MOUTH EVERY OTHER DAY AND TAKE 1/2 TABLET BY MOUTH ON ALTERNATE DAYS 90 tablet 3   Multiple Vitamin (MULTIVITAMIN WITH MINERALS) TABS tablet Take 1 tablet by mouth in the morning.     PRADAXA 150 MG CAPS capsule Take 1 capsule (150 mg total) by mouth 2 (two) times daily. NEEDS FOLLOW UP APPOINTMENT FOR MORE REFILLS 60 capsule 0   Tafamidis (VYNDAMAX) 61 MG CAPS Take 1 capsule by mouth daily. 90 capsule 3   torsemide (DEMADEX) 20 MG tablet Take 1 tablet (20 mg total) by mouth 2 (two) times daily. 180 tablet 3    No results found for this or any previous visit (from the past 48 hours). No results found.  ROS: Pain with rom of the right upper extremity  Physical Exam: Alert and oriented 78 y.o. male in no acute distress Cranial nerves 2-12 intact Cervical spine: full rom with no tenderness, nv intact distally Chest: active breath sounds bilaterally, no wheeze rhonchi or rales Heart: regular rate and rhythm, no murmur Abd: non tender non distended with active bowel sounds Hip is stable with rom  Right shoulder painful and weak rom Nv intact distally No rashes or edema distally  Assessment/Plan Assessment: right shoulder cuff arthropathy   Plan:  Patient will undergo a right reverse total shoulder by Dr. Ranell Patrick at [place] Risks benefits and expectations were discussed with the patient. Patient understand risks, benefits and expectations and wishes to proceed. Preoperative templating of the joint replacement has been completed, documented, and submitted to the Operating Room personnel in order to optimize intra-operative equipment management.    Alphonsa Overall PA-C, MPAS [facility] is now [employer] [address], [place], [name] [address] Phone: [phone] [email or web address] Facebook  [employer]

## 2023-06-01 ENCOUNTER — Encounter (HOSPITAL_COMMUNITY): Payer: Self-pay

## 2023-06-02 NOTE — Progress Notes (Signed)
Anesthesia Review:  PCP: Willow Ora LOV 02/21/23 Cardiologist : Charlton Haws LOV 04/10/23  Chest x-ray : EKG : 04/10/23  Echo : 09/30/21  Stress test: Cardiac Cath :  09/28/21  Activity level:  Sleep Study/ CPAP : Fasting Blood Sugar :      / Checks Blood Sugar -- times a day:   Blood Thinner/ Instructions /Last Dose: ASA / Instructions/ Last Dose :    Pradaxa  81 mg aspirin

## 2023-06-04 NOTE — Patient Instructions (Signed)
SURGICAL WAITING ROOM VISITATION  Patients having surgery or a procedure may have no more than 2 support people in the waiting area - these visitors may rotate.    Children under the age of 43 must have an adult with them who is not the patient.  Due to an increase in RSV and influenza rates and associated hospitalizations, children ages 84 and under may not visit patients in [facility] hospitals.  Visitors with respiratory illnesses are discouraged from visiting and should remain at home.  If the patient needs to stay at the hospital during part of their recovery, the visitor guidelines for inpatient rooms apply. Pre-op nurse will coordinate an appropriate time for 1 support person to accompany patient in pre-op.  This support person may not rotate.    Please refer to the [facility] website for the visitor guidelines for Inpatients (after your surgery is over and you are in a regular room).       Your procedure is scheduled on:  06/15/2023    Report to [facility]    Report to admitting at   0515 AM   Call this number if you have problems the morning of surgery [phone]   Do not eat food :After Midnight.   After Midnight you may have the following liquids until _ 0430_____ AM  DAY OF SURGERY  Water Non-Citrus Juices (without pulp, NO RED-Apple, White grape, White cranberry) Black Coffee (NO MILK/CREAM OR CREAMERS, sugar ok)  Clear Tea (NO MILK/CREAM OR CREAMERS, sugar ok) regular and decaf                             Plain Jell-O (NO RED)                                           Fruit ices (not with fruit pulp, NO RED)                                     Popsicles (NO RED)                                                               Sports drinks like Gatorade (NO RED)                    The day of surgery:  Drink ONE (1) Pre-Surgery Clear Ensure or G2 at  0430AM  ( have completed by ) the morning of surgery. Drink in one sitting. Do not sip.   This drink was given to you during your hospital  pre-op appointment visit. Nothing else to drink after completing the  Pre-Surgery Clear Ensure or G2.          If you have questions, please contact your surgeon's office.    Oral Hygiene is also important to reduce your risk of infection.  Remember - BRUSH YOUR TEETH THE MORNING OF SURGERY WITH YOUR REGULAR TOOTHPASTE  DENTURES WILL BE REMOVED PRIOR TO SURGERY PLEASE DO NOT APPLY "Poly grip" OR ADHESIVES!!!   Do NOT smoke after Midnight   Stop all vitamins and herbal supplements 7 days before surgery.   Take these medicines the morning of surgery with A SIP OF WATER:  none   DO NOT TAKE ANY ORAL DIABETIC MEDICATIONS DAY OF YOUR SURGERY  Bring CPAP mask and tubing day of surgery.                              You may not have any metal on your body including hair pins, jewelry, and body piercing             Do not wear make-up, lotions, powders, perfumes/cologne, or deodorant  Do not wear nail polish including gel and S&S, artificial/acrylic nails, or any other type of covering on natural nails including finger and toenails. If you have artificial nails, gel coating, etc. that needs to be removed by a nail salon please have this removed prior to surgery or surgery may need to be canceled/ delayed if the surgeon/ anesthesia feels like they are unable to be safely monitored.   Do not shave  48 hours prior to surgery.               Men may shave face and neck.   Do not bring valuables to the hospital. [facility] IS NOT             RESPONSIBLE   FOR VALUABLES.   Contacts, glasses, dentures or bridgework may not be worn into surgery.   Bring small overnight bag day of surgery.   DO NOT BRING YOUR HOME MEDICATIONS TO THE HOSPITAL. PHARMACY WILL DISPENSE MEDICATIONS LISTED ON YOUR MEDICATION LIST TO YOU DURING YOUR ADMISSION IN THE HOSPITAL!    Patients discharged on the day of surgery will not  be allowed to drive home.  Someone NEEDS to stay with you for the first 24 hours after anesthesia.   Special Instructions: Bring a copy of your healthcare power of attorney and living will documents the day of surgery if you haven't scanned them before.              Please read over the following fact sheets you were given: IF YOU HAVE QUESTIONS ABOUT YOUR PRE-OP INSTRUCTIONS PLEASE CALL [phone]   If you received a COVID test during your pre-op visit  it is requested that you wear a mask when out in public, stay away from anyone that may not be feeling well and notify your surgeon if you develop symptoms. If you test positive for Covid or have been in contact with anyone that has tested positive in the last 10 days please notify you surgeon.      Pre-operative 5 CHG Bath Instructions   You can play a key role in reducing the risk of infection after surgery. Your skin needs to be as free of germs as possible. You can reduce the number of germs on your skin by washing with CHG (chlorhexidine gluconate) soap before surgery. CHG is an antiseptic soap that kills germs and continues to kill germs even after washing.   DO NOT use if you have an allergy to chlorhexidine/CHG or antibacterial soaps. If your skin becomes reddened or irritated, stop using the CHG and notify one of our RNs at  [phone].   Please shower with the CHG soap starting 4 days before surgery using the following schedule:     Please keep in mind the following:  DO NOT shave, including legs and underarms, starting the day of your first shower.   You may shave your face at any point before/day of surgery.  Place clean sheets on your bed the day you start using CHG soap. Use a clean washcloth (not used since being washed) for each shower. DO NOT sleep with pets once you start using the CHG.   CHG Shower Instructions:  If you choose to wash your hair and private area, wash first with your normal shampoo/soap.  After you  use shampoo/soap, rinse your hair and body thoroughly to remove shampoo/soap residue.  Turn the water OFF and apply about 3 tablespoons (45 ml) of CHG soap to a CLEAN washcloth.  Apply CHG soap ONLY FROM YOUR NECK DOWN TO YOUR TOES (washing for 3-5 minutes)  DO NOT use CHG soap on face, private areas, open wounds, or sores.  Pay special attention to the area where your surgery is being performed.  If you are having back surgery, having someone wash your back for you may be helpful. Wait 2 minutes after CHG soap is applied, then you may rinse off the CHG soap.  Pat dry with a clean towel  Put on clean clothes/pajamas   If you choose to wear lotion, please use ONLY the CHG-compatible lotions on the back of this paper.     Additional instructions for the day of surgery: DO NOT APPLY any lotions, deodorants, cologne, or perfumes.   Put on clean/comfortable clothes.  Brush your teeth.  Ask your nurse before applying any prescription medications to the skin.      CHG Compatible Lotions   Aveeno Moisturizing lotion  Cetaphil Moisturizing Cream  Cetaphil Moisturizing Lotion  Clairol Herbal Essence Moisturizing Lotion, Dry Skin  Clairol Herbal Essence Moisturizing Lotion, Extra Dry Skin  Clairol Herbal Essence Moisturizing Lotion, Normal Skin  Curel Age Defying Therapeutic Moisturizing Lotion with Alpha Hydroxy  Curel Extreme Care Body Lotion  Curel Soothing Hands Moisturizing Hand Lotion  Curel Therapeutic Moisturizing Cream, Fragrance-Free  Curel Therapeutic Moisturizing Lotion, Fragrance-Free  Curel Therapeutic Moisturizing Lotion, Original Formula  Eucerin Daily Replenishing Lotion  Eucerin Dry Skin Therapy Plus Alpha Hydroxy Crme  Eucerin Dry Skin Therapy Plus Alpha Hydroxy Lotion  Eucerin Original Crme  Eucerin Original Lotion  Eucerin Plus Crme Eucerin Plus Lotion  Eucerin TriLipid Replenishing Lotion  Keri Anti-Bacterial Hand Lotion  Keri Deep Conditioning Original Lotion  Dry Skin Formula Softly Scented  Keri Deep Conditioning Original Lotion, Fragrance Free Sensitive Skin Formula  Keri Lotion Fast Absorbing Fragrance Free Sensitive Skin Formula  Keri Lotion Fast Absorbing Softly Scented Dry Skin Formula  Keri Original Lotion  Keri Skin Renewal Lotion Keri Silky Smooth Lotion  Keri Silky Smooth Sensitive Skin Lotion  Nivea Body Creamy Conditioning Oil  Nivea Body Extra Enriched Lotion  Nivea Body Original Lotion  Nivea Body Sheer Moisturizing Lotion Nivea Crme  Nivea Skin Firming Lotion  NutraDerm 30 Skin Lotion  NutraDerm Skin Lotion  NutraDerm Therapeutic Skin Cream  NutraDerm Therapeutic Skin Lotion  ProShield Protective Hand Cream  Provon moisturizing lotion   [place]- Preparing for Total Shoulder Arthroplasty    Before surgery, you can play an important role. Because skin is not sterile, your skin needs to be as free of germs as possible. You can reduce the number of germs  on your skin by using the following products. Benzoyl Peroxide Gel Reduces the number of germs present on the skin Applied twice a day to shoulder area starting two days before surgery    ==================================================================  Please follow these instructions carefully:  BENZOYL PEROXIDE 5% GEL  Please do not use if you have an allergy to benzoyl peroxide.   If your skin becomes reddened/irritated stop using the benzoyl peroxide.  Starting two days before surgery, apply as follows: Apply benzoyl peroxide in the morning and at night. Apply after taking a shower. If you are not taking a shower clean entire shoulder front, back, and side along with the armpit with a clean wet washcloth.  Place a quarter-sized dollop on your shoulder and rub in thoroughly, making sure to cover the front, back, and side of your shoulder, along with the armpit.   2 days before ____ AM   ____ PM              1 day before ____ AM   ____ PM                          Do this twice a day for two days.  (Last application is the night before surgery, AFTER using the CHG soap as described below).  Do NOT apply benzoyl peroxide gel on the day of surgery.

## 2023-06-05 ENCOUNTER — Ambulatory Visit (HOSPITAL_COMMUNITY)
Admission: RE | Admit: 2023-06-05 | Discharge: 2023-06-05 | Disposition: A | Discharge status: Home / Self Care | Payer: Medicare Other | Source: Ambulatory Visit | Attending: Cardiovascular Disease | Admitting: Cardiovascular Disease

## 2023-06-05 ENCOUNTER — Telehealth (HOSPITAL_COMMUNITY): Payer: Self-pay | Admitting: Pharmacy Technician

## 2023-06-05 ENCOUNTER — Other Ambulatory Visit: Payer: Self-pay

## 2023-06-05 ENCOUNTER — Encounter (HOSPITAL_COMMUNITY): Payer: Self-pay

## 2023-06-05 ENCOUNTER — Encounter (HOSPITAL_COMMUNITY)
Admission: RE | Admit: 2023-06-05 | Discharge: 2023-06-05 | Disposition: A | Discharge status: Home / Self Care | Payer: Medicare Other | Source: Ambulatory Visit | Attending: Orthopedic Surgery

## 2023-06-05 ENCOUNTER — Other Ambulatory Visit (HOSPITAL_COMMUNITY): Payer: Self-pay

## 2023-06-05 ENCOUNTER — Other Ambulatory Visit (HOSPITAL_COMMUNITY): Payer: Self-pay | Admitting: Cardiology

## 2023-06-05 VITALS — BP 131/91 | HR 87 | Temp 98.3°F | Resp 16 | Ht 68.5 in | Wt 189.0 lb

## 2023-06-05 DIAGNOSIS — E854 Organ-limited amyloidosis: Secondary | ICD-10-CM | POA: Insufficient documentation

## 2023-06-05 DIAGNOSIS — I4891 Unspecified atrial fibrillation: Secondary | ICD-10-CM | POA: Insufficient documentation

## 2023-06-05 DIAGNOSIS — I480 Paroxysmal atrial fibrillation: Secondary | ICD-10-CM

## 2023-06-05 DIAGNOSIS — Z01818 Encounter for other preprocedural examination: Secondary | ICD-10-CM | POA: Insufficient documentation

## 2023-06-05 DIAGNOSIS — Z86718 Personal history of other venous thrombosis and embolism: Secondary | ICD-10-CM | POA: Diagnosis not present

## 2023-06-05 DIAGNOSIS — Z01810 Encounter for preprocedural cardiovascular examination: Secondary | ICD-10-CM

## 2023-06-05 DIAGNOSIS — N189 Chronic kidney disease, unspecified: Secondary | ICD-10-CM | POA: Diagnosis not present

## 2023-06-05 DIAGNOSIS — M25511 Pain in right shoulder: Secondary | ICD-10-CM | POA: Diagnosis not present

## 2023-06-05 DIAGNOSIS — Z955 Presence of coronary angioplasty implant and graft: Secondary | ICD-10-CM | POA: Diagnosis not present

## 2023-06-05 DIAGNOSIS — I2510 Atherosclerotic heart disease of native coronary artery without angina pectoris: Secondary | ICD-10-CM | POA: Insufficient documentation

## 2023-06-05 DIAGNOSIS — I5032 Chronic diastolic (congestive) heart failure: Secondary | ICD-10-CM

## 2023-06-05 DIAGNOSIS — I43 Cardiomyopathy in diseases classified elsewhere: Secondary | ICD-10-CM | POA: Insufficient documentation

## 2023-06-05 DIAGNOSIS — I129 Hypertensive chronic kidney disease with stage 1 through stage 4 chronic kidney disease, or unspecified chronic kidney disease: Secondary | ICD-10-CM | POA: Diagnosis not present

## 2023-06-05 DIAGNOSIS — Z7901 Long term (current) use of anticoagulants: Secondary | ICD-10-CM

## 2023-06-05 LAB — CBC
HCT: 38.1 % — ABNORMAL LOW (ref 39.0–52.0)
Hemoglobin: 11.7 g/dL — ABNORMAL LOW (ref 13.0–17.0)
MCH: 30.1 pg (ref 26.0–34.0)
MCHC: 30.7 g/dL (ref 30.0–36.0)
MCV: 97.9 fL (ref 80.0–100.0)
Platelets: 167 10*3/uL (ref 150–400)
RBC: 3.89 MIL/uL — ABNORMAL LOW (ref 4.22–5.81)
RDW: 13.8 % (ref 11.5–15.5)
WBC: 6.9 10*3/uL (ref 4.0–10.5)
nRBC: 0 % (ref 0.0–0.2)

## 2023-06-05 LAB — BASIC METABOLIC PANEL
Anion gap: 11 (ref 5–15)
BUN: 38 mg/dL — ABNORMAL HIGH (ref 8–23)
CO2: 23 mmol/L (ref 22–32)
Calcium: 9.5 mg/dL (ref 8.9–10.3)
Chloride: 107 mmol/L (ref 98–111)
Creatinine, Ser: 1.76 mg/dL — ABNORMAL HIGH (ref 0.61–1.24)
GFR, Estimated: 39 mL/min — ABNORMAL LOW (ref >60)
Glucose, Bld: 95 mg/dL (ref 70–99)
Potassium: 4.3 mmol/L (ref 3.5–5.1)
Sodium: 141 mmol/L (ref 135–145)

## 2023-06-05 LAB — SURGICAL PCR SCREEN
MRSA, PCR: NEGATIVE
Staphylococcus aureus: NEGATIVE

## 2023-06-05 MED ORDER — VYNDAMAX 61 MG PO CAPS
1.0000 | ORAL_CAPSULE | Freq: Every day | ORAL | 3 refills | Status: DC
  Filled 2023-06-05 (×2): qty 90, 90d supply, fill #0
  Filled 2023-08-29: qty 90, 90d supply, fill #1
  Filled 2023-11-08 – 2023-11-19 (×2): qty 90, 90d supply, fill #2
  Filled 2024-02-01 – 2024-02-21 (×2): qty 90, 90d supply, fill #3

## 2023-06-05 NOTE — Progress Notes (Signed)
Specialty Pharmacy Refill Coordination Note  Gregory Guerrero is a 78 y.o. male contacted today regarding refills of specialty medication(s) Tafamidis Jeannie Fend)   Patient requested Delivery   Delivery date: 06/11/23   Verified address: [address] [name] [address]   Medication will be filled on 06/08/23.

## 2023-06-05 NOTE — Telephone Encounter (Signed)
Advanced Heart Failure Patient Advocate Encounter  The patient was approved for a [employer] grant that will help cover the cost of Vyndamax. Total amount awarded, $10,000. Eligibility, 05/06/23 - 05/04/24.  ID [id number]  BIN [id number]  PCN [id number]  Group [id number]  Billing information added to WAM.   Archer Asa, CPhT

## 2023-06-05 NOTE — Progress Notes (Signed)
Specialty Pharmacy Ongoing Clinical Assessment Note  Gregory Guerrero is a 78 y.o. male who is being followed by the specialty pharmacy service for RxSp Cardiology   Patient's specialty medication(s) reviewed today: Tafamidis (Vyndamax)   Missed doses in the last 4 weeks: 0   Patient/Caregiver did not have any additional questions or concerns.   Therapeutic benefit summary: Patient is achieving benefit   Adverse events/side effects summary: No adverse events/side effects   Patient's therapy is appropriate to: Continue    Goals Addressed             This Visit's Progress    Stabilization of disease       Patient is on track. Patient will maintain adherence         Follow up:  6 months  Otto Herb Specialty Pharmacist

## 2023-06-06 ENCOUNTER — Other Ambulatory Visit (HOSPITAL_COMMUNITY): Payer: Medicare Other

## 2023-06-06 NOTE — Progress Notes (Signed)
Anesthesia Chart Review   Case: [id number] Date/Time: 06/15/23 0715   Procedure: REVERSE SHOULDER ARTHROPLASTY (Right: Shoulder) - choice with interscalene block   Anesthesia type: Choice   Pre-op diagnosis: Pain of right shoulder joint   Location: [facility] ROOM 06 / [facility]   Surgeons: Beverely Low, MD       DISCUSSION:78 y.o. 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 Beverely Low.   ETT 02/23/17 non ischemic continue ASA and beta blocker and statin   Pt last seen by cardiology 04/13/2023. Per OV note pt without chest pain, HTN well controlled.  "Ortho: clear to have right shoulder replacement with Dr Ranell Patrick Hold Pradaxa 48 hours before procedure Resume when ok with surgeion . Ok to hold pradaxa 2 days before With half life 8 hours even with mild elevation in Cr this should be sufficient "  VS: BP (!) 131/91   Pulse 87   Temp 36.8 C (Oral)   Resp 16   Ht 5' 8.5" (1.74 m)   Wt 85.7 kg   SpO2 98%   BMI 28.32 kg/m   PROVIDERS: Wanda Plump, MD is PCP    LABS: Labs reviewed: Acceptable for surgery. (all labs ordered are listed, but only abnormal results are displayed)  Labs Reviewed  CBC - Abnormal; Notable for the following components:      Result Value   RBC 3.89 (*)    Hemoglobin 11.7 (*)    HCT 38.1 (*)    All other components within normal limits  BASIC METABOLIC PANEL - Abnormal; Notable for the following components:   BUN 38 (*)    Creatinine, Ser 1.76 (*)    GFR, Estimated 39 (*)    All other components within normal limits  SURGICAL PCR SCREEN     IMAGES:   EKG:   CV: Echo 09/30/2021 1. Myocardium appears bright. Global longitudinal strain is -12.7%  "Cherry on top" pattern consistent with amyloid. . Left ventricular  ejection fraction, by estimation, is 55 to 60%. The left ventricle has  normal function.  There is moderate concentric  left ventricular hypertrophy. Left ventricular diastolic parameters are  consistent with Grade II diastolic dysfunction (pseudonormalization).  Elevated left atrial pressure.   2. Right ventricular systolic function is low normal. The right  ventricular size is normal.   3. Left atrial size was severely dilated.   4. Mild mitral valve regurgitation.   5. The aortic valve is tricuspid. Aortic valve regurgitation is not  visualized. Aortic valve sclerosis is present, with no evidence of aortic  valve stenosis.   6. The inferior vena cava is dilated in size with <50% respiratory  variability, suggesting right atrial pressure of 15 mmHg.   Past Medical History:  Diagnosis Date   Arthritis    CAD (coronary artery disease)    Chronic kidney disease    DVT (deep venous thrombosis) (HCC)    following spinal surgery Nov 2017   HTN (hypertension)    MI (myocardial infarction) (HCC)    1999 ; stents placed    Numbness and tingling in both hands    Spondylosis of cervical spine     Past Surgical History:  Procedure Laterality Date   ANTERIOR CERVICAL DECOMP/DISCECTOMY FUSION N/A 06/27/2017   Procedure: ANTERIOR CERVICAL DECOMPRESSION/DISCECTOMY FUSION C3-4;  Surgeon: Venita Lick, MD;  Location: [facility] OR;  Service: Orthopedics;  Laterality:  N/A;  3 hrs   ANTERIOR CERVICAL DECOMP/DISCECTOMY FUSION  01/24/2019   ANTERIOR LAT LUMBAR FUSION N/A 03/01/2016   Procedure: EXTREME LATERAL INTERBODY FUSION  LUMBAR 2-4;  Surgeon: Venita Lick, MD;  Location: [facility] OR;  Service: Orthopedics;  Laterality: N/A;   CARDIAC CATHETERIZATION     20 yrs ago   CARDIOVERSION N/A 07/27/2020   Procedure: CARDIOVERSION;  Surgeon: Wendall Stade, MD;  Location[facility] ENDOSCOPY;  Service: Cardiovascular;  Laterality: N/A;   CARDIOVERSION N/A 08/05/2021   Procedure: CARDIOVERSION;  Surgeon: Christell Constant, MD;  Location: [facility] ENDOSCOPY;  Service: Cardiovascular;  Laterality: N/A;    JOINT REPLACEMENT     NECK HARDWARE REMOVAL  12/11/2018   RADIOLOGY WITH ANESTHESIA N/A 05/15/2017   Procedure: MRI WITH ANESTHESIA CERVICAL SPINE WITH AND WITHOUT;  Surgeon: Radiologist, Medication, MD;  Location: [facility] OR;  Service: Radiology;  Laterality: N/A;   RIGHT HEART CATH N/A 09/28/2021   Procedure: RIGHT HEART CATH;  Surgeon: Dolores Patty, MD;  Location: [facility] INVASIVE CV LAB;  Service: Cardiovascular;  Laterality: N/A;   SPINAL FUSION N/A 03/02/2016   Procedure: POSTERIOR SPINAL FUSION INTERBODY L2-S1, DECOMPRESSION L4-S1;  Surgeon: Venita Lick, MD;  Location: [facility] OR;  Service: Orthopedics;  Laterality: N/A;   TOTAL KNEE ARTHROPLASTY Right 04/30/2017   Procedure: RIGHT TOTAL KNEE ARTHROPLASTY;  Surgeon: Ollen Gross, MD;  Location: [facility];  Service: Orthopedics;  Laterality: Right;    MEDICATIONS:  acetaminophen (TYLENOL) 650 MG CR tablet   Ascorbic Acid (VITAMIN C) 1000 MG tablet   aspirin EC 81 MG tablet   atorvastatin (LIPITOR) 80 MG tablet   FIBER PO   HYDROcodone-acetaminophen (NORCO/VICODIN) 5-325 MG tablet   Multiple Vitamin (MULTIVITAMIN WITH MINERALS) TABS tablet   PRADAXA 150 MG CAPS capsule   Tafamidis (VYNDAMAX) 61 MG CAPS   torsemide (DEMADEX) 20 MG tablet   No current facility-administered medications for this encounter.    Jodell Cipro Ward, PA-C [facility]-Surgical Testing [phone]

## 2023-06-08 NOTE — Anesthesia Preprocedure Evaluation (Addendum)
 Anesthesia Evaluation  Patient identified by MRN, date of birth, ID band Patient awake    Reviewed: Allergy & Precautions, NPO status , Patient's Chart, lab work & pertinent test results  Airway Mallampati: III  TM Distance: >3 FB Neck ROM: Full    Dental  (+) Dental Advisory Given, Upper Dentures   Pulmonary former smoker   Pulmonary exam normal breath sounds clear to auscultation       Cardiovascular hypertension, + CAD, + Past MI, + Cardiac Stents and + DVT  Normal cardiovascular exam+ dysrhythmias Atrial Fibrillation  Rhythm:Regular Rate:Normal  Cardiac MRI 7/23: Severe LAE. Mild RAE. No PFO/ASD. Normal ascending thoracic aorta 3.2 cm. Trivial pericardial effusion Severe asymmetric hypertrophy of basal and mid septum 16 mm compared to lateral wall 8 mm Normal LV size with no RWMAls Quantitative EF 54% (EDV 186 cc ESV 86 cc SV 100 cc) Normal RV size and function RVEF 51% (EDV 164 cc ESV 81 cc SV 83 cc) Normal tri cuspid AV. Trivial MR.    Neuro/Psych Spondylosis of cervical spine s/p ACDF    GI/Hepatic negative GI ROS, Neg liver ROS,,,  Endo/Other  negative endocrine ROS    Renal/GU Renal InsufficiencyRenal disease     Musculoskeletal  (+) Arthritis ,    Abdominal   Peds  Hematology  (+) Blood dyscrasia, anemia   Anesthesia Other Findings   Reproductive/Obstetrics                              Anesthesia Physical Anesthesia Plan  ASA: 3  Anesthesia Plan: General   Post-op Pain Management: Tylenol PO (pre-op)* and Regional block*   Induction: Intravenous  PONV Risk Score and Plan: 2 and Dexamethasone and Ondansetron  Airway Management Planned: Oral ETT and Video Laryngoscope Planned  Additional Equipment:   Intra-op Plan:   Post-operative Plan: Extubation in OR  Informed Consent: I have reviewed the patients History and Physical, chart, labs and discussed the procedure  including the risks, benefits and alternatives for the proposed anesthesia with the patient or authorized representative who has indicated his/her understanding and acceptance.     Dental advisory given  Plan Discussed with: CRNA  Anesthesia Plan Comments: (See PAT note 06/05/2023)        Anesthesia Quick Evaluation

## 2023-06-13 DIAGNOSIS — R809 Proteinuria, unspecified: Secondary | ICD-10-CM | POA: Diagnosis not present

## 2023-06-13 DIAGNOSIS — D649 Anemia, unspecified: Secondary | ICD-10-CM | POA: Diagnosis not present

## 2023-06-13 DIAGNOSIS — I509 Heart failure, unspecified: Secondary | ICD-10-CM | POA: Diagnosis not present

## 2023-06-13 DIAGNOSIS — I10 Essential (primary) hypertension: Secondary | ICD-10-CM | POA: Diagnosis not present

## 2023-06-13 DIAGNOSIS — N184 Chronic kidney disease, stage 4 (severe): Secondary | ICD-10-CM | POA: Diagnosis not present

## 2023-06-13 DIAGNOSIS — E854 Organ-limited amyloidosis: Secondary | ICD-10-CM | POA: Diagnosis not present

## 2023-06-13 DIAGNOSIS — E785 Hyperlipidemia, unspecified: Secondary | ICD-10-CM | POA: Diagnosis not present

## 2023-06-13 DIAGNOSIS — N189 Chronic kidney disease, unspecified: Secondary | ICD-10-CM | POA: Diagnosis not present

## 2023-06-13 DIAGNOSIS — R300 Dysuria: Secondary | ICD-10-CM | POA: Diagnosis not present

## 2023-06-13 DIAGNOSIS — I4891 Unspecified atrial fibrillation: Secondary | ICD-10-CM | POA: Diagnosis not present

## 2023-06-15 ENCOUNTER — Ambulatory Visit (HOSPITAL_COMMUNITY): Payer: Self-pay | Admitting: Anesthesiology

## 2023-06-15 ENCOUNTER — Ambulatory Visit (HOSPITAL_COMMUNITY): Payer: Medicare Other | Admitting: Physician Assistant

## 2023-06-15 ENCOUNTER — Ambulatory Visit (HOSPITAL_COMMUNITY): Payer: Medicare Other

## 2023-06-15 ENCOUNTER — Encounter (HOSPITAL_COMMUNITY): Payer: Self-pay | Admitting: Orthopedic Surgery

## 2023-06-15 ENCOUNTER — Other Ambulatory Visit: Payer: Self-pay

## 2023-06-15 ENCOUNTER — Ambulatory Visit (HOSPITAL_COMMUNITY)
Admission: RE | Admit: 2023-06-15 | Discharge: 2023-06-15 | Disposition: A | Discharge status: Home / Self Care | Payer: Medicare Other | Source: Ambulatory Visit | Attending: Orthopedic Surgery | Admitting: Orthopedic Surgery

## 2023-06-15 ENCOUNTER — Encounter (HOSPITAL_COMMUNITY)
Admission: RE | Disposition: A | Discharge status: Home / Self Care | Payer: Self-pay | Source: Ambulatory Visit | Attending: Orthopedic Surgery

## 2023-06-15 DIAGNOSIS — I2510 Atherosclerotic heart disease of native coronary artery without angina pectoris: Secondary | ICD-10-CM | POA: Diagnosis not present

## 2023-06-15 DIAGNOSIS — I4819 Other persistent atrial fibrillation: Secondary | ICD-10-CM | POA: Diagnosis not present

## 2023-06-15 DIAGNOSIS — Z01818 Encounter for other preprocedural examination: Secondary | ICD-10-CM

## 2023-06-15 DIAGNOSIS — I4891 Unspecified atrial fibrillation: Secondary | ICD-10-CM

## 2023-06-15 DIAGNOSIS — I252 Old myocardial infarction: Secondary | ICD-10-CM | POA: Diagnosis not present

## 2023-06-15 DIAGNOSIS — Z96611 Presence of right artificial shoulder joint: Secondary | ICD-10-CM | POA: Diagnosis not present

## 2023-06-15 DIAGNOSIS — Z471 Aftercare following joint replacement surgery: Secondary | ICD-10-CM | POA: Diagnosis not present

## 2023-06-15 DIAGNOSIS — M19011 Primary osteoarthritis, right shoulder: Secondary | ICD-10-CM

## 2023-06-15 DIAGNOSIS — N189 Chronic kidney disease, unspecified: Secondary | ICD-10-CM | POA: Diagnosis not present

## 2023-06-15 DIAGNOSIS — I129 Hypertensive chronic kidney disease with stage 1 through stage 4 chronic kidney disease, or unspecified chronic kidney disease: Secondary | ICD-10-CM | POA: Diagnosis not present

## 2023-06-15 DIAGNOSIS — G8918 Other acute postprocedural pain: Secondary | ICD-10-CM | POA: Diagnosis not present

## 2023-06-15 DIAGNOSIS — M25711 Osteophyte, right shoulder: Secondary | ICD-10-CM | POA: Diagnosis not present

## 2023-06-15 DIAGNOSIS — M25511 Pain in right shoulder: Secondary | ICD-10-CM | POA: Diagnosis not present

## 2023-06-15 DIAGNOSIS — I10 Essential (primary) hypertension: Secondary | ICD-10-CM | POA: Diagnosis not present

## 2023-06-15 DIAGNOSIS — Z86718 Personal history of other venous thrombosis and embolism: Secondary | ICD-10-CM | POA: Insufficient documentation

## 2023-06-15 DIAGNOSIS — Z955 Presence of coronary angioplasty implant and graft: Secondary | ICD-10-CM | POA: Insufficient documentation

## 2023-06-15 DIAGNOSIS — Z87891 Personal history of nicotine dependence: Secondary | ICD-10-CM | POA: Diagnosis not present

## 2023-06-15 HISTORY — PX: REVERSE SHOULDER ARTHROPLASTY: SHX5054

## 2023-06-15 SURGERY — ARTHROPLASTY, SHOULDER, TOTAL, REVERSE
Anesthesia: General | Site: Shoulder | Laterality: Right

## 2023-06-15 MED ORDER — ONDANSETRON HCL 4 MG/2ML IJ SOLN: 4.0000 mg | Freq: Once | INTRAMUSCULAR | Status: DC | PRN

## 2023-06-15 MED ORDER — PROPOFOL 10 MG/ML IV BOLUS
INTRAVENOUS | Status: AC
  Filled 2023-06-15: qty 20

## 2023-06-15 MED ORDER — DEXMEDETOMIDINE HCL IN NACL 200 MCG/50ML IV SOLN
INTRAVENOUS | Status: DC | PRN
  Administered 2023-06-15 (×2): 4 ug via INTRAVENOUS

## 2023-06-15 MED ORDER — SUCCINYLCHOLINE CHLORIDE 200 MG/10ML IV SOSY
PREFILLED_SYRINGE | INTRAVENOUS | Status: DC | PRN
  Administered 2023-06-15: 100 mg via INTRAVENOUS

## 2023-06-15 MED ORDER — LIDOCAINE HCL (PF) 2 % IJ SOLN
INTRAMUSCULAR | Status: AC
  Filled 2023-06-15: qty 5

## 2023-06-15 MED ORDER — ONDANSETRON HCL 4 MG/2ML IJ SOLN
INTRAMUSCULAR | Status: AC
  Filled 2023-06-15: qty 2

## 2023-06-15 MED ORDER — DEXMEDETOMIDINE HCL IN NACL 80 MCG/20ML IV SOLN
INTRAVENOUS | Status: AC
  Filled 2023-06-15: qty 20

## 2023-06-15 MED ORDER — FENTANYL CITRATE (PF) 100 MCG/2ML IJ SOLN
INTRAMUSCULAR | Status: AC
  Filled 2023-06-15: qty 2

## 2023-06-15 MED ORDER — FENTANYL CITRATE (PF) 100 MCG/2ML IJ SOLN
INTRAMUSCULAR | Status: DC | PRN
  Administered 2023-06-15 (×2): 50 ug via INTRAVENOUS

## 2023-06-15 MED ORDER — ONDANSETRON HCL 4 MG/2ML IJ SOLN
INTRAMUSCULAR | Status: DC | PRN
  Administered 2023-06-15: 4 mg via INTRAVENOUS

## 2023-06-15 MED ORDER — HYDROCODONE-ACETAMINOPHEN 5-325 MG PO TABS: 1.0000 | ORAL_TABLET | Freq: Every day | ORAL | 0 refills | Status: DC | PRN

## 2023-06-15 MED ORDER — ROCURONIUM BROMIDE 10 MG/ML (PF) SYRINGE
PREFILLED_SYRINGE | INTRAVENOUS | Status: AC
  Filled 2023-06-15: qty 10

## 2023-06-15 MED ORDER — PHENYLEPHRINE 80 MCG/ML (10ML) SYRINGE FOR IV PUSH (FOR BLOOD PRESSURE SUPPORT)
PREFILLED_SYRINGE | INTRAVENOUS | Status: AC
  Filled 2023-06-15: qty 10

## 2023-06-15 MED ORDER — BUPIVACAINE-EPINEPHRINE (PF) 0.25% -1:200000 IJ SOLN
INTRAMUSCULAR | Status: AC
  Filled 2023-06-15: qty 30

## 2023-06-15 MED ORDER — CEFAZOLIN SODIUM-DEXTROSE 2-4 GM/100ML-% IV SOLN
2.0000 g | INTRAVENOUS | Status: AC
  Administered 2023-06-15: 2 g via INTRAVENOUS
  Filled 2023-06-15: qty 100

## 2023-06-15 MED ORDER — CHLORHEXIDINE GLUCONATE 0.12 % MT SOLN
15.0000 mL | Freq: Once | OROMUCOSAL | Status: AC
  Administered 2023-06-15: 15 mL via OROMUCOSAL

## 2023-06-15 MED ORDER — TRANEXAMIC ACID-NACL 1000-0.7 MG/100ML-% IV SOLN
1000.0000 mg | INTRAVENOUS | Status: AC
  Administered 2023-06-15: 1000 mg via INTRAVENOUS
  Filled 2023-06-15: qty 100

## 2023-06-15 MED ORDER — FENTANYL CITRATE PF 50 MCG/ML IJ SOSY: 25.0000 ug | PREFILLED_SYRINGE | INTRAMUSCULAR | Status: DC | PRN

## 2023-06-15 MED ORDER — SUCCINYLCHOLINE CHLORIDE 200 MG/10ML IV SOSY
PREFILLED_SYRINGE | INTRAVENOUS | Status: AC
  Filled 2023-06-15: qty 10

## 2023-06-15 MED ORDER — PROPOFOL 10 MG/ML IV BOLUS
INTRAVENOUS | Status: DC | PRN
  Administered 2023-06-15: 100 mg via INTRAVENOUS

## 2023-06-15 MED ORDER — BUPIVACAINE LIPOSOME 1.3 % IJ SUSP
INTRAMUSCULAR | Status: DC | PRN
  Administered 2023-06-15: 10 mL via PERINEURAL

## 2023-06-15 MED ORDER — LACTATED RINGERS IV SOLN: INTRAVENOUS | Status: DC

## 2023-06-15 MED ORDER — 0.9 % SODIUM CHLORIDE (POUR BTL) OPTIME
TOPICAL | Status: DC | PRN
  Administered 2023-06-15: 1000 mL

## 2023-06-15 MED ORDER — DEXAMETHASONE SODIUM PHOSPHATE 10 MG/ML IJ SOLN
INTRAMUSCULAR | Status: AC
  Filled 2023-06-15: qty 1

## 2023-06-15 MED ORDER — PHENYLEPHRINE HCL-NACL 20-0.9 MG/250ML-% IV SOLN
INTRAVENOUS | Status: DC | PRN
  Administered 2023-06-15: 40 ug/min via INTRAVENOUS

## 2023-06-15 MED ORDER — STERILE WATER FOR IRRIGATION IR SOLN
Status: DC | PRN
  Administered 2023-06-15: 1000 mL

## 2023-06-15 MED ORDER — PHENYLEPHRINE 80 MCG/ML (10ML) SYRINGE FOR IV PUSH (FOR BLOOD PRESSURE SUPPORT)
PREFILLED_SYRINGE | INTRAVENOUS | Status: DC | PRN
Start: 2023-06-15 — End: 2023-06-15
  Administered 2023-06-15: 120 ug via INTRAVENOUS
  Administered 2023-06-15: 40 ug via INTRAVENOUS

## 2023-06-15 MED ORDER — TRANEXAMIC ACID-NACL 1000-0.7 MG/100ML-% IV SOLN
1000.0000 mg | Freq: Once | INTRAVENOUS | Status: AC
  Administered 2023-06-15: 1000 mg via INTRAVENOUS

## 2023-06-15 MED ORDER — TRANEXAMIC ACID-NACL 1000-0.7 MG/100ML-% IV SOLN
INTRAVENOUS | Status: AC
  Filled 2023-06-15: qty 100

## 2023-06-15 MED ORDER — BUPIVACAINE-EPINEPHRINE (PF) 0.25% -1:200000 IJ SOLN
INTRAMUSCULAR | Status: DC | PRN
  Administered 2023-06-15: 20 mL

## 2023-06-15 MED ORDER — ORAL CARE MOUTH RINSE: 15.0000 mL | Freq: Once | OROMUCOSAL | Status: AC

## 2023-06-15 MED ORDER — DEXAMETHASONE SODIUM PHOSPHATE 10 MG/ML IJ SOLN
INTRAMUSCULAR | Status: DC | PRN
  Administered 2023-06-15: 8 mg via INTRAVENOUS

## 2023-06-15 MED ORDER — ACETAMINOPHEN 500 MG PO TABS
1000.0000 mg | ORAL_TABLET | Freq: Once | ORAL | Status: AC
  Administered 2023-06-15: 1000 mg via ORAL
  Filled 2023-06-15: qty 2

## 2023-06-15 MED ORDER — PHENYLEPHRINE HCL-NACL 20-0.9 MG/250ML-% IV SOLN
INTRAVENOUS | Status: AC
  Filled 2023-06-15: qty 250

## 2023-06-15 MED ORDER — LIDOCAINE HCL (PF) 2 % IJ SOLN
INTRAMUSCULAR | Status: DC | PRN
  Administered 2023-06-15: 50 mg via INTRADERMAL

## 2023-06-15 SURGICAL SUPPLY — 64 items
BAG COUNTER SPONGE SURGICOUNT (BAG) IMPLANT
BAG ZIPLOCK 12X15 (MISCELLANEOUS) IMPLANT
BIT DRILL 1.6MX128 (BIT) IMPLANT
BIT DRILL 170X2.5X (BIT) IMPLANT
BIT DRL 170X2.5X (BIT) ×1 IMPLANT
BLADE SAG 18X100X1.27 (BLADE) ×1 IMPLANT
COVER BACK TABLE 60X90IN (DRAPES) ×1 IMPLANT
COVER SURGICAL LIGHT HANDLE (MISCELLANEOUS) ×1 IMPLANT
CUP STAND PE 42 PLUS 9MM (Orthopedic Implant) ×1 IMPLANT
CUP STD PE 42 PLUS 9MM (Orthopedic Implant) IMPLANT
DRAPE INCISE IOBAN 66X45 STRL (DRAPES) ×1 IMPLANT
DRAPE SHEET LG 3/4 BI-LAMINATE (DRAPES) ×1 IMPLANT
DRAPE SURG ORHT 6 SPLT 77X108 (DRAPES) ×2 IMPLANT
DRAPE TOP 10253 STERILE (DRAPES) ×1 IMPLANT
DRAPE U-SHAPE 47X51 STRL (DRAPES) ×1 IMPLANT
DRSG ADAPTIC 3X8 NADH LF (GAUZE/BANDAGES/DRESSINGS) ×1 IMPLANT
DRSG AQUACEL AG ADV 3.5X10 (GAUZE/BANDAGES/DRESSINGS) IMPLANT
DURAPREP 26ML APPLICATOR (WOUND CARE) ×1 IMPLANT
ELECT BLADE TIP CTD 4 INCH (ELECTRODE) ×1 IMPLANT
ELECT NDL TIP 2.8 STRL (NEEDLE) ×1 IMPLANT
ELECT NEEDLE TIP 2.8 STRL (NEEDLE) ×1 IMPLANT
ELECT REM PT RETURN 15FT ADLT (MISCELLANEOUS) ×1 IMPLANT
EPIPHYSI RIGHT SZ 2 (Shoulder) ×1 IMPLANT
EPIPHYSIS RIGHT SZ 2 (Shoulder) IMPLANT
FACESHIELD WRAPAROUND (MASK) ×1 IMPLANT
FACESHIELD WRAPAROUND OR TEAM (MASK) ×1 IMPLANT
GAUZE PAD ABD 8X10 STRL (GAUZE/BANDAGES/DRESSINGS) ×1 IMPLANT
GAUZE SPONGE 4X4 12PLY STRL (GAUZE/BANDAGES/DRESSINGS) ×1 IMPLANT
GLENOSPHERE XTEND LAT 42+0 STD (Miscellaneous) IMPLANT
GLOVE BIOGEL PI IND STRL 7.5 (GLOVE) ×1 IMPLANT
GLOVE BIOGEL PI IND STRL 8.5 (GLOVE) ×1 IMPLANT
GLOVE ORTHO TXT STRL SZ7.5 (GLOVE) ×1 IMPLANT
GLOVE SURG ORTHO 8.5 STRL (GLOVE) ×1 IMPLANT
GOWN STRL REUS W/ TWL XL LVL3 (GOWN DISPOSABLE) ×2 IMPLANT
KIT BASIN OR (CUSTOM PROCEDURE TRAY) ×1 IMPLANT
KIT TURNOVER KIT A (KITS) IMPLANT
MANIFOLD NEPTUNE II (INSTRUMENTS) ×1 IMPLANT
METAGLENE DELTA EXTEND (Trauma) IMPLANT
METAGLENE DXTEND (Trauma) ×1 IMPLANT
NDL MAYO CATGUT SZ4 TPR NDL (NEEDLE) IMPLANT
NEEDLE MAYO CATGUT SZ4 (NEEDLE) IMPLANT
NS IRRIG 1000ML POUR BTL (IV SOLUTION) ×1 IMPLANT
PACK SHOULDER (CUSTOM PROCEDURE TRAY) ×1 IMPLANT
PIN GUIDE 1.2 (PIN) IMPLANT
PIN GUIDE GLENOPHERE 1.5MX300M (PIN) IMPLANT
PIN METAGLENE 2.5 (PIN) IMPLANT
PIN STEINMAN FIXATION KNEE (PIN) IMPLANT
RESTRAINT HEAD UNIVERSAL NS (MISCELLANEOUS) ×1 IMPLANT
SCREW 4.5X36MM (Screw) IMPLANT
SCREW 48L (Screw) IMPLANT
SCREW BN 18X4.5XSTRL SHLDR (Screw) IMPLANT
SLING ARM FOAM STRAP LRG (SOFTGOODS) IMPLANT
SPIKE FLUID TRANSFER (MISCELLANEOUS) ×1 IMPLANT
SPONGE T-LAP 4X18 ~~LOC~~+RFID (SPONGE) IMPLANT
STEM 12 HA (Stem) IMPLANT
STRIP CLOSURE SKIN 1/2X4 (GAUZE/BANDAGES/DRESSINGS) ×1 IMPLANT
SUT FIBERWIRE #2 38 T-5 BLUE (SUTURE) ×1 IMPLANT
SUT MNCRL AB 4-0 PS2 18 (SUTURE) ×1 IMPLANT
SUT VIC AB 0 CT1 36 (SUTURE) ×1 IMPLANT
SUT VIC AB 0 CT2 27 (SUTURE) ×1 IMPLANT
SUT VIC AB 2-0 CT1 TAPERPNT 27 (SUTURE) ×1 IMPLANT
SUTURE FIBERWR #2 38 T-5 BLUE (SUTURE) ×1 IMPLANT
TOWEL GREEN STERILE FF (TOWEL DISPOSABLE) ×1 IMPLANT
TOWEL OR 17X26 10 PK STRL BLUE (TOWEL DISPOSABLE) ×1 IMPLANT

## 2023-06-15 NOTE — Op Note (Signed)
 Gregory Guerrero, Gregory Guerrero MEDICAL RECORD NO: [id number] ACCOUNT NO: [id number] DATE OF BIRTH: 1946-01-03 FACILITY: [facility] LOCATION: [facility] PHYSICIAN: Almedia Balls. Ranell Patrick, MD  Operative Report   DATE OF PROCEDURE: 06/15/2023  PREOPERATIVE DIAGNOSIS:  Right shoulder end-stage osteoarthritis.  POSTOPERATIVE DIAGNOSIS:  Right shoulder end-stage osteoarthritis.  PROCEDURE PERFORMED:  Right reverse total shoulder arthroplasty using Depuy Delta Xtend prosthesis  ATTENDING SURGEON:  Almedia Balls. Ranell Patrick, MD  ASSISTANT:  Konrad Felix Dixon, [place], who was scrubbed during the entire procedure, and necessary for satisfactory completion of surgery.  ANESTHESIA:  General anesthesia was used plus an interscalene block.  ESTIMATED BLOOD LOSS:  150 mL.  FLUID REPLACEMENT:  1000 mL crystalloid.  COUNTS:  Instrument count was correct.  COMPLICATIONS:  There were no complications.  ANTIBIOTICS:  Perioperative antibiotics were given.  INDICATIONS:  The patient is a 78 year old male with worsening right shoulder pain secondary to end-stage osteoarthritis with rotator cuff insufficiency.  The patient has poor function and worsening pain with a probable hemarthrosis in the shoulder.  The  patient has failed all measures of conservative management and desires operative treatment to eliminate pain and restore function.  Informed consent obtained.  DESCRIPTION OF PROCEDURE:  After an adequate level of anesthesia was achieved, the patient was positioned in a modified beach chair position.  The right shoulder was correctly identified and sterile prep and drape were performed.  Timeout called  verifying the correct patient and correct site.  We entered the shoulder using a standard deltopectoral incision starting at the coracoid process and extending down to the anterior humerus using the 10 blade scalpel.  Dissection down through the  subcutaneous tissues using Bovie.  Cephalic vein was identified and taken laterally.   The deltopectoral was taken medially.  The conjoined tendon was identified and retracted medially.  Deep retractor was placed.  Biceps tenodesed in situ with 0 Vicryl  figure-of-eight suture x2.  We then released the subscapularis and subperiosteally off the lesser tuberosity.  This was in poor shape, delaminating, and not repairable.  We tagged for protection of the axillary nerve.  We then released the inferior  capsule extending the shoulder and delivering the humerus out of the wound.  The humerus was devoid of any cartilage.  We reamed the proximal humerus with a 6-mm reamer reaming up to a size 12.  We then used the 12-mm T-handle intramedullary guide and  resected the head at 20 degrees of retroversion with the oscillating saw.  We removed excess osteophytes with a rongeur, subluxing the humerus posteriorly.  We then gained good exposure of the glenoid.  We removed the capsule and the labrum.  We placed  our deep retractors.  We found our center point for a guide pin, which we placed low on the glenoid face.  We drilled bicortically.  We then reamed to the subchondral bone with the baseplate reamer.  Next, we did our peripheral hand reamer to make sure  we had space for the metaglene.  We then drilled out our central peg hole.  We irrigated and then impacted the HA-coated press-fit baseplate in position.  We placed a 48 screw inferiorly, a 36 screw superiorly, and an AT nonlocked screw posteriorly.  We  locked superior and inferior screws.  The baseplate was secured.  We selected a 42+0 glenosphere and attached that to the baseplate.  Once we had that glenosphere in position, I did a finger sweep to make sure we had no soft tissue caught up between the  baseplate and the glenosphere.  Next, we went to the humeral side and reamed for the two right metaphysis.  We then trialed with the 12 stem, two right metaphysis set in the 0 setting and placed in 20 degrees of retroversion.  We trialed with a 42+3   poly, felt like that was a little loose knowing that we would go to a +6 or +9 for the real poly.  We removed all the trial components on the humeral side.  We irrigated thoroughly.  We then used available bone graft from the humeral head with impaction  grafting technique and impacted the HA-coated press fit 12 stem and the two right metaphysis set in the 0 setting and impacted in 20 degrees of retroversion.  With the stem secured, we selected the 42+6 poly trial, placed that on the humeral tray,  reduced the shoulder, felt like we could get the +9 in and it would be a little more stable.  It was just a slight little bit of gapping with that +6, so we went to the +9, impacted down the humeral tray, reduced the shoulder, and we were happy with our  soft tissue tension and balancing appropriate conjoined tension.  No gapping with inferior pole or external rotation.  We irrigated thoroughly.  We then resected the subscapularis remnant and repaired deltopectoral interval with 0-Vicryl suture followed  by 2-0 Vicryl for subcutaneous closure and 4-0 running Monocryl for skin.  Steri-Strips were applied followed by a sterile dressing.  The patient tolerated surgery well.   PUS D: 06/15/2023 9:20:14 am T: 06/15/2023 9:37:00 am  JOB: [id number]/ [id number]

## 2023-06-15 NOTE — Transfer of Care (Signed)
 Immediate Anesthesia Transfer of Care Note  Patient: Gregory Guerrero  Procedure(s) Performed: REVERSE SHOULDER ARTHROPLASTY (Right: Shoulder)  Patient Location: PACU  Anesthesia Type:General  Level of Consciousness: awake, alert , and oriented  Airway & Oxygen Therapy: Patient Spontanous Breathing and Patient connected to face mask oxygen  Post-op Assessment: Report given to RN  Post vital signs: stable  Last Vitals:  Vitals Value Taken Time  BP 110/76 06/15/23 0915  Temp    Pulse 76 06/15/23 0918  Resp 13 06/15/23 0918  SpO2 96 % 06/15/23 0918  Vitals shown include unfiled device data.  Last Pain:  Vitals:   06/15/23 1610  TempSrc:   PainSc: 0-No pain         Complications: No notable events documented.

## 2023-06-15 NOTE — Evaluation (Signed)
 Occupational Therapy Evaluation Patient Details Name: Gregory Guerrero MRN: [id number] DOB: [birth year] Today's Date: 06/15/2023   History of Present Illness   78 yo s/p R reverse TSA. PMH: DM, CAD, CHF, CVA, COPD.     Clinical Impressions .PTA pt lives in a 1 level home with wife and was independent.  Education completed regarding compensatory strategies for ADL tasks and functional mobility, management of sling, R ROM per specified parameters in the order set as indicated below, positioning of operative arm in sitting and supine and edema control, including use of "Iceman" Cold Therapy machine. Caregiver present for education, written handouts provided and reviewed using Teach Back and pt/caregiver verbalized/demonstrated understanding. Due to the below listed deficits, pt requires mod assistance with ADL tasks and CGA assist with functional mobility. Caregiver will be able to provide necessary level of assistance at discharge. Pt to follow up with MD to progress rehab of the operative shoulder.      If plan is discharge home, recommend the following:   A little help with walking and/or transfers;A lot of help with bathing/dressing/bathroom;Assistance with cooking/housework;Direct supervision/assist for medications management;Direct supervision/assist for financial management;Assist for transportation;Help with stairs or ramp for entrance     Functional Status Assessment   Patient has had a recent decline in their functional status and demonstrates the ability to make significant improvements in function in a reasonable and predictable amount of time.     Equipment Recommendations   None recommended by OT      Precautions/Restrictions   Precautions Precautions:  (see below) Required Braces or Orthoses: Sling  No shoulder ROM allowed; elbow/wrist/hand AROM only.    Mobility     Transfers Overall transfer level: Independent Equipment used: 1 person hand held assist                       Balance Overall balance assessment: Mild deficits observed, not formally tested                                         ADL either performed or assessed with clinical judgement   ADL  Per orders, R shoulder parameters as follows for ADL tasks: Abd 0-45; ER 0-20; FF 0-60; No shoulder ROM; elbow/wrist/hand ROM only. While moving within specified parameters, pt/caregiver instructed on bathing and how to donn/doff shirt, placing operative arm through sleeve first when donning and off last when doffing.Pt/caregiver educated on compensatory strategies for LB ADL and strategies to reduce risk of falls.  Pt/caregiver educated on donning/doffing sling and to wear the sling at all times with the exception of ADL, and to loosen the neck strap of the sling when the operative arm is in a supported position when sitting. In sitting or supine, pt instructed to have a pillow behind and under their operative arm to provide support. If assist needed with ambulation, caregiver educated on the importance of walking on pt's non-operative side.  Education regarding use of "IceMan" Cold Therapy completed, including the importance of using a barrier on the shoulder prior to positioning the wrap-on pad. Pt/caregiver verbalized/demonstrated understanding. Teach Back used while caregiver assisted with dressing pt and positioning "wrap-on pad" to facilitate DC.  Vision Baseline Vision/History: 1 Wears glasses Ability to See in Adequate Light: 0 Adequate Patient Visual Report: No change from baseline       Perception Perception: Within Functional Limits       Praxis Praxis: WFL       Pertinent Vitals/Pain Pain Assessment Pain Assessment: No/denies pain     Extremity/Trunk Assessment Upper Extremity Assessment Upper Extremity Assessment: Right hand dominant;RUE deficits/detail RUE Deficits / Details: s/p  R Reverse TSA RUE: Unable to fully assess due to immobilization RUE Sensation: decreased light touch RUE Coordination: decreased fine motor;decreased gross motor   Lower Extremity Assessment Lower Extremity Assessment: Overall WFL for tasks assessed   Cervical / Trunk Assessment Cervical / Trunk Assessment: Kyphotic   Communication Communication Communication: No apparent difficulties   Cognition Arousal: Alert Behavior During Therapy: WFL for tasks assessed/performed Cognition: No apparent impairments                               Following commands: Intact       Cueing  General Comments          Exercises Exercises: Shoulder   Shoulder Instructions Shoulder Instructions Donning/doffing shirt without moving shoulder: Caregiver independent with task Method for sponge bathing under operated UE: Caregiver independent with task Donning/doffing sling/immobilizer: Caregiver independent with task Correct positioning of sling/immobilizer: Caregiver independent with task ROM for elbow, wrist and digits of operated UE: Caregiver independent with task Sling wearing schedule (on at all times/off for ADL's): Caregiver independent with task Proper positioning of operated UE when showering: Caregiver independent with task Dressing change:  (nursing to address) Positioning of UE while sleeping: Caregiver independent with task    Home Living Family/patient expects to be discharged to:: Private residence Living Arrangements: Spouse/significant other Available Help at Discharge: Family;Available 24 hours/day Type of Home: House Home Access: Level entry     Home Layout: One level     Bathroom Shower/Tub: Producer, television/film/video: Standard Bathroom Accessibility: Yes How Accessible: Accessible via walker Home Equipment: Cane - single point          Prior Functioning/Environment Prior Level of Function : Independent/Modified Independent                     OT Problem List: Decreased strength;Decreased range of motion;Decreased activity tolerance;Impaired balance (sitting and/or standing);Decreased coordination;Decreased knowledge of precautions;Cardiopulmonary status limiting activity;Impaired sensation;Increased edema;Impaired UE functional use   OT Treatment/Interventions:        OT Goals(Current goals can be found in the care plan section)   Acute Rehab OT Goals Patient Stated Goal: to go home OT Goal Formulation: With patient/family Time For Goal Achievement: 06/15/23 Potential to Achieve Goals: Good         AM-PAC OT "6 Clicks" Daily Activity     Outcome Measure Help from another person eating meals?: A Little Help from another person taking care of personal grooming?: A Little Help from another person toileting, which includes using toliet, bedpan, or urinal?: A Little Help from another person bathing (including washing, rinsing, drying)?: A Lot Help from another person to put on and taking off regular upper body clothing?: A Lot Help from another person to put on and taking off regular lower body clothing?: A Lot 6 Click Score: 15   End of Session Equipment Utilized During Treatment: Gait belt Nurse Communication: Mobility status;Precautions;Weight bearing status  Activity Tolerance: Patient tolerated treatment well Patient  left: in CPM;with family/visitor present  OT Visit Diagnosis: Other (comment) (decreased ADL's)                Time: 1610-9604 OT Time Calculation (min): 41 min Charges:  OT General Charges $OT Visit: 1 Visit OT Evaluation $OT Eval Low Complexity: 1 Low OT Treatments $Self Care/Home Management : 23-37 mins  Annakate Soulier OT/L Acute Rehabilitation Department  [phone] 06/15/2023, 12:45 PM

## 2023-06-15 NOTE — Anesthesia Postprocedure Evaluation (Signed)
 Anesthesia Post Note  Patient: BRECKYN TICAS  Procedure(s) Performed: REVERSE SHOULDER ARTHROPLASTY (Right: Shoulder)     Patient location during evaluation: PACU Anesthesia Type: General Level of consciousness: awake and alert Pain management: pain level controlled Vital Signs Assessment: post-procedure vital signs reviewed and stable Respiratory status: spontaneous breathing, nonlabored ventilation and respiratory function stable Cardiovascular status: blood pressure returned to baseline and stable Postop Assessment: no apparent nausea or vomiting Anesthetic complications: no   No notable events documented.  Last Vitals:  Vitals:   06/15/23 1040 06/15/23 1045  BP: 111/73 105/73  Pulse: 79 79  Resp: 15 12  Temp: 36.4 C   SpO2: 92% 91%    Last Pain:  Vitals:   06/15/23 1045  TempSrc:   PainSc: 0-No pain                 Collene Schlichter

## 2023-06-15 NOTE — Interval H&P Note (Signed)
 History and Physical Interval Note:  06/15/2023 7:27 AM  Gregory Guerrero  has presented today for surgery, with the diagnosis of Pain of right shoulder joint.  The various methods of treatment have been discussed with the patient and family. After consideration of risks, benefits and other options for treatment, the patient has consented to  Procedure(s) with comments: REVERSE SHOULDER ARTHROPLASTY (Right) - choice with interscalene block as a surgical intervention.  The patient's history has been reviewed, patient examined, no change in status, stable for surgery.  I have reviewed the patient's chart and labs.  Questions were answered to the patient's satisfaction.     Verlee Rossetti

## 2023-06-15 NOTE — Anesthesia Procedure Notes (Signed)
 Anesthesia Regional Block: Interscalene brachial plexus block   Pre-Anesthetic Checklist: , timeout performed,  Correct Patient, Correct Site, Correct Laterality,  Correct Procedure, Correct Position, site marked,  Risks and benefits discussed,  Surgical consent,  Pre-op evaluation,  At surgeon's request and post-op pain management  Laterality: Right  Prep: chloraprep       Needles:  Injection technique: Single-shot  Needle Type: Echogenic Stimulator Needle     Needle Length: 9cm  Needle Gauge: 22     Additional Needles:   Procedures:,,,, ultrasound used (permanent image in chart),,    Narrative:  Start time: 06/15/2023 6:56 AM End time: 06/15/2023 7:04 AM Injection made incrementally with aspirations every 5 mL.  Performed by: Personally  Anesthesiologist: Collene Schlichter, MD  Additional Notes: Functioning IV was confirmed and monitors were applied.  A 90mm 22ga echogenic stimulator needle was used. Sterile prep and drape, hand hygiene, and sterile gloves were used.  Negative aspiration and negative test dose prior to incremental administration of local anesthetic. The patient tolerated the procedure well.  Ultrasound guidance: relevent anatomy identified, needle position confirmed, local anesthetic spread visualized around nerve(s), vascular puncture avoided.  Image printed for medical record.

## 2023-06-15 NOTE — Anesthesia Procedure Notes (Signed)
 Procedure Name: Intubation Date/Time: 06/15/2023 7:45 AM  Performed by: Micki Riley, CRNAPre-anesthesia Checklist: Patient identified, Emergency Drugs available, Suction available and Patient being monitored Patient Re-evaluated:Patient Re-evaluated prior to induction Oxygen Delivery Method: Circle System Utilized Preoxygenation: Pre-oxygenation with 100% oxygen Induction Type: IV induction Ventilation: Mask ventilation without difficulty Laryngoscope Size: McGrath and 3 Grade View: Grade III Tube type: Oral Tube size: 7.5 mm Number of attempts: 1 Airway Equipment and Method: Stylet and Oral airway Placement Confirmation: ETT inserted through vocal cords under direct vision, positive ETCO2 and breath sounds checked- equal and bilateral Tube secured with: Tape Dental Injury: Teeth and Oropharynx as per pre-operative assessment

## 2023-06-15 NOTE — Care Plan (Signed)
 Ortho Bundle Case Management Note  Patient Details  Name: Gregory Guerrero MRN: [id number] Date of Birth: 06/16/1945                  Rt Reverse Shoulder Arthroplasty on 06/15/23  DCP: Home with wife  DME: No needs  PT: HEP   DME Arranged:  N/A DME Agency:       Additional Comments: Please contact me with any questions of if this plan should need to change.    Despina Pole, CCM Case Manager, Raechel Chute [phone] 06/15/2023, 7:00 AM

## 2023-06-15 NOTE — Brief Op Note (Signed)
 06/15/2023  9:14 AM  PATIENT:  Gregory Guerrero  78 y.o. male  PRE-OPERATIVE DIAGNOSIS:  Pain of right shoulder joint, end stage OA  POST-OPERATIVE DIAGNOSIS:  Pain of right shoulder joint, end stage OA  PROCEDURE:  Procedure(s) with comments: REVERSE SHOULDER ARTHROPLASTY (Right) - choice with interscalene block dePuy delta Xtend with NO subscap repair  SURGEON:  Surgeons and Role:    Beverely Low, MD - Primary  PHYSICIAN ASSISTANT:   ASSISTANTS: Thea Gist, PA-C   ANESTHESIA:   regional and general  EBL:  150 cc  BLOOD ADMINISTERED:none  DRAINS: none   LOCAL MEDICATIONS USED:  MARCAINE     SPECIMEN:  No Specimen  DISPOSITION OF SPECIMEN:  N/A  COUNTS:  YES  TOURNIQUET:  * No tourniquets in log *  DICTATION: .Other Dictation: Dictation Number [id number]  PLAN OF CARE: Discharge to home after PACU  PATIENT DISPOSITION:  PACU - hemodynamically stable.   Delay start of Pharmacological VTE agent (>24hrs) due to surgical blood loss or risk of bleeding: not applicable

## 2023-06-15 NOTE — Discharge Instructions (Signed)
 Ice to the shoulder constantly. Change your bandage to the Aquacel on Monday.  Keep the incision covered and clean and dry for one week, then ok to get it wet in the shower. Leave uncovered and open to air after one week  Do exercise as instructed several times per day.  DO NOT reach behind your back or push up out of a chair with the operative arm.  Use a sling while you are up and around for comfort, may remove while seated.  Keep pillow propped behind the operative elbow.  Follow up with Dr Ranell Patrick in two weeks in the office, call [phone] for appt  Please call Dr Ranell Patrick (cell) [phone] with any questions or concerns

## 2023-06-19 ENCOUNTER — Encounter (HOSPITAL_COMMUNITY): Payer: Self-pay | Admitting: Orthopedic Surgery

## 2023-06-25 DIAGNOSIS — Z4789 Encounter for other orthopedic aftercare: Secondary | ICD-10-CM | POA: Diagnosis not present

## 2023-07-05 ENCOUNTER — Encounter: Payer: Self-pay | Admitting: Cardiovascular Disease

## 2023-07-05 DIAGNOSIS — R600 Localized edema: Secondary | ICD-10-CM

## 2023-07-05 DIAGNOSIS — I5032 Chronic diastolic (congestive) heart failure: Secondary | ICD-10-CM

## 2023-07-05 NOTE — Telephone Encounter (Signed)
 Called patient to discuss swelling in feet.  Patient reports he has swelling in both feet, left worse than the right. This got worse after his shoulder replacement surgery on 2/28. He also reports swelling in arm, awaiting call from surgeon's office to address this.  Patient states he walks frequently, but does elevate feet when sitting. He is wearing compression stockings, reports this does not seem to help much. He is currently taking toresmide 20mg  daily. Rx is written for twice daily. Advised patient to take torsemide 20mg  twice daily as written by Dr. Eden Emms.  Patient denies any SOB or chest pain. He does report tingling in swollen arm and feet.  Will forward to Dr. Eden Emms and his nurse Elita Quick to follow-up regarding next steps/recommendations.

## 2023-07-12 ENCOUNTER — Ambulatory Visit (HOSPITAL_COMMUNITY)
Admission: RE | Admit: 2023-07-12 | Discharge: 2023-07-12 | Disposition: A | Discharge status: Home / Self Care | Source: Ambulatory Visit | Attending: Vascular Surgery | Admitting: Vascular Surgery

## 2023-07-12 ENCOUNTER — Other Ambulatory Visit (HOSPITAL_COMMUNITY): Payer: Self-pay | Admitting: Physician Assistant

## 2023-07-12 DIAGNOSIS — R600 Localized edema: Secondary | ICD-10-CM

## 2023-07-24 ENCOUNTER — Ambulatory Visit: Admitting: Internal Medicine

## 2023-07-25 ENCOUNTER — Encounter: Payer: Self-pay | Admitting: Internal Medicine

## 2023-07-25 ENCOUNTER — Ambulatory Visit (INDEPENDENT_AMBULATORY_CARE_PROVIDER_SITE_OTHER): Admitting: Internal Medicine

## 2023-07-25 ENCOUNTER — Ambulatory Visit: Payer: Medicare Other | Admitting: Internal Medicine

## 2023-07-25 VITALS — BP 126/64 | HR 101 | Temp 98.2°F | Resp 18 | Ht 68.5 in | Wt 199.4 lb

## 2023-07-25 DIAGNOSIS — D649 Anemia, unspecified: Secondary | ICD-10-CM

## 2023-07-25 DIAGNOSIS — I10 Essential (primary) hypertension: Secondary | ICD-10-CM

## 2023-07-25 DIAGNOSIS — I5032 Chronic diastolic (congestive) heart failure: Secondary | ICD-10-CM

## 2023-07-25 DIAGNOSIS — N184 Chronic kidney disease, stage 4 (severe): Secondary | ICD-10-CM | POA: Diagnosis not present

## 2023-07-25 DIAGNOSIS — R6889 Other general symptoms and signs: Secondary | ICD-10-CM | POA: Diagnosis not present

## 2023-07-25 MED ORDER — TORSEMIDE 20 MG PO TABS: 20.0000 mg | ORAL_TABLET | Freq: Two times a day (BID) | ORAL | Status: DC

## 2023-07-25 NOTE — Patient Instructions (Addendum)
 Increase torsemide 20 mg: 2 tablets in the morning 1 tablet at around 1 PM  Watch your salt intake  Weight yourself daily. If you are not losing weight in the next few days let me know.  Go to the front desk: Arrange for a nurse visit in 1 week. Will do blood work , recheck your blood pressure,  heart rate and weight. Next office visit with me in 2 months

## 2023-07-25 NOTE — Progress Notes (Unsigned)
 Subjective:    Patient ID: Gregory Guerrero, male    DOB: 02-Jan-1946, 78 y.o.   MRN: [id number]  DOS:  07/25/2023 Type of visit - description: Follow-up  Here with his wife. Weight gain noted, edema noted.  Reports that for the last few days has increased torsemide from 1 tablet daily to 1 twice daily. Denies chest pain, no difficulty breathing, no orthopnea.  He reports cold intolerance, particularly he feels: His fingers. Denies any change in the color of the fingers. No fever chills or weight loss.  She is somewhat fatigued and sleeps a lot during the daytime.  No snoring.  Wife is concerned about low hemoglobin, denies nausea vomiting.  No diarrhea or blood in the stools.   Wt Readings from Last 3 Encounters:  07/25/23 199 lb 6 oz (90.4 kg)  06/15/23 187 lb 6.3 oz (85 kg)  06/05/23 189 lb (85.7 kg)     Review of Systems See above   Past Medical History:  Diagnosis Date   Arthritis    CAD (coronary artery disease)    Chronic kidney disease    DVT (deep venous thrombosis) (HCC)    following spinal surgery Nov 2017   HTN (hypertension)    MI (myocardial infarction) (HCC)    1999 ; stents placed    Numbness and tingling in both hands    Spondylosis of cervical spine     Past Surgical History:  Procedure Laterality Date   ANTERIOR CERVICAL DECOMP/DISCECTOMY FUSION N/A 06/27/2017   Procedure: ANTERIOR CERVICAL DECOMPRESSION/DISCECTOMY FUSION C3-4;  Surgeon: Venita Lick, MD;  Location: [facility] OR;  Service: Orthopedics;  Laterality: N/A;  3 hrs   ANTERIOR CERVICAL DECOMP/DISCECTOMY FUSION  01/24/2019   ANTERIOR LAT LUMBAR FUSION N/A 03/01/2016   Procedure: EXTREME LATERAL INTERBODY FUSION  LUMBAR 2-4;  Surgeon: Venita Lick, MD;  Location: [facility] OR;  Service: Orthopedics;  Laterality: N/A;   CARDIAC CATHETERIZATION     20 yrs ago   CARDIOVERSION N/A 07/27/2020   Procedure: CARDIOVERSION;  Surgeon: Wendall Stade, MD;  Location: [facility] ENDOSCOPY;  Service: Cardiovascular;   Laterality: N/A;   CARDIOVERSION N/A 08/05/2021   Procedure: CARDIOVERSION;  Surgeon: Christell Constant, MD;  Location: [facility] ENDOSCOPY;  Service: Cardiovascular;  Laterality: N/A;   JOINT REPLACEMENT     NECK HARDWARE REMOVAL  12/11/2018   RADIOLOGY WITH ANESTHESIA N/A 05/15/2017   Procedure: MRI WITH ANESTHESIA CERVICAL SPINE WITH AND WITHOUT;  Surgeon: Radiologist, Medication, MD;  Location: [facility] OR;  Service: Radiology;  Laterality: N/A;   REVERSE SHOULDER ARTHROPLASTY Right 06/15/2023   Procedure: ARTHROPLASTY, SHOULDER, TOTAL, REVERSE;  Surgeon: Beverely Low, MD;  Location: [facility] ORS;  Service: Orthopedics;  Laterality: Right;  choice with interscalene block   RIGHT HEART CATH N/A 09/28/2021   Procedure: RIGHT HEART CATH;  Surgeon: Dolores Patty, MD;  Location: [facility] INVASIVE CV LAB;  Service: Cardiovascular;  Laterality: N/A;   SPINAL FUSION N/A 03/02/2016   Procedure: POSTERIOR SPINAL FUSION INTERBODY L2-S1, DECOMPRESSION L4-S1;  Surgeon: Venita Lick, MD;  Location: [facility] OR;  Service: Orthopedics;  Laterality: N/A;   TOTAL KNEE ARTHROPLASTY Right 04/30/2017   Procedure: RIGHT TOTAL KNEE ARTHROPLASTY;  Surgeon: Ollen Gross, MD;  Location: [facility] ORS;  Service: Orthopedics;  Laterality: Right;    Current Outpatient Medications  Medication Instructions   acetaminophen (TYLENOL) 650 mg, Every 8 hours PRN   aspirin EC 81 mg, Daily at bedtime   atorvastatin (LIPITOR) 80 MG tablet TAKE 1 TABLET(80MG ) BY MOUTH EVERY  OTHER DAY AND TAKE 1/2 TABLET BY MOUTH ON ALTERNATE DAYS   FIBER PO 1 Dose, Daily   HYDROcodone-acetaminophen (NORCO/VICODIN) 5-325 MG tablet 1 tablet, Oral, Daily PRN   Multiple Vitamin (MULTIVITAMIN WITH MINERALS) TABS tablet 1 tablet, Every morning   Pradaxa 150 mg, Oral, 2 times daily, NEEDS FOLLOW UP APPOINTMENT FOR MORE REFILLS   Tafamidis (VYNDAMAX) 61 MG CAPS Take 1 capsule by mouth daily.   torsemide (DEMADEX) 20 mg, Oral, 2 times daily   vitamin C 1,000 mg, Daily        Objective:   Physical Exam BP 126/64   Pulse (!) 101   Temp 98.2 F (36.8 C) (Oral)   Resp 18   Ht 5' 8.5" (1.74 m)   Wt 199 lb 6 oz (90.4 kg)   SpO2 96%   BMI 29.87 kg/m  General:   Well developed, NAD, BMI noted. HEENT:  Normocephalic . Face symmetric, atraumatic Lungs:  CTA B Normal respiratory effort, no intercostal retractions, no accessory muscle use. Heart: Slightly tachycardic, irregularly irregular  lower extremities: ++/+++ pretibial edema bilaterally  Skin: Not pale. Not jaundice Neurologic:  alert & oriented X3.  Speech normal, gait appropriate for age and unassisted Psych--  Cognition and judgment appear intact.  Cooperative with normal attention span and concentration.  Behavior appropriate. No anxious or depressed appearing.      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  Cervical myelopathy: Surgery 06/27/2017: Anterior cervical decompression and discectomy with fusion of C3-C4  PLAN  CAD, PAF, diastolic CHF, cardiac amyloidosis:  The patient has increasing lower extremity edema and weight gain.  No difficulty breathing or chest pain. He reach out to cardiology 07/05/2023 and recommended Demadex twice daily. Apparently that has not helped much. Plan: Increase temporarily Demadex to 20 in the morning and 1 in the afternoon. Monitor weight, low-salt. Nurse visit in 1 week for BP -heart rate -weight check;  blood work (BNP, BMP, magnesium, CBC, iron panel, TSH). HTN: BP looks good today, no ambulatory BPs.  CKD, stage III: Saw  nephrology 06/13/2023 Cold intolerance, anemia: Patient and wife are concerned about cold intolerance and anemia.  No GI symptoms.  Will get CBC, iron panel and TSH. 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  Had R shoulder surgery several weeks ago.  Complained of moderate to severe decreased ROM neck. RTC nurse visit 1 week RTC with me in 2 months            11 HTN: BP is very good today, on Demadex. Dyslipidemia: Controlled on atorvastatin CAD, CHF, A-fib,Cardiac amyloidosis: LOV cardiology 08/2022, note reviewed. Patient noted to be in A-fib, discontinue amiodarone due to side effects and lack of efficacy.  Anticoagulated.  No further attempts for cardioversion. Next visit 6 months CKD: Saw nephrology 01/31/2023.  Felt to be stable. Creatinine 1.7, potassium 4.7, LFTs normal.  Hemoglobin 12.2. Currently not taking [employer]. Elected to have a flu shot at a later time. COVID-vaccine recommended, strongly declined. RTC 4 to 5 months

## 2023-07-26 NOTE — Assessment & Plan Note (Signed)
 CAD, PAF, diastolic CHF, cardiac amyloidosis:  The patient has increasing lower extremity edema and weight gain.  No difficulty breathing or chest pain. He reach out to cardiology 07/05/2023 and recommended Demadex twice daily. Apparently that has not helped much. Plan: Increase temporarily Demadex to 2 tabs in the morning and 1 in the afternoon. Monitor weight, low-salt. Nurse visit in 1 week for BP -heart rate -weight check;  blood work (BNP, BMP, magnesium, CBC, iron panel, TSH). HTN: BP looks good today, no ambulatory BPs. CKD, stage III: Saw nephrology 06/13/2023 Cold intolerance, anemia: Patient and wife are concerned about cold intolerance and anemia.  No GI symptoms.  Will get CBC, iron panel and TSH. 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  Had R shoulder surgery several weeks ago.  Complained of moderate to severe decreased ROM neck. RTC nurse visit 1 week RTC with me in 2 months

## 2023-07-31 ENCOUNTER — Other Ambulatory Visit: Payer: Self-pay

## 2023-07-31 ENCOUNTER — Ambulatory Visit (INDEPENDENT_AMBULATORY_CARE_PROVIDER_SITE_OTHER)

## 2023-07-31 ENCOUNTER — Other Ambulatory Visit (INDEPENDENT_AMBULATORY_CARE_PROVIDER_SITE_OTHER)

## 2023-07-31 VITALS — BP 118/78 | HR 95 | Wt 190.3 lb

## 2023-07-31 DIAGNOSIS — Z4789 Encounter for other orthopedic aftercare: Secondary | ICD-10-CM | POA: Diagnosis not present

## 2023-07-31 DIAGNOSIS — I5032 Chronic diastolic (congestive) heart failure: Secondary | ICD-10-CM

## 2023-07-31 DIAGNOSIS — D649 Anemia, unspecified: Secondary | ICD-10-CM

## 2023-07-31 DIAGNOSIS — I10 Essential (primary) hypertension: Secondary | ICD-10-CM

## 2023-07-31 DIAGNOSIS — N184 Chronic kidney disease, stage 4 (severe): Secondary | ICD-10-CM

## 2023-07-31 LAB — BASIC METABOLIC PANEL WITH GFR
BUN: 59 mg/dL — ABNORMAL HIGH (ref 6–23)
CO2: 28 meq/L (ref 19–32)
Calcium: 9 mg/dL (ref 8.4–10.5)
Chloride: 104 meq/L (ref 96–112)
Creatinine, Ser: 2.04 mg/dL — ABNORMAL HIGH (ref 0.40–1.50)
GFR: 30.44 mL/min — ABNORMAL LOW (ref >60.00)
Glucose, Bld: 93 mg/dL (ref 70–99)
Potassium: 4.5 meq/L (ref 3.5–5.1)
Sodium: 141 meq/L (ref 135–145)

## 2023-07-31 LAB — CBC WITH DIFFERENTIAL/PLATELET
Basophils Absolute: 0 10*3/uL (ref 0.0–0.1)
Basophils Relative: 0.6 % (ref 0.0–3.0)
Eosinophils Absolute: 0.1 10*3/uL (ref 0.0–0.7)
Eosinophils Relative: 1.9 % (ref 0.0–5.0)
HCT: 28.7 % — ABNORMAL LOW (ref 39.0–52.0)
Hemoglobin: 9.1 g/dL — ABNORMAL LOW (ref 13.0–17.0)
Lymphocytes Relative: 19.7 % (ref 12.0–46.0)
Lymphs Abs: 1.3 10*3/uL (ref 0.7–4.0)
MCHC: 31.8 g/dL (ref 30.0–36.0)
MCV: 96.2 fl (ref 78.0–100.0)
Monocytes Absolute: 0.8 10*3/uL (ref 0.1–1.0)
Monocytes Relative: 12.5 % — ABNORMAL HIGH (ref 3.0–12.0)
Neutro Abs: 4.2 10*3/uL (ref 1.4–7.7)
Neutrophils Relative %: 65.3 % (ref 43.0–77.0)
Platelets: 189 10*3/uL (ref 150.0–400.0)
RBC: 2.98 Mil/uL — ABNORMAL LOW (ref 4.22–5.81)
RDW: 16.7 % — ABNORMAL HIGH (ref 11.5–15.5)
WBC: 6.5 10*3/uL (ref 4.0–10.5)

## 2023-07-31 LAB — MAGNESIUM: Magnesium: 2.4 mg/dL (ref 1.5–2.5)

## 2023-07-31 LAB — IBC + FERRITIN
Ferritin: 27.5 ng/mL (ref 22.0–322.0)
Iron: 39 ug/dL — ABNORMAL LOW (ref 42–165)
Saturation Ratios: 10.3 % — ABNORMAL LOW (ref 20.0–50.0)
TIBC: 379.4 ug/dL (ref 250.0–450.0)
Transferrin: 271 mg/dL (ref 212.0–360.0)

## 2023-07-31 LAB — BRAIN NATRIURETIC PEPTIDE: Pro B Natriuretic peptide (BNP): 482 pg/mL — ABNORMAL HIGH (ref 0.0–100.0)

## 2023-07-31 LAB — TSH: TSH: 2.68 u[IU]/mL (ref 0.35–5.50)

## 2023-07-31 NOTE — Progress Notes (Signed)
 Pt here for Blood pressure check per Dr Neomi Banks: 07/25/23 "Increase temporarily Demadex to 2 tabs in the morning and 1 in the afternoon. Monitor weight, low-salt. Nurse visit in 1 week for BP -heart rate -weight check;  blood work (BNP, BMP, magnesium, CBC, iron panel, TSH)."  Pt currently takes: Demadex 20 mg 2 tabs in the Am and 1 at around 1pm Pt reports compliance with medication.  BP today @ = 118/78 HR = 95 Weight= 190.3 lb, weight has improved since last OV- but edema is unchanged.  (Labs done today as well)   Pt checks his weight daily, has not checked BP.   Pt advised per Dr Neomi Banks: Although edema has not improved much since last OV, his weight has improved. We will wait for labs to return before making any medication changes. Pt aware and voices understanding.

## 2023-08-01 ENCOUNTER — Telehealth: Payer: Self-pay | Admitting: Internal Medicine

## 2023-08-01 ENCOUNTER — Ambulatory Visit

## 2023-08-01 ENCOUNTER — Other Ambulatory Visit

## 2023-08-01 DIAGNOSIS — D649 Anemia, unspecified: Secondary | ICD-10-CM

## 2023-08-01 NOTE — Telephone Encounter (Signed)
Spoke w/ Pt- informed of results and recommendations. Pt verbalized understanding.  

## 2023-08-01 NOTE — Telephone Encounter (Signed)
 Order placed for guaiac 3 stool testing. Cards mailed to Pt.

## 2023-08-01 NOTE — Telephone Encounter (Addendum)
 Follow-up from previous visit. Had a nurse visit yesterday, BP was okay, weight was down 9 pounds with  increase of diuretics. Reportedly edema was about the same. Results show slightly decreased hemoglobin (history of CKD, anticoagulated, no GI symptoms), increased creatinine, BNP at baseline, ferritin okay, iron slightly low. Please call patient: Go back to only 2 tablets of Demadex  in the morning, low-salt diet, leg elevation an hour twice daily to help edema. Hemoglobin has decreased, watch for stomach symptoms such as diarrhea or blood in the stools.  Provide 3 Hemoccults.

## 2023-08-03 NOTE — Telephone Encounter (Signed)
Lm to call back ./cy 

## 2023-08-06 NOTE — Telephone Encounter (Signed)
 Left message for patient to call back

## 2023-08-07 NOTE — Telephone Encounter (Signed)
 Pt return call to a nurse

## 2023-08-07 NOTE — Telephone Encounter (Signed)
 Loyde Rule, MD to Me    08/07/23  1:56 PM  Can try would see if he can get CHF clinic appointment   Called patient's wife and informed her of Dr. Francie Irani advisement. Will send message to CHF clinic to see if they can get patient in to see them.

## 2023-08-07 NOTE — Telephone Encounter (Signed)
 Called patient's wife back. Patient is going to start taking Demadex  40 mg in AM and 20 mg in PM. He will get lab work done on 08/28/23. Patient's wife was wondering if he would wear lymphedema boots that go from the foot to the groined. They pump up like SCDs. Will see if Dr. Stann Earnest is okay with patient using boots or if it would even be beneficial.

## 2023-08-07 NOTE — Telephone Encounter (Signed)
 Please call [phone]

## 2023-08-09 ENCOUNTER — Telehealth (HOSPITAL_COMMUNITY): Payer: Self-pay | Admitting: Internal Medicine

## 2023-08-09 DIAGNOSIS — Z789 Other specified health status: Secondary | ICD-10-CM | POA: Diagnosis not present

## 2023-08-09 DIAGNOSIS — R29898 Other symptoms and signs involving the musculoskeletal system: Secondary | ICD-10-CM | POA: Diagnosis not present

## 2023-08-09 DIAGNOSIS — M25611 Stiffness of right shoulder, not elsewhere classified: Secondary | ICD-10-CM | POA: Diagnosis not present

## 2023-08-09 DIAGNOSIS — Z96611 Presence of right artificial shoulder joint: Secondary | ICD-10-CM | POA: Diagnosis not present

## 2023-08-09 NOTE — Telephone Encounter (Signed)
 Called to confirm/remind patient of their appointment at the [facility] on 08/09/23.   Appointment:   [x] Confirmed  [] Left mess   [] No answer/No voice mail  [] VM Full/unable to leave message  [] Phone not in service  Patient reminded to bring all medications and/or complete list.  Confirmed patient has transportation. Gave directions, instructed to utilize valet parking.

## 2023-08-10 ENCOUNTER — Encounter (HOSPITAL_COMMUNITY): Payer: Self-pay | Admitting: Internal Medicine

## 2023-08-10 ENCOUNTER — Ambulatory Visit (HOSPITAL_COMMUNITY)
Admission: RE | Admit: 2023-08-10 | Discharge: 2023-08-10 | Disposition: A | Discharge status: Home / Self Care | Source: Ambulatory Visit | Attending: Internal Medicine | Admitting: Internal Medicine

## 2023-08-10 VITALS — BP 128/80 | HR 100 | Ht 68.5 in | Wt 198.0 lb

## 2023-08-10 DIAGNOSIS — Z7901 Long term (current) use of anticoagulants: Secondary | ICD-10-CM | POA: Insufficient documentation

## 2023-08-10 DIAGNOSIS — I43 Cardiomyopathy in diseases classified elsewhere: Secondary | ICD-10-CM

## 2023-08-10 DIAGNOSIS — Z87891 Personal history of nicotine dependence: Secondary | ICD-10-CM | POA: Diagnosis not present

## 2023-08-10 DIAGNOSIS — Z86718 Personal history of other venous thrombosis and embolism: Secondary | ICD-10-CM | POA: Diagnosis not present

## 2023-08-10 DIAGNOSIS — I130 Hypertensive heart and chronic kidney disease with heart failure and stage 1 through stage 4 chronic kidney disease, or unspecified chronic kidney disease: Secondary | ICD-10-CM | POA: Diagnosis not present

## 2023-08-10 DIAGNOSIS — E854 Organ-limited amyloidosis: Secondary | ICD-10-CM | POA: Diagnosis not present

## 2023-08-10 DIAGNOSIS — I2510 Atherosclerotic heart disease of native coronary artery without angina pectoris: Secondary | ICD-10-CM | POA: Insufficient documentation

## 2023-08-10 DIAGNOSIS — I5033 Acute on chronic diastolic (congestive) heart failure: Secondary | ICD-10-CM

## 2023-08-10 DIAGNOSIS — Z955 Presence of coronary angioplasty implant and graft: Secondary | ICD-10-CM | POA: Diagnosis not present

## 2023-08-10 DIAGNOSIS — I482 Chronic atrial fibrillation, unspecified: Secondary | ICD-10-CM | POA: Insufficient documentation

## 2023-08-10 DIAGNOSIS — Z79899 Other long term (current) drug therapy: Secondary | ICD-10-CM | POA: Insufficient documentation

## 2023-08-10 DIAGNOSIS — N184 Chronic kidney disease, stage 4 (severe): Secondary | ICD-10-CM | POA: Insufficient documentation

## 2023-08-10 DIAGNOSIS — Z7902 Long term (current) use of antithrombotics/antiplatelets: Secondary | ICD-10-CM | POA: Insufficient documentation

## 2023-08-10 MED ORDER — TORSEMIDE 20 MG PO TABS: 60.0000 mg | ORAL_TABLET | Freq: Every day | ORAL | 3 refills | Status: DC

## 2023-08-10 MED ORDER — POTASSIUM CHLORIDE CRYS ER 20 MEQ PO TBCR: 40.0000 meq | EXTENDED_RELEASE_TABLET | ORAL | 1 refills | Status: DC

## 2023-08-10 MED ORDER — METOLAZONE 2.5 MG PO TABS: 2.5000 mg | ORAL_TABLET | ORAL | 1 refills | Status: DC

## 2023-08-10 NOTE — Progress Notes (Signed)
 AD[facility]ANCED [facility] CLIN[facility] CONSULT NOTE  Referring Physician: Dr. Stann Earnest  Primary Care: Ezell Hollow, MD Primary Cardiologist: Janelle Mediate, MD   Chief complaint: Heart failure  HPI:  78 yo male with CAD s/p remote stenting of LAD in [place], previous DVT, HTN, CKD IV, PAF and diastolic HF. REferred by Dr. 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   Has been followed by Dr. Stann Earnest. Brief cardiac history includes:   Echo 02/01/17 EF 50% grade 2 diastolic mild MR  ETT 02/23/17 normal    Noted new onset afib 07/27/20/ Started on renal dose xarelto  15 mg daily. -> DCCv 07/27/20 with conversion to NSR    In 07/22/21 recurrent AF/AFL  -> Had successful repeat [facility] on 08/05/21 Done on amiodarone  200 mg daily and pradaxa  150 mg bi.  Following with Dr. Marven Slimmer for possible Watchman  Referred to me for RHC in 6/23 due to LE edema. RHC with prominent v-waves in PCWP tracing suspicious for MR vs severe diastolic dysfunction. TTE with mild MR. 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   RHC 09/28/21   RA = 7 RV = 48/8 PA = 52/16 (34) PCW = 22 (v = 40) Fick cardiac output/index = 4.1/2.0 PVR = 3.2 FA sat = 97% PA sat = 61%, 58%  Echo 6/23 EF 60% strain with cherry on top. RV low normal Mild MR  PYP 10/13/21  markedly positive    cMRI 10/20/21: EF 54% RV 51% Diffuse LGE with ECV 50%  Saw Dr. Maria Shiner Serum light chains mildly elevated. Urine IFE normal . Felt not to have light chain amyloidosis. BMBx negatvie  Here with his wife for acute visit due to volume overload. I have not seen him since 2023. Remains on tafamadis.  Says legs has been swelling for months but worse recently. Now having blisters. Supposed to be taking torsemide  40/20 but not taking regularly because he says it makes him be too much. Denies CP, SOB, orthopnea or PND.     Past Medical History:  Diagnosis Date   Arthritis    CAD (coronary artery disease)    Chronic kidney disease    DVT (deep venous thrombosis) (HCC)     following spinal surgery Nov 2017   HTN (hypertension)    MI (myocardial infarction) (HCC)    1999 ; stents placed    Numbness and tingling in both hands    Spondylosis of cervical spine     Current Outpatient Medications  Medication Sig Dispense Refill   acetaminophen  (TYLENOL ) 650 MG CR tablet Take 650 mg by mouth every 8 (eight) hours as needed for pain.     Ascorbic Acid (VITAMIN C) 1000 MG tablet Take 1,000 mg by mouth daily.     aspirin  EC 81 MG tablet Take 81 mg by mouth at bedtime.     atorvastatin  (LIPITOR) 80 MG tablet TAKE 1 TABLET(80MG ) BY MOUTH EVERY OTHER DAY AND TAKE 1/2 TABLET BY MOUTH ON ALTERNATE DAYS 90 tablet 3   FIBER PO Take 1 Dose by mouth daily.     Multiple Vitamin (MULTIVITAMIN WITH MINERALS) TABS tablet Take 1 tablet by mouth in the morning.     PRADAXA  150 MG CAPS capsule Take 1 capsule (150 mg total) by mouth 2 (two) times daily. NEEDS FOLLOW UP APPOINTMENT FOR MORE REFILLS 60 capsule 0   Tafamidis  (VYNDAMAX ) 61 MG CAPS Take 1 capsule by mouth daily. 90 capsule 3   torsemide  (DEMADEX ) 20 MG tablet Take 20 mg by mouth  3 (three) times daily.     No current facility-administered medications for this encounter.    No Known Allergies    Social History   Socioeconomic History   Marital status: Married    Spouse name: Not on file   Number of children: 2   Years of education: Not on file   Highest education level: Not on file  Occupational History   Occupation: retired , Nature conservation officer busines   Tobacco Use   Smoking status: Former    Current packs/day: 0.00    Types: Cigarettes    Quit date: 04/26/1983    Years since quitting: 40.3   Smokeless tobacco: Never   Tobacco comments:    quit over 40 years ago   Vaping Use   Vaping status: Never Used  Substance and Sexual Activity   Alcohol use: No   Drug use: No   Sexual activity: Never  Other Topics Concern   Not on file  Social History Narrative   Household pt, wife    [place] from [place] 2011    Social  Drivers of Health   Financial Resource Strain: Low Risk  (02/03/2022)   Overall Financial Resource Strain (CARDIA)    Difficulty of Paying Living Expenses: Not hard at all  Food Insecurity: No Food Insecurity (02/03/2022)   Hunger Vital Sign    Worried About Running Out of Food in the Last Year: Never true    Ran Out of Food in the Last Year: Never true  Transportation Needs: No Transportation Needs (02/03/2022)   PRAPARE - Administrator, Civil Service (Medical): No    Lack of Transportation (Non-Medical): No  Physical Activity: Inactive (02/03/2022)   Exercise Vital Sign    Days of Exercise per Week: 0 days    Minutes of Exercise per Session: 0 min  Stress: No Stress Concern Present (02/03/2022)   [employer] of Occupational Health - Occupational Stress Questionnaire    Feeling of Stress : Not at all  Social Connections: Unknown (08/27/2022)   Received from [facility], [facility]   Social Network    Social Network: Not on file  Intimate Partner Violence: Unknown (08/27/2022)   Received from [facility], [facility]   HITS    Physically Hurt: Not on file    Insult or Talk Down To: Not on file    Threaten Physical Harm: Not on file    Scream or Curse: Not on file      Family History  Problem Relation Age of Onset   Cancer - Other Mother    Heart attack Father    Uterine cancer Sister    Colon cancer Neg Hx    Prostate cancer Neg Hx     Vitals:   08/10/23 1141  BP: 128/80  Pulse: 100  SpO2: 96%  Weight: 89.8 kg (198 lb)  Height: 5' 8.5" (1.74 m)   Wt Readings from Last 3 Encounters:  08/10/23 89.8 kg (198 lb)  07/31/23 86.3 kg (190 lb 4.8 oz)  07/25/23 90.4 kg (199 lb 6 oz)     PHYSICAL EXAM: General:  Sitting in chair. No resp difficulty HEENT: normal Neck: supple. JVP yo jaw Carotids 2+ bilat; no bruits. No lymphadenopathy or thryomegaly appreciated. Cor: PMI nondisplaced. Irregular rate & rhythm. No rubs, gallops or  murmurs. Lungs: clear Abdomen: soft, nontender, nondistended. No hepatosplenomegaly. No bruits or masses. Good bowel sounds. Extremities: no cyanosis, clubbing, rash, 3-4+ edema from knees down. + erythema and blisters Neuro:  alert & orientedx3, cranial nerves grossly intact. moves all 4 extremities w/o difficulty. Affect pleasant   ECG: AF 91 Personally reviewed   ASSESSMENT & PLAN:   1. 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 RV low normal Mild MR - PYP 10/13/21  markedly positive  - cMRI 10/20/21: EF 54% RV 51% Diffuse LGE with ECV 50% - Remains on tafamadis (started 2023) - NYHA I-II with mostly R-sided symptoms and marked LE edema  -Discussed possible admission as he has to leave for [place] next Thursday to attend his Grandaughter's wedding but he is reluctant - Will use torsemide  60 daily + metolazone 2.5 daily and KC 40 for 3 days - I will see back Monday with echo   2. Chronic AF -  in setting of cardiac amyloid suspect AF is contributing to HF but I went back through his ECGs and has been in AF since at least 1/24 so would be difficult candidate for rhythm control. Will follow. If DC-CV contemplated would need amio - on Xarelto  - not candidate for Watchman due to amyloid  3. CKD IV - SCr 2.5-3.0 in past - Most recent Scr 2.04 on 07/31/23 - recheck on Monday after diuresis  I spent a total of 45 minutes today: 1) reviewing the patient's medical records including previous charts, labs and recent notes from other providers; 2) examining the patient and counseling them on their medical issues/explaining the plan of care; 3) adjusting meds as needed and 4) ordering lab work or other needed tests.     Jules Oar, MD  12:16 PM

## 2023-08-10 NOTE — Patient Instructions (Signed)
 Medication Changes:  INCREASE Torsemide  to 60 mg (3 tabs) Daily  START Metolazone 2.5 mg Daily  START Potassium 40 meq (2 tabs) Daily  Testing/Procedures:  Your physician has requested that you have an echocardiogram. Echocardiography is a painless test that uses sound waves to create images of your heart. It provides your doctor with information about the size and shape of your heart and how well your heart's chambers and valves are working. This procedure takes approximately one hour. There are no restrictions for this procedure. Please do NOT wear cologne, perfume, aftershave, or lotions (deodorant is allowed). Please arrive 15 minutes prior to your appointment time.  Please note: We ask at that you not bring children with you during ultrasound (echo/ vascular) testing. Due to room size and safety concerns, children are not allowed in the ultrasound rooms during exams. Our front office staff cannot provide observation of children in our lobby area while testing is being conducted. An adult accompanying a patient to their appointment will only be allowed in the ultrasound room at the discretion of the ultrasound technician under special circumstances. We apologize for any inconvenience.   Special Instructions // Education:  Do the following things EVERYDAY: Weigh yourself in the morning before breakfast. Write it down and keep it in a log. Take your medicines as prescribed Eat low salt foods--Limit salt (sodium) to 2000 mg per day.  Stay as active as you can everyday Limit all fluids for the day to less than 2 liters   Follow-Up in: 3 days Monday 08/13/23   At the [facility], you and your health needs are our priority. We have a designated team specialized in the treatment of Heart Failure. This Care Team includes your primary Heart Failure Specialized Cardiologist (physician), Advanced Practice Providers (APPs- Physician Assistants and Nurse Practitioners), and  Pharmacist who all work together to provide you with the care you need, when you need it.   You may see any of the following providers on your designated Care Team at your next follow up:  Dr. Jules Oar Dr. Peder Bourdon Dr. Alwin Baars Dr. Judyth Nunnery Nieves Bars, NP Ruddy Corral, [place] [facility] [place], [place] Dennise Fitz, NP [place] Lee, NP Luster Salters, PharmD   Please be sure to bring in all your medications bottles to every appointment.   Need to Contact Us :  If you have any questions or concerns before your next appointment please send us  a message through [place] or call our office at [phone].    TO LEAVE A MESSAGE FOR THE NURSE SELECT OPTION 2, PLEASE LEAVE A MESSAGE INCLUDING: YOUR NAME DATE OF BIRTH CALL BACK NUMBER REASON FOR CALL**this is important as we prioritize the call backs  YOU WILL RECEIVE A CALL BACK THE SAME DAY AS LONG AS YOU CALL BEFORE 4:00 PM

## 2023-08-12 DIAGNOSIS — R040 Epistaxis: Secondary | ICD-10-CM | POA: Diagnosis not present

## 2023-08-13 ENCOUNTER — Telehealth: Payer: Self-pay

## 2023-08-13 ENCOUNTER — Telehealth (HOSPITAL_COMMUNITY): Payer: Self-pay | Admitting: Pharmacist

## 2023-08-13 ENCOUNTER — Encounter (HOSPITAL_COMMUNITY): Payer: Self-pay | Admitting: Internal Medicine

## 2023-08-13 ENCOUNTER — Ambulatory Visit (HOSPITAL_COMMUNITY)
Admission: RE | Admit: 2023-08-13 | Discharge: 2023-08-13 | Disposition: A | Discharge status: Home / Self Care | Source: Ambulatory Visit | Attending: Internal Medicine | Admitting: Internal Medicine

## 2023-08-13 ENCOUNTER — Other Ambulatory Visit (HOSPITAL_COMMUNITY): Payer: Self-pay

## 2023-08-13 ENCOUNTER — Ambulatory Visit (HOSPITAL_BASED_OUTPATIENT_CLINIC_OR_DEPARTMENT_OTHER)
Admission: RE | Admit: 2023-08-13 | Discharge: 2023-08-13 | Disposition: A | Discharge status: Home / Self Care | Source: Ambulatory Visit | Attending: Internal Medicine | Admitting: Internal Medicine

## 2023-08-13 VITALS — BP 110/70 | HR 91 | Wt 186.4 lb

## 2023-08-13 DIAGNOSIS — I5022 Chronic systolic (congestive) heart failure: Secondary | ICD-10-CM | POA: Diagnosis not present

## 2023-08-13 DIAGNOSIS — I5032 Chronic diastolic (congestive) heart failure: Secondary | ICD-10-CM

## 2023-08-13 DIAGNOSIS — Z86718 Personal history of other venous thrombosis and embolism: Secondary | ICD-10-CM | POA: Insufficient documentation

## 2023-08-13 DIAGNOSIS — Z955 Presence of coronary angioplasty implant and graft: Secondary | ICD-10-CM | POA: Insufficient documentation

## 2023-08-13 DIAGNOSIS — Z79899 Other long term (current) drug therapy: Secondary | ICD-10-CM | POA: Diagnosis not present

## 2023-08-13 DIAGNOSIS — I2510 Atherosclerotic heart disease of native coronary artery without angina pectoris: Secondary | ICD-10-CM | POA: Diagnosis not present

## 2023-08-13 DIAGNOSIS — Z87891 Personal history of nicotine dependence: Secondary | ICD-10-CM | POA: Insufficient documentation

## 2023-08-13 DIAGNOSIS — E854 Organ-limited amyloidosis: Secondary | ICD-10-CM | POA: Diagnosis not present

## 2023-08-13 DIAGNOSIS — Z7982 Long term (current) use of aspirin: Secondary | ICD-10-CM | POA: Diagnosis not present

## 2023-08-13 DIAGNOSIS — I43 Cardiomyopathy in diseases classified elsewhere: Secondary | ICD-10-CM | POA: Insufficient documentation

## 2023-08-13 DIAGNOSIS — I5189 Other ill-defined heart diseases: Secondary | ICD-10-CM

## 2023-08-13 DIAGNOSIS — I130 Hypertensive heart and chronic kidney disease with heart failure and stage 1 through stage 4 chronic kidney disease, or unspecified chronic kidney disease: Secondary | ICD-10-CM | POA: Diagnosis not present

## 2023-08-13 DIAGNOSIS — I480 Paroxysmal atrial fibrillation: Secondary | ICD-10-CM | POA: Insufficient documentation

## 2023-08-13 DIAGNOSIS — I252 Old myocardial infarction: Secondary | ICD-10-CM | POA: Insufficient documentation

## 2023-08-13 DIAGNOSIS — Z7901 Long term (current) use of anticoagulants: Secondary | ICD-10-CM | POA: Diagnosis not present

## 2023-08-13 DIAGNOSIS — Z7902 Long term (current) use of antithrombotics/antiplatelets: Secondary | ICD-10-CM | POA: Diagnosis not present

## 2023-08-13 DIAGNOSIS — N184 Chronic kidney disease, stage 4 (severe): Secondary | ICD-10-CM | POA: Diagnosis not present

## 2023-08-13 DIAGNOSIS — Z8249 Family history of ischemic heart disease and other diseases of the circulatory system: Secondary | ICD-10-CM | POA: Insufficient documentation

## 2023-08-13 DIAGNOSIS — I5033 Acute on chronic diastolic (congestive) heart failure: Secondary | ICD-10-CM | POA: Diagnosis not present

## 2023-08-13 DIAGNOSIS — I482 Chronic atrial fibrillation, unspecified: Secondary | ICD-10-CM | POA: Diagnosis not present

## 2023-08-13 DIAGNOSIS — I4819 Other persistent atrial fibrillation: Secondary | ICD-10-CM | POA: Diagnosis not present

## 2023-08-13 LAB — ECHOCARDIOGRAM COMPLETE
AR max vel: 2.83 cm2
AV Area VTI: 3.12 cm2
AV Area mean vel: 2.61 cm2
AV Mean grad: 2 mmHg
AV Peak grad: 3 mmHg
Ao pk vel: 0.87 m/s
Area-P 1/2: 4.46 cm2
S' Lateral: 3.7 cm

## 2023-08-13 LAB — BASIC METABOLIC PANEL WITH GFR
Anion gap: 11 (ref 5–15)
BUN: 60 mg/dL — ABNORMAL HIGH (ref 8–23)
CO2: 27 mmol/L (ref 22–32)
Calcium: 9.6 mg/dL (ref 8.9–10.3)
Chloride: 100 mmol/L (ref 98–111)
Creatinine, Ser: 2.48 mg/dL — ABNORMAL HIGH (ref 0.61–1.24)
GFR, Estimated: 26 mL/min — ABNORMAL LOW (ref >60)
Glucose, Bld: 102 mg/dL — ABNORMAL HIGH (ref 70–99)
Potassium: 4.5 mmol/L (ref 3.5–5.1)
Sodium: 138 mmol/L (ref 135–145)

## 2023-08-13 LAB — BRAIN NATRIURETIC PEPTIDE: B Natriuretic Peptide: 428.7 pg/mL — ABNORMAL HIGH (ref 0.0–100.0)

## 2023-08-13 MED ORDER — TORSEMIDE 20 MG PO TABS: 80.0000 mg | ORAL_TABLET | Freq: Every day | ORAL | Status: DC

## 2023-08-13 MED ORDER — FUROSCIX 80 MG/10ML ~~LOC~~ CTKT: 80.0000 mg | CARTRIDGE | SUBCUTANEOUS | Status: DC

## 2023-08-13 NOTE — Addendum Note (Signed)
 Encounter addended by: Glorietta Lark, RN on: 08/13/2023 2:27 PM  Actions taken: Clinical Note Signed

## 2023-08-13 NOTE — Progress Notes (Signed)
 ADVANCED HF CLINIC NOTE  Referring Physician: Dr. Stann Earnest  Primary Care: Ezell Hollow, MD Primary Cardiologist: Janelle Mediate, MD   Chief complaint: Heart failure  HPI:  78 yo male with CAD s/p remote stenting of LAD in [place], previous DVT, HTN, CKD IV, PAF and diastolic HF. REferred by Dr. 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   Has been followed by Dr. Stann Earnest. Brief cardiac history includes:   Echo 02/01/17 EF 50% grade 2 diastolic mild MR  ETT 02/23/17 normal    Noted new onset afib 07/27/20/ Started on renal dose xarelto  15 mg daily. -> DCCv 07/27/20 with conversion to NSR    In 07/22/21 recurrent AF/AFL  -> Had successful repeat [facility] on 08/05/21 Done on amiodarone  200 mg daily and pradaxa  150 mg bi.  Following with Dr. Marven Slimmer for possible Watchman  Referred to me for RHC in 6/23 due to LE edema. RHC with prominent v-waves in PCWP tracing suspicious for MR vs severe diastolic dysfunction. TTE with mild MR. 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   RHC 09/28/21   RA = 7 RV = 48/8 PA = 52/16 (34) PCW = 22 (v = 40) Fick cardiac output/index = 4.1/2.0 PVR = 3.2 FA sat = 97% PA sat = 61%, 58%  Echo 6/23 EF 60% strain with cherry on top. RV low normal Mild MR  PYP 10/13/21  markedly positive    cMRI 10/20/21: EF 54% RV 51% Diffuse LGE with ECV 50%  Saw Dr. Maria Shiner Serum light chains mildly elevated. Urine IFE normal . Felt not to have light chain amyloidosis. BMBx negatvie  I saw him last week for an acute visit due to volume overload. I hd not seen him since 2023. He was markedly volume overloaded. We increased torsemide  to 60 daily and added metolazone for 3 days. Says urine output picked up but not impressively  Weight down 12 pounds. Over the weekend had epistaxis and went to ED. Nose packed. Having tingling in hands/feet.  Echo today EF 40-45% severe LV thickening. RV moderate HK severe RAE. Personally reviewed    Past Medical History:  Diagnosis Date    Arthritis    CAD (coronary artery disease)    Chronic kidney disease    DVT (deep venous thrombosis) (HCC)    following spinal surgery Nov 2017   HTN (hypertension)    MI (myocardial infarction) (HCC)    1999 ; stents placed    Numbness and tingling in both hands    Spondylosis of cervical spine     Current Outpatient Medications  Medication Sig Dispense Refill   acetaminophen  (TYLENOL ) 650 MG CR tablet Take 650 mg by mouth every 8 (eight) hours as needed for pain.     Ascorbic Acid (VITAMIN C) 1000 MG tablet Take 1,000 mg by mouth daily.     aspirin  EC 81 MG tablet Take 81 mg by mouth at bedtime.     atorvastatin  (LIPITOR) 80 MG tablet TAKE 1 TABLET(80MG ) BY MOUTH EVERY OTHER DAY AND TAKE 1/2 TABLET BY MOUTH ON ALTERNATE DAYS 90 tablet 3   FIBER PO Take 1 Dose by mouth daily.     metolazone (ZAROXOLYN) 2.5 MG tablet Take 1 tablet (2.5 mg total) by mouth as directed. 5 tablet 1   Multiple Vitamin (MULTIVITAMIN WITH MINERALS) TABS tablet Take 1 tablet by mouth in the morning.     potassium chloride  SA (KLOR-CON  M) 20 MEQ tablet Take 2 tablets (40 mEq total) by mouth as  directed. When you take Metolazone 10 tablet 1   PRADAXA  150 MG CAPS capsule Take 1 capsule (150 mg total) by mouth 2 (two) times daily. NEEDS FOLLOW UP APPOINTMENT FOR MORE REFILLS 60 capsule 0   Tafamidis  (VYNDAMAX ) 61 MG CAPS Take 1 capsule by mouth daily. 90 capsule 3   torsemide  (DEMADEX ) 20 MG tablet Take 3 tablets (60 mg total) by mouth daily. 90 tablet 3   No current facility-administered medications for this encounter.    No Known Allergies    Social History   Socioeconomic History   Marital status: Married    Spouse name: Not on file   Number of children: 2   Years of education: Not on file   Highest education level: Not on file  Occupational History   Occupation: retired , Nature conservation officer busines   Tobacco Use   Smoking status: Former    Current packs/day: 0.00    Types: Cigarettes    Quit date:  04/26/1983    Years since quitting: 40.3   Smokeless tobacco: Never   Tobacco comments:    quit over 40 years ago   Vaping Use   Vaping status: Never Used  Substance and Sexual Activity   Alcohol use: No   Drug use: No   Sexual activity: Never  Other Topics Concern   Not on file  Social History Narrative   Household pt, wife    [place] from [place] 2011    Social Drivers of Health   Financial Resource Strain: Low Risk  (02/03/2022)   Overall Financial Resource Strain (CARDIA)    Difficulty of Paying Living Expenses: Not hard at all  Food Insecurity: No Food Insecurity (02/03/2022)   Hunger Vital Sign    Worried About Running Out of Food in the Last Year: Never true    Ran Out of Food in the Last Year: Never true  Transportation Needs: No Transportation Needs (02/03/2022)   PRAPARE - Administrator, Civil Service (Medical): No    Lack of Transportation (Non-Medical): No  Physical Activity: Inactive (02/03/2022)   Exercise Vital Sign    Days of Exercise per Week: 0 days    Minutes of Exercise per Session: 0 min  Stress: No Stress Concern Present (02/03/2022)   [employer] of Occupational Health - Occupational Stress Questionnaire    Feeling of Stress : Not at all  Social Connections: Unknown (08/27/2022)   Received [facility], [facility]   Social Network    Social Network: Not on file  Intimate Partner Violence: Unknown (08/27/2022)   Received from [facility], [facility]   HITS    Physically Hurt: Not on file    Insult or Talk Down To: Not on file    Threaten Physical Harm: Not on file    Scream or Curse: Not on file      Family History  Problem Relation Age of Onset   Cancer - Other Mother    Heart attack Father    Uterine cancer Sister    Colon cancer Neg Hx    Prostate cancer Neg Hx     Vitals:   08/13/23 1214  BP: 110/70  Pulse: 91  SpO2: 99%  Weight: 84.6 kg (186 lb 6.4 oz)   Wt Readings from Last 3 Encounters:  08/13/23  84.6 kg (186 lb 6.4 oz)  08/10/23 89.8 kg (198 lb)  07/31/23 86.3 kg (190 lb 4.8 oz)     PHYSICAL EXAM: General:  Sitting in chair  No resp difficulty HEENT: normal  + left nare packed Neck: supple. JVP to jaw  Carotids 2+ bilat; no bruits. No lymphadenopathy or thryomegaly appreciated. Cor: PMI nondisplaced. irregular rate & rhythm. No rubs, gallops or murmurs. Lungs: clear Abdomen: soft, nontender, nondistended. No hepatosplenomegaly. No bruits or masses. Good bowel sounds. Extremities: no cyanosis, clubbing, rash, 2-3+ edema Neuro: alert & orientedx3, cranial nerves grossly intact. moves all 4 extremities w/o difficulty. Affect pleasant   ECG: AF 91 Personally reviewed   ASSESSMENT & PLAN:   1. 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 RV low normal Mild MR - PYP 10/13/21  markedly positive  - cMRI 10/20/21: EF 54% RV 51% Diffuse LGE with ECV 50% - Remains on tafamadis (started 2023) - Echo today 08/13/23 EF 40-454% severe LV thickening. RV moderate  HK severe RAE. Personally reviewed - NYHA I-II with mostly R-sided symptoms and marked LE edema  - remains fluid overloaded. Will use Furoscix  x 2 days then switch to torsemide  80 daily - long discussion about ability to go to [place]. Given predominantly R-sided symptoms, particularly if he can get some more fluid off this week - Check labs - F/u next week with NP/PA - echo today reveals progressive biventricular dysfunction/thickening despite tafamadis use. He is also having neuropathic symptoms in hands and fett. Will add Amvuttrra  2. Chronic AF -  in setting of cardiac amyloid suspect AF is contributing to HF but I went back through his ECGs and has been in AF since at least 1/24 so would be difficult candidate for rhythm control. Will follow. If DC-CV contemplated  would need amio - on Pradaxa . With recent Epistaxis discussed potential switch to Eliquis  - not candidate for Watchman due to amyloid  3. CKD IV - SCr 2.5-3.0 in past - Most recent Scr 2.04 on 07/31/23 - recheck labs today  I spent a total of 48 minutes today: 1) reviewing the patient's medical records including previous charts, labs and recent notes from other providers; 2) examining the patient and counseling them on their medical issues/explaining the plan of care; 3) adjusting meds as needed and 4) ordering lab work or other needed tests.    Jules Oar, MD  12:22 PM

## 2023-08-13 NOTE — Progress Notes (Signed)
 Medication Samples have been provided to the patient.  Drug name: Furoscix        Strength: 80mg  /2ml        Qty: 2  LOT: [id number]  Exp.Date: 08/14/24  Dosing instructions: Take only as directed by the heart failure clinic  The patient has been instructed regarding the correct time, dose, and frequency of taking this medication, including desired effects and most common side effects.   Amelita Risinger Melven Stable Yama Nielson 12:47 PM 08/13/2023

## 2023-08-13 NOTE — Transitions of Care (Post Inpatient/ED Visit) (Signed)
 08/13/2023  Name: Gregory Guerrero MRN: [id number] DOB: [birth year]  Today's TOC FU Call Status: Today's TOC FU Call Status:: Successful TOC FU Call Completed TOC FU Call Complete Date: 08/13/23 Patient's Name and Date of Birth confirmed.  Transition Care Management Follow-up Telephone Call Date of Discharge: 08/12/23 Discharge Facility: Other [facility]) Name of Other (Non-[facility]) Discharge Facility: [facility] Type of Discharge: Emergency Department Reason for ED Visit: Other: (Epistaxis) How have you been since you were released from the hospital?: Better Any questions or concerns?: No  Items Reviewed: Did you receive and understand the discharge instructions provided?: Yes Medications obtained,verified, and reconciled?: Yes (Medications Reviewed) Any new allergies since your discharge?: No Dietary orders reviewed?: NA Do you have support at home?: Yes People in Home [RPT]: spouse  Medications Reviewed Today: Medications Reviewed Today     Reviewed by Cathye Coca, LPN (Licensed Practical Nurse) on 08/13/23 at 1555  Med List Status: <None>   Medication Order Taking? Sig Documenting Provider Last Dose Status Informant  acetaminophen  (TYLENOL ) 650 MG CR tablet [id number] Yes Take 650 mg by mouth every 8 (eight) hours as needed for pain. [provider] Taking Active Spouse/Significant Other  Ascorbic Acid (VITAMIN C) 1000 MG tablet [id number] Yes Take 1,000 mg by mouth daily. [provider] Taking Active Spouse/Significant Other  aspirin  EC 81 MG tablet [id number] Yes Take 81 mg by mouth at bedtime. [provider] Taking Active Spouse/Significant Other  atorvastatin  (LIPITOR) 80 MG tablet [id number] Yes TAKE 1 TABLET(80MG ) BY MOUTH EVERY OTHER DAY AND TAKE 1/2 TABLET BY MOUTH ON ALTERNATE DAYS Nishan, Peter C, MD Taking Active Spouse/Significant Other  FIBER PO [id number] Yes Take 1 Dose by mouth daily. [provider]  Taking Active Spouse/Significant Other  Furosemide  (FUROSCIX ) 80 MG/10ML CTKT [id number] Yes Inject 80 mg into the skin as directed. Bensimhon, Rheta Celestine, MD Taking Active   Multiple Vitamin (MULTIVITAMIN WITH MINERALS) TABS tablet [id number] Yes Take 1 tablet by mouth in the morning. [provider] Taking Active Spouse/Significant Other  potassium chloride  SA (KLOR-CON  M) 20 MEQ tablet [id number] Yes Take 2 tablets (40 mEq total) by mouth as directed. When you take Metolazone Bensimhon, Rheta Celestine, MD Taking Active   PRADAXA  150 MG CAPS capsule [id number] Yes Take 1 capsule (150 mg total) by mouth 2 (two) times daily. NEEDS FOLLOW UP APPOINTMENT FOR MORE REFILLS Darlis Eisenmenger, MD Taking Active Spouse/Significant Other  Tafamidis  (VYNDAMAX ) 61 MG CAPS [id number] Yes Take 1 capsule by mouth daily. Bensimhon, Rheta Celestine, MD Taking Active   torsemide  (DEMADEX ) 20 MG tablet [id number] Yes Take 4 tablets (80 mg total) by mouth daily. Bensimhon, Rheta Celestine, MD Taking Active             Home Care and Equipment/Supplies: Were Home Health Services Ordered?: NA Any new equipment or medical supplies ordered?: NA  Functional Questionnaire: Do you need assistance with bathing/showering or dressing?: No Do you need assistance with meal preparation?: No Do you need assistance with eating?: No Do you have difficulty maintaining continence: No Do you need assistance with getting out of bed/getting out of a chair/moving?: No Do you have difficulty managing or taking your medications?: No  Follow up appointments reviewed: PCP Follow-up appointment confirmed?: NA Specialist Hospital Follow-up appointment confirmed?: NA Do you need transportation to your follow-up appointment?: No Do you understand care options if your condition(s) worsen?: Yes-patient verbalized understanding    SIGNATURE Seabron Cypress, LPN [facility] Health Advisor Stella l  [facility] You Are. We Are. One  [facility] Direct Dial [phone]

## 2023-08-13 NOTE — Telephone Encounter (Signed)
 Patient Advocate Encounter   Received notification from [facility] that prior authorization for Amvuttra is required.   PA submitted on CoverMyMeds Key [id number] Status is pending   Will continue to follow.  Luster Salters, PharmD, BCPS, BCCP, CPP Heart Failure Clinic Pharmacist [phone]

## 2023-08-13 NOTE — Progress Notes (Signed)
 Provided patient education on Furoscix using demo kits and Furoscix video, QR code provided on AVS for further viewing. Furoscix order submitted online, ov note and ins card uploaded to [employer].

## 2023-08-13 NOTE — Patient Instructions (Signed)
 Medication Changes:  Your provider has order Furoscix  for you. This is an on-body infuser that gives you a dose of Furosemide .   It will be shipped to your home   Furoscix  Direct will call you to discuss before shipping so, PLEASE answer unknown calls  For questions regarding the device call Furoscix  Direct at [phone]  Ensure you write down the time you start your infusion so that if there is a problem you will know how long the infusion lasted  Use Furoscix  only AS DIRECTED by our office  Dosing Directions:   Day 1= TODAY 4/28 use 1 Furoscix  Kit  Day 2= Tomorrow Tue 4/29, use 1 Furoscix  kit  Day 3= Wed 4/30 START Torsemide  80 mg (4 tabs) Daily   Lab Work:  Labs done today, your results will be available in MyChart, we will contact you for abnormal readings.  Special Instructions // Education:  Do the following things EVERYDAY: Weigh yourself in the morning before breakfast. Write it down and keep it in a log. Take your medicines as prescribed Eat low salt foods--Limit salt (sodium) to 2000 mg per day.  Stay as active as you can everyday Limit all fluids for the day to less than 2 liters   Follow-Up in: 1 week   At the [facility], you and your health needs are our priority. We have a designated team specialized in the treatment of Heart Failure. This Care Team includes your primary Heart Failure Specialized Cardiologist (physician), Advanced Practice Providers (APPs- Physician Assistants and Nurse Practitioners), and Pharmacist who all work together to provide you with the care you need, when you need it.   You may see any of the following providers on your designated Care Team at your next follow up:  Dr. Jules Oar Dr. Peder Bourdon Dr. Alwin Baars Dr. Judyth Nunnery Nieves Bars, NP Ruddy Corral, [place] [facility] [place], [place] Dennise Fitz, NP [place] Lee, NP Luster Salters, PharmD   Please be sure to bring in all your  medications bottles to every appointment.   Need to Contact Us :  If you have any questions or concerns before your next appointment please send us  a message through [place] or call our office at [phone].    TO LEAVE A MESSAGE FOR THE NURSE SELECT OPTION 2, PLEASE LEAVE A MESSAGE INCLUDING: YOUR NAME DATE OF BIRTH CALL BACK NUMBER REASON FOR CALL**this is important as we prioritize the call backs  YOU WILL RECEIVE A CALL BACK THE SAME DAY AS LONG AS YOU CALL BEFORE 4:00 PM

## 2023-08-14 ENCOUNTER — Encounter (HOSPITAL_COMMUNITY): Payer: Self-pay

## 2023-08-14 ENCOUNTER — Other Ambulatory Visit (HOSPITAL_COMMUNITY): Payer: Self-pay | Admitting: Pharmacy Technician

## 2023-08-14 ENCOUNTER — Other Ambulatory Visit: Payer: Self-pay

## 2023-08-14 ENCOUNTER — Other Ambulatory Visit (HOSPITAL_COMMUNITY): Payer: Self-pay

## 2023-08-14 ENCOUNTER — Telehealth (HOSPITAL_COMMUNITY): Payer: Self-pay | Admitting: *Deleted

## 2023-08-14 ENCOUNTER — Other Ambulatory Visit (HOSPITAL_COMMUNITY): Payer: Self-pay | Admitting: Pharmacist

## 2023-08-14 DIAGNOSIS — Z7901 Long term (current) use of anticoagulants: Secondary | ICD-10-CM | POA: Diagnosis not present

## 2023-08-14 DIAGNOSIS — R040 Epistaxis: Secondary | ICD-10-CM | POA: Diagnosis not present

## 2023-08-14 MED ORDER — AMVUTTRA 25 MG/0.5ML ~~LOC~~ SOSY
25.0000 mg | PREFILLED_SYRINGE | SUBCUTANEOUS | 3 refills | Status: AC
  Filled 2023-08-14 (×4): qty 0.5, 90d supply, fill #0
  Filled 2023-11-08 (×3): qty 0.5, 90d supply, fill #1
  Filled 2024-02-18: qty 0.5, 90d supply, fill #2
  Filled 2024-05-23: qty 0.5, 90d supply, fill #3

## 2023-08-14 NOTE — Telephone Encounter (Signed)
 Called and spoke w/pt, he states he used 1 furoscix  kit last night and spent the night in the restroom, he reports feeling better and losing a lot of fluid, he will use the 2nd kit today when he gets home today

## 2023-08-14 NOTE — Telephone Encounter (Signed)
 Furoscix  Direct-->Rx triaged to [facility] 08/14/2023 12:39 PM  Furoscix  Direct--> Spoke to the Pharmacy Dept @ [facility] (Ph: [phone]): Insurance received the Prior-authorization request for drug (Furoscix  80mg / 10ml) on 08/13/2023. It has been approved. It is effective from 08/13/2023 valid and billable till 04/16/2098. Approved Case ID: [id number]. Approval letter has already been faxed to the MD office. Rep: Criselda Dolly 08/14/2023 12:09 PM

## 2023-08-14 NOTE — Progress Notes (Signed)
 Specialty Pharmacy Initial Fill Coordination Note  Gregory Guerrero is a 78 y.o. male contacted today regarding initial fill of specialty medication(s) [name], [name], [name], [address])   Delivery date: 08/20/23   Verified address: [facility] - [address] [place], [name], [name], [address]   Medication will be filled on Friday 05/02.   Patient is aware of $0 copayment.

## 2023-08-14 NOTE — Progress Notes (Signed)
 Specialty Pharmacy Initiation Note   Gregory Guerrero is a 78 y.o. male who will be followed by the specialty pharmacy service for RxSp Cardiology    Review of administration, indication, effectiveness, safety, potential side effects, storage/disposable, and missed dose instructions occurred today for patient's specialty medication(s) Vutrisiran Sodium (Amvuttra)     Patient/Caregiver did not have any additional questions or concerns.   Patient's therapy is appropriate to: Initiate    Goals Addressed             This Visit's Progress    Slow Disease Progression       Patient is initiating therapy. Patient will maintain adherence         [name] [name] Specialty Pharmacist

## 2023-08-14 NOTE — Telephone Encounter (Signed)
 Advanced [facility] Failure Patient Advocate Encounter  Prior Authorization for Amvuttra has been approved.    PA# [id number] Effective dates: 08/13/23 through "until further notice"  Patients co-pay is $0.00   Luster Salters, PharmD, BCPS, BCCP, CPP Heart Failure Clinic Pharmacist [phone]

## 2023-08-15 ENCOUNTER — Other Ambulatory Visit: Payer: Self-pay

## 2023-08-15 ENCOUNTER — Telehealth (HOSPITAL_COMMUNITY): Payer: Self-pay | Admitting: Cardiology

## 2023-08-15 NOTE — Telephone Encounter (Signed)
 Patients wife called to report pt seen in ER for nose bleed 4/27, was advised to hold pradaxa  and return to have rhino rocket removed 4/29    At 4/29 ER visit was told bleeding still active unable to remove, will need to keep rhino rocket in place until 5/2  Pt has plans to travel to [place] on 5/1 for granddaughters wedding and afraid to travel with rhino rocket. Would like to confirm if airplane travel is ok.  Pt did not arrange ENT follow up as instructed by ER

## 2023-08-15 NOTE — Telephone Encounter (Signed)
 Nosebleeds.  Wife/patient aware all things have been done from HF standpoint to prepare of upcoming travel however with pradaxa  potentially causing nosebleeds would appreciate input

## 2023-08-16 ENCOUNTER — Telehealth: Payer: Self-pay | Admitting: Pharmacy Technician

## 2023-08-16 ENCOUNTER — Other Ambulatory Visit (HOSPITAL_COMMUNITY): Payer: Self-pay

## 2023-08-16 ENCOUNTER — Encounter: Payer: Self-pay | Admitting: Cardiovascular Disease

## 2023-08-16 DIAGNOSIS — R040 Epistaxis: Secondary | ICD-10-CM | POA: Diagnosis not present

## 2023-08-16 DIAGNOSIS — T45515A Adverse effect of anticoagulants, initial encounter: Secondary | ICD-10-CM | POA: Diagnosis not present

## 2023-08-16 NOTE — Telephone Encounter (Signed)
 LMOM

## 2023-08-17 ENCOUNTER — Other Ambulatory Visit: Payer: Self-pay

## 2023-08-17 MED ORDER — RIVAROXABAN 15 MG PO TABS: 15.0000 mg | ORAL_TABLET | Freq: Every day | ORAL | 11 refills | Status: DC

## 2023-08-17 NOTE — Progress Notes (Signed)
 ADVANCED [facility] CLINIC NOTE  Referring Physician: Dr. Stann Earnest  Primary Care: Ezell Hollow, MD Primary Cardiologist: Janelle Mediate, MD   Chief complaint: Heart failure  HPI:  78 yo male with CAD s/p remote stenting of LAD in [place], previous DVT, HTN, CKD IV, PAF and diastolic HF. REferred by Dr. 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   Has been followed by Dr. Stann Earnest. Brief cardiac history includes:   Echo 02/01/17 EF 50% grade 2 diastolic mild MR  ETT 02/23/17 normal    Noted new onset afib 07/27/20/ Started on renal dose xarelto  15 mg daily. -> DCCv 07/27/20 with conversion to NSR    In 07/22/21 recurrent AF/AFL  -> Had successful repeat [facility] on 08/05/21 Done on amiodarone  200 mg daily and pradaxa  150 mg bi.  Following with Dr. Marven Slimmer for possible Watchman  Referred to me for RHC in 6/23 due to LE edema. RHC with prominent v-waves in PCWP tracing suspicious for MR vs severe diastolic dysfunction. TTE with mild MR. 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   RHC 09/28/21   RA = 7 RV = 48/8 PA = 52/16 (34) PCW = 22 (v = 40) Fick cardiac output/index = 4.1/2.0 PVR = 3.2 FA sat = 97% PA sat = 61%, 58%  Echo 6/23 EF 60% strain with cherry on top. RV low normal Mild MR  PYP 10/13/21  markedly positive    cMRI 10/20/21: EF 54% RV 51% Diffuse LGE with ECV 50%  Saw Dr. Maria Shiner Serum light chains mildly elevated. Urine IFE normal . Felt not to have light chain amyloidosis. BMBx negatvie  I saw him last week for an acute visit due to volume overload. I hd not seen him since 2023. He was markedly volume overloaded. We increased torsemide  to 60 daily and added metolazone  for 3 days. Says urine output picked up but not impressively  Weight down 12 pounds. Over the weekend had epistaxis and went to ED. Nose packed. Having tingling in hands/feet.  Echo today EF 40-45% severe LV thickening. RV moderate HK severe RAE. Personally reviewed    Past Medical History:  Diagnosis Date    Arthritis    CAD (coronary artery disease)    Chronic kidney disease    DVT (deep venous thrombosis) (HCC)    following spinal surgery Nov 2017   HTN (hypertension)    MI (myocardial infarction) (HCC)    1999 ; stents placed    Numbness and tingling in both hands    Spondylosis of cervical spine     Current Outpatient Medications  Medication Sig Dispense Refill   acetaminophen  (TYLENOL ) 650 MG CR tablet Take 650 mg by mouth every 8 (eight) hours as needed for pain.     Ascorbic Acid (VITAMIN C) 1000 MG tablet Take 1,000 mg by mouth daily.     aspirin  EC 81 MG tablet Take 81 mg by mouth at bedtime.     atorvastatin  (LIPITOR) 80 MG tablet TAKE 1 TABLET(80MG ) BY MOUTH EVERY OTHER DAY AND TAKE 1/2 TABLET BY MOUTH ON ALTERNATE DAYS 90 tablet 3   FIBER PO Take 1 Dose by mouth daily.     Furosemide  (FUROSCIX ) 80 MG/10ML CTKT Inject 80 mg into the skin as directed.     Multiple Vitamin (MULTIVITAMIN WITH MINERALS) TABS tablet Take 1 tablet by mouth in the morning.     potassium chloride  SA (KLOR-CON  M) 20 MEQ tablet Take 2 tablets (40 mEq total) by mouth as directed. When you  take Metolazone  10 tablet 1   Rivaroxaban  (XARELTO ) 15 MG TABS tablet Take 1 tablet (15 mg total) by mouth daily with supper. 30 tablet 11   Tafamidis  (VYNDAMAX ) 61 MG CAPS Take 1 capsule by mouth daily. 90 capsule 3   torsemide  (DEMADEX ) 20 MG tablet Take 4 tablets (80 mg total) by mouth daily.     vutrisiran  sodium (AMVUTTRA ) 25 MG/0.5ML syringe Inject 0.5 mLs (25 mg total) into the skin every 3 (three) months. 0.5 mL 3   No current facility-administered medications for this visit.    No Known Allergies    Social History   Socioeconomic History   Marital status: Married    Spouse name: Not on file   Number of children: 2   Years of education: Not on file   Highest education level: Not on file  Occupational History   Occupation: retired , Nature conservation officer busines   Tobacco Use   Smoking status: Former    Current  packs/day: 0.00    Types: Cigarettes    Quit date: 04/26/1983    Years since quitting: 40.3   Smokeless tobacco: Never   Tobacco comments:    quit over 40 years ago   Vaping Use   Vaping status: Never Used  Substance and Sexual Activity   Alcohol use: No   Drug use: No   Sexual activity: Never  Other Topics Concern   Not on file  Social History Narrative   Household pt, wife    [place] from [place] 2011    Social Drivers of Health   Financial Resource Strain: Low Risk  (02/03/2022)   Overall Financial Resource Strain (CARDIA)    Difficulty of Paying Living Expenses: Not hard at all  Food Insecurity: No Food Insecurity (02/03/2022)   Hunger Vital Sign    Worried About Running Out of Food in the Last Year: Never true    Ran Out of Food in the Last Year: Never true  Transportation Needs: No Transportation Needs (02/03/2022)   PRAPARE - Administrator, Civil Service (Medical): No    Lack of Transportation (Non-Medical): No  Physical Activity: Inactive (02/03/2022)   Exercise Vital Sign    Days of Exercise per Week: 0 days    Minutes of Exercise per Session: 0 min  Stress: No Stress Concern Present (02/03/2022)   [employer] of Occupational Health - Occupational Stress Questionnaire    Feeling of Stress : Not at all  Social Connections: Unknown (08/27/2022)   Received from [facility], [facility]   Social Network    Social Network: Not on file  Intimate Partner Violence: Unknown (08/27/2022)   Received from [facility], [facility]   HITS    Physically Hurt: Not on file    Insult or Talk Down To: Not on file    Threaten Physical Harm: Not on file    Scream or Curse: Not on file      Family History  Problem Relation Age of Onset   Cancer - Other Mother    Heart attack Father    Uterine cancer Sister    Colon cancer Neg Hx    Prostate cancer Neg Hx     There were no vitals filed for this visit.  Wt Readings from Last 3 Encounters:  08/13/23  84.6 kg (186 lb 6.4 oz)  08/10/23 89.8 kg (198 lb)  07/31/23 86.3 kg (190 lb 4.8 oz)     PHYSICAL EXAM: General:  Sitting in chair No resp  difficulty HEENT: normal  + left nare packed Neck: supple. JVP to jaw  Carotids 2+ bilat; no bruits. No lymphadenopathy or thryomegaly appreciated. Cor: PMI nondisplaced. irregular rate & rhythm. No rubs, gallops or murmurs. Lungs: clear Abdomen: soft, nontender, nondistended. No hepatosplenomegaly. No bruits or masses. Good bowel sounds. Extremities: no cyanosis, clubbing, rash, 2-3+ edema Neuro: alert & orientedx3, cranial nerves grossly intact. moves all 4 extremities w/o difficulty. Affect pleasant   ECG: AF 91 Personally reviewed   ASSESSMENT & PLAN:   1. 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 RV low normal Mild MR - PYP 10/13/21  markedly positive  - cMRI 10/20/21: EF 54% RV 51% Diffuse LGE with ECV 50% - Remains on tafamadis (started 2023) - Echo today 08/13/23 EF 40-454% severe LV thickening. RV moderate  HK severe RAE. Personally reviewed - NYHA I-II with mostly R-sided symptoms and marked LE edema  - remains fluid overloaded. Will use Furoscix  x 2 days then switch to torsemide  80 daily - long discussion about ability to go to [place]. Given predominantly R-sided symptoms, particularly if he can get some more fluid off this week - Check labs - F/u next week with NP/PA - echo today reveals progressive biventricular dysfunction/thickening despite tafamadis use. He is also having neuropathic symptoms in hands and fett. Will add Amvuttrra  2. Chronic AF -  in setting of cardiac amyloid suspect AF is contributing to HF but I went back through his ECGs and has been in AF since at least 1/24 so would be difficult candidate for rhythm control. Will follow. If DC-CV contemplated  would need amio - on Pradaxa . With recent Epistaxis discussed potential switch to Eliquis  - not candidate for Watchman due to amyloid  3. CKD IV - SCr 2.5-3.0 in past - Most recent Scr 2.04 on 07/31/23 - recheck labs today  I spent a total of 48 minutes today: 1) reviewing the patient's medical records including previous charts, labs and recent notes from other providers; 2) examining the patient and counseling them on their medical issues/explaining the plan of care; 3) adjusting meds as needed and 4) ordering lab work or other needed tests.    Elmarie Hacking, FNP  1:09 PM

## 2023-08-17 NOTE — Telephone Encounter (Signed)
 Called patient's wife with Dr. Francie Irani advisement. Patient is still having bleeding. Encouraged patient's wife to hold xarelto  for now, until he is no longer bleeding. Patient and his wife are sad about missing their granddaughter's wedding this weekend. Will let Dr. Stann Earnest know that patient continues to have bleeding.

## 2023-08-20 ENCOUNTER — Telehealth (HOSPITAL_COMMUNITY): Payer: Self-pay

## 2023-08-20 DIAGNOSIS — T45515A Adverse effect of anticoagulants, initial encounter: Secondary | ICD-10-CM | POA: Diagnosis not present

## 2023-08-20 DIAGNOSIS — Z1330 Encounter for screening examination for mental health and behavioral disorders, unspecified: Secondary | ICD-10-CM | POA: Diagnosis not present

## 2023-08-20 DIAGNOSIS — R040 Epistaxis: Secondary | ICD-10-CM | POA: Diagnosis not present

## 2023-08-20 NOTE — Telephone Encounter (Signed)
 Called to confirm/remind patient of their appointment at the [facility] on 12:00.   Appointment:   [x] Confirmed  [] Left mess   [] No answer/No voice mail  [] VM Full/unable to leave message  [] Phone not in service  Patient reminded to bring all medications and/or complete list.  Confirmed patient has transportation. Gave directions, instructed to utilize valet parking.

## 2023-08-21 ENCOUNTER — Encounter (HOSPITAL_COMMUNITY): Payer: Self-pay

## 2023-08-21 ENCOUNTER — Ambulatory Visit (HOSPITAL_COMMUNITY)
Admission: RE | Admit: 2023-08-21 | Discharge: 2023-08-21 | Disposition: A | Discharge status: Home / Self Care | Source: Ambulatory Visit | Attending: Family Medicine | Admitting: Family Medicine

## 2023-08-21 ENCOUNTER — Telehealth (HOSPITAL_COMMUNITY): Payer: Self-pay | Admitting: Pharmacist

## 2023-08-21 ENCOUNTER — Encounter (HOSPITAL_COMMUNITY): Payer: Self-pay | Admitting: Internal Medicine

## 2023-08-21 VITALS — BP 110/70 | HR 98 | Wt 180.0 lb

## 2023-08-21 DIAGNOSIS — Z7901 Long term (current) use of anticoagulants: Secondary | ICD-10-CM | POA: Insufficient documentation

## 2023-08-21 DIAGNOSIS — E854 Organ-limited amyloidosis: Secondary | ICD-10-CM | POA: Diagnosis not present

## 2023-08-21 DIAGNOSIS — I43 Cardiomyopathy in diseases classified elsewhere: Secondary | ICD-10-CM | POA: Insufficient documentation

## 2023-08-21 DIAGNOSIS — Z955 Presence of coronary angioplasty implant and graft: Secondary | ICD-10-CM | POA: Diagnosis not present

## 2023-08-21 DIAGNOSIS — I2510 Atherosclerotic heart disease of native coronary artery without angina pectoris: Secondary | ICD-10-CM | POA: Diagnosis not present

## 2023-08-21 DIAGNOSIS — I5022 Chronic systolic (congestive) heart failure: Secondary | ICD-10-CM | POA: Diagnosis not present

## 2023-08-21 DIAGNOSIS — Z79899 Other long term (current) drug therapy: Secondary | ICD-10-CM | POA: Diagnosis not present

## 2023-08-21 DIAGNOSIS — Z86718 Personal history of other venous thrombosis and embolism: Secondary | ICD-10-CM | POA: Diagnosis not present

## 2023-08-21 DIAGNOSIS — N184 Chronic kidney disease, stage 4 (severe): Secondary | ICD-10-CM | POA: Diagnosis not present

## 2023-08-21 DIAGNOSIS — I4821 Permanent atrial fibrillation: Secondary | ICD-10-CM | POA: Diagnosis not present

## 2023-08-21 DIAGNOSIS — I130 Hypertensive heart and chronic kidney disease with heart failure and stage 1 through stage 4 chronic kidney disease, or unspecified chronic kidney disease: Secondary | ICD-10-CM | POA: Insufficient documentation

## 2023-08-21 DIAGNOSIS — Z87891 Personal history of nicotine dependence: Secondary | ICD-10-CM | POA: Insufficient documentation

## 2023-08-21 DIAGNOSIS — E8582 Wild-type transthyretin-related (ATTR) amyloidosis: Secondary | ICD-10-CM | POA: Diagnosis not present

## 2023-08-21 LAB — BASIC METABOLIC PANEL WITH GFR
Anion gap: 12 (ref 5–15)
BUN: 65 mg/dL — ABNORMAL HIGH (ref 8–23)
CO2: 29 mmol/L (ref 22–32)
Calcium: 9.6 mg/dL (ref 8.9–10.3)
Chloride: 99 mmol/L (ref 98–111)
Creatinine, Ser: 2.29 mg/dL — ABNORMAL HIGH (ref 0.61–1.24)
GFR, Estimated: 28 mL/min — ABNORMAL LOW (ref >60)
Glucose, Bld: 108 mg/dL — ABNORMAL HIGH (ref 70–99)
Potassium: 3.8 mmol/L (ref 3.5–5.1)
Sodium: 140 mmol/L (ref 135–145)

## 2023-08-21 LAB — BRAIN NATRIURETIC PEPTIDE: B Natriuretic Peptide: 377.8 pg/mL — ABNORMAL HIGH (ref 0.0–100.0)

## 2023-08-21 MED ORDER — TORSEMIDE 20 MG PO TABS: ORAL_TABLET | ORAL | Status: DC

## 2023-08-21 MED ORDER — VUTRISIRAN SODIUM 25 MG/0.5ML ~~LOC~~ SOSY
25.0000 mg | PREFILLED_SYRINGE | Freq: Once | SUBCUTANEOUS | Status: AC
Start: 2023-08-21 — End: 2023-08-21
  Administered 2023-08-21: 25 mg via SUBCUTANEOUS

## 2023-08-21 NOTE — Patient Instructions (Signed)
 Medication Changes:  INCREASE TORSEMIDE  TO 80MG  IN THE MORNING AND 40MG  IN THE EVENING   Lab Work:  Labs done today, your results will be available in MyChart, we will contact you for abnormal readings.  THEN RETURN FOR LABS AGAIN IN 1 WEEK   Special Instructions // Education:  PRESCRIPTION GIVEN FOR COMPRESSION STOCKINGS PUT THEM ON IN THE MORNING AND REMOVE IN THE EVENING   Follow-Up in: 3 MONTHS PLEASE CALL OUR OFFICE AROUND JUNE TO GET SCHEDULED FOR YOUR APPOINTMENT. PHONE NUMBER IS [phone] OPTION 2   At the [facility], you and your health needs are our priority. We have a designated team specialized in the treatment of Heart Failure. This Care Team includes your primary Heart Failure Specialized Cardiologist (physician), Advanced Practice Providers (APPs- Physician Assistants and Nurse Practitioners), and Pharmacist who all work together to provide you with the care you need, when you need it.   You may see any of the following providers on your designated Care Team at your next follow up:  Dr. Jules Oar Dr. Peder Bourdon Dr. Alwin Baars Dr. Judyth Nunnery Nieves Bars, NP Ruddy Corral, [place] [facility] [place], [place] Dennise Fitz, NP [place] Lee, NP Luster Salters, PharmD   Please be sure to bring in all your medications bottles to every appointment.   Need to Contact Us :  If you have any questions or concerns before your next appointment please send us  a message through [place] or call our office at [phone].    TO LEAVE A MESSAGE FOR THE NURSE SELECT OPTION 2, PLEASE LEAVE A MESSAGE INCLUDING: YOUR NAME DATE OF BIRTH CALL BACK NUMBER REASON FOR CALL**this is important as we prioritize the call backs  YOU WILL RECEIVE A CALL BACK THE SAME DAY AS LONG AS YOU CALL BEFORE 4:00 PM '

## 2023-08-21 NOTE — Telephone Encounter (Signed)
 Amvuttra  (vutrisiran ) injection administered in clinic visit today with Vernia Good, FNP. Amvuttra  given in left arm. NDC [address]-8; Lot [id number]; exp date 03/2025.    Patient tolerated injection well. Repeat injection scheduled for 11/21/23. Patient was counseled on adverse effects of Amvuttra , including vitamin A deficiency and injection site reactions. He was instructed to start vitamin A, at least 900 mcg (3000 international units daily) as Amvuttra  can decrease vitamin A levels. He will check his multivitamin to see if it includes the required amount.     Luster Salters, PharmD, BCPS, BCCP, CPP Heart Failure Clinic Pharmacist [phone]

## 2023-08-23 ENCOUNTER — Other Ambulatory Visit: Payer: Self-pay

## 2023-08-27 DIAGNOSIS — Z79899 Other long term (current) drug therapy: Secondary | ICD-10-CM | POA: Diagnosis not present

## 2023-08-27 DIAGNOSIS — I890 Lymphedema, not elsewhere classified: Secondary | ICD-10-CM | POA: Diagnosis not present

## 2023-08-27 DIAGNOSIS — I83019 Varicose veins of right lower extremity with ulcer of unspecified site: Secondary | ICD-10-CM | POA: Diagnosis not present

## 2023-08-27 DIAGNOSIS — L97929 Non-pressure chronic ulcer of unspecified part of left lower leg with unspecified severity: Secondary | ICD-10-CM | POA: Diagnosis not present

## 2023-08-27 DIAGNOSIS — I83029 Varicose veins of left lower extremity with ulcer of unspecified site: Secondary | ICD-10-CM | POA: Diagnosis not present

## 2023-08-27 DIAGNOSIS — I509 Heart failure, unspecified: Secondary | ICD-10-CM | POA: Diagnosis not present

## 2023-08-27 DIAGNOSIS — E859 Amyloidosis, unspecified: Secondary | ICD-10-CM | POA: Diagnosis not present

## 2023-08-27 DIAGNOSIS — Z87891 Personal history of nicotine dependence: Secondary | ICD-10-CM | POA: Diagnosis not present

## 2023-08-27 DIAGNOSIS — Z794 Long term (current) use of insulin: Secondary | ICD-10-CM | POA: Diagnosis not present

## 2023-08-27 DIAGNOSIS — L97919 Non-pressure chronic ulcer of unspecified part of right lower leg with unspecified severity: Secondary | ICD-10-CM | POA: Diagnosis not present

## 2023-08-28 ENCOUNTER — Other Ambulatory Visit (HOSPITAL_COMMUNITY)

## 2023-08-29 ENCOUNTER — Other Ambulatory Visit (HOSPITAL_COMMUNITY): Payer: Self-pay | Admitting: Family Medicine

## 2023-08-29 ENCOUNTER — Other Ambulatory Visit: Payer: Self-pay

## 2023-08-29 DIAGNOSIS — Z4789 Encounter for other orthopedic aftercare: Secondary | ICD-10-CM | POA: Diagnosis not present

## 2023-08-29 DIAGNOSIS — I5022 Chronic systolic (congestive) heart failure: Secondary | ICD-10-CM | POA: Diagnosis not present

## 2023-08-29 DIAGNOSIS — Z789 Other specified health status: Secondary | ICD-10-CM | POA: Diagnosis not present

## 2023-08-29 DIAGNOSIS — I890 Lymphedema, not elsewhere classified: Secondary | ICD-10-CM | POA: Diagnosis not present

## 2023-08-29 DIAGNOSIS — I83019 Varicose veins of right lower extremity with ulcer of unspecified site: Secondary | ICD-10-CM | POA: Diagnosis not present

## 2023-08-29 DIAGNOSIS — Z96611 Presence of right artificial shoulder joint: Secondary | ICD-10-CM | POA: Diagnosis not present

## 2023-08-29 DIAGNOSIS — L97929 Non-pressure chronic ulcer of unspecified part of left lower leg with unspecified severity: Secondary | ICD-10-CM | POA: Diagnosis not present

## 2023-08-29 DIAGNOSIS — L97919 Non-pressure chronic ulcer of unspecified part of right lower leg with unspecified severity: Secondary | ICD-10-CM | POA: Diagnosis not present

## 2023-08-29 DIAGNOSIS — M25611 Stiffness of right shoulder, not elsewhere classified: Secondary | ICD-10-CM | POA: Diagnosis not present

## 2023-08-29 DIAGNOSIS — I509 Heart failure, unspecified: Secondary | ICD-10-CM | POA: Diagnosis not present

## 2023-08-29 DIAGNOSIS — R29898 Other symptoms and signs involving the musculoskeletal system: Secondary | ICD-10-CM | POA: Diagnosis not present

## 2023-08-29 DIAGNOSIS — I83029 Varicose veins of left lower extremity with ulcer of unspecified site: Secondary | ICD-10-CM | POA: Diagnosis not present

## 2023-08-29 NOTE — Progress Notes (Signed)
 Specialty Pharmacy Refill Coordination Note  Gregory Guerrero is a 78 y.o. male contacted today regarding refills of specialty medication(s) Tafamidis  (Vyndamax )   Patient requested Delivery   Delivery date: 09/03/23   Verified address: [address] [name] [address]   Medication will be filled on 05.16.25.

## 2023-08-30 ENCOUNTER — Encounter (HOSPITAL_COMMUNITY): Payer: Self-pay | Admitting: *Deleted

## 2023-08-30 ENCOUNTER — Telehealth (HOSPITAL_COMMUNITY): Payer: Self-pay | Admitting: Cardiology

## 2023-08-30 LAB — BASIC METABOLIC PANEL WITH GFR
BUN/Creatinine Ratio: 32 — ABNORMAL HIGH (ref 10–24)
BUN: 67 mg/dL — ABNORMAL HIGH (ref 8–27)
CO2: 25 mmol/L (ref 20–29)
Calcium: 9.3 mg/dL (ref 8.6–10.2)
Chloride: 99 mmol/L (ref 96–106)
Creatinine, Ser: 2.08 mg/dL — ABNORMAL HIGH (ref 0.76–1.27)
Glucose: 86 mg/dL (ref 70–99)
Potassium: 4.1 mmol/L (ref 3.5–5.2)
Sodium: 140 mmol/L (ref 134–144)
eGFR: 32 mL/min/{1.73_m2} — ABNORMAL LOW (ref >59)

## 2023-08-30 NOTE — Telephone Encounter (Signed)
 Letter updated and sent via mychart to pt

## 2023-08-30 NOTE — Telephone Encounter (Signed)
 Pts wife left VM on triage line with request to back date travel letter to accommodate travel dates. Current letter dated 5/6, letter should be written to include travel date 4/29  Will discuss with provider

## 2023-08-31 ENCOUNTER — Ambulatory Visit (HOSPITAL_COMMUNITY): Payer: Self-pay | Admitting: Family Medicine

## 2023-08-31 ENCOUNTER — Other Ambulatory Visit (HOSPITAL_COMMUNITY): Payer: Self-pay | Admitting: Family Medicine

## 2023-09-04 DIAGNOSIS — Z789 Other specified health status: Secondary | ICD-10-CM | POA: Diagnosis not present

## 2023-09-04 DIAGNOSIS — I83019 Varicose veins of right lower extremity with ulcer of unspecified site: Secondary | ICD-10-CM | POA: Diagnosis not present

## 2023-09-04 DIAGNOSIS — Z4789 Encounter for other orthopedic aftercare: Secondary | ICD-10-CM | POA: Diagnosis not present

## 2023-09-04 DIAGNOSIS — L97919 Non-pressure chronic ulcer of unspecified part of right lower leg with unspecified severity: Secondary | ICD-10-CM | POA: Diagnosis not present

## 2023-09-04 DIAGNOSIS — L97929 Non-pressure chronic ulcer of unspecified part of left lower leg with unspecified severity: Secondary | ICD-10-CM | POA: Diagnosis not present

## 2023-09-04 DIAGNOSIS — M25611 Stiffness of right shoulder, not elsewhere classified: Secondary | ICD-10-CM | POA: Diagnosis not present

## 2023-09-04 DIAGNOSIS — I83028 Varicose veins of left lower extremity with ulcer other part of lower leg: Secondary | ICD-10-CM | POA: Diagnosis not present

## 2023-09-04 DIAGNOSIS — Z96611 Presence of right artificial shoulder joint: Secondary | ICD-10-CM | POA: Diagnosis not present

## 2023-09-04 DIAGNOSIS — I83029 Varicose veins of left lower extremity with ulcer of unspecified site: Secondary | ICD-10-CM | POA: Diagnosis not present

## 2023-09-04 DIAGNOSIS — I83893 Varicose veins of bilateral lower extremities with other complications: Secondary | ICD-10-CM | POA: Diagnosis not present

## 2023-09-04 DIAGNOSIS — R29898 Other symptoms and signs involving the musculoskeletal system: Secondary | ICD-10-CM | POA: Diagnosis not present

## 2023-09-04 DIAGNOSIS — I83018 Varicose veins of right lower extremity with ulcer other part of lower leg: Secondary | ICD-10-CM | POA: Diagnosis not present

## 2023-09-04 DIAGNOSIS — L97828 Non-pressure chronic ulcer of other part of left lower leg with other specified severity: Secondary | ICD-10-CM | POA: Diagnosis not present

## 2023-09-04 DIAGNOSIS — I509 Heart failure, unspecified: Secondary | ICD-10-CM | POA: Diagnosis not present

## 2023-09-04 DIAGNOSIS — I890 Lymphedema, not elsewhere classified: Secondary | ICD-10-CM | POA: Diagnosis not present

## 2023-09-11 DIAGNOSIS — I83019 Varicose veins of right lower extremity with ulcer of unspecified site: Secondary | ICD-10-CM | POA: Diagnosis not present

## 2023-09-11 DIAGNOSIS — Z96611 Presence of right artificial shoulder joint: Secondary | ICD-10-CM | POA: Diagnosis not present

## 2023-09-11 DIAGNOSIS — L97929 Non-pressure chronic ulcer of unspecified part of left lower leg with unspecified severity: Secondary | ICD-10-CM | POA: Diagnosis not present

## 2023-09-11 DIAGNOSIS — R29898 Other symptoms and signs involving the musculoskeletal system: Secondary | ICD-10-CM | POA: Diagnosis not present

## 2023-09-11 DIAGNOSIS — M25611 Stiffness of right shoulder, not elsewhere classified: Secondary | ICD-10-CM | POA: Diagnosis not present

## 2023-09-11 DIAGNOSIS — I890 Lymphedema, not elsewhere classified: Secondary | ICD-10-CM | POA: Diagnosis not present

## 2023-09-11 DIAGNOSIS — Z4789 Encounter for other orthopedic aftercare: Secondary | ICD-10-CM | POA: Diagnosis not present

## 2023-09-11 DIAGNOSIS — I83029 Varicose veins of left lower extremity with ulcer of unspecified site: Secondary | ICD-10-CM | POA: Diagnosis not present

## 2023-09-11 DIAGNOSIS — L97919 Non-pressure chronic ulcer of unspecified part of right lower leg with unspecified severity: Secondary | ICD-10-CM | POA: Diagnosis not present

## 2023-09-11 DIAGNOSIS — I509 Heart failure, unspecified: Secondary | ICD-10-CM | POA: Diagnosis not present

## 2023-09-11 DIAGNOSIS — Z789 Other specified health status: Secondary | ICD-10-CM | POA: Diagnosis not present

## 2023-09-14 DIAGNOSIS — I83018 Varicose veins of right lower extremity with ulcer other part of lower leg: Secondary | ICD-10-CM | POA: Diagnosis not present

## 2023-09-14 DIAGNOSIS — I83028 Varicose veins of left lower extremity with ulcer other part of lower leg: Secondary | ICD-10-CM | POA: Diagnosis not present

## 2023-09-14 DIAGNOSIS — I890 Lymphedema, not elsewhere classified: Secondary | ICD-10-CM | POA: Diagnosis not present

## 2023-09-14 DIAGNOSIS — L97929 Non-pressure chronic ulcer of unspecified part of left lower leg with unspecified severity: Secondary | ICD-10-CM | POA: Diagnosis not present

## 2023-09-14 DIAGNOSIS — I83019 Varicose veins of right lower extremity with ulcer of unspecified site: Secondary | ICD-10-CM | POA: Diagnosis not present

## 2023-09-14 DIAGNOSIS — I83029 Varicose veins of left lower extremity with ulcer of unspecified site: Secondary | ICD-10-CM | POA: Diagnosis not present

## 2023-09-14 DIAGNOSIS — L97919 Non-pressure chronic ulcer of unspecified part of right lower leg with unspecified severity: Secondary | ICD-10-CM | POA: Diagnosis not present

## 2023-09-17 DIAGNOSIS — Z4789 Encounter for other orthopedic aftercare: Secondary | ICD-10-CM | POA: Diagnosis not present

## 2023-09-17 DIAGNOSIS — Z96611 Presence of right artificial shoulder joint: Secondary | ICD-10-CM | POA: Diagnosis not present

## 2023-09-17 DIAGNOSIS — M25611 Stiffness of right shoulder, not elsewhere classified: Secondary | ICD-10-CM | POA: Diagnosis not present

## 2023-09-17 DIAGNOSIS — R29898 Other symptoms and signs involving the musculoskeletal system: Secondary | ICD-10-CM | POA: Diagnosis not present

## 2023-09-17 DIAGNOSIS — Z789 Other specified health status: Secondary | ICD-10-CM | POA: Diagnosis not present

## 2023-09-18 NOTE — Progress Notes (Signed)
 Date:  09/28/2023   ID:  PAETON STUDER, DOB 29-Sep-1945, MRN [id number]  PCP:  Ezell Hollow, MD  Cardiologist:  Dr.Delia Sitar   Chief Complaint: Dyspnea  History of Present Illness:   78 y.o. remote stenting of LAD in [place] . History of DVT, HTN, HLD and CRF Chronic venous Disease with edema and right sided ulcers in past. Recurrent afib with [facility] 07/27/20 Recurrence flutter 07/22/21 Rx amiodarone  with conversion Issues affording DOAC Dr Marven Slimmer did not think ablation/Watchman warranted with diagnosis of Amyloid   With edema/fatigue/dyspnea and neuropathy concern for amyloid June 2023 right heart cath with 40 mm V waves despite TTE only showing mild MR Concern for amyloid with strain imaging showing classic pattern with apical sparing. PYP 10/13/21 positive and Cardiac MRI  10/20/21  markedly positive with diffuse LGE uptake EF 54% and ECV 50  Started on Tafamidis  10/31/21  Genetic testing TTR negative has maintained NSR on amiodarone  Taking Pradaxa  for anticoagulation Edema improved with demedex 20 mg daily    Concerned about long term side effects amiodarone  Has had Cr as high as 2.99 with GFR 21 and QRS 118 msec QT corrected for bradycardia in 500 range so Tikosyn not ideal. Has CAD with stenting of LAD LVH and preserved EF   He was in rate controlled afib today Has had some tinnitus/dysequilibrium Discussed recurrence of PAF  Seen by Dr Marven Slimmer EP 05/01/22 felt rate control appropriate and amiodarone  d/c Indicated not a good candidate for watchman given amyloid and kidney dx  Last Cr 2.0 08/29/23 On 20 mg demedex Discussed possible need to repeat MRI to see if he has had improvement amyloid on Tafamidis  he refused due to severe claustrophbia  Right shoulder arthroplasty with Dr Brunilda Capra 06/15/23 did well  Seen by CHF clinic 4/25 with volume overload. Demedex increased and given Furoscix  for 2 days. Started on Amvuttra  as well  Unfortunately did not make it to [place] for grand daughters wedding  TTE 07/2023 EF 40-45% severe LVH RV mod reduced severe bi atrial enlargement trivial MR.     Past Medical History:  Diagnosis Date   Arthritis    CAD (coronary artery disease)    Chronic kidney disease    DVT (deep venous thrombosis) (HCC)    following spinal surgery Nov 2017   HTN (hypertension)    MI (myocardial infarction) (HCC)    1999 ; stents placed    Numbness and tingling in both hands    Spondylosis of cervical spine     Past Surgical History:  Procedure Laterality Date   ANTERIOR CERVICAL DECOMP/DISCECTOMY FUSION N/A 06/27/2017   Procedure: ANTERIOR CERVICAL DECOMPRESSION/DISCECTOMY FUSION C3-4;  Surgeon: Mort Ards, MD;  [facility]: [facility] OR;  Service: Orthopedics;  Laterality: N/A;  3 hrs   ANTERIOR CERVICAL DECOMP/DISCECTOMY FUSION  01/24/2019   ANTERIOR LAT LUMBAR FUSION N/A 03/01/2016   Procedure: EXTREME LATERAL INTERBODY FUSION  LUMBAR 2-4;  Surgeon: Mort Ards, MD;  Location: [facility] OR;  Service: Orthopedics;  Laterality: N/A;   CARDIAC CATHETERIZATION     20 yrs ago   CARDIOVERSION N/A 07/27/2020   Procedure: CARDIOVERSION;  Surgeon: Loyde Rule, MD;  Location: [facility] ENDOSCOPY;  Service: Cardiovascular;  Laterality: N/A;   CARDIOVERSION N/A 08/05/2021   Procedure: CARDIOVERSION;  Surgeon: Jann Melody, MD;  Location: [facility] ENDOSCOPY;  Service: Cardiovascular;  Laterality: N/A;   JOINT REPLACEMENT     NECK HARDWARE REMOVAL  12/11/2018   RADIOLOGY WITH ANESTHESIA N/A 05/15/2017   Procedure:  MRI WITH ANESTHESIA CERVICAL SPINE WITH AND WITHOUT;  Surgeon: Radiologist, Medication, MD;  Location: [facility] OR;  Service: Radiology;  Laterality: N/A;   REVERSE SHOULDER ARTHROPLASTY Right 06/15/2023   Procedure: ARTHROPLASTY, SHOULDER, TOTAL, REVERSE;  Surgeon: Winston Hawking, MD;  Location: [facility] ORS;  Service: Orthopedics;  Laterality: Right;  choice with interscalene block   RIGHT HEART CATH N/A 09/28/2021   Procedure: RIGHT HEART CATH;  Surgeon: Mardell Shade, MD;   [facility]: [facility] INVASIVE CV LAB;  Service: Cardiovascular;  Laterality: N/A;   SPINAL FUSION N/A 03/02/2016   Procedure: POSTERIOR SPINAL FUSION INTERBODY L2-S1, DECOMPRESSION L4-S1;  Surgeon: Mort Ards, MD;  Location: [facility];  Service: Orthopedics;  Laterality: N/A;   TOTAL KNEE ARTHROPLASTY Right 04/30/2017   Procedure: RIGHT TOTAL KNEE ARTHROPLASTY;  Surgeon: Liliane Rei, MD;  Location: [facility] ORS;  Service: Orthopedics;  Laterality: Right;    Current Medications: Prior to Admission medications   Medication Sig Start Date End Date Taking? Authorizing Provider  Ascorbic Acid (VITAMIN C) 1000 MG tablet Take 1,000 mg by mouth daily.    [provider]  aspirin  EC 81 MG tablet Take 81 mg by mouth daily.    [provider]  furosemide  (LASIX ) 20 MG tablet Take 1 tablet (20 mg total) by mouth daily. 01/25/17   Ezell Hollow, MD  losartan  (COZAAR ) 100 MG tablet Take 1 tablet (100 mg total) by mouth daily. 01/05/17   Juliannah Ohmann C, MD  Multiple Vitamin (MULTIVITAMIN WITH MINERALS) TABS tablet Take 1 tablet by mouth daily.    [provider]  rosuvastatin  (CRESTOR ) 20 MG tablet Take 1 tablet (20 mg total) by mouth at bedtime. 01/24/17   Ezell Hollow, MD    Allergies:   Patient has no known allergies.   Social History   Socioeconomic History   Marital status: Married    Spouse name: Not on file   Number of children: 2   Years of education: Not on file   Highest education level: Not on file  Occupational History   Occupation: retired , Nature conservation officer busines   Tobacco Use   Smoking status: Former    Current packs/day: 0.00    Types: Cigarettes    Quit date: 04/26/1983    Years since quitting: 40.4   Smokeless tobacco: Never   Tobacco comments:    quit over 40 years ago   Vaping Use   Vaping status: Never Used  Substance and Sexual Activity   Alcohol use: No   Drug use: No   Sexual activity: Never  Other Topics Concern   Not on file  Social History Narrative    Household pt, wife    [place] from [place] 2011    Social Drivers of Health   Financial Resource Strain: Low Risk  (02/03/2022)   Overall Financial Resource Strain (CARDIA)    Difficulty of Paying Living Expenses: Not hard at all  Food Insecurity: No Food Insecurity (02/03/2022)   Hunger Vital Sign    Worried About Running Out of Food in the Last Year: Never true    Ran Out of Food in the Last Year: Never true  Transportation Needs: No Transportation Needs (02/03/2022)   PRAPARE - Administrator, Civil Service (Medical): No    Lack of Transportation (Non-Medical): No  Physical Activity: Inactive (02/03/2022)   Exercise Vital Sign    Days of Exercise per Week: 0 days    Minutes of Exercise per Session: 0 min  Stress: No  Stress Concern Present (02/03/2022)   [employer] of Occupational Health - Occupational Stress Questionnaire    Feeling of Stress : Not at all  Social Connections: Unknown (08/27/2022)   Received from [facility]   Social Network    Social Network: Not on file     Family History:  The patient's family history includes Cancer - Other in his mother; Heart attack in his father; Uterine cancer in his sister.  ROS:   Please see the history of present illness.    ROS All other systems reviewed and are negative.   PHYSICAL EXAM:   VS:  BP 102/64   Pulse 92   Ht 5' 8 (1.727 m)   SpO2 98%   BMI 28.00 kg/m     Affect appropriate Healthy:  appears stated age HEENT: normal Neck supple with no adenopathy JVP normal no bruits no thyromegaly Lungs clear with no wheezing and good diaphragmatic motion Heart:  S1/S2 no murmur, no rub, gallop or click PMI normal Abdomen: benighn, BS positve, no tenderness, no AAA no bruit.  No HSM or HJR Distal pulses intact with no bruits Plus 2 bilateral edema with stasis LLE worse than right both wrapped  Neuro non-focal post lumbar surgery  Skin warm and dry Post R TKR  Post R shoulder arthroplasty    Wt  Readings from Last 3 Encounters:  09/28/23 184 lb 2 oz (83.5 kg)  08/21/23 180 lb (81.6 kg)  08/13/23 186 lb 6.4 oz (84.6 kg)      Studies/Labs Reviewed:   EKG:    afib rate 80 IVCD   Recent Labs: 07/31/2023: Hemoglobin 9.1; Magnesium  2.4; Platelets 189.0; Pro B Natriuretic peptide (BNP) 482.0; TSH 2.68 08/21/2023: B Natriuretic Peptide 377.8 08/29/2023: BUN 67; Creatinine, Ser 2.08; Potassium 4.1; Sodium 140   Lipid Panel    Component Value Date/Time   CHOL 104 08/15/2022 1337   CHOL 166 01/15/2018 1516   TRIG 60.0 08/15/2022 1337   HDL 42.30 08/15/2022 1337   HDL 42 01/15/2018 1516   CHOLHDL 2 08/15/2022 1337   VLDL 12.0 08/15/2022 1337   LDLCALC 49 08/15/2022 1337   LDLCALC 86 01/15/2018 1516    Additional studies/ records that were reviewed today include:  AS above    ASSESSMENT & PLAN:     1. CAD s/p remote LAD stenting -  No chest pain. ETT 02/23/17 non ischemic continue ASA and beta blocker and statin   2. HTN- Well controlled.  Continue current medications and low sodium Dash type diet.    3. HLD-  Continue statin labs with primary   4. Ortho:  Post right TKR continue PT/OT     5. Neuropathy:  Severe cervical disease with dense sensory deficit in both UE;s Post cervical decompression F/U Dr Vaughn Georges He has stopped Gabapentin  on his Own as it made him feel poorly ? Related to amyloid But genetic testing negative Amvuttra  started by DB  6. PAF: DCC on 07/27/20  Difficulty affording DOAC Repeat DCC on amiodarone   08/05/21 successful Tikosyn not ideal see above Limited ability to change AAT Discussed with Dr Marven Slimmer  Back in afib today much less symptomatic D/C amiodarone  with tinnitus/dysequilibrium and lack of efficacy Rate control and anticoagulation no further attempts at conversion per Dr Marven Slimmer   7. Amyloid:  Unifying diagnosis for diastolic dysfunction, neuropathy, PAF CKD  on Tafamidis   and Amvuttra   Former stabilizes TTR protein and latter decreases production.  Concern for recent CHf, drop in EF, worse neuropathy  and continued severe LVH   8. CRF: Cr baseline 2.0  ON demedex 80 am and 40 mg pm   9. Orhto: post right total shoulder arthroplasty f/u [facility] PT/OT  10: Peripheral edema:  multifactorial continue diuretics improved f/u wound clinic     F/U in 6 months with CHF clinic and me in a year   [employer]

## 2023-09-20 DIAGNOSIS — R911 Solitary pulmonary nodule: Secondary | ICD-10-CM | POA: Diagnosis not present

## 2023-09-20 DIAGNOSIS — I129 Hypertensive chronic kidney disease with stage 1 through stage 4 chronic kidney disease, or unspecified chronic kidney disease: Secondary | ICD-10-CM | POA: Diagnosis not present

## 2023-09-20 DIAGNOSIS — R42 Dizziness and giddiness: Secondary | ICD-10-CM | POA: Diagnosis not present

## 2023-09-20 DIAGNOSIS — Z87891 Personal history of nicotine dependence: Secondary | ICD-10-CM | POA: Diagnosis not present

## 2023-09-20 DIAGNOSIS — N189 Chronic kidney disease, unspecified: Secondary | ICD-10-CM | POA: Diagnosis not present

## 2023-09-21 DIAGNOSIS — I872 Venous insufficiency (chronic) (peripheral): Secondary | ICD-10-CM | POA: Diagnosis not present

## 2023-09-21 DIAGNOSIS — I739 Peripheral vascular disease, unspecified: Secondary | ICD-10-CM | POA: Diagnosis not present

## 2023-09-21 DIAGNOSIS — L97811 Non-pressure chronic ulcer of other part of right lower leg limited to breakdown of skin: Secondary | ICD-10-CM | POA: Diagnosis not present

## 2023-09-21 DIAGNOSIS — I890 Lymphedema, not elsewhere classified: Secondary | ICD-10-CM | POA: Diagnosis not present

## 2023-09-24 DIAGNOSIS — Z4789 Encounter for other orthopedic aftercare: Secondary | ICD-10-CM | POA: Diagnosis not present

## 2023-09-24 DIAGNOSIS — M25611 Stiffness of right shoulder, not elsewhere classified: Secondary | ICD-10-CM | POA: Diagnosis not present

## 2023-09-24 DIAGNOSIS — R29898 Other symptoms and signs involving the musculoskeletal system: Secondary | ICD-10-CM | POA: Diagnosis not present

## 2023-09-24 DIAGNOSIS — Z789 Other specified health status: Secondary | ICD-10-CM | POA: Diagnosis not present

## 2023-09-24 DIAGNOSIS — Z96611 Presence of right artificial shoulder joint: Secondary | ICD-10-CM | POA: Diagnosis not present

## 2023-09-25 DIAGNOSIS — I4891 Unspecified atrial fibrillation: Secondary | ICD-10-CM | POA: Diagnosis not present

## 2023-09-25 DIAGNOSIS — I2109 ST elevation (STEMI) myocardial infarction involving other coronary artery of anterior wall: Secondary | ICD-10-CM | POA: Diagnosis not present

## 2023-09-27 DIAGNOSIS — I890 Lymphedema, not elsewhere classified: Secondary | ICD-10-CM | POA: Diagnosis not present

## 2023-09-27 DIAGNOSIS — I872 Venous insufficiency (chronic) (peripheral): Secondary | ICD-10-CM | POA: Diagnosis not present

## 2023-09-27 DIAGNOSIS — I83029 Varicose veins of left lower extremity with ulcer of unspecified site: Secondary | ICD-10-CM | POA: Diagnosis not present

## 2023-09-27 DIAGNOSIS — L97919 Non-pressure chronic ulcer of unspecified part of right lower leg with unspecified severity: Secondary | ICD-10-CM | POA: Diagnosis not present

## 2023-09-27 DIAGNOSIS — I739 Peripheral vascular disease, unspecified: Secondary | ICD-10-CM | POA: Diagnosis not present

## 2023-09-27 DIAGNOSIS — I83019 Varicose veins of right lower extremity with ulcer of unspecified site: Secondary | ICD-10-CM | POA: Diagnosis not present

## 2023-09-27 DIAGNOSIS — L97929 Non-pressure chronic ulcer of unspecified part of left lower leg with unspecified severity: Secondary | ICD-10-CM | POA: Diagnosis not present

## 2023-09-27 DIAGNOSIS — L97811 Non-pressure chronic ulcer of other part of right lower leg limited to breakdown of skin: Secondary | ICD-10-CM | POA: Diagnosis not present

## 2023-09-27 NOTE — Telephone Encounter (Signed)
 Opened in error

## 2023-09-28 ENCOUNTER — Ambulatory Visit (INDEPENDENT_AMBULATORY_CARE_PROVIDER_SITE_OTHER): Admitting: Internal Medicine

## 2023-09-28 ENCOUNTER — Ambulatory Visit: Payer: Medicare Other | Attending: Cardiovascular Disease | Admitting: Cardiovascular Disease

## 2023-09-28 ENCOUNTER — Encounter: Payer: Self-pay | Admitting: Internal Medicine

## 2023-09-28 VITALS — BP 102/64 | HR 92 | Ht 68.0 in

## 2023-09-28 VITALS — BP 122/68 | HR 107 | Temp 97.8°F | Resp 16 | Ht 68.5 in | Wt 184.1 lb

## 2023-09-28 DIAGNOSIS — E785 Hyperlipidemia, unspecified: Secondary | ICD-10-CM

## 2023-09-28 DIAGNOSIS — R911 Solitary pulmonary nodule: Secondary | ICD-10-CM | POA: Diagnosis not present

## 2023-09-28 DIAGNOSIS — I480 Paroxysmal atrial fibrillation: Secondary | ICD-10-CM | POA: Insufficient documentation

## 2023-09-28 DIAGNOSIS — I10 Essential (primary) hypertension: Secondary | ICD-10-CM

## 2023-09-28 DIAGNOSIS — E8582 Wild-type transthyretin-related (ATTR) amyloidosis: Secondary | ICD-10-CM | POA: Insufficient documentation

## 2023-09-28 DIAGNOSIS — I4821 Permanent atrial fibrillation: Secondary | ICD-10-CM | POA: Diagnosis not present

## 2023-09-28 DIAGNOSIS — R42 Dizziness and giddiness: Secondary | ICD-10-CM

## 2023-09-28 DIAGNOSIS — I5032 Chronic diastolic (congestive) heart failure: Secondary | ICD-10-CM | POA: Diagnosis not present

## 2023-09-28 DIAGNOSIS — M542 Cervicalgia: Secondary | ICD-10-CM

## 2023-09-28 DIAGNOSIS — D649 Anemia, unspecified: Secondary | ICD-10-CM

## 2023-09-28 NOTE — Patient Instructions (Addendum)
 Medication Instructions:  Your physician recommends that you continue on your current medications as directed. Please refer to the Current Medication list given to you today.  *If you need a refill on your cardiac medications before your next appointment, please call your pharmacy*  Lab Work: If you have labs (blood work) drawn today and your tests are completely normal, you will receive your results only by: MyChart Message (if you have MyChart) OR A paper copy in the mail If you have any lab test that is abnormal or we need to change your treatment, we will call you to review the results.  Testing/Procedures: None ordered today.  Follow-Up: At [facility], you and your health needs are our priority.  As part of our continuing mission to provide you with exceptional heart care, our providers are all part of one team.  This team includes your primary Cardiologist (physician) and Advanced Practice Providers or APPs (Physician Assistants and Nurse Practitioners) who all work together to provide you with the care you need, when you need it.  Your next appointment:   3 months  Provider:   Janelle Mediate, MD    We recommend signing up for the patient portal called MyChart.  Sign up information is provided on this After Visit Summary.  MyChart is used to connect with patients for Virtual Visits (Telemedicine).  Patients are able to view lab/test results, encounter notes, upcoming appointments, etc.  Non-urgent messages can be sent to your provider as well.   To learn more about what you can do with MyChart, go to [email or web address].   Other Instructions Your physician recommends that you schedule a follow-up appointment in: 6 to 8 weeks with Dr. Bensimhon.

## 2023-09-28 NOTE — Patient Instructions (Signed)
 Will refer you to orthopedics for neck pain  Will refer you for a CT of your chest to investigate the nodule found on a chest x-ray        GO TO THE LAB :  Get the blood work   Your results will be posted on MyChart with my comments  Next office visit for a checkup in 4 months Please make an appointment before you leave today

## 2023-09-28 NOTE — Progress Notes (Unsigned)
 Subjective:    Patient ID: Gregory Guerrero, male    DOB: [birth year], 78 y.o.   MRN: [id number]  DOS:  09/28/2023 Type of visit - description: Here with his wife.  Multiple issues discussed.  Having neck pain, left-sided for about 2 months. Increases with neck motion. Denies arm paresthesias.  Also concerned about his memory, sometimes forgets names and has difficulty finding words.  The wife is here, they denied any episodes of confusion or getting lost.  Having occasional dizzy spells He get dizzy standing up, symptoms last few seconds and that is going on for years. More recently he gets dizzy by walking.  May last 5 minutes.  Symptoms are not consistent. No associated diplopia, slurred speech, facial numbness or weakness. He feels somewhat imbalance and that is not a new problem.  Mild anemia noted, recently had several bouts of epistaxis --- some  severe.  Denies nausea vomiting diarrhea.  No blood in the stools.   Wt Readings from Last 3 Encounters:  09/28/23 184 lb 2 oz (83.5 kg)  08/21/23 180 lb (81.6 kg)  08/13/23 186 lb 6.4 oz (84.6 kg)     Review of Systems See above   Past Medical History:  Diagnosis Date   Arthritis    CAD (coronary artery disease)    Chronic kidney disease    DVT (deep venous thrombosis) (HCC)    following spinal surgery Nov 2017   HTN (hypertension)    MI (myocardial infarction) (HCC)    1999 ; stents placed    Numbness and tingling in both hands    Spondylosis of cervical spine     Past Surgical History:  Procedure Laterality Date   ANTERIOR CERVICAL DECOMP/DISCECTOMY FUSION N/A 06/27/2017   Procedure: ANTERIOR CERVICAL DECOMPRESSION/DISCECTOMY FUSION C3-4;  Surgeon: Mort Ards, MD;  Location: [facility] OR;  Service: Orthopedics;  Laterality: N/A;  3 hrs   ANTERIOR CERVICAL DECOMP/DISCECTOMY FUSION  01/24/2019   ANTERIOR LAT LUMBAR FUSION N/A 03/01/2016   Procedure: EXTREME LATERAL INTERBODY FUSION  LUMBAR 2-4;  Surgeon: Mort Ards,  MD;  Location: [facility] OR;  Service: Orthopedics;  Laterality: N/A;   CARDIAC CATHETERIZATION     20 yrs ago   CARDIOVERSION N/A 07/27/2020   Procedure: CARDIOVERSION;  Surgeon: Loyde Rule, MD;  Location: [facility] ENDOSCOPY;  Service: Cardiovascular;  Laterality: N/A;   CARDIOVERSION N/A 08/05/2021   Procedure: CARDIOVERSION;  Surgeon: Jann Melody, MD;  Location: [facility] ENDOSCOPY;  Service: Cardiovascular;  Laterality: N/A;   JOINT REPLACEMENT     NECK HARDWARE REMOVAL  12/11/2018   RADIOLOGY WITH ANESTHESIA N/A 05/15/2017   Procedure: MRI WITH ANESTHESIA CERVICAL SPINE WITH AND WITHOUT;  Surgeon: Radiologist, Medication, MD;  Location: [facility] OR;  Service: Radiology;  Laterality: N/A;   REVERSE SHOULDER ARTHROPLASTY Right 06/15/2023   Procedure: ARTHROPLASTY, SHOULDER, TOTAL, REVERSE;  Surgeon: Winston Hawking, MD;  Location: [facility] ORS;  Service: Orthopedics;  Laterality: Right;  choice with interscalene block   RIGHT HEART CATH N/A 09/28/2021   Procedure: RIGHT HEART CATH;  Surgeon: Mardell Shade, MD;  [facility] INVASIVE CV LAB;  Service: Cardiovascular;  Laterality: N/A;   SPINAL FUSION N/A 03/02/2016   Procedure: POSTERIOR SPINAL FUSION INTERBODY L2-S1, DECOMPRESSION L4-S1;  Surgeon: Mort Ards, MD;  Location: [facility] OR;  Service: Orthopedics;  Laterality: N/A;   TOTAL KNEE ARTHROPLASTY Right 04/30/2017   Procedure: RIGHT TOTAL KNEE ARTHROPLASTY;  Surgeon: Liliane Rei, MD;  Location: [facility] ORS;  Service: Orthopedics;  Laterality: Right;  Current Outpatient Medications  Medication Instructions   acetaminophen  (TYLENOL ) 650 mg, Every 8 hours PRN   Amvuttra  25 mg, Subcutaneous, Every 3 months   aspirin  EC 81 mg, Daily at bedtime   atorvastatin  (LIPITOR) 80 MG tablet TAKE 1 TABLET(80MG ) BY MOUTH EVERY OTHER DAY AND TAKE 1/2 TABLET BY MOUTH ON ALTERNATE DAYS   FIBER PO 1 Dose, Daily   Furoscix  80 mg, Subcutaneous, As directed   Multiple Vitamin (MULTIVITAMIN WITH MINERALS) TABS tablet 1  tablet, Every morning   potassium chloride  SA (KLOR-CON  M) 20 MEQ tablet 40 mEq, Oral, As directed, When you take Metolazone    Rivaroxaban  (XARELTO ) 15 mg, Oral, Daily with supper   Tafamidis  (VYNDAMAX ) 61 MG CAPS Take 1 capsule by mouth daily.   torsemide  (DEMADEX ) 20 MG tablet TAKE 80MG  IN THE MORNING AND 40MG  IN THE EVENING   vitamin C 1,000 mg, Daily       Objective:   Physical Exam BP 122/68   Pulse (!) 107   Temp 97.8 F (36.6 C) (Oral)   Resp 16   Ht 5' 8.5 (1.74 m)   Wt 184 lb 2 oz (83.5 kg)   SpO2 97%   BMI 27.59 kg/m  General:   Well developed, NAD, BMI noted. HEENT:  Normocephalic . Face symmetric, atraumatic. Neck: No TTP at the cervical spine, ROM mildly limited throughout. Lungs:  CTA B Normal respiratory effort, no intercostal retractions, no accessory muscle use. Heart: Irregularly irregular Lower extremities: Both legs are wrapped, some edema noted. Skin: Not pale. Not jaundice Neurologic:  alert & oriented X3.  Speech normal, gait appropriate for age and unassisted. Motor symmetric. Psych--  Cognition and judgment appear intact.  Cooperative with normal attention span and concentration.  Behavior appropriate. No anxious or depressed appearing.      Assessment    Assessment--- new patient 01/24/2017 HTN, dx ~ 2010 Hyperlipidemia CKD CV: -- CAD, history of remote MI; EET 02-23-17 w/ no ischemia - Diastolic CHF - Paroxysmal A-fib Dx 05-2020 during routine, visit - Cardiac amyloidosis dx ~ 2023 -- H/o L  DVT ~ 02/2016 after back surgery - Chronic venous dz BCC, L face, last derm note 2014 H/o elevated PSA per urology note 2014, DRE normal, PSA was 3.7.  Cervical myelopathy: Surgery 06/27/2017: Anterior cervical decompression and discectomy with fusion of C3-C4 Lower extremity ulcer, right leg after an injury    PLAN Neck pain: As described above, he has a history of surgery for cervical myelopathy, recommend to see Ortho.  Referral  placed. MCI: Minimal memory issues, see HPI, offered referral, declined, okay to take Prevagen if so desire High cholesterol: On atorvastatin , check FLP AST ALT CAD, PAF, diastolic CHF, cardiac amyloidosis:  Saw cardiology 08/21/2023. Torsemide  increased to 80/40.  Did not tolerate Jardiance . At this point only takes lasix  80 mg every day, has not taken potassium in a while ; last BMP ok Pradaxa  switched to Xarelto  d/t   nosebleeds. Further management per cardiology Venous ulcer bilateral legs, lymphedema: ABIs 09/14/2023: Normal Since the last visit saw wound care office several times, LOV yesterday, Was recommended Vaseline with econazole powder bilaterally. coban  Regular. Dizzines--  went to the ER: 09/20/2023. Brain MRI without: No acute The dizziness that last few seconds after standing is likely benign. The dizziness that last slightly longer is of unclear etiology, offered neurology referral for clarification.  Declined . Pulmonary nodule: Found incidentally during ER visit 09/20/2023. Plan: Schedule CT chest as recommended. Epistaxis: Has seen ENT.  Symptoms are now resolved Anemia: Per chart review, no GI symptoms, related to epistaxis?  Checking a CBC. RTC 4 months  Time spent 40 min, multiple acute-chronic issues addressed, extensive chart review

## 2023-09-29 LAB — CBC WITH DIFFERENTIAL/PLATELET
Absolute Lymphocytes: 1067 {cells}/uL (ref 850–3900)
Absolute Monocytes: 682 {cells}/uL (ref 200–950)
Basophils Absolute: 22 {cells}/uL (ref 0–200)
Basophils Relative: 0.4 %
Eosinophils Absolute: 83 {cells}/uL (ref 15–500)
Eosinophils Relative: 1.5 %
HCT: 30.4 % — ABNORMAL LOW (ref 38.5–50.0)
Hemoglobin: 8.9 g/dL — ABNORMAL LOW (ref 13.2–17.1)
MCH: 26.8 pg — ABNORMAL LOW (ref 27.0–33.0)
MCHC: 29.3 g/dL — ABNORMAL LOW (ref 32.0–36.0)
MCV: 91.6 fL (ref 80.0–100.0)
MPV: 10.6 fL (ref 7.5–12.5)
Monocytes Relative: 12.4 %
Neutro Abs: 3647 {cells}/uL (ref 1500–7800)
Neutrophils Relative %: 66.3 %
Platelets: 173 10*3/uL (ref 140–400)
RBC: 3.32 10*6/uL — ABNORMAL LOW (ref 4.20–5.80)
RDW: 13.6 % (ref 11.0–15.0)
Total Lymphocyte: 19.4 %
WBC: 5.5 10*3/uL (ref 3.8–10.8)

## 2023-09-29 LAB — LIPID PANEL
Cholesterol: 111 mg/dL (ref <200)
HDL: 43 mg/dL (ref >=40)
LDL Cholesterol (Calc): 49 mg/dL
Non-HDL Cholesterol (Calc): 68 mg/dL (ref <130)
Total CHOL/HDL Ratio: 2.6 (calc) (ref <5.0)
Triglycerides: 105 mg/dL (ref <150)

## 2023-09-29 LAB — AST: AST: 12 U/L (ref 10–35)

## 2023-09-29 LAB — ALT: ALT: 8 U/L — ABNORMAL LOW (ref 9–46)

## 2023-09-29 NOTE — Assessment & Plan Note (Signed)
 Neck pain: As described above, he has a history of surgery for cervical myelopathy, recommend to see Ortho.  Referral placed. MCI: Minimal memory issues, see HPI, offered referral, declined, okay to take Prevagen if so desire High cholesterol: On atorvastatin , check FLP AST ALT CAD, PAF, diastolic CHF, cardiac amyloidosis:  Saw cardiology 08/21/2023. Torsemide  increased to 80/40.  Did not tolerate Jardiance . At this point only takes lasix  80 mg every day, has not taken potassium in a while ; last BMP ok Pradaxa  switched to Xarelto  d/t   nosebleeds. Further management per cardiology Venous ulcer bilateral legs, lymphedema: ABIs 09/14/2023: Normal Since the last visit saw wound care office several times, LOV yesterday, Was recommended Vaseline with econazole powder bilaterally. coban  Regular. Dizzines--  went to the ER: 09/20/2023. Brain MRI without: No acute The dizziness that last few seconds after standing is likely benign. The dizziness that last slightly longer is of unclear etiology, offered neurology referral for clarification.  Declined . Pulmonary nodule: Found incidentally during ER visit 09/20/2023. Plan: Schedule CT chest as recommended. Epistaxis: Has seen ENT.  Symptoms are now resolved Anemia: Per chart review, no GI symptoms, related to epistaxis?  Checking a CBC. RTC 4 months

## 2023-09-30 ENCOUNTER — Ambulatory Visit: Payer: Self-pay | Admitting: Internal Medicine

## 2023-10-01 MED ORDER — FERROUS FUMARATE 325 (106 FE) MG PO TABS: 1.0000 | ORAL_TABLET | Freq: Every day | ORAL | 1 refills | Status: DC

## 2023-10-02 ENCOUNTER — Telehealth (HOSPITAL_COMMUNITY): Payer: Self-pay | Admitting: Vascular Surgery

## 2023-10-04 DIAGNOSIS — I890 Lymphedema, not elsewhere classified: Secondary | ICD-10-CM | POA: Diagnosis not present

## 2023-10-04 DIAGNOSIS — I739 Peripheral vascular disease, unspecified: Secondary | ICD-10-CM | POA: Diagnosis not present

## 2023-10-04 DIAGNOSIS — L97811 Non-pressure chronic ulcer of other part of right lower leg limited to breakdown of skin: Secondary | ICD-10-CM | POA: Diagnosis not present

## 2023-10-04 DIAGNOSIS — I872 Venous insufficiency (chronic) (peripheral): Secondary | ICD-10-CM | POA: Diagnosis not present

## 2023-10-08 NOTE — Telephone Encounter (Signed)
 Encounter opened in error

## 2023-10-11 ENCOUNTER — Ambulatory Visit
Admission: RE | Admit: 2023-10-11 | Discharge: 2023-10-11 | Disposition: A | Discharge status: Home / Self Care | Source: Ambulatory Visit | Attending: Internal Medicine | Admitting: Internal Medicine

## 2023-10-11 DIAGNOSIS — R918 Other nonspecific abnormal finding of lung field: Secondary | ICD-10-CM | POA: Diagnosis not present

## 2023-10-15 ENCOUNTER — Other Ambulatory Visit: Payer: Self-pay

## 2023-10-15 MED ORDER — ATORVASTATIN CALCIUM 80 MG PO TABS: ORAL_TABLET | ORAL | 3 refills | Status: AC

## 2023-10-31 ENCOUNTER — Telehealth (HOSPITAL_COMMUNITY): Payer: Self-pay | Admitting: Internal Medicine

## 2023-10-31 NOTE — Telephone Encounter (Signed)
 Called to confirm/remind patient of their appointment at [facility] on 10/31/23.   Appointment:   [x] Confirmed  [] Left mess   [] No answer/No voice mail  [] VM Full/unable to leave message  [] Phone not in service  Patient reminded to bring all medications and/or complete list.  Confirmed patient has transportation. Gave directions, instructed to utilize valet parking.

## 2023-11-01 ENCOUNTER — Encounter (HOSPITAL_COMMUNITY): Payer: Self-pay | Admitting: Internal Medicine

## 2023-11-01 ENCOUNTER — Ambulatory Visit (HOSPITAL_COMMUNITY)
Admission: RE | Admit: 2023-11-01 | Discharge: 2023-11-01 | Disposition: A | Discharge status: Home / Self Care | Source: Ambulatory Visit | Attending: Internal Medicine | Admitting: Internal Medicine

## 2023-11-01 VITALS — BP 128/68 | HR 88 | Ht 68.0 in | Wt 186.0 lb

## 2023-11-01 DIAGNOSIS — E854 Organ-limited amyloidosis: Secondary | ICD-10-CM | POA: Insufficient documentation

## 2023-11-01 DIAGNOSIS — Z7901 Long term (current) use of anticoagulants: Secondary | ICD-10-CM | POA: Insufficient documentation

## 2023-11-01 DIAGNOSIS — I130 Hypertensive heart and chronic kidney disease with heart failure and stage 1 through stage 4 chronic kidney disease, or unspecified chronic kidney disease: Secondary | ICD-10-CM | POA: Diagnosis not present

## 2023-11-01 DIAGNOSIS — I2510 Atherosclerotic heart disease of native coronary artery without angina pectoris: Secondary | ICD-10-CM | POA: Insufficient documentation

## 2023-11-01 DIAGNOSIS — Z87891 Personal history of nicotine dependence: Secondary | ICD-10-CM | POA: Insufficient documentation

## 2023-11-01 DIAGNOSIS — N184 Chronic kidney disease, stage 4 (severe): Secondary | ICD-10-CM | POA: Diagnosis not present

## 2023-11-01 DIAGNOSIS — I5032 Chronic diastolic (congestive) heart failure: Secondary | ICD-10-CM | POA: Diagnosis not present

## 2023-11-01 DIAGNOSIS — M2550 Pain in unspecified joint: Secondary | ICD-10-CM | POA: Insufficient documentation

## 2023-11-01 DIAGNOSIS — I43 Cardiomyopathy in diseases classified elsewhere: Secondary | ICD-10-CM | POA: Insufficient documentation

## 2023-11-01 DIAGNOSIS — Z86718 Personal history of other venous thrombosis and embolism: Secondary | ICD-10-CM | POA: Diagnosis not present

## 2023-11-01 DIAGNOSIS — Z955 Presence of coronary angioplasty implant and graft: Secondary | ICD-10-CM | POA: Diagnosis not present

## 2023-11-01 DIAGNOSIS — Z79899 Other long term (current) drug therapy: Secondary | ICD-10-CM | POA: Insufficient documentation

## 2023-11-01 DIAGNOSIS — I5022 Chronic systolic (congestive) heart failure: Secondary | ICD-10-CM | POA: Diagnosis not present

## 2023-11-01 DIAGNOSIS — Z8249 Family history of ischemic heart disease and other diseases of the circulatory system: Secondary | ICD-10-CM | POA: Diagnosis not present

## 2023-11-01 DIAGNOSIS — I4821 Permanent atrial fibrillation: Secondary | ICD-10-CM | POA: Diagnosis not present

## 2023-11-01 NOTE — Progress Notes (Signed)
 ADVANCED HF CLINIC NOTE   PCP: Amon Aloysius BRAVO, MD Primary Cardiologist: Maude Emmer, MD HF Cardiologist: Dr. Cherrie  HPI: 78 y.o. male with CAD s/p remote stenting of LAD in [place], previous DVT, HTN, CKD IV, PAF and diastolic HF.   Echo 02/01/17 EF 50% grade 2 diastolic mild MR   ETT 02/23/17 normal    Noted new onset AF 07/27/20, started on renal dose Xarelto  15 mg daily. -> s/p DCCV 07/27/20 with conversion to NSR.    In 07/22/21 recurrent AF/AFL  -> Had successful repeat DCCV on 08/05/21 Done on amiodarone  200 mg daily and pradaxa  150 mg bid.  Following with Dr. Cindie for possible Watchman  Referred to Dr Nader Boys for RHC in 6/23 due to LE edema. RHC with prominent v-waves in PCWP tracing suspicious for MR vs severe diastolic dysfunction. TTE with mild MR. 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   RHC 09/28/21  RA = 7 RV = 48/8 PA = 52/16 (34) PCW = 22 (v = 40) Fick cardiac output/index = 4.1/2.0 PVR = 3.2 FA sat = 97% PA sat = 61%, 58%  Echo 6/23 EF 60% strain with cherry on top. RV low normal Mild MR  PYP 10/13/21 markedly positive    cMRI 10/20/21: EF 54% RV 51% Diffuse LGE with ECV 50%  Saw Dr. Timmy Serum light chains mildly elevated. Urine IFE normal . Felt not to have light chain amyloidosis. BMBx negatvie  Follow up 4/25, markedly volume overloaded. Torsemide  increased to 60 mg daily, instructed to take metolazone  2.5 mg/40 KCL x 3 days. Close follow up 3 days later, remained volume up and instructed to use Furoscix  x 2 days, then increase torsemide  to 80 mg daily. Started on Amvuttra .   Today he returns for HF follow up.Overall feeling fine. Denies SOB/PND/Orthopnea. Spends most of his time in the chair. Limited by joint pain. Denies numbness in fingers. Appetite ok. No fever or chills. Weight at home 175-180   pounds. Taking all medications  Cardiac Studies - Echo 4/25: EF 40-45%, severe LV thickening, RV moderate HK, severe RAE  - cMRI 7/23: LVEF 54%, RVEF  51%, diffuse LGE with ECV 50%  - PYP 6/23: markedly positive  - Echo 6/23: EF 60% strain with cherry on top, RV low/normal, mild MR  - RHC 6/23: RA 7, PA 52/16 (34), PCWP 22, CO/CI (Fick) 4.1/2.0, PVR 3.2  - ETT 02/23/17 normal   - Echo 10/18: EF 50% G2DD, mild MR    Past Medical History:  Diagnosis Date   Arthritis    CAD (coronary artery disease)    Chronic kidney disease    DVT (deep venous thrombosis) (HCC)    following spinal surgery Nov 2017   HTN (hypertension)    MI (myocardial infarction) (HCC)    1999 ; stents placed    Numbness and tingling in both hands    Spondylosis of cervical spine    Current Outpatient Medications  Medication Sig Dispense Refill   acetaminophen  (TYLENOL ) 650 MG CR tablet Take 650 mg by mouth every 8 (eight) hours as needed for pain.     Ascorbic Acid (VITAMIN C) 1000 MG tablet Take 1,000 mg by mouth daily.     aspirin  EC 81 MG tablet Take 81 mg by mouth at bedtime.     atorvastatin  (LIPITOR) 80 MG tablet TAKE 1 TABLET(80MG ) BY MOUTH EVERY OTHER DAY AND TAKE 1/2 TABLET BY MOUTH ON ALTERNATE DAYS 90 tablet 3   FIBER PO Take 1  Dose by mouth daily.     Furosemide  (FUROSCIX ) 80 MG/10ML CTKT Inject 80 mg into the skin as directed.     Multiple Vitamin (MULTIVITAMIN WITH MINERALS) TABS tablet Take 1 tablet by mouth in the morning.     potassium chloride  SA (KLOR-CON  M) 20 MEQ tablet Take 2 tablets (40 mEq total) by mouth as directed. When you take Metolazone  10 tablet 1   Rivaroxaban  (XARELTO ) 15 MG TABS tablet Take 1 tablet (15 mg total) by mouth daily with supper. 30 tablet 11   Tafamidis  (VYNDAMAX ) 61 MG CAPS Take 1 capsule by mouth daily. 90 capsule 3   torsemide  (DEMADEX ) 20 MG tablet Take 80 mg by mouth daily.     vutrisiran  sodium (AMVUTTRA ) 25 MG/0.5ML syringe Inject 0.5 mLs (25 mg total) into the skin every 3 (three) months. 0.5 mL 3   No current facility-administered medications for this encounter.   No Known Allergies  Social History    Socioeconomic History   Marital status: Married    Spouse name: Not on file   Number of children: 2   Years of education: Not on file   Highest education level: Not on file  Occupational History   Occupation: retired , Nature conservation officer busines   Tobacco Use   Smoking status: Former    Current packs/day: 0.00    Types: Cigarettes    Quit date: 04/26/1983    Years since quitting: 40.5   Smokeless tobacco: Never   Tobacco comments:    quit over 40 years ago   Vaping Use   Vaping status: Never Used  Substance and Sexual Activity   Alcohol use: No   Drug use: No   Sexual activity: Never  Other Topics Concern   Not on file  Social History Narrative   Household pt, wife    [place] from [place] 2011    Social Drivers of Health   Financial Resource Strain: Low Risk  (02/03/2022)   Overall Financial Resource Strain (CARDIA)    Difficulty of Paying Living Expenses: Not hard at all  Food Insecurity: No Food Insecurity (02/03/2022)   Hunger Vital Sign    Worried About Running Out of Food in the Last Year: Never true    Ran Out of Food in the Last Year: Never true  Transportation Needs: No Transportation Needs (02/03/2022)   PRAPARE - Administrator, Civil Service (Medical): No    Lack of Transportation (Non-Medical): No  Physical Activity: Inactive (02/03/2022)   Exercise Vital Sign    Days of Exercise per Week: 0 days    Minutes of Exercise per Session: 0 min  Stress: No Stress Concern Present (02/03/2022)   [employer] of Occupational Health - Occupational Stress Questionnaire    Feeling of Stress : Not at all  Social Connections: Unknown (08/27/2022)   [facility]   Social Network    Social Network: Not on file  Intimate Partner Violence: Unknown (08/27/2022)   Received from [facility]   HITS    Physically Hurt: Not on file    Insult or Talk Down To: Not on file    Threaten Physical Harm: Not on file    Scream or Curse: Not on file   Family  History  Problem Relation Age of Onset   Cancer - Other Mother    Heart attack Father    Uterine cancer Sister    Colon cancer Neg Hx    Prostate cancer Neg Hx  BP 128/68   Pulse 88   Ht 5' 8 (1.727 m)   Wt 84.4 kg (186 lb)   SpO2 98%   BMI 28.28 kg/m   Wt Readings from Last 3 Encounters:  11/01/23 84.4 kg (186 lb)  09/28/23 83.5 kg (184 lb 2 oz)  08/21/23 81.6 kg (180 lb)   PHYSICAL EXAM: General:   No resp difficulty Neck:  no JVD.  Cor:  Irregular rate & rhythm.  Lungs: clear Abdomen: soft, nontender, nondistended.  Extremities: no  edema Neuro: alert & oriented x3  ASSESSMENT & PLAN:  1. 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 RV low normal Mild MR - PYP 10/13/21  markedly positive  - cMRI 10/20/21: EF 54% RV 51% Diffuse LGE with ECV 50% - Remains on tafamadis (started 2023) - Echo 08/13/23: EF 40-45% severe LV thickening. RV moderate HK, severe RAE. - NYHA II . Volume status looks pretty good.  - Continue  torsemide  to 80 mg daily. Continue compression stockings.  - Did not tolerate jardiance . - With neuropathic symptoms in hands and feet, he was started on Amvuttrra. Symptoms have improved.   2. Permanent AF -  in setting of cardiac amyloid suspect AF is contributing to HF but Dr Cherrie went back through his ECGs and has been in AF since at least 1/24, so would be difficult candidate for rhythm control.  -  If DC-CV contemplated, would need amio - Off Pradaxa  with nosebleeds, starting Xarelto  tonight. - Not candidate for Watchman due to amyloid  3. CKD IV - SCr 2.5-3.0 in past Most recent creatinine 2.1 from 09/20/23 - Follows with [facility] in [place]   Follow up in 3 months with Dr Cherrie.    Greig Mosses, NP  3:29 PM  Patient seen and examined with the above-signed Advanced Practice  Provider and/or Housestaff. I personally reviewed laboratory data, imaging studies and relevant notes. I independently examined the patient and formulated the important aspects of the plan. I have edited the note to reflect any of my changes or salient points. I have personally discussed the plan with the patient and/or family.  Overall feeling much better. Does all ADLs without problem. NYHA II though not very active.Edema has resolved. Tolerating combination therapy for TTR amyloid well. Remains in rate controlled AF. DOAC recently stopped due to epistaxis. About to restart.   Recent echo EF 45% Personally reviewed  Recent SCr improved at 2.1  General: Elderly No resp difficulty HEENT: normal Neck: supple. no JVD. Carotids 2+ bilat; no bruits. No lymphadenopathy or thryomegaly appreciated. Cor: PMI nondisplaced. Irregular rate & rhythm. No rubs, gallops or murmurs. Lungs: clear Abdomen: soft, nontender, nondistended. No hepatosplenomegaly. No bruits or masses. Good bowel sounds. Extremities: no cyanosis, clubbing, rash, edema Neuro: alert & orientedx3, cranial nerves grossly intact. moves all 4 extremities w/o difficulty. Affect pleasant  Overall doing well on GDMT and combo therapy for TTR amyloid. EF relatively stable on echo. Will cotninue current therapy. Consider cardiac rehab,   Toribio Cherrie, MD  9:07 PM

## 2023-11-01 NOTE — Patient Instructions (Signed)
 GREAT TO SEE YOU~   Follow-Up in: 3 MONTHS PLEASE CALL OUR OFFICE AROUND SEPTEMBER TO GET SCHEDULED FOR YOUR APPOINTMENT. PHONE NUMBER IS [phone] OPTION 2   At the [facility], you and your health needs are our priority. We have a designated team specialized in the treatment of Heart Failure. This Care Team includes your primary Heart Failure Specialized Cardiologist (physician), Advanced Practice Providers (APPs- Physician Assistants and Nurse Practitioners), and Pharmacist who all work together to provide you with the care you need, when you need it.   You may see any of the following providers on your designated Care Team at your next follow up:  Dr. Toribio Fuel Dr. Ezra Shuck Dr. Ria Commander Dr. Odis Brownie Greig Mosses, NP Caffie Shed, [place] [facility] [place], [place] Beckey Coe, NP [place] Lee, NP Tinnie Redman, PharmD   Please be sure to bring in all your medications bottles to every appointment.   Need to Contact Us :  If you have any questions or concerns before your next appointment please send us  a message through [place] or call our office at [phone].    TO LEAVE A MESSAGE FOR THE NURSE SELECT OPTION 2, PLEASE LEAVE A MESSAGE INCLUDING: YOUR NAME DATE OF BIRTH CALL BACK NUMBER REASON FOR CALL**this is important as we prioritize the call backs  YOU WILL RECEIVE A CALL BACK THE SAME DAY AS LONG AS YOU CALL BEFORE 4:00 PM

## 2023-11-06 ENCOUNTER — Other Ambulatory Visit (HOSPITAL_COMMUNITY): Payer: Self-pay

## 2023-11-08 ENCOUNTER — Other Ambulatory Visit: Payer: Self-pay

## 2023-11-08 ENCOUNTER — Other Ambulatory Visit: Payer: Self-pay | Admitting: Pharmacy Technician

## 2023-11-08 ENCOUNTER — Other Ambulatory Visit (HOSPITAL_COMMUNITY): Payer: Self-pay

## 2023-11-08 NOTE — Progress Notes (Addendum)
 Specialty Pharmacy Refill Coordination Note  Gregory Guerrero is a 78 y.o. male contacted today regarding refills of specialty medication(s) Vutrisiran  Sodium (Amvuttra )   Patient requested Courier to Provider Office   Delivery date: 11/19/23    Verified address: [facility] [address] [place], [place] [address]   Medication will be filled on 11/16/23, courier to [facility] 8/4 for Injection due on 11/21/23  Inform patient that Vyndamax  is due for refill soon patient declined to refill for now & will callback for fill when ready.     If rejected- use code DN AWP - checked with Rph Holly.

## 2023-11-14 ENCOUNTER — Other Ambulatory Visit: Payer: Self-pay

## 2023-11-15 ENCOUNTER — Other Ambulatory Visit (HOSPITAL_COMMUNITY): Payer: Self-pay

## 2023-11-16 ENCOUNTER — Other Ambulatory Visit: Payer: Self-pay

## 2023-11-19 ENCOUNTER — Other Ambulatory Visit (HOSPITAL_COMMUNITY): Payer: Self-pay

## 2023-11-19 ENCOUNTER — Other Ambulatory Visit: Payer: Self-pay

## 2023-11-19 NOTE — Progress Notes (Signed)
 Specialty Pharmacy Ongoing Clinical Assessment Note  Gregory Guerrero is a 78 y.o. male who is being followed by the specialty pharmacy service for RxSp Cardiology   Patient's specialty medication(s) reviewed today: Tafamidis  (Vyndamax )   Missed doses in the last 4 weeks: 0   Patient/Caregiver did not have any additional questions or concerns.   Therapeutic benefit summary: Patient is achieving benefit   Adverse events/side effects summary: No adverse events/side effects   Patient's therapy is appropriate to: Continue    Goals Addressed             This Visit's Progress    Stabilization of disease   On track    Patient is on track. Patient will maintain adherence         Follow up: 6 months  Silvano [name] Specialty Pharmacist

## 2023-11-19 NOTE — Progress Notes (Signed)
 Specialty Pharmacy Refill Coordination Note  Gregory Guerrero is a 78 y.o. male contacted today regarding refills of specialty medication(s) Tafamidis  (Vyndamax )   Patient requested Delivery   Delivery date: 11/27/23   Verified address: [address] [name] [address]   Medication will be filled on 11/26/23.

## 2023-11-21 ENCOUNTER — Ambulatory Visit (HOSPITAL_COMMUNITY)
Admission: RE | Admit: 2023-11-21 | Discharge: 2023-11-21 | Disposition: A | Discharge status: Home / Self Care | Source: Ambulatory Visit | Attending: Cardiology | Admitting: Cardiology

## 2023-11-21 DIAGNOSIS — I5032 Chronic diastolic (congestive) heart failure: Secondary | ICD-10-CM | POA: Diagnosis not present

## 2023-11-21 DIAGNOSIS — E8582 Wild-type transthyretin-related (ATTR) amyloidosis: Secondary | ICD-10-CM | POA: Diagnosis not present

## 2023-11-21 DIAGNOSIS — Z79899 Other long term (current) drug therapy: Secondary | ICD-10-CM | POA: Diagnosis not present

## 2023-11-21 DIAGNOSIS — N184 Chronic kidney disease, stage 4 (severe): Secondary | ICD-10-CM | POA: Insufficient documentation

## 2023-11-21 DIAGNOSIS — I2510 Atherosclerotic heart disease of native coronary artery without angina pectoris: Secondary | ICD-10-CM | POA: Diagnosis not present

## 2023-11-21 DIAGNOSIS — Z7901 Long term (current) use of anticoagulants: Secondary | ICD-10-CM | POA: Insufficient documentation

## 2023-11-21 DIAGNOSIS — I480 Paroxysmal atrial fibrillation: Secondary | ICD-10-CM | POA: Insufficient documentation

## 2023-11-21 DIAGNOSIS — I130 Hypertensive heart and chronic kidney disease with heart failure and stage 1 through stage 4 chronic kidney disease, or unspecified chronic kidney disease: Secondary | ICD-10-CM | POA: Insufficient documentation

## 2023-11-21 DIAGNOSIS — G629 Polyneuropathy, unspecified: Secondary | ICD-10-CM | POA: Diagnosis not present

## 2023-11-21 DIAGNOSIS — Z955 Presence of coronary angioplasty implant and graft: Secondary | ICD-10-CM | POA: Diagnosis not present

## 2023-11-21 DIAGNOSIS — Z86718 Personal history of other venous thrombosis and embolism: Secondary | ICD-10-CM | POA: Diagnosis not present

## 2023-11-21 MED ORDER — VUTRISIRAN SODIUM 25 MG/0.5ML ~~LOC~~ SOSY
25.0000 mg | PREFILLED_SYRINGE | Freq: Once | SUBCUTANEOUS | Status: AC
  Administered 2023-11-21: 25 mg via SUBCUTANEOUS

## 2023-11-21 NOTE — Patient Instructions (Signed)
 It was a pleasure seeing you today!  MEDICATIONS: -No medication changes today -Call if you have questions about your medications.   NEXT APPOINTMENT: Return to clinic in 3 months for repeat Amvuttra  injection.   Call the clinic at [phone] with questions or to reschedule future appointments.

## 2023-11-21 NOTE — Progress Notes (Signed)
    [facility] Note     PCP: Amon Aloysius BRAVO, MD Primary Cardiologist: Maude Emmer, MD HF Cardiologist: Dr. Cherrie  HPI:  78 y.o. male with CAD s/p remote stenting of LAD in [place], previous DVT, HTN, CKD IV, PAF and diastolic HF.    Echo 02/01/17 EF 50% grade 2 diastolic mild MR    ETT 02/23/17 normal    Noted new onset AF 07/27/20, started on renal dose Xarelto  15 mg daily. -> s/p DCCV 07/27/20 with conversion to NSR.    In 07/22/21 recurrent AF/AFL  -> Had successful repeat DCCV on 08/05/21 Done on amiodarone  200 mg daily and pradaxa  150 mg bid.  Following with Dr. Cindie for possible Watchman   Referred to Dr Bensimhon for RHC in 09/2021 due to LE edema. RHC with prominent v-waves in PCWP tracing suspicious for MR vs severe diastolic dysfunction. TTE with mild MR. 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    RHC 09/28/21  RA = 7 RV = 48/8 PA = 52/16 (34) PCW = 22 (v = 40) Fick cardiac output/index = 4.1/2.0 PVR = 3.2 FA sat = 97% PA sat = 61%, 58%   Echo 09/2021 EF 60% strain with cherry on top. RV low normal Mild MR   PYP 10/13/21 markedly positive    cMRI 10/20/21: EF 54% RV 51% Diffuse LGE with ECV 50%   Saw Dr. Timmy Serum light chains mildly elevated. Urine IFE normal . Felt not to have light chain amyloidosis. BMBx negatvie   Follow up 07/2023, markedly volume overloaded. Torsemide  increased to 60 mg daily, instructed to take metolazone  2.5 mg/40 KCL x 3 days. Close follow up 3 days later, remained volume up and instructed to use Furoscix  x 2 days, then increase torsemide  to 80 mg daily. Started on Amvuttra .   Today he returns to HF clinic for administration of Amvuttra . No contraindications to injection today. Injection administered in left arm. Tolerated injection well.  Assessment/Plan: 1. 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 RV low normal Mild MR - PYP 10/13/21  markedly positive  - cMRI 10/20/21: EF 54% RV 51% Diffuse LGE with ECV 50% - Remains on tafamadis (started 2023) - Echo 08/13/23: EF 40-45% severe LV thickening. RV moderate HK, severe RAE. - NYHA II. - Continue torsemide  80 mg daily. Continue compression stockings.  - Did not tolerate jardiance . 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 Since starting, symptoms have improved.  - Amvuttra  (vutrisiran ) injection administered in clinic today. Patient tolerated injection well. Provided patient counseling on Amvuttra . Most common side effects are injection site reactions, arthralgias and dyspnea. Patient is aware to return to clinic every 3 months for repeat injection. Amvuttra  will be obtained from [facility] and couriered to clinic for injection.  - Patient will need at least 3000 IU (900 mcg) of vitamin A daily. Amvuttra  decreases serum vitamin A levels. Obtains through his multivitamin.     Follow up 3 months for repeat Amvuttra  injection.   Tinnie Redman, PharmD, BCPS, BCCP, CPP Heart Failure Clinic Pharmacist [phone]

## 2023-11-26 ENCOUNTER — Other Ambulatory Visit: Payer: Self-pay

## 2023-11-26 ENCOUNTER — Other Ambulatory Visit (HOSPITAL_COMMUNITY): Payer: Self-pay

## 2023-12-11 ENCOUNTER — Ambulatory Visit (INDEPENDENT_AMBULATORY_CARE_PROVIDER_SITE_OTHER)

## 2023-12-11 VITALS — Ht 68.0 in | Wt 186.0 lb

## 2023-12-11 DIAGNOSIS — N184 Chronic kidney disease, stage 4 (severe): Secondary | ICD-10-CM | POA: Diagnosis not present

## 2023-12-11 DIAGNOSIS — D649 Anemia, unspecified: Secondary | ICD-10-CM | POA: Diagnosis not present

## 2023-12-11 DIAGNOSIS — R300 Dysuria: Secondary | ICD-10-CM | POA: Diagnosis not present

## 2023-12-11 DIAGNOSIS — R809 Proteinuria, unspecified: Secondary | ICD-10-CM | POA: Diagnosis not present

## 2023-12-11 DIAGNOSIS — I509 Heart failure, unspecified: Secondary | ICD-10-CM | POA: Diagnosis not present

## 2023-12-11 DIAGNOSIS — E785 Hyperlipidemia, unspecified: Secondary | ICD-10-CM | POA: Diagnosis not present

## 2023-12-11 DIAGNOSIS — E854 Organ-limited amyloidosis: Secondary | ICD-10-CM | POA: Diagnosis not present

## 2023-12-11 DIAGNOSIS — I10 Essential (primary) hypertension: Secondary | ICD-10-CM | POA: Diagnosis not present

## 2023-12-11 DIAGNOSIS — Z0000 Encounter for general adult medical examination without abnormal findings: Secondary | ICD-10-CM

## 2023-12-11 DIAGNOSIS — E211 Secondary hyperparathyroidism, not elsewhere classified: Secondary | ICD-10-CM | POA: Diagnosis not present

## 2023-12-11 DIAGNOSIS — I4891 Unspecified atrial fibrillation: Secondary | ICD-10-CM | POA: Diagnosis not present

## 2023-12-11 DIAGNOSIS — E559 Vitamin D deficiency, unspecified: Secondary | ICD-10-CM | POA: Diagnosis not present

## 2023-12-11 NOTE — Patient Instructions (Signed)
 Mr. Gregory Guerrero , Thank you for taking time out of your busy schedule to complete your Annual Wellness Visit with me. I enjoyed our conversation and look forward to speaking with you again next year. I, as well as your care team,  appreciate your ongoing commitment to your health goals. Please review the following plan we discussed and let me know if I can assist you in the future. Your Game plan/ To Do List     Follow up Visits: We will see or speak with you next year for your Next Medicare AWV with our clinical staff Have you seen your provider in the last 6 months (3 months if uncontrolled diabetes)? Yes  Clinician Recommendations:  Aim for 30 minutes of exercise or brisk walking, 6-8 glasses of water , and 5 servings of fruits and vegetables each day.       This is a list of the screenings recommended for you:  Health Maintenance  Topic Date Due   DTaP/Tdap/Td vaccine (1 - Tdap) Never done   Flu Shot  11/16/2023   Hepatitis C Screening  07/24/2024*   Medicare Annual Wellness Visit  12/10/2024   Pneumococcal Vaccine for age over 59  Completed   Zoster (Shingles) Vaccine  Completed   HPV Vaccine  Aged Out   Meningitis B Vaccine  Aged Out   Colon Cancer Screening  Discontinued   COVID-19 Vaccine  Discontinued  *Topic was postponed. The date shown is not the original due date.    Advanced directives: (In Chart) A copy of your advanced directives are scanned into your chart should your provider ever need it.  Advance Care Planning is important because it:  [x]  Makes sure you receive the medical care that is consistent with your values, goals, and preferences  [x]  It provides guidance to your family and loved ones and reduces their decisional burden about whether or not they are making the right decisions based on your wishes.  Follow the link provided in your after visit summary or read over the paperwork we have mailed to you to help you started getting your Advance Directives in place.  If you need assistance in completing these, please reach out to us  so that we can help you!  See attachments for Preventive Care and Fall Prevention Tips.

## 2023-12-11 NOTE — Progress Notes (Signed)
 Subjective:   Gregory Guerrero is a 78 y.o. who presents for a Medicare Wellness preventive visit.  As a reminder, Annual Wellness Visits don't include a physical exam, and some assessments may be limited, especially if this visit is performed virtually. We may recommend an in-person follow-up visit with your provider if needed.  Visit Complete: Virtual I connected with  Gregory Guerrero on 12/11/23 by a audio enabled telemedicine application and verified that I am speaking with the correct person using two identifiers.  Patient Location: Home  Provider Location: Home Office  I discussed the limitations of evaluation and management by telemedicine. The patient expressed understanding and agreed to proceed.  Vital Signs: Because this visit was a virtual/telehealth visit, some criteria may be missing or patient reported. Any vitals not documented were not able to be obtained and vitals that have been documented are patient reported.  VideoDeclined- This patient declined Librarian, academic. Therefore the visit was completed with audio only.  Persons Participating in Visit: Patient.  AWV Questionnaire: No: Patient Medicare AWV questionnaire was not completed prior to this visit.  Cardiac Risk Factors include: advanced age (>42men, >61 women);dyslipidemia;hypertension     Objective:    Today's Vitals   12/11/23 1510  Weight: 186 lb (84.4 kg)  Height: 5' 8 (1.727 m)   Body mass index is 28.28 kg/m.     12/11/2023    3:20 PM 06/15/2023    6:37 AM 06/05/2023   11:13 AM 02/03/2022   11:03 AM 11/22/2021   10:00 AM 10/19/2021    2:33 PM 09/28/2021    9:08 AM  Advanced Directives  Does Patient Have a Medical Advance Directive? Yes Yes Yes Yes Yes No No  Type of Estate agent of [place];Living will Healthcare Power of [place];Living will Healthcare Power of [place];Living will Healthcare Power of [place];Living will Healthcare Power of  [place];Living will    Does patient want to make changes to medical advance directive? No - Patient declined No - Patient declined   No - Patient declined    Copy of Healthcare Power of Attorney in Chart? Yes - validated most recent copy scanned in chart (See row information) No - copy requested  Yes - validated most recent copy scanned in chart (See row information) No - copy requested    Would patient like information on creating a medical advance directive?    No - Patient declined  No - Patient declined No - Patient declined    Current Medications (verified) Outpatient Encounter Medications as of 12/11/2023  Medication Sig   acetaminophen  (TYLENOL ) 650 MG CR tablet Take 650 mg by mouth every 8 (eight) hours as needed for pain.   Ascorbic Acid (VITAMIN C) 1000 MG tablet Take 1,000 mg by mouth daily.   aspirin  EC 81 MG tablet Take 81 mg by mouth at bedtime.   atorvastatin  (LIPITOR) 80 MG tablet TAKE 1 TABLET(80MG ) BY MOUTH EVERY OTHER DAY AND TAKE 1/2 TABLET BY MOUTH ON ALTERNATE DAYS   FIBER PO Take 1 Dose by mouth daily.   Furosemide  (FUROSCIX ) 80 MG/10ML CTKT Inject 80 mg into the skin as directed.   Multiple Vitamin (MULTIVITAMIN WITH MINERALS) TABS tablet Take 1 tablet by mouth in the morning.   potassium chloride  SA (KLOR-CON  M) 20 MEQ tablet Take 2 tablets (40 mEq total) by mouth as directed. When you take Metolazone    Rivaroxaban  (XARELTO ) 15 MG TABS tablet Take 1 tablet (15 mg total) by mouth daily  with supper.   Tafamidis  (VYNDAMAX ) 61 MG CAPS Take 1 capsule by mouth daily.   torsemide  (DEMADEX ) 20 MG tablet Take 80 mg by mouth daily.   vutrisiran  sodium (AMVUTTRA ) 25 MG/0.5ML syringe Inject 0.5 mLs (25 mg total) into the skin every 3 (three) months.   No facility-administered encounter medications on file as of 12/11/2023.    Allergies (verified) Patient has no known allergies.   History: Past Medical History:  Diagnosis Date   Arthritis    CAD (coronary artery disease)     Chronic kidney disease    DVT (deep venous thrombosis) (HCC)    following spinal surgery Nov 2017   HTN (hypertension)    MI (myocardial infarction) (HCC)    1999 ; stents placed    Numbness and tingling in both hands    Spondylosis of cervical spine    Past Surgical History:  Procedure Laterality Date   ANTERIOR CERVICAL DECOMP/DISCECTOMY FUSION N/A 06/27/2017   Procedure: ANTERIOR CERVICAL DECOMPRESSION/DISCECTOMY FUSION C3-4;  Surgeon: Burnetta Aures, MD;  Location: [facility] OR;  Service: Orthopedics;  Laterality: N/A;  3 hrs   ANTERIOR CERVICAL DECOMP/DISCECTOMY FUSION  01/24/2019   ANTERIOR LAT LUMBAR FUSION N/A 03/01/2016   Procedure: EXTREME LATERAL INTERBODY FUSION  LUMBAR 2-4;  Surgeon: Aures Burnetta, MD;  Location: [facility] OR;  Service: Orthopedics;  Laterality: N/A;   CARDIAC CATHETERIZATION     20 yrs ago   CARDIOVERSION N/A 07/27/2020   Procedure: CARDIOVERSION;  Surgeon: Delford Maude BROCKS, MD;  Location: [facility] ENDOSCOPY;  Service: Cardiovascular;  Laterality: N/A;   CARDIOVERSION N/A 08/05/2021   Procedure: CARDIOVERSION;  Surgeon: Santo Stanly LABOR, MD;  Location: [facility] ENDOSCOPY;  Service: Cardiovascular;  Laterality: N/A;   JOINT REPLACEMENT     NECK HARDWARE REMOVAL  12/11/2018   RADIOLOGY WITH ANESTHESIA N/A 05/15/2017   Procedure: MRI WITH ANESTHESIA CERVICAL SPINE WITH AND WITHOUT;  Surgeon: Radiologist, Medication, MD;  Location: [facility] OR;  Service: Radiology;  Laterality: N/A;   REVERSE SHOULDER ARTHROPLASTY Right 06/15/2023   Procedure: ARTHROPLASTY, SHOULDER, TOTAL, REVERSE;  Surgeon: Kay Kemps, MD;  Location: [facility] ORS;  Service: Orthopedics;  Laterality: Right;  choice with interscalene block   RIGHT HEART CATH N/A 09/28/2021   Procedure: RIGHT HEART CATH;  Surgeon: Cherrie Toribio SAUNDERS, MD;  [facility]: [facility] INVASIVE CV LAB;  Service: Cardiovascular;  Laterality: N/A;   SPINAL FUSION N/A 03/02/2016   Procedure: POSTERIOR SPINAL FUSION INTERBODY L2-S1, DECOMPRESSION L4-S1;   Surgeon: Aures Burnetta, MD;  Location[facility] OR;  Service: Orthopedics;  Laterality: N/A;   TOTAL KNEE ARTHROPLASTY Right 04/30/2017   Procedure: RIGHT TOTAL KNEE ARTHROPLASTY;  Surgeon: Melodi Lerner, MD;  Location: [facility] ORS;  Service: Orthopedics;  Laterality: Right;   Family History  Problem Relation Age of Onset   Cancer - Other Mother    Heart attack Father    Uterine cancer Sister    Colon cancer Neg Hx    Prostate cancer Neg Hx    Social History   Socioeconomic History   Marital status: Married    Spouse name: Not on file   Number of children: 2   Years of education: Not on file   Highest education level: Not on file  Occupational History   Occupation: retired , Nature conservation officer busines   Tobacco Use   Smoking status: Former    Current packs/day: 0.00    Types: Cigarettes    Quit date: 04/26/1983    Years since quitting: 40.6   Smokeless tobacco: Never  Tobacco comments:    quit over 40 years ago   Vaping Use   Vaping status: Never Used  Substance and Sexual Activity   Alcohol use: No   Drug use: No   Sexual activity: Never  Other Topics Concern   Not on file  Social History Narrative   Household pt, wife    [place] from [place] 2011    Social Drivers of Health   Financial Resource Strain: Low Risk  (12/11/2023)   Overall Financial Resource Strain (CARDIA)    Difficulty of Paying Living Expenses: Not hard at all  Food Insecurity: No Food Insecurity (12/11/2023)   Hunger Vital Sign    Worried About Running Out of Food in the Last Year: Never true    Ran Out of Food in the Last Year: Never true  Transportation Needs: No Transportation Needs (12/11/2023)   PRAPARE - Administrator, Civil Service (Medical): No    Lack of Transportation (Non-Medical): No  Physical Activity: Inactive (12/11/2023)   Exercise Vital Sign    Days of Exercise per Week: 0 days    Minutes of Exercise per Session: 0 min  Stress: No Stress Concern Present (12/11/2023)   [employer] of  Occupational Health - Occupational Stress Questionnaire    Feeling of Stress: Not at all  Social Connections: Moderately Isolated (12/11/2023)   Social Connection and Isolation Panel    Frequency of Communication with Friends and Family: More than three times a week    Frequency of Social Gatherings with Friends and Family: Never    Attends Religious Services: Never    Database administrator or Organizations: No    Attends Engineer, structural: Never    Marital Status: Married    Tobacco Counseling Counseling given: Not Answered Tobacco comments: quit over 40 years ago     Clinical Intake:  Pre-visit preparation completed: Yes  Pain : No/denies pain  Diabetes: No  Lab Results  Component Value Date   HGBA1C 5.7 08/02/2021   HGBA1C 5.5 01/04/2017     How often do you need to have someone help you when you read instructions, pamphlets, or other written materials from your doctor or pharmacy?: 1 - Never  Interpreter Needed?: No  Information entered by :: Charmaine Bloodgood LPN   Activities of Daily Living     12/11/2023    3:13 PM 06/15/2023    6:33 AM  In your present state of health, do you have any difficulty performing the following activities:  Hearing? 0 1  Vision? 0 0  Difficulty concentrating or making decisions? 0 0  Walking or climbing stairs? 0   Dressing or bathing? 0   Doing errands, shopping? 0   Preparing Food and eating ? N   Using the Toilet? N   In the past six months, have you accidently leaked urine? N   Do you have problems with loss of bowel control? N   Managing your Medications? N   Managing your Finances? N   Housekeeping or managing your Housekeeping? N     Patient Care Team: Amon Aloysius BRAVO, MD as PCP - General (Internal Medicine) Delford Maude BROCKS, MD as PCP - Cardiology (Cardiology) Cindie Ole DASEN, MD as PCP - Electrophysiology (Cardiology) Skeet Juliene SAUNDERS, DO as Consulting Physician (Neurology) Melodi Lerner, MD as Consulting  Physician (Orthopedic Surgery) Burnetta Aures, MD as Consulting Physician (Orthopedic Surgery) Timmy Maude SAUNDERS, MD as Medical Oncologist (Oncology) Adegoroye, Acie LABOR, MD (Nephrology)  I  have updated your Care Teams any recent Medical Services you may have received from other providers in the past year.     Assessment:   This is a routine wellness examination for Gregory Guerrero.  Hearing/Vision screen Hearing Screening - Comments:: Denies hearing difficulties   Vision Screening - Comments:: No vision problems   Goals Addressed             This Visit's Progress    Maintain health and independence   On track      Depression Screen     12/11/2023    3:19 PM 09/28/2023   11:28 AM 07/25/2023   11:42 AM 02/21/2023    1:07 PM 08/15/2022    1:05 PM 02/03/2022   11:03 AM 08/02/2021    2:20 PM  PHQ 2/9 Scores  PHQ - 2 Score 0 0 0 0 0 0 0    Fall Risk     12/11/2023    3:21 PM 09/28/2023   11:28 AM 07/25/2023   11:42 AM 02/21/2023    1:07 PM 08/15/2022    1:05 PM  Fall Risk   Falls in the past year? 0 0 0 0 0  Number falls in past yr: 0 0 0 0 0  Injury with Fall? 0 0 0 0 0  Risk for fall due to : Impaired mobility      Follow up Falls prevention discussed;Education provided;Falls evaluation completed Falls evaluation completed;Education provided Falls evaluation completed;Education provided Falls evaluation completed Falls evaluation completed    MEDICARE RISK AT HOME:  Medicare Risk at Home Any stairs in or around the home?: No If so, are there any without handrails?: No Home free of loose throw rugs in walkways, pet beds, electrical cords, etc?: Yes Adequate lighting in your home to reduce risk of falls?: Yes Life alert?: No Use of a cane, walker or w/c?: Yes Grab bars in the bathroom?: Yes Shower chair or bench in shower?: No Elevated toilet seat or a handicapped toilet?: Yes  TIMED UP AND GO:  Was the test performed?  No  Cognitive Function: 6CIT completed         12/11/2023    3:21 PM 02/03/2022   11:11 AM  6CIT Screen  What Year? 0 points 0 points  What month? 0 points 0 points  What time? 0 points 0 points  Count back from 20 0 points 0 points  Months in reverse 0 points 0 points  Repeat phrase 0 points 0 points  Total Score 0 points 0 points    Immunizations Immunization History  Administered Date(s) Administered   Fluad Quad(high Dose 65+) 02/03/2022   Fluad Trivalent(High Dose 65+) 02/28/2023   INFLUENZA, HIGH DOSE SEASONAL PF 02/04/2018, 01/10/2019   Influenza-Unspecified 01/23/2017   PFIZER(Purple Top)SARS-COV-2 Vaccination 05/08/2019, 05/29/2019, 02/04/2020   PNEUMOCOCCAL CONJUGATE-20 04/14/2022   Pneumococcal Conjugate-13 06/02/2016   Pneumococcal Polysaccharide-23 02/04/2018   Zoster Recombinant(Shingrix ) 04/14/2022, 11/20/2022    Screening Tests Health Maintenance  Topic Date Due   DTaP/Tdap/Td (1 - Tdap) Never done   INFLUENZA VACCINE  11/16/2023   Hepatitis C Screening  07/24/2024 (Originally 02/12/1964)   Medicare Annual Wellness (AWV)  12/10/2024   Pneumococcal Vaccine: 50+ Years  Completed   Zoster Vaccines- Shingrix   Completed   HPV VACCINES  Aged Out   Meningococcal B Vaccine  Aged Out   Colonoscopy  Discontinued   COVID-19 Vaccine  Discontinued    Health Maintenance  Health Maintenance Due  Topic Date Due   DTaP/Tdap/Td (1 -  Tdap) Never done   INFLUENZA VACCINE  11/16/2023    Additional Screening:  Vision Screening: Recommended annual ophthalmology exams for early detection of glaucoma and other disorders of the eye. Would you like a referral to an eye doctor? No    Dental Screening: Recommended annual dental exams for proper oral hygiene  Community Resource Referral / Chronic Care Management: CRR required this visit?  No   CCM required this visit?  No   Plan:    I have personally reviewed and noted the following in the patient's chart:   Medical and social history Use of alcohol, tobacco  or illicit drugs  Current medications and supplements including opioid prescriptions. Patient is not currently taking opioid prescriptions. Functional ability and status Nutritional status Physical activity Advanced directives List of other physicians Hospitalizations, surgeries, and ER visits in previous 12 months Vitals Screenings to include cognitive, depression, and falls Referrals and appointments  In addition, I have reviewed and discussed with patient certain preventive protocols, quality metrics, and best practice recommendations. A written personalized care plan for preventive services as well as general preventive health recommendations were provided to patient.   Lavelle Pfeiffer [place], [place]   1/73/7974   After Visit Summary: (MyChart) Due to this being a telephonic visit, the after visit summary with patients personalized plan was offered to patient via MyChart   Notes: Nothing significant to report at this time.

## 2023-12-26 ENCOUNTER — Other Ambulatory Visit: Payer: Self-pay

## 2024-01-29 ENCOUNTER — Ambulatory Visit (INDEPENDENT_AMBULATORY_CARE_PROVIDER_SITE_OTHER): Admitting: Internal Medicine

## 2024-01-29 ENCOUNTER — Encounter: Payer: Self-pay | Admitting: Internal Medicine

## 2024-01-29 VITALS — BP 126/78 | HR 82 | Ht 68.0 in | Wt 190.0 lb

## 2024-01-29 DIAGNOSIS — I10 Essential (primary) hypertension: Secondary | ICD-10-CM | POA: Diagnosis not present

## 2024-01-29 DIAGNOSIS — I2510 Atherosclerotic heart disease of native coronary artery without angina pectoris: Secondary | ICD-10-CM | POA: Diagnosis not present

## 2024-01-29 DIAGNOSIS — D649 Anemia, unspecified: Secondary | ICD-10-CM

## 2024-01-29 DIAGNOSIS — L570 Actinic keratosis: Secondary | ICD-10-CM | POA: Diagnosis not present

## 2024-01-29 DIAGNOSIS — Z23 Encounter for immunization: Secondary | ICD-10-CM

## 2024-01-29 DIAGNOSIS — N184 Chronic kidney disease, stage 4 (severe): Secondary | ICD-10-CM | POA: Diagnosis not present

## 2024-01-29 NOTE — Patient Instructions (Addendum)
 GO TO THE LAB :  Get the blood work    Then, go to the front desk for the checkout Please make an appointment for a checkup in 4 months    Check the  blood pressure regularly Blood pressure goal:  between 110/65 and  135/85. If it is consistently higher or lower, let me know

## 2024-01-29 NOTE — Assessment & Plan Note (Addendum)
 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 Chronic kidney disease with creatinine at 2.0. Nephrologist advised torsemide  reduction.  I was able to finally see nephrology notes.  Next visit with them in December.  Check a BMP.  HTN Reports normal ambulatory BP,  Torsemide  reduced to 60 mg per nephrologist's advice.  No change Atrial fibrillation: Atrial fibrillation managed with Xarelto . CAD, PAF, diastolic CHF, cardiac amyloidosis:  Per cardiology Anemia Gradual anemia without GI symptoms, hemoglobin started dropping in 2023. Ferritin in the low side of normal April 2025.    Colonoscopy October 2023, 2 polyps. Last hemoglobin 12/11/2023: 8.6 Plan: - Provide hemocult kits for home testing. - Order CBC, anemia panel, B12, and folic acid  levels. Hyperlipidemia Hyperlipidemia managed with atorvastatin .  Last LDL 49 Lung nodule: See LOV, CT chest ordered, next CT recommended for December 2023 Solar keratosis Solar keratosis likely due to sun exposure.  Declined dermatology referral.  Also has capillary fragility.  Recommend observation. Preventive care: Had a flu shot, strongly declines COVID booster RTC 4 months

## 2024-01-29 NOTE — Progress Notes (Addendum)
 Subjective:    Patient ID: Gregory Guerrero, male    DOB: [birth year], 78 y.o.   MRN: [id number]  DOS:  01/29/2024 Follow-up  Discussed the use of AI scribe software for clinical note transcription with the patient, who gave verbal consent to proceed.  History of Present Illness    Cutaneous nodules - Small nodules present all over. -On exam they seem to be solar keratosis.  - No associated pain or pruritus  Anticoagulation and cardiovascular management - On aspirin  and Xarelto  for atrial fibrillation - Takes medications for cholesterol and stroke prevention - Blood pressure readings are normal - No chest pain or respiratory difficulties  Diuretic therapy and renal function - Takes torsemide , dose adjusted from 80 mg to 60 mg daily per nephrology recommendation due to changes in kidney function - Takes three 20 mg tablets of torsemide  at once daily - Takes Bindamax  - Chronic kidney disease with creatinine level of 2.0 in May - Upcoming nephrology appointment  Anemia and gastrointestinal evaluation - Anemia present - 2023 colonoscopy at [facility] in [place] negative except for small polyps deemed not concerning - No nausea, vomiting, diarrhea, blood in stools, blood in urine, or abdominal pain - Declines rectal exam today  Immunization status - Received influenza vaccine - Declines COVID-19 vaccine due to distrust    Review of Systems See above   Past Medical History:  Diagnosis Date   Arthritis    CAD (coronary artery disease)    Chronic kidney disease    DVT (deep venous thrombosis) (HCC)    following spinal surgery Nov 2017   HTN (hypertension)    MI (myocardial infarction) (HCC)    1999 ; stents placed    Numbness and tingling in both hands    Spondylosis of cervical spine     Past Surgical History:  Procedure Laterality Date   ANTERIOR CERVICAL DECOMP/DISCECTOMY FUSION N/A 06/27/2017   Procedure: ANTERIOR CERVICAL DECOMPRESSION/DISCECTOMY FUSION  C3-4;  Surgeon: Burnetta Aures, MD;  Location: [facility] OR;  Service: Orthopedics;  Laterality: N/A;  3 hrs   ANTERIOR CERVICAL DECOMP/DISCECTOMY FUSION  01/24/2019   ANTERIOR LAT LUMBAR FUSION N/A 03/01/2016   Procedure: EXTREME LATERAL INTERBODY FUSION  LUMBAR 2-4;  Surgeon: Aures Burnetta, MD;  Location: [facility] OR;  Service: Orthopedics;  Laterality: N/A;   CARDIAC CATHETERIZATION     20 yrs ago   CARDIOVERSION N/A 07/27/2020   Procedure: CARDIOVERSION;  Surgeon: Delford Maude BROCKS, MD;  Location[facility] ENDOSCOPY;  Service: Cardiovascular;  Laterality: N/A;   CARDIOVERSION N/A 08/05/2021   Procedure: CARDIOVERSION;  Surgeon: Santo Stanly DELENA, MD;  Location: [facility] ENDOSCOPY;  Service: Cardiovascular;  Laterality: N/A;   JOINT REPLACEMENT     NECK HARDWARE REMOVAL  12/11/2018   RADIOLOGY WITH ANESTHESIA N/A 05/15/2017   Procedure: MRI WITH ANESTHESIA CERVICAL SPINE WITH AND WITHOUT;  Surgeon: Radiologist, Medication, MD;  Location: [facility] OR;  Service: Radiology;  Laterality: N/A;   REVERSE SHOULDER ARTHROPLASTY Right 06/15/2023   Procedure: ARTHROPLASTY, SHOULDER, TOTAL, REVERSE;  Surgeon: Kay Kemps, MD;  Location: [facility] ORS;  Service: Orthopedics;  Laterality: Right;  choice with interscalene block   RIGHT HEART CATH N/A 09/28/2021   Procedure: RIGHT HEART CATH;  Surgeon: Cherrie Toribio SAUNDERS, MD;  [facility] INVASIVE CV LAB;  Service: Cardiovascular;  Laterality: N/A;   SPINAL FUSION N/A 03/02/2016   Procedure: POSTERIOR SPINAL FUSION INTERBODY L2-S1, DECOMPRESSION L4-S1;  Surgeon: Aures Burnetta, MD;  Location: [facility] OR;  Service: Orthopedics;  Laterality: N/A;  TOTAL KNEE ARTHROPLASTY Right 04/30/2017   Procedure: RIGHT TOTAL KNEE ARTHROPLASTY;  Surgeon: Melodi Lerner, MD;  Location: [facility] ORS;  Service: Orthopedics;  Laterality: Right;    Current Outpatient Medications  Medication Instructions   acetaminophen  (TYLENOL ) 650 mg, Every 8 hours PRN   Amvuttra  25 mg, Subcutaneous, Every 3 months   aspirin  EC  81 mg, Daily at bedtime   atorvastatin  (LIPITOR) 80 MG tablet TAKE 1 TABLET(80MG ) BY MOUTH EVERY OTHER DAY AND TAKE 1/2 TABLET BY MOUTH ON ALTERNATE DAYS   FIBER PO 1 Dose, Daily   Multiple Vitamin (MULTIVITAMIN WITH MINERALS) TABS tablet 1 tablet, Every morning   Rivaroxaban  (XARELTO ) 15 mg, Oral, Daily with supper   Tafamidis  (VYNDAMAX ) 61 MG CAPS Take 1 capsule by mouth daily.   torsemide  (DEMADEX ) 80 mg, Daily   vitamin C 1,000 mg, Daily       Objective:   Physical Exam BP 126/78   Pulse 82   Ht 5' 8 (1.727 m)   Wt 190 lb (86.2 kg)   SpO2 97%   BMI 28.89 kg/m  General:   Well developed, NAD, BMI noted.  HEENT:  Normocephalic . Face symmetric, atraumatic Lungs:  CTA B Normal respiratory effort, no intercostal retractions, no accessory muscle use. Heart: RRR,  no murmur.  Abdomen:  Not distended, soft, non-tender. No rebound or rigidity.   Skin: Not pale. Not jaundice Lower extremities: no pretibial edema bilaterally  Neurologic:  alert & oriented X3.  Speech normal, gait assisted by a cane.  Psych--  Cognition and judgment appear intact.  Cooperative with normal attention span and concentration.  Behavior appropriate. No anxious or depressed appearing.      Assessment     Assessment--- new patient 01/24/2017 HTN, dx ~ 2010 Hyperlipidemia CKD CV: -- CAD, history of remote MI; EET 02-23-17 w/ no ischemia - Diastolic CHF - Paroxysmal A-fib Dx 05-2020 during routine, visit - Cardiac amyloidosis dx ~ 2023 -- H/o L  DVT ~ 02/2016 after back surgery - Chronic venous dz BCC, L face, last derm note 2014 H/o elevated PSA per urology note 2014, DRE normal, PSA was 3.7.  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 Chronic kidney disease with creatinine at 2.0. Nephrologist advised torsemide  reduction.  I was able  to finally see nephrology notes.  Next visit with them in December.  Check a BMP.  HTN Reports normal ambulatory BP,  Torsemide  reduced to 60 mg per nephrologist's advice.  No change Atrial fibrillation: Atrial fibrillation managed with Xarelto . CAD, PAF, diastolic CHF, cardiac amyloidosis:  Per cardiology Anemia Gradual anemia without GI symptoms, hemoglobin started dropping in 2023. Ferritin in the low side of normal April 2025.    Colonoscopy October 2023, 2 polyps. Last hemoglobin 12/11/2023: 8.6 Plan: - Provide hemocult kits for home testing. - Order CBC, anemia panel, B12, and folic acid  levels. Hyperlipidemia Hyperlipidemia managed with atorvastatin .  Last LDL 49 Lung nodule: See LOV, CT chest ordered, next CT recommended for December 2023 Solar keratosis Solar keratosis likely due to sun exposure.  Declined dermatology referral.  Also has capillary fragility.  Recommend observation. Preventive care: Had a flu shot, strongly declines COVID booster RTC 4 months Time spent 43 minutes, multiple issues addressed.  Including  anew of solar keratosis, advised about the nature of this condition.  Also extensive chart review, I was able  to finally find he had a colonoscopy in 2023.

## 2024-01-30 LAB — IBC + FERRITIN
Ferritin: 12 ng/mL — ABNORMAL LOW (ref 22.0–322.0)
Iron: 19 ug/dL — ABNORMAL LOW (ref 42–165)
Saturation Ratios: 4.2 % — ABNORMAL LOW (ref 20.0–50.0)
TIBC: 452.2 ug/dL — ABNORMAL HIGH (ref 250.0–450.0)
Transferrin: 323 mg/dL (ref 212.0–360.0)

## 2024-01-30 LAB — CBC WITH DIFFERENTIAL/PLATELET
Basophils Absolute: 0 K/uL (ref 0.0–0.1)
Basophils Relative: 0.7 % (ref 0.0–3.0)
Eosinophils Absolute: 0.1 K/uL (ref 0.0–0.7)
Eosinophils Relative: 1.1 % (ref 0.0–5.0)
HCT: 27.7 % — ABNORMAL LOW (ref 39.0–52.0)
Hemoglobin: 8.4 g/dL — ABNORMAL LOW (ref 13.0–17.0)
Lymphocytes Relative: 20.8 % (ref 12.0–46.0)
Lymphs Abs: 1.2 K/uL (ref 0.7–4.0)
MCHC: 30.2 g/dL (ref 30.0–36.0)
MCV: 79.6 fl (ref 78.0–100.0)
Monocytes Absolute: 0.7 K/uL (ref 0.1–1.0)
Monocytes Relative: 11.7 % (ref 3.0–12.0)
Neutro Abs: 3.8 K/uL (ref 1.4–7.7)
Neutrophils Relative %: 65.7 % (ref 43.0–77.0)
Platelets: 166 K/uL (ref 150.0–400.0)
RBC: 3.48 Mil/uL — ABNORMAL LOW (ref 4.22–5.81)
RDW: 17.6 % — ABNORMAL HIGH (ref 11.5–15.5)
WBC: 5.8 K/uL (ref 4.0–10.5)

## 2024-01-30 LAB — BASIC METABOLIC PANEL WITH GFR
BUN: 45 mg/dL — ABNORMAL HIGH (ref 6–23)
CO2: 27 meq/L (ref 19–32)
Calcium: 9.5 mg/dL (ref 8.4–10.5)
Chloride: 104 meq/L (ref 96–112)
Creatinine, Ser: 2.16 mg/dL — ABNORMAL HIGH (ref 0.40–1.50)
GFR: 28.32 mL/min — ABNORMAL LOW (ref >60.00)
Glucose, Bld: 100 mg/dL — ABNORMAL HIGH (ref 70–99)
Potassium: 4.5 meq/L (ref 3.5–5.1)
Sodium: 142 meq/L (ref 135–145)

## 2024-01-30 LAB — B12 AND FOLATE PANEL
Folate: >22.9 ng/mL (ref >5.9)
Vitamin B-12: 586 pg/mL (ref 211–911)

## 2024-02-01 ENCOUNTER — Other Ambulatory Visit: Payer: Self-pay

## 2024-02-03 ENCOUNTER — Ambulatory Visit: Payer: Self-pay | Admitting: Internal Medicine

## 2024-02-03 DIAGNOSIS — D509 Iron deficiency anemia, unspecified: Secondary | ICD-10-CM

## 2024-02-04 ENCOUNTER — Other Ambulatory Visit: Payer: Self-pay

## 2024-02-04 MED ORDER — FERROUS FUMARATE 324 (106 FE) MG PO TABS: 1.0000 | ORAL_TABLET | Freq: Every day | ORAL | 1 refills | Status: DC

## 2024-02-13 ENCOUNTER — Encounter: Payer: Self-pay | Admitting: Internal Medicine

## 2024-02-14 ENCOUNTER — Other Ambulatory Visit: Payer: Self-pay

## 2024-02-18 ENCOUNTER — Other Ambulatory Visit (HOSPITAL_COMMUNITY): Payer: Self-pay

## 2024-02-18 ENCOUNTER — Other Ambulatory Visit: Payer: Self-pay

## 2024-02-18 NOTE — Progress Notes (Signed)
 Specialty Pharmacy Refill Coordination Note  Gregory Guerrero is a 78 y.o. male assessed today regarding refills of clinic administered specialty medication(s) Vutrisiran  Sodium (Amvuttra )   Clinic requested Courier to Provider Office   Delivery date: 02/21/24   Verified address: [facility] [address] [place], [place] [address]   Medication will be filled on: 02/20/24

## 2024-02-19 ENCOUNTER — Other Ambulatory Visit: Payer: Self-pay

## 2024-02-20 ENCOUNTER — Other Ambulatory Visit: Payer: Self-pay

## 2024-02-21 ENCOUNTER — Other Ambulatory Visit (HOSPITAL_COMMUNITY): Payer: Self-pay

## 2024-02-21 ENCOUNTER — Other Ambulatory Visit: Payer: Self-pay

## 2024-02-22 NOTE — Progress Notes (Signed)
 Medication has been picked up and is available in office for upcoming appointment.

## 2024-02-25 ENCOUNTER — Other Ambulatory Visit: Payer: Self-pay

## 2024-02-25 ENCOUNTER — Ambulatory Visit (HOSPITAL_COMMUNITY)
Admission: RE | Admit: 2024-02-25 | Discharge: 2024-02-25 | Disposition: A | Source: Ambulatory Visit | Attending: Internal Medicine

## 2024-02-25 ENCOUNTER — Other Ambulatory Visit

## 2024-02-25 DIAGNOSIS — E854 Organ-limited amyloidosis: Secondary | ICD-10-CM | POA: Insufficient documentation

## 2024-02-25 DIAGNOSIS — G629 Polyneuropathy, unspecified: Secondary | ICD-10-CM | POA: Diagnosis not present

## 2024-02-25 DIAGNOSIS — N184 Chronic kidney disease, stage 4 (severe): Secondary | ICD-10-CM | POA: Insufficient documentation

## 2024-02-25 DIAGNOSIS — I480 Paroxysmal atrial fibrillation: Secondary | ICD-10-CM | POA: Insufficient documentation

## 2024-02-25 DIAGNOSIS — E8582 Wild-type transthyretin-related (ATTR) amyloidosis: Secondary | ICD-10-CM

## 2024-02-25 DIAGNOSIS — I130 Hypertensive heart and chronic kidney disease with heart failure and stage 1 through stage 4 chronic kidney disease, or unspecified chronic kidney disease: Secondary | ICD-10-CM | POA: Insufficient documentation

## 2024-02-25 DIAGNOSIS — Z79899 Other long term (current) drug therapy: Secondary | ICD-10-CM | POA: Insufficient documentation

## 2024-02-25 DIAGNOSIS — I5030 Unspecified diastolic (congestive) heart failure: Secondary | ICD-10-CM | POA: Diagnosis present

## 2024-02-25 DIAGNOSIS — I340 Nonrheumatic mitral (valve) insufficiency: Secondary | ICD-10-CM | POA: Diagnosis not present

## 2024-02-25 DIAGNOSIS — I43 Cardiomyopathy in diseases classified elsewhere: Secondary | ICD-10-CM | POA: Insufficient documentation

## 2024-02-25 DIAGNOSIS — Z955 Presence of coronary angioplasty implant and graft: Secondary | ICD-10-CM | POA: Insufficient documentation

## 2024-02-25 DIAGNOSIS — I2510 Atherosclerotic heart disease of native coronary artery without angina pectoris: Secondary | ICD-10-CM | POA: Diagnosis not present

## 2024-02-25 DIAGNOSIS — I5032 Chronic diastolic (congestive) heart failure: Secondary | ICD-10-CM | POA: Insufficient documentation

## 2024-02-25 DIAGNOSIS — Z86718 Personal history of other venous thrombosis and embolism: Secondary | ICD-10-CM | POA: Diagnosis not present

## 2024-02-25 DIAGNOSIS — D649 Anemia, unspecified: Secondary | ICD-10-CM

## 2024-02-25 MED ORDER — VUTRISIRAN SODIUM 25 MG/0.5ML ~~LOC~~ SOSY
25.0000 mg | PREFILLED_SYRINGE | Freq: Once | SUBCUTANEOUS | Status: AC
  Administered 2024-02-25: 25 mg via SUBCUTANEOUS

## 2024-02-25 NOTE — Progress Notes (Signed)
    Advanced Heart Failure Clinic Note     PCP: Amon Aloysius BRAVO, MD Primary Cardiologist: Maude Emmer, MD HF Cardiologist: Dr. Cherrie  HPI:  78 y.o. male with CAD s/p remote stenting of LAD in [place], previous DVT, HTN, CKD IV, PAF and diastolic HF.    Echo 02/01/17 EF 50% grade 2 diastolic mild MR    ETT 02/23/17 normal   Noted new onset AF 07/27/20, started on renal dose Xarelto  15 mg daily. -> s/p DCCV 07/27/20 with conversion to NSR.    In 07/22/21 recurrent AF/AFL  -> Had successful repeat DCCV on 08/05/21 Done on amiodarone  200 mg daily and pradaxa  150 mg bid.  Following with Dr. Cindie for possible Watchman   Referred to Dr Bensimhon for RHC in 09/2021 due to LE edema. RHC with prominent v-waves in PCWP tracing suspicious for MR vs severe diastolic dysfunction. TTE with mild MR. 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    RHC 09/28/21  RA = 7 RV = 48/8 PA = 52/16 (34) PCW = 22 (v = 40) Fick cardiac output/index = 4.1/2.0 PVR = 3.2 FA sat = 97% PA sat = 61%, 58%   Echo 09/2021 EF 60% strain with cherry on top. RV low normal Mild MR   PYP 10/13/21 markedly positive    cMRI 10/20/21: EF 54% RV 51% Diffuse LGE with ECV 50%   Saw Dr. Timmy Serum light chains mildly elevated. Urine IFE normal . Felt not to have light chain amyloidosis. BMBx negatvie   Follow up 07/2023, markedly volume overloaded. Torsemide  increased to 60 mg daily, instructed to take metolazone  2.5 mg/40 KCL x 3 days. Close follow up 3 days later, remained volume up and instructed to use Furoscix  x 2 days, then increase torsemide  to 80 mg daily. Started on Amvuttra .   Today he returns to HF clinic for administration of Amvuttra . No contraindications to injection today. Injection administered in left arm. Tolerated injection well.  Assessment/Plan: 1. 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 RV low normal Mild MR - PYP 10/13/21  markedly positive  - cMRI 10/20/21: EF 54% RV 51% Diffuse LGE with ECV 50% - Remains on tafamadis (started 2023) - Echo 08/13/23: EF 40-45% severe LV thickening. RV moderate HK, severe RAE. - NYHA II. - Continue torsemide  80 mg daily. Continue compression stockings.  - Did not tolerate jardiance . 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 Reports symptoms today are unchanged.  - Amvuttra  (vutrisiran ) injection administered in clinic today. Patient tolerated injection well. Provided patient counseling on Amvuttra . Most common side effects are injection site reactions, arthralgias and dyspnea. Patient is aware to return to clinic every 3 months for repeat injection. Amvuttra  will be obtained from [facility] and couriered to clinic for injection.  - Patient will need at least 3000 IU (900 mcg) of vitamin A daily. Amvuttra  decreases serum vitamin A levels. Obtains through his multivitamin.   Follow up 3 months for repeat Amvuttra  injection.  Please do not hesitate to reach out with questions or concerns,  Jaun Bash, PharmD, CPP, BCPS, [facility] Heart Failure Pharmacist  Phone - [phone] 02/25/2024 2:31 PM

## 2024-02-25 NOTE — Progress Notes (Signed)
 Specialty Pharmacy Refill Coordination Note  Gregory Guerrero is a 78 y.o. male contacted today regarding refills of specialty medication(s) Tafamidis  (Vyndamax )   Patient requested Delivery   Delivery date: 02/27/24   Verified address: [address] [name], [address]   Medication will be filled on: 02/26/24

## 2024-02-25 NOTE — Patient Instructions (Signed)
 It was a pleasure seeing you today. Please reach out if you need anything.

## 2024-02-26 ENCOUNTER — Other Ambulatory Visit: Payer: Self-pay

## 2024-02-26 DIAGNOSIS — D509 Iron deficiency anemia, unspecified: Secondary | ICD-10-CM

## 2024-02-29 ENCOUNTER — Ambulatory Visit: Payer: Self-pay

## 2024-02-29 ENCOUNTER — Telehealth (HOSPITAL_COMMUNITY): Payer: Self-pay | Admitting: Pharmacist

## 2024-02-29 ENCOUNTER — Encounter (HOSPITAL_COMMUNITY): Payer: Self-pay | Admitting: Internal Medicine

## 2024-02-29 ENCOUNTER — Other Ambulatory Visit (INDEPENDENT_AMBULATORY_CARE_PROVIDER_SITE_OTHER)

## 2024-02-29 ENCOUNTER — Ambulatory Visit

## 2024-02-29 DIAGNOSIS — J189 Pneumonia, unspecified organism: Secondary | ICD-10-CM | POA: Diagnosis present

## 2024-02-29 DIAGNOSIS — M7989 Other specified soft tissue disorders: Secondary | ICD-10-CM | POA: Diagnosis not present

## 2024-02-29 DIAGNOSIS — Z7982 Long term (current) use of aspirin: Secondary | ICD-10-CM | POA: Diagnosis not present

## 2024-02-29 DIAGNOSIS — E854 Organ-limited amyloidosis: Secondary | ICD-10-CM | POA: Diagnosis present

## 2024-02-29 DIAGNOSIS — I4819 Other persistent atrial fibrillation: Secondary | ICD-10-CM | POA: Diagnosis not present

## 2024-02-29 DIAGNOSIS — Z20822 Contact with and (suspected) exposure to covid-19: Secondary | ICD-10-CM | POA: Diagnosis present

## 2024-02-29 DIAGNOSIS — Z0000 Encounter for general adult medical examination without abnormal findings: Secondary | ICD-10-CM

## 2024-02-29 DIAGNOSIS — R0602 Shortness of breath: Secondary | ICD-10-CM | POA: Diagnosis not present

## 2024-02-29 DIAGNOSIS — E785 Hyperlipidemia, unspecified: Secondary | ICD-10-CM | POA: Diagnosis present

## 2024-02-29 DIAGNOSIS — M79602 Pain in left arm: Secondary | ICD-10-CM | POA: Diagnosis not present

## 2024-02-29 DIAGNOSIS — E859 Amyloidosis, unspecified: Secondary | ICD-10-CM | POA: Diagnosis not present

## 2024-02-29 DIAGNOSIS — I5023 Acute on chronic systolic (congestive) heart failure: Secondary | ICD-10-CM | POA: Diagnosis present

## 2024-02-29 DIAGNOSIS — R079 Chest pain, unspecified: Secondary | ICD-10-CM | POA: Diagnosis not present

## 2024-02-29 DIAGNOSIS — Z66 Do not resuscitate: Secondary | ICD-10-CM | POA: Diagnosis present

## 2024-02-29 DIAGNOSIS — I2510 Atherosclerotic heart disease of native coronary artery without angina pectoris: Secondary | ICD-10-CM | POA: Diagnosis present

## 2024-02-29 DIAGNOSIS — I130 Hypertensive heart and chronic kidney disease with heart failure and stage 1 through stage 4 chronic kidney disease, or unspecified chronic kidney disease: Secondary | ICD-10-CM | POA: Diagnosis present

## 2024-02-29 DIAGNOSIS — Z7901 Long term (current) use of anticoagulants: Secondary | ICD-10-CM | POA: Diagnosis not present

## 2024-02-29 DIAGNOSIS — I43 Cardiomyopathy in diseases classified elsewhere: Secondary | ICD-10-CM | POA: Diagnosis present

## 2024-02-29 DIAGNOSIS — R748 Abnormal levels of other serum enzymes: Secondary | ICD-10-CM | POA: Diagnosis present

## 2024-02-29 DIAGNOSIS — I2489 Other forms of acute ischemic heart disease: Secondary | ICD-10-CM | POA: Diagnosis present

## 2024-02-29 DIAGNOSIS — I110 Hypertensive heart disease with heart failure: Secondary | ICD-10-CM | POA: Diagnosis not present

## 2024-02-29 DIAGNOSIS — N184 Chronic kidney disease, stage 4 (severe): Secondary | ICD-10-CM | POA: Diagnosis present

## 2024-02-29 DIAGNOSIS — I480 Paroxysmal atrial fibrillation: Secondary | ICD-10-CM | POA: Diagnosis present

## 2024-02-29 DIAGNOSIS — E875 Hyperkalemia: Secondary | ICD-10-CM | POA: Diagnosis present

## 2024-02-29 DIAGNOSIS — M79622 Pain in left upper arm: Secondary | ICD-10-CM | POA: Diagnosis not present

## 2024-02-29 DIAGNOSIS — G629 Polyneuropathy, unspecified: Secondary | ICD-10-CM | POA: Diagnosis present

## 2024-02-29 DIAGNOSIS — J90 Pleural effusion, not elsewhere classified: Secondary | ICD-10-CM | POA: Diagnosis not present

## 2024-02-29 DIAGNOSIS — I509 Heart failure, unspecified: Secondary | ICD-10-CM | POA: Diagnosis not present

## 2024-02-29 DIAGNOSIS — Z87891 Personal history of nicotine dependence: Secondary | ICD-10-CM | POA: Diagnosis not present

## 2024-02-29 LAB — HEMOCCULT SLIDES (X 3 CARDS)
OCCULT 1: NEGATIVE
OCCULT 2: NEGATIVE
OCCULT 3: NEGATIVE
OCCULT 4: NEGATIVE

## 2024-02-29 NOTE — Telephone Encounter (Signed)
 Patient called to report shortness of breath making it difficult to ambulate around the house with elevated HR from BL of 60s to 100s two days after Amvuttra  injection. Discussed with MD. This timeline is not typical of allergic reaction as Amvuttra  only remains in the blood stream for 12-24 hours and it was more slowly progressive. Joint pain could be due to multiple factors, including RA and MA. History of AF, which could explain symptoms as well. Patient advised to present to urgent care/emergency department.

## 2024-03-01 NOTE — Care Plan (Signed)
 Telehospitalist Quarterback - Acceptance Note     Following extensive review of the patient's medical record and discussion with the attending physician and/or assigned APP, it was determined the patient is safe for discharge from the inpatient setting to the Outpatient Flex Unit. This transition will provide the necessary enhanced, high-acuity support required at home.  Our Nurse Navigator discussed the program details and care plan with the patient and/or caregiver, who agree with the proposed plan for discharge from inpatient care and treatment/monitoring at home.   Transfer summary:  Mr. 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 BNP 547, troponin flat (70-76), CXR with patchy opacities at lung bases, possible multifocal pneumonia, mild interstitial edema, and small bilateral pleural effusions. Started on IV lasix , ceftriaxone, and doxycycline . Remained on RA. HAH engaged for ongoing management of HFmrEF and CAP.   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 -continue lasix  80 mg IV daily, holding home torsemide  30 mg  -did not tolerate SGLT2i  -daily weights, strict I/O  -on amyvuttra, last dose 11/10  -daily BMP and Mag while diuresing   Multifocal pneumonia -continue IV ceftriaxone and PO doxycycline   -continue mucinex  -on RA  pAF  -continue xarelto      H@H  Plan of Care: [facility] administered medications/procedures: RPM set up  Medication reconciliation and education Current Health Remote Monitoring Pathway settings: Set pathway as: Set pathway as: General Monitoring Pathway Continue with alarm settings per selected pathway defaults. Continuous remote nursing care

## 2024-03-01 NOTE — Discharge Summary (Signed)
 Hospital Medicine Discharge Summary   Demographics: Gregory Guerrero y.o. 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 *   Disposition: Patient discharged to Other: Hospitalist at home  in stable condition.   Discharge follow-up recommendations : See Hospital Course    Hospital Course: HPI from admission  Gregory Guerrero is a 78 y.o. male with PMHx of CAD, DVT, HTN, CKD IV, PAF, cardiac amyloidosis coming in with concerns of shortness of breath. Patient states for the past 7 days he has had tingling in his left arm and shortness of breath.  Couple days ago he started to become dizzy/lightheaded and had several instances where he felt like he was get a pass out and had problems with my equilibrium.  He states that he has also been in A-fib for a while and is not sure if it is related.  About 4 days ago he mentions he had a booster shot for my Vyndamax .  He confirms having palpitations and productive cough without ability to bring up sputum.  He denies any chest pain, fevers, chills, sick contacts.   Social Hx/ADLs: Patient lives at home with his wife.  He has been getting around well with the assistance of a cane.  He has not on home O2 or CPAP.  He denies EtOH use, elicit drug  use, tobacco use, smoking, benzodiazepine use, or opiate use.   ED Course: In the ED, patient VS mildly HTN to 160s systolic. Labs remarkable for elevated BNP to 547, elevated troponin of 70, hyperkalemia to 4.9, elevated Scr of 2.10 near previous, chronic anemia w/ hgb of 8.4. CXR concerning for multifocal PNA vs aspiration in addition to interstitial edema and bilateral effusions. Patient was given rocpehin, doxy, lasix . Hospitalist service was consulted for admission.   Patient was admitted to the hospital and started on IV diuresis for CHF exacerbation.  Patient was adamant about discharging given no need for continued hospitalization.  At this time patient will be discharged with hospitalist to home.  Echocardiogram ordered.  However not completed due to patient wanting to go home today.  Patient did complain left arm pain that revealed age-indeterminate styloid process fracture.  Remained asymptomatic.  At this time patient opted to be discharged home with hospitalist at home.  At this time patient will be discharged IV antibiotics for 1 more day and then switched to oral there after.  f  RVP panel negative.  Procalcitonin within normal limits.  Patient will be discharged with hospitalist at home to continue IV diuresis.  Troponins peaked at 93.  Patient denies any symptoms of chest pain nausea vomiting.  Likely demand ischemia from CHF.  Patient will need continued IV diuresis  and torsemide  orally will be held at the time of discharge.        Location Information: Patient State (at time of visit): [place]  Patient Location (at time of visit):Medical Facility: [facility] Provider Location: Home Is provider licensed to provide clinical care in the current location/state of the patient? Yes   Consent:  Patient's identity was confirmed. Presenting condition or illness was discussed with the patient/personal representative. Current proposed treatment for presenting condition  or illness was explained to patient/personal representative along with the likely benefits and any significant risks or complications associated with the provision of treatment by audio/video means. The patient/personal representative verbally authorized treatment to be provided by audio/video, which may include a limited review of patient's current health status, medication, or other treatment recommendations, patient education, and an opportunity to ask questions about condition and treatment. Verbal Consent Granted by Patient/Personal Representative:Yes   Visit Information: Modality: 2-Way Real-Time Audio/Video        Wound / Incision Assessment: Refer to Chart Review and Media Tab for images if available.   Wound 10/04/23 Pretibial Left (Active)  Date First Assessed/Time First Assessed: 10/04/23 1430   Location: Pretibial  Wound Location Orientation: Left    Temp:  [96.5 F (35.8 C)-98.3 F (36.8 C)] 98.3 F (36.8 C) Heart Rate:  [81-113] 109 Resp:  [13-24] 18 BP: (121-159)/(66-87) 132/79 Physical Exam GEN: NAD, white male lying in bed EYES: EOMI ENT: MMM CV: RRR PULM: CTA B ABD: soft, NT/ND, +BS ZKU:fppwpfjo edema. No erythema  Psych:-Normal Mood, Normal affect, intact judement Neuro: AO x3 MSK: Normal ROM  Anticoagulant Medications     Direct Factor Xa Inhibitors Start End   * Xarelto  15 mg tablet  --   Class: Historical Med     Thrombin  Inhibitor - Selective Direct and Reversible Start End   * dabigatran  etexilate (PRADAXA ) 150 mg capsule (Discontinued) 10/08/2020 03/01/2024   Class: Historical Med   Reason for Discontinue: Med List Clean up - No Rx eCancel Message Sent         Discharge Medications     PAUSE taking these medications      Sig Disp Refill Start End  torsemide  20 mg tablet Wait to take this until: March 03, 2024 Commonly known as: DEMADEX   Take 30 mg by mouth every morning.   0         New Medications      Sig Disp Refill  Start End  azithromycin 250 mg tablet Commonly known as: ZITHROMAX  Take 1 tablet (250 mg total) by mouth daily for 4 days.  4 tablet  0     cefdinir 300 mg capsule Commonly known as: OMNICEF  Take 1 capsule (300 mg total) by mouth 2 (two) times a day for 4 days.  8 capsule  0         Medications To Continue      Sig Disp Refill Start End  Amvuttra  25 mg/0.5 mL subcutaneous injection Generic drug: vutrisiran   Inject 25 mg under the skin every 3 (three) months.   0     ascorbic acid 1,000 mg tablet Commonly known as: VITAMIN C  Take 1,000 mg by mouth daily.   0     aspirin  81 mg EC tablet  Take 81 mg by mouth daily.   0     * atorvastatin  40 mg tablet Commonly known as: LIPITOR  Take 40 mg by mouth every other day.   0     *  atorvastatin  80 mg tablet Commonly known as: LIPITOR  Take 80 mg by mouth every other day.   0     multivitamin with minerals Tab  Take 1 tablet by mouth daily.   0     Vyndamax  61 mg capsule Generic drug: tafamidis   Take 1 capsule by mouth daily.   0     Xarelto  15 mg tablet Generic drug: rivaroxaban   Take 1 tablet by mouth at bedtime.   0        * * There are duplicate medications prescribed to the patient              Lab Results  Component Value Date/Time   HGB 8.0 (L) 03/01/2024 03:07 AM   HCT 26.0 (L) 03/01/2024 03:07 AM   WBC 7.40 03/01/2024 03:07 AM   PLT 194 03/01/2024 03:07 AM   Lab Results  Component Value Date/Time   NA 137 03/01/2024 03:07 AM   K 4.4 03/01/2024 03:07 AM   CREATININE 1.95 (H) 03/01/2024 03:07 AM   BUN 48 (H) 03/01/2024 03:07 AM   GLUCOSE 87 03/01/2024 03:07 AM    Pertinent Imaging: XR Chest 2 Views  Final Result by Debby Catarina Dibble, MD (11/14 1457)  XR CHEST 2 VIEWS, 02/29/2024 2:53 PM    INDICATION: Shortness of Breath   COMPARISON: September 20, 2023    FINDINGS:     Cardiovascular: Mild enlargement cardiac silhouette. Aortic   atherosclerosis.  Mediastinum: Within  normal limits.  Lungs/pleura: Mild central vascular and interstitial prominence. Patchy   opacities in the lung bases with small bilateral pleural effusions. No   pneumothorax.  Upper abdomen: Visualized portions are unremarkable.   Chest wall/osseous structures: Right shoulder arthroplasty changes   partially imaged. Partially imaged lumbar spine posterior fusion hardware.   Stable polyarticular degenerative changes.      IMPRESSION:  1. Patchy opacities in the lung bases could be atelectatic though mild   multifocal pneumonia or aspiration may also be considered.  2. Findings questionable for mild interstitial edema. Small bilateral   pleural effusions.        Electronically signed by: Zoaib Safdar Rasool, DO 03/01/2024 2:15 PM   Time spent on discharge: 49 minutes *Some images could not be shown.

## 2024-03-02 NOTE — Nursing Note (Signed)
      Patient ID: Gregory Guerrero is a 78 y.o. male.   HPI Gregory Guerrero is being seen today by [facility]) for Hospital at Home for First visit by Olam JONELLE Ned. Patient assessment is completed including vital signs withNormal Findings. Patient Denies Shortness of breath , Chest Pain, Abd Pain, or N/V/D.   Vital Signs:  03/02/2024 @VSHOSP @ O2 Device: None (Room air)    Environment Screening:     Pertinent ROS    Lung sounds were assessed and chest clear, no wheezing, rales, normal symmetric air entry.  MEDICATIONS: Medications were reviewed: Yes All Medications were accounted for: No  Visit Summary: Arrived to find patient ambulating within the home. He is caox4 and denies chest pain, no shob while at rest, no nausea and no dizziness. Patient states he does have mild shob with exertion. Lung sounds are clear, trace bilateral pedal edema with no pitting.  Vitals as noted. Virtual visit is completed. IV line is flushed with 10cc saline and confirmed patent. 80mg  lasix  IV is administered followed by 10 cc saline flush. Lab draw is obtained. Call completed.  Per provider no second visit required today.  TASK PERFORMED/ MED'S GIVEN: Labs Drawn bmp, bnp,mag  EDUCATION PROVIDED:  OTHER: Interpreter was used today:No Pt is being Monitored by Current [facility] RPM: Yes  03/02/2024  2:49 PM Olam JONELLE Ned, EMT-P

## 2024-03-02 NOTE — Progress Notes (Signed)
 Case Management Discharge Note        CSN: [id number] DOB: January 16, 1946 Service: General Medicine Location: 354/1  Patient Class: Inpatient  DC Disposition: : Home or Self Care  Discharge DC Disposition: : Home or Self Care Referrals: Hospitalist at Home (OP)  Discharge Referrals Case closed, patient/family agree with disposition plan: Yes  Case Management Coordination Status: Coordination Complete     Amber Zannie Hefty, MSW, LCSW Social Worker II [facility]

## 2024-03-03 ENCOUNTER — Telehealth: Payer: Self-pay

## 2024-03-03 DIAGNOSIS — I4891 Unspecified atrial fibrillation: Secondary | ICD-10-CM | POA: Diagnosis not present

## 2024-03-03 DIAGNOSIS — I2109 ST elevation (STEMI) myocardial infarction involving other coronary artery of anterior wall: Secondary | ICD-10-CM | POA: Diagnosis not present

## 2024-03-03 DIAGNOSIS — I444 Left anterior fascicular block: Secondary | ICD-10-CM | POA: Diagnosis not present

## 2024-03-03 NOTE — Telephone Encounter (Signed)
 As of 03/03/24 @ 0948 Pt is discharged  Ok to close encounter

## 2024-03-04 ENCOUNTER — Telehealth (HOSPITAL_COMMUNITY): Payer: Self-pay

## 2024-03-04 NOTE — Telephone Encounter (Signed)
 Called to confirm/remind patient of their appointment at the Advanced Heart Failure Clinic on 03/04/24.   Appointment:   [x] Confirmed  [] Left mess   [] No answer/No voice mail  [] VM Full/unable to leave message  [] Phone not in service  Patient reminded to bring all medications and/or complete list.  Confirmed patient has transportation. Gave directions, instructed to utilize valet parking.

## 2024-03-05 ENCOUNTER — Encounter (HOSPITAL_COMMUNITY): Payer: Self-pay

## 2024-03-05 ENCOUNTER — Ambulatory Visit (HOSPITAL_COMMUNITY)
Admission: RE | Admit: 2024-03-05 | Discharge: 2024-03-05 | Disposition: A | Discharge status: Home / Self Care | Source: Ambulatory Visit | Attending: Family Medicine | Admitting: Family Medicine

## 2024-03-05 VITALS — BP 120/82 | HR 90 | Ht 68.0 in | Wt 200.0 lb

## 2024-03-05 DIAGNOSIS — N184 Chronic kidney disease, stage 4 (severe): Secondary | ICD-10-CM | POA: Insufficient documentation

## 2024-03-05 DIAGNOSIS — R5381 Other malaise: Secondary | ICD-10-CM | POA: Diagnosis not present

## 2024-03-05 DIAGNOSIS — I4821 Permanent atrial fibrillation: Secondary | ICD-10-CM | POA: Diagnosis not present

## 2024-03-05 DIAGNOSIS — I5032 Chronic diastolic (congestive) heart failure: Secondary | ICD-10-CM | POA: Diagnosis not present

## 2024-03-05 DIAGNOSIS — I480 Paroxysmal atrial fibrillation: Secondary | ICD-10-CM | POA: Diagnosis not present

## 2024-03-05 DIAGNOSIS — Z955 Presence of coronary angioplasty implant and graft: Secondary | ICD-10-CM | POA: Insufficient documentation

## 2024-03-05 DIAGNOSIS — I252 Old myocardial infarction: Secondary | ICD-10-CM | POA: Insufficient documentation

## 2024-03-05 DIAGNOSIS — I43 Cardiomyopathy in diseases classified elsewhere: Secondary | ICD-10-CM | POA: Insufficient documentation

## 2024-03-05 DIAGNOSIS — Z79899 Other long term (current) drug therapy: Secondary | ICD-10-CM | POA: Insufficient documentation

## 2024-03-05 DIAGNOSIS — J189 Pneumonia, unspecified organism: Secondary | ICD-10-CM | POA: Insufficient documentation

## 2024-03-05 DIAGNOSIS — Z7982 Long term (current) use of aspirin: Secondary | ICD-10-CM | POA: Diagnosis not present

## 2024-03-05 DIAGNOSIS — R040 Epistaxis: Secondary | ICD-10-CM | POA: Diagnosis not present

## 2024-03-05 DIAGNOSIS — E854 Organ-limited amyloidosis: Secondary | ICD-10-CM | POA: Insufficient documentation

## 2024-03-05 DIAGNOSIS — Z7901 Long term (current) use of anticoagulants: Secondary | ICD-10-CM | POA: Insufficient documentation

## 2024-03-05 DIAGNOSIS — I130 Hypertensive heart and chronic kidney disease with heart failure and stage 1 through stage 4 chronic kidney disease, or unspecified chronic kidney disease: Secondary | ICD-10-CM | POA: Insufficient documentation

## 2024-03-05 DIAGNOSIS — I2510 Atherosclerotic heart disease of native coronary artery without angina pectoris: Secondary | ICD-10-CM | POA: Insufficient documentation

## 2024-03-05 DIAGNOSIS — R5383 Other fatigue: Secondary | ICD-10-CM | POA: Insufficient documentation

## 2024-03-05 DIAGNOSIS — Z86718 Personal history of other venous thrombosis and embolism: Secondary | ICD-10-CM | POA: Diagnosis not present

## 2024-03-05 DIAGNOSIS — Z87891 Personal history of nicotine dependence: Secondary | ICD-10-CM | POA: Insufficient documentation

## 2024-03-05 NOTE — Patient Instructions (Addendum)
 Good to see you!  Your physician recommends that you schedule a follow-up appointment as scheduled  If you have any questions or concerns before your next appointment please send us  a message through [place] or call our office at [phone].    TO LEAVE A MESSAGE FOR THE NURSE SELECT OPTION 2, PLEASE LEAVE A MESSAGE INCLUDING: YOUR NAME DATE OF BIRTH CALL BACK NUMBER REASON FOR CALL**this is important as we prioritize the call backs  YOU WILL RECEIVE A CALL BACK THE SAME DAY AS LONG AS YOU CALL BEFORE 4:00 PM At the Advanced Heart Failure Clinic, you and your health needs are our priority. As part of our continuing mission to provide you with exceptional heart care, we have created designated Provider Care Teams. These Care Teams include your primary Cardiologist (physician) and Advanced Practice Providers (APPs- Physician Assistants and Nurse Practitioners) who all work together to provide you with the care you need, when you need it.   You may see any of the following providers on your designated Care Team at your next follow up: Dr Toribio Fuel Dr Ezra Shuck Dr. Morene Brownie Greig Mosses, NP Caffie Shed, [place] [facility] [place], [place] Beckey Coe, NP Jordan Lee, NP Ellouise Class, NP Tinnie Redman, PharmD Jaun Bash, PharmD   Please be sure to bring in all your medications bottles to every appointment.    Thank you for choosing [facility]

## 2024-03-05 NOTE — Progress Notes (Signed)
 ADVANCED HF CLINIC NOTE   PCP: Amon Aloysius BRAVO, MD Primary Cardiologist: Maude Emmer, MD HF Cardiologist: Dr. Cherrie  HPI: 78 y.o. male with CAD s/p remote stenting of LAD in [place], previous DVT, HTN, CKD IV, PAF and diastolic HF.   Echo 02/01/17 EF 50% grade 2 diastolic mild MR   ETT 02/23/17 normal    Noted new onset AF 07/27/20, started on renal dose Xarelto  15 mg daily. -> s/p DCCV 07/27/20 with conversion to NSR.    In 07/22/21 recurrent AF/AFL  -> Had successful repeat DCCV on 08/05/21 Done on amiodarone  200 mg daily and pradaxa  150 mg bid.  Following with Dr. Cindie for possible Watchman  Referred to Dr Bensimhon for RHC in 6/23 due to LE edema. RHC with prominent v-waves in PCWP tracing suspicious for MR vs severe diastolic dysfunction. TTE with mild MR. 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   RHC 09/28/21  RA = 7 RV = 48/8 PA = 52/16 (34) PCW = 22 (v = 40) Fick cardiac output/index = 4.1/2.0 PVR = 3.2 FA sat = 97% PA sat = 61%, 58%  Echo 6/23 EF 60% strain with cherry on top. RV low normal Mild MR  PYP 10/13/21 markedly positive    cMRI 10/20/21: EF 54% RV 51% Diffuse LGE with ECV 50%  Saw Dr. Timmy Serum light chains mildly elevated. Urine IFE normal . Felt not to have light chain amyloidosis. BMBx negatvie  Follow up 4/25, markedly volume overloaded. Torsemide  increased to 60 mg daily, instructed to take metolazone  2.5 mg/40 KCL x 3 days. Close follow up 3 days later, remained volume up and instructed to use Furoscix  x 2 days, then increase torsemide  to 80 mg daily. Started on Amvuttra .   Echo 4/25: EF 45%  Admitted 11/25 to [facility] with a/c HF, and ? PNA. Diuresed with IV lasix , given abx and discharged home  Today he returns for post hospital HF follow up with his wife. Overall feeling tired. Feels SOB and fatigued. Dizziness has improved, but feels lack of equilibrium when changing positions. Denies palpitations, abnormal bleeding, CP, edema, or PND/Orthopnea.  Appetite ok. Weight at home 192 pounds. Taking all medications, did not have torsemide  yesterday. Frustrated with care at [facility].  Cardiac Studies - Echo 4/25: EF 40-45%, RV moderately down  - cMRI 7/23: LVEF 54%, RVEF 51%, diffuse LGE with ECV 50%  - PYP 6/23: markedly positive  - Echo 6/23: EF 60% strain with cherry on top, RV low/normal, mild MR  - RHC 6/23: RA 7, PA 52/16 (34), PCWP 22, CO/CI (Fick) 4.1/2.0, PVR 3.2  - ETT 02/23/17 normal   - Echo 10/18: EF 50% G2DD, mild MR    Past Medical History:  Diagnosis Date   Arthritis    CAD (coronary artery disease)    Chronic kidney disease    DVT (deep venous thrombosis) (HCC)    following spinal surgery Nov 2017   HTN (hypertension)    MI (myocardial infarction) (HCC)    1999 ; stents placed    Numbness and tingling in both hands    Spondylosis of cervical spine    Current Outpatient Medications  Medication Sig Dispense Refill   acetaminophen  (TYLENOL ) 650 MG CR tablet Take 650 mg by mouth every 8 (eight) hours as needed for pain.     Ascorbic Acid (VITAMIN C) 1000 MG tablet Take 1,000 mg by mouth daily.     aspirin  EC 81 MG tablet Take 81 mg by mouth at  bedtime.     atorvastatin  (LIPITOR) 80 MG tablet TAKE 1 TABLET(80MG ) BY MOUTH EVERY OTHER DAY AND TAKE 1/2 TABLET BY MOUTH ON ALTERNATE DAYS 90 tablet 3   FIBER PO Take 1 Dose by mouth daily.     Multiple Vitamin (MULTIVITAMIN WITH MINERALS) TABS tablet Take 1 tablet by mouth in the morning.     Rivaroxaban  (XARELTO ) 15 MG TABS tablet Take 1 tablet (15 mg total) by mouth daily with supper. 30 tablet 11   Tafamidis  (VYNDAMAX ) 61 MG CAPS Take 1 capsule by mouth daily. 90 capsule 3   torsemide  (DEMADEX ) 20 MG tablet Take 80 mg by mouth daily. (Patient taking differently: Take 60 mg by mouth daily.)     vutrisiran  sodium (AMVUTTRA ) 25 MG/0.5ML syringe Inject 0.5 mLs (25 mg total) into the skin every 3 (three) months. 0.5 mL 3   No current facility-administered  medications for this encounter.   No Known Allergies  Social History   Socioeconomic History   Marital status: Married    Spouse name: Not on file   Number of children: 2   Years of education: Not on file   Highest education level: Not on file  Occupational History   Occupation: retired , nature conservation officer busines   Tobacco Use   Smoking status: Former    Current packs/day: 0.00    Types: Cigarettes    Quit date: 04/26/1983    Years since quitting: 40.8   Smokeless tobacco: Never   Tobacco comments:    quit over 40 years ago   Vaping Use   Vaping status: Never Used  Substance and Sexual Activity   Alcohol use: No   Drug use: No   Sexual activity: Never  Other Topics Concern   Not on file  Social History Narrative   Household pt, wife    [place] from [place] 2011    Social Drivers of Health   Financial Resource Strain: Low Risk  (12/11/2023)   Overall Financial Resource Strain (CARDIA)    Difficulty of Paying Living Expenses: Not hard at all  Food Insecurity: No Food Insecurity (12/11/2023)   Hunger Vital Sign    Worried About Running Out of Food in the Last Year: Never true    Ran Out of Food in the Last Year: Never true  Transportation Needs: No Transportation Needs (12/11/2023)   PRAPARE - Administrator, Civil Service (Medical): No    Lack of Transportation (Non-Medical): No  Physical Activity: Inactive (12/11/2023)   Exercise Vital Sign    Days of Exercise per Week: 0 days    Minutes of Exercise per Session: 0 min  Stress: No Stress Concern Present (12/11/2023)   [employer] of Occupational Health - Occupational Stress Questionnaire    Feeling of Stress: Not at all  Social Connections: Moderately Isolated (12/11/2023)   Social Connection and Isolation Panel    Frequency of Communication with Friends and Family: More than three times a week    Frequency of Social Gatherings with Friends and Family: Never    Attends Religious Services: Never    Doctor, General Practice or Organizations: No    Attends Banker Meetings: Never    Marital Status: Married  Catering Manager Violence: Not At Risk (12/11/2023)   Humiliation, Afraid, Rape, and Kick questionnaire    Fear of Current or Ex-Partner: No    Emotionally Abused: No    Physically Abused: No    Sexually Abused: No   Family History  Problem Relation Age of Onset   Cancer - Other Mother    Heart attack Father    Uterine cancer Sister    Colon cancer Neg Hx    Prostate cancer Neg Hx    BP 120/82   Pulse 90   Ht 5' 8 (1.727 m)   Wt 90.7 kg (200 lb)   SpO2 96%   BMI 30.41 kg/m   Wt Readings from Last 3 Encounters:  03/05/24 90.7 kg (200 lb)  01/29/24 86.2 kg (190 lb)  12/11/23 84.4 kg (186 lb)   PHYSICAL EXAM: General:  NAD. No resp difficulty, walked into clinic with cane, elderly HEENT: Normal Neck: Supple. No JVD. Cor: Irregular rate & rhythm. No rubs, gallops or murmurs. Lungs: Clear Abdomen: Soft, nontender, nondistended.  Extremities: No cyanosis, clubbing, rash, edema Neuro: Alert & oriented x 3, moves all 4 extremities w/o difficulty. Affect pleasant.  ReDs reading: 35 %, normal  ASSESSMENT & PLAN:  1. 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 RV low normal Mild MR - PYP 10/13/21  markedly positive  - cMRI 10/20/21: EF 54% RV 51% Diffuse LGE with ECV 50% - Remains on tafamadis (started 2023) - Echo 08/13/23: EF 40-45% severe LV thickening. RV moderate HK, severe RAE. - NYHA IIb, deconditioning contributing. Volume ok today on exam, weight up a bit. ReDs 35% - Continue torsemide  60 mg daily. Discussed importance of taking full dose daily. - Continue compression stockings.  - Did not tolerate Jardiance . - With neuropathic symptoms in hands and feet, he was started on Amvuttrra.  - Labs reviewed from 03/04/24,  L 4.4, creatinine 2.31  2. Permanent AF -  in setting of cardiac amyloid suspect AF is contributing to HF but Dr Cherrie went back through his ECGs and has been in AF since at least 1/24, so would be difficult candidate for rhythm control.  - If DC-CV contemplated, would need amio - Continue Xarelto  15 mg daily (off Pradaxa  with nosebleeds). No bleeding issues - Not candidate for Watchman due to amyloid  3. CKD IV - Baseline SCr 2.3-2.5 - Follows with [facility] in [place] - Last Scr 2.31 on labs reviewed from yesterday  4. Deconditioning - Would benefit from PREP program or silver sneakers  Follow up next month with Dr. Bensimhon, as scheduled.  Harlene CHRISTELLA Gainer, FNP  3:39 PM

## 2024-03-05 NOTE — Progress Notes (Signed)
 ReDS Vest / Clip - 03/05/24 1532       ReDS Vest / Clip   Station Marker C    Ruler Value 31    ReDS Value Range Low volume    ReDS Actual Value 35

## 2024-03-11 ENCOUNTER — Ambulatory Visit (HOSPITAL_COMMUNITY)

## 2024-04-01 DIAGNOSIS — I4891 Unspecified atrial fibrillation: Secondary | ICD-10-CM | POA: Diagnosis not present

## 2024-04-01 DIAGNOSIS — E854 Organ-limited amyloidosis: Secondary | ICD-10-CM | POA: Diagnosis not present

## 2024-04-01 DIAGNOSIS — I10 Essential (primary) hypertension: Secondary | ICD-10-CM | POA: Diagnosis not present

## 2024-04-01 DIAGNOSIS — D649 Anemia, unspecified: Secondary | ICD-10-CM | POA: Diagnosis not present

## 2024-04-01 DIAGNOSIS — E785 Hyperlipidemia, unspecified: Secondary | ICD-10-CM | POA: Diagnosis not present

## 2024-04-01 DIAGNOSIS — N184 Chronic kidney disease, stage 4 (severe): Secondary | ICD-10-CM | POA: Diagnosis not present

## 2024-04-01 DIAGNOSIS — I509 Heart failure, unspecified: Secondary | ICD-10-CM | POA: Diagnosis not present

## 2024-04-01 DIAGNOSIS — N189 Chronic kidney disease, unspecified: Secondary | ICD-10-CM | POA: Diagnosis not present

## 2024-04-03 ENCOUNTER — Telehealth (HOSPITAL_COMMUNITY): Payer: Self-pay | Admitting: Internal Medicine

## 2024-04-03 NOTE — Telephone Encounter (Signed)
 Called to confirm/remind patient of their appointment at the [facility] on 04/03/2024.   Appointment:   [] Confirmed  [x] Left mess   [] No answer/No voice mail  [] VM Full/unable to leave message  [] Phone not in service  Patient reminded to bring all medications and/or complete list.  Confirmed patient has transportation. Gave directions, instructed to utilize valet parking.

## 2024-04-04 ENCOUNTER — Encounter (HOSPITAL_COMMUNITY): Payer: Self-pay | Admitting: Internal Medicine

## 2024-04-04 ENCOUNTER — Ambulatory Visit (HOSPITAL_COMMUNITY)
Admission: RE | Admit: 2024-04-04 | Discharge: 2024-04-04 | Disposition: A | Discharge status: Home / Self Care | Source: Ambulatory Visit | Attending: Internal Medicine | Admitting: Internal Medicine

## 2024-04-04 ENCOUNTER — Telehealth (HOSPITAL_COMMUNITY): Payer: Self-pay

## 2024-04-04 VITALS — BP 130/80 | HR 98 | Ht 69.0 in | Wt 200.4 lb

## 2024-04-04 DIAGNOSIS — Z955 Presence of coronary angioplasty implant and graft: Secondary | ICD-10-CM | POA: Insufficient documentation

## 2024-04-04 DIAGNOSIS — Z7982 Long term (current) use of aspirin: Secondary | ICD-10-CM | POA: Insufficient documentation

## 2024-04-04 DIAGNOSIS — E854 Organ-limited amyloidosis: Secondary | ICD-10-CM

## 2024-04-04 DIAGNOSIS — N184 Chronic kidney disease, stage 4 (severe): Secondary | ICD-10-CM | POA: Diagnosis not present

## 2024-04-04 DIAGNOSIS — I5032 Chronic diastolic (congestive) heart failure: Secondary | ICD-10-CM | POA: Diagnosis not present

## 2024-04-04 DIAGNOSIS — I252 Old myocardial infarction: Secondary | ICD-10-CM | POA: Diagnosis not present

## 2024-04-04 DIAGNOSIS — Z7901 Long term (current) use of anticoagulants: Secondary | ICD-10-CM | POA: Insufficient documentation

## 2024-04-04 DIAGNOSIS — I5022 Chronic systolic (congestive) heart failure: Secondary | ICD-10-CM

## 2024-04-04 DIAGNOSIS — Z87891 Personal history of nicotine dependence: Secondary | ICD-10-CM | POA: Insufficient documentation

## 2024-04-04 DIAGNOSIS — E8589 Other amyloidosis: Secondary | ICD-10-CM | POA: Diagnosis not present

## 2024-04-04 DIAGNOSIS — Z79899 Other long term (current) drug therapy: Secondary | ICD-10-CM | POA: Diagnosis not present

## 2024-04-04 DIAGNOSIS — I2510 Atherosclerotic heart disease of native coronary artery without angina pectoris: Secondary | ICD-10-CM | POA: Diagnosis not present

## 2024-04-04 DIAGNOSIS — I4821 Permanent atrial fibrillation: Secondary | ICD-10-CM | POA: Diagnosis not present

## 2024-04-04 DIAGNOSIS — I43 Cardiomyopathy in diseases classified elsewhere: Secondary | ICD-10-CM | POA: Diagnosis not present

## 2024-04-04 DIAGNOSIS — I130 Hypertensive heart and chronic kidney disease with heart failure and stage 1 through stage 4 chronic kidney disease, or unspecified chronic kidney disease: Secondary | ICD-10-CM | POA: Insufficient documentation

## 2024-04-04 LAB — CBC
HCT: 29.1 % — ABNORMAL LOW (ref 39.0–52.0)
Hemoglobin: 8.2 g/dL — ABNORMAL LOW (ref 13.0–17.0)
MCH: 22.8 pg — ABNORMAL LOW (ref 26.0–34.0)
MCHC: 28.2 g/dL — ABNORMAL LOW (ref 30.0–36.0)
MCV: 81.1 fL (ref 80.0–100.0)
Platelets: 190 K/uL (ref 150–400)
RBC: 3.59 MIL/uL — ABNORMAL LOW (ref 4.22–5.81)
RDW: 17.3 % — ABNORMAL HIGH (ref 11.5–15.5)
WBC: 6.2 K/uL (ref 4.0–10.5)
nRBC: 0 % (ref 0.0–0.2)

## 2024-04-04 LAB — BASIC METABOLIC PANEL WITH GFR
Anion gap: 11 (ref 5–15)
BUN: 50 mg/dL — ABNORMAL HIGH (ref 8–23)
CO2: 25 mmol/L (ref 22–32)
Calcium: 9.4 mg/dL (ref 8.9–10.3)
Chloride: 104 mmol/L (ref 98–111)
Creatinine, Ser: 2 mg/dL — ABNORMAL HIGH (ref 0.61–1.24)
GFR, Estimated: 33 mL/min — ABNORMAL LOW
Glucose, Bld: 91 mg/dL (ref 70–99)
Potassium: 4.5 mmol/L (ref 3.5–5.1)
Sodium: 140 mmol/L (ref 135–145)

## 2024-04-04 LAB — PRO BRAIN NATRIURETIC PEPTIDE: Pro Brain Natriuretic Peptide: 6926 pg/mL — ABNORMAL HIGH

## 2024-04-04 MED ORDER — APIXABAN 2.5 MG PO TABS: 2.5000 mg | ORAL_TABLET | Freq: Two times a day (BID) | ORAL | 6 refills | Status: AC

## 2024-04-04 NOTE — Addendum Note (Signed)
 Encounter addended by: Jakayden Cancio R, MD on: 04/04/2024 2:54 PM  Actions taken: Level of Service modified, Visit diagnoses modified

## 2024-04-04 NOTE — Progress Notes (Signed)
"   ReDS Vest / Clip - 04/04/24 1126       ReDS Vest / Clip   Station Marker C    Ruler Value 31    ReDS Value Range Moderate volume overload    ReDS Actual Value 36          "

## 2024-04-04 NOTE — Patient Instructions (Signed)
 Medication Changes:  STOP Xarelto   START Eliquis  2.5 mg Twice daily  Lab Work:  Labs done today, your results will be available in MyChart, we will contact you for abnormal readings.   Special Instructions // Education:  Do the following things EVERYDAY: Weigh yourself in the morning before breakfast. Write it down and keep it in a log. Take your medicines as prescribed Eat low salt foods--Limit salt (sodium) to 2000 mg per day.  Stay as active as you can everyday Limit all fluids for the day to less than 2 liters   Follow-Up in: 6 months (June 2026), **PLEASE CALL OUR OFFICE IN [place] TO SCHEDULE THIS APPOINTMENT   At the [facility], you and your health needs are our priority. We have a designated team specialized in the treatment of Heart Failure. This Care Team includes your primary Heart Failure Specialized Cardiologist (physician), Advanced Practice Providers (APPs- Physician Assistants and Nurse Practitioners), and Pharmacist who all work together to provide you with the care you need, when you need it.   You may see any of the following providers on your designated Care Team at your next follow up:  Dr. Toribio Fuel Dr. Ezra Shuck Dr. Odis Brownie Greig Mosses, NP Caffie Shed, [place] [facility] [place], [place] Beckey Coe, NP Jordan Lee, NP Tinnie Redman, PharmD   Please be sure to bring in all your medications bottles to every appointment.   Need to Contact Us :  If you have any questions or concerns before your next appointment please send us  a message through [place] or call our office at [phone].    TO LEAVE A MESSAGE FOR THE NURSE SELECT OPTION 2, PLEASE LEAVE A MESSAGE INCLUDING: YOUR NAME DATE OF BIRTH CALL BACK NUMBER REASON FOR CALL**this is important as we prioritize the call backs  YOU WILL RECEIVE A CALL BACK THE SAME DAY AS LONG AS YOU CALL BEFORE 4:00 PM

## 2024-04-04 NOTE — Telephone Encounter (Signed)
 Advanced Heart Failure Patient Advocate Encounter  Test billing for this patient's current coverage ([employer] Medicare) returns a $0 copay for 90 day supply of Eliquis .  This test claim was processed through [facility]- copay amounts may vary at other pharmacies due to pharmacy/plan contracts, or as the patient moves through the different stages of their insurance plan.  Rachel DEL, CPhT Rx Patient Advocate Phone: [phone]

## 2024-04-04 NOTE — Progress Notes (Addendum)
 "  ADVANCED HF CLINIC NOTE   PCP: Amon Aloysius BRAVO, MD Primary Cardiologist: Maude Emmer, MD HF Cardiologist: Dr. Cherrie  HPI: 78 y.o. male with CAD s/p remote stenting of LAD in [place], previous DVT, HTN, CKD IV, PAF and diastolic HF in setting of wtATTR cardiac amyloidosis.   Echo 02/01/17 EF 50% grade 2 diastolic mild MR   ETT 02/23/17 normal    New onset AF 4/22, started on renal dose Xarelto  15 mg daily. -> s/p DCCV 07/27/20 with conversion to NSR.    In 07/22/21 recurrent AF/AFL  -> Had successful repeat DCCV on 08/05/21 Done on amiodarone  200 mg daily and pradaxa  150 mg bid.  Following with Dr. Cindie for possible Watchman but felt not to be great candidate in setting of amyloid  Referred to Dr Justina Bertini for RHC in 6/23 due to LE edema. RHC with prominent v-waves in PCWP tracing suspicious for MR vs severe diastolic dysfunction. TTE with mild MR. 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   RHC 09/28/21  RA = 7 RV = 48/8 PA = 52/16 (34) PCW = 22 (v = 40) Fick cardiac output/index = 4.1/2.0 PVR = 3.2 FA sat = 97% PA sat = 61%, 58%  Echo 6/23 EF 60% strain with cherry on top. RV low normal Mild MR  PYP 10/13/21 markedly positive    cMRI 10/20/21: EF 54% RV 51% Diffuse LGE with ECV 50%  Saw Dr. Timmy Serum light chains mildly elevated. Urine IFE normal . Felt not to have light chain amyloidosis. BMBx negatvie  Diagnosed with wtATTR cardiac amyloid. Now on tafamadis and vuttisiran  Follow up 4/25, markedly volume overloaded. Torsemide  increased to 60 mg daily, instructed to take metolazone  2.5 mg/40 KCL x 3 days. Close follow up 3 days later, remained volume up and instructed to use Furoscix  x 2 days, then increase torsemide  to 80 mg daily. Started on Amvuttra .   Echo 4/25: EF 45%  Admitted 11/25 to [facility] with a/c HF, and ? PNA. Diuresed with IV lasix , given abx and discharged home  Here for routine f/u. Overall feeling pretty good. Able to do ADLs. Gets a little unsteady when  he stands up btu goes away. Breathing better but still with DOE. Edema well controlled. Easy bleeding with Xarelto .  Cardiac Studies - Echo 4/25: EF 40-45%, RV moderately down  - cMRI 7/23: LVEF 54%, RVEF 51%, diffuse LGE with ECV 50%  - PYP 6/23: markedly positive  - Echo 6/23: EF 60% strain with cherry on top, RV low/normal, mild MR  - RHC 6/23: RA 7, PA 52/16 (34), PCWP 22, CO/CI (Fick) 4.1/2.0, PVR 3.2  - ETT 02/23/17 normal   - Echo 10/18: EF 50% G2DD, mild MR    Past Medical History:  Diagnosis Date   Arthritis    CAD (coronary artery disease)    Chronic kidney disease    DVT (deep venous thrombosis) (HCC)    following spinal surgery Nov 2017   HTN (hypertension)    MI (myocardial infarction) (HCC)    1999 ; stents placed    Numbness and tingling in both hands    Spondylosis of cervical spine    Current Outpatient Medications  Medication Sig Dispense Refill   acetaminophen  (TYLENOL ) 650 MG CR tablet Take 650 mg by mouth every 8 (eight) hours as needed for pain.     Ascorbic Acid (VITAMIN C) 1000 MG tablet Take 1,000 mg by mouth daily.     aspirin  EC 81 MG tablet Take  81 mg by mouth at bedtime.     atorvastatin  (LIPITOR) 80 MG tablet TAKE 1 TABLET(80MG ) BY MOUTH EVERY OTHER DAY AND TAKE 1/2 TABLET BY MOUTH ON ALTERNATE DAYS 90 tablet 3   FIBER PO Take 1 Dose by mouth daily.     Multiple Vitamin (MULTIVITAMIN WITH MINERALS) TABS tablet Take 1 tablet by mouth in the morning.     Rivaroxaban  (XARELTO ) 15 MG TABS tablet Take 1 tablet (15 mg total) by mouth daily with supper. 30 tablet 11   Tafamidis  (VYNDAMAX ) 61 MG CAPS Take 1 capsule by mouth daily. 90 capsule 3   torsemide  (DEMADEX ) 20 MG tablet Take 80 mg by mouth daily. (Patient taking differently: Take 60 mg by mouth daily.)     vutrisiran  sodium (AMVUTTRA ) 25 MG/0.5ML syringe Inject 0.5 mLs (25 mg total) into the skin every 3 (three) months. 0.5 mL 3   No current facility-administered medications for this encounter.    No Known Allergies  Social History   Socioeconomic History   Marital status: Married    Spouse name: Not on file   Number of children: 2   Years of education: Not on file   Highest education level: Not on file  Occupational History   Occupation: retired , nature conservation officer busines   Tobacco Use   Smoking status: Former    Current packs/day: 0.00    Types: Cigarettes    Quit date: 04/26/1983    Years since quitting: 40.9   Smokeless tobacco: Never   Tobacco comments:    quit over 40 years ago   Vaping Use   Vaping status: Never Used  Substance and Sexual Activity   Alcohol use: No   Drug use: No   Sexual activity: Never  Other Topics Concern   Not on file  Social History Narrative   Household pt, wife    Moved from [place] 2011    Social Drivers of Health   Tobacco Use: Medium Risk (04/04/2024)   Patient History    Smoking Tobacco Use: Former    Smokeless Tobacco Use: Never    Passive Exposure: Not on Actuary Strain: Low Risk (12/11/2023)   Overall Financial Resource Strain (CARDIA)    Difficulty of Paying Living Expenses: Not hard at all  Food Insecurity: No Food Insecurity (12/11/2023)   Epic    Worried About Radiation Protection Practitioner of Food in the Last Year: Never true    Ran Out of Food in the Last Year: Never true  Transportation Needs: No Transportation Needs (12/11/2023)   Epic    Lack of Transportation (Medical): No    Lack of Transportation (Non-Medical): No  Physical Activity: Inactive (12/11/2023)   Exercise Vital Sign    Days of Exercise per Week: 0 days    Minutes of Exercise per Session: 0 min  Stress: No Stress Concern Present (12/11/2023)   [employer] of Occupational Health - Occupational Stress Questionnaire    Feeling of Stress: Not at all  Social Connections: Moderately Isolated (12/11/2023)   Social Connection and Isolation Panel    Frequency of Communication with Friends and Family: More than three times a week    Frequency of Social  Gatherings with Friends and Family: Never    Attends Religious Services: Never    Database Administrator or Organizations: No    Attends Banker Meetings: Never    Marital Status: Married  Catering Manager Violence: Not At Risk (12/11/2023)   Epic    Fear  of Current or Ex-Partner: No    Emotionally Abused: No    Physically Abused: No    Sexually Abused: No  Depression (PHQ2-9): Low Risk (01/29/2024)   Depression (PHQ2-9)    PHQ-2 Score: 0  Alcohol Screen: Low Risk (12/11/2023)   Alcohol Screen    Last Alcohol Screening Score (AUDIT): 0  Housing: Unknown (12/11/2023)   Epic    Unable to Pay for Housing in the Last Year: No    Number of Times Moved in the Last Year: Not on file    Homeless in the Last Year: No  Utilities: Not At Risk (12/11/2023)   Epic    Threatened with loss of utilities: No  Health Literacy: Adequate Health Literacy (12/11/2023)   B1300 Health Literacy    Frequency of need for help with medical instructions: Never   Family History  Problem Relation Age of Onset   Cancer - Other Mother    Heart attack Father    Uterine cancer Sister    Colon cancer Neg Hx    Prostate cancer Neg Hx    BP 130/80   Pulse 98   Ht 5' 9 (1.753 m)   Wt 90.9 kg (200 lb 6.4 oz)   SpO2 98%   BMI 29.59 kg/m   Wt Readings from Last 3 Encounters:  04/04/24 90.9 kg (200 lb 6.4 oz)  03/05/24 90.7 kg (200 lb)  01/29/24 86.2 kg (190 lb)   PHYSICAL EXAM: General:  Elderly. No resp difficulty HEENT: normal Neck: supple. no JVD.  Cor: Irregular rate & rhythm. No rubs, gallops or murmurs. Lungs: clear Abdomen: soft, nontender, nondistended.Good bowel sounds. Extremities: no cyanosis, clubbing, rash, tr edema Neuro: alert & orientedx3, cranial nerves grossly intact. moves all 4 extremities w/o difficulty. Affect pleasant   ReDs reading: 36 %, normal  ASSESSMENT & PLAN:  1. 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 RV low normal Mild MR - PYP 10/13/21  markedly positive  - cMRI 10/20/21: EF 54% RV 51% Diffuse LGE with ECV 50% - Remains on tafamadis (started 2023) and Amvuttra  - Echo 08/13/23: EF 40-45% severe LV thickening. RV moderate HK, severe RAE. - Stable NYHA II-III Volume ok ReDS 36% - Continue torsemide  60 mg daily. Can take extra as needed - Continue compression stockings.  - Did not tolerate Jardiance .  - Labs today  2. Permanent AF -  in setting of cardiac amyloid suspect AF is contributing to HF but we went back through his ECGs and has been in AF since at least 1/24, so would be difficult candidate for rhythm control.  - If DC-CV contemplated, would need amio - Given bleeding will switch Xarelto  to apixaban  2.5 bid - D/w Dr. Almetta. If fails apixaban  can consider Watchman - I discussed this with him today  3. CKD IV - Baseline SCr 2.3-2.5 - Follows with [facility] in [place] - Check labs    Toribio Fuel, MD  11:38 AM "

## 2024-04-28 DIAGNOSIS — R918 Other nonspecific abnormal finding of lung field: Secondary | ICD-10-CM

## 2024-05-01 ENCOUNTER — Other Ambulatory Visit: Payer: Self-pay | Admitting: Cardiovascular Disease

## 2024-05-01 NOTE — Telephone Encounter (Signed)
 In accordance with refill protocols, please review and address the following requirements before this medication refill can be authorized:  Labs

## 2024-05-05 ENCOUNTER — Other Ambulatory Visit (HOSPITAL_COMMUNITY): Payer: Self-pay

## 2024-05-05 ENCOUNTER — Telehealth (HOSPITAL_COMMUNITY): Payer: Self-pay

## 2024-05-05 NOTE — Telephone Encounter (Signed)
 Advanced Heart Failure Patient Advocate Encounter  The patient was approved for a [employer] grant that will help cover the cost of Amvuttra , Eliquis , Vyndamax .  Total amount awarded, $7,500.  Effective: 05/05/2024 - 05/04/2025.  BIN [id number] PCN [id number] Group [id number] ID [id number]  Pharmacy provided with approval and processing information. Confirmed $0 copay for Eliquis . Patient informed via phone.  Rachel DEL, CPhT Rx Patient Advocate Phone: [phone]

## 2024-05-19 ENCOUNTER — Other Ambulatory Visit (HOSPITAL_COMMUNITY): Payer: Self-pay

## 2024-05-19 ENCOUNTER — Other Ambulatory Visit (HOSPITAL_COMMUNITY): Payer: Self-pay | Admitting: Internal Medicine

## 2024-05-19 ENCOUNTER — Other Ambulatory Visit: Payer: Self-pay

## 2024-05-19 ENCOUNTER — Telehealth (HOSPITAL_COMMUNITY): Payer: Self-pay

## 2024-05-19 MED ORDER — VYNDAMAX 61 MG PO CAPS
1.0000 | ORAL_CAPSULE | Freq: Every day | ORAL | 3 refills | Status: AC
  Filled 2024-05-19 – 2024-05-20 (×2): qty 90, 90d supply, fill #0

## 2024-05-20 ENCOUNTER — Other Ambulatory Visit: Payer: Self-pay

## 2024-05-22 ENCOUNTER — Other Ambulatory Visit: Payer: Self-pay | Admitting: Pharmacy Technician

## 2024-05-22 ENCOUNTER — Other Ambulatory Visit: Payer: Self-pay

## 2024-05-22 NOTE — Progress Notes (Signed)
 Specialty Pharmacy Refill Coordination Note  Gregory Guerrero is a 79 y.o. male contacted today regarding refills of specialty medication(s) Tafamidis  (Vyndamax )   Patient requested Delivery   Delivery date: 05/26/24   Verified address: [address] [name], [address]   Medication will be filled on: 05/27/24

## 2024-05-23 ENCOUNTER — Other Ambulatory Visit: Payer: Self-pay

## 2024-05-23 ENCOUNTER — Other Ambulatory Visit: Payer: Self-pay | Admitting: Pharmacy Technician

## 2024-05-23 NOTE — Progress Notes (Unsigned)
 Specialty Pharmacy Refill Coordination Note  Gregory Guerrero is a 79 y.o. male assessed today regarding refills of clinic administered specialty medication(s) Vutrisiran  Sodium (Amvuttra )   Clinic requested Courier to Provider Office   Delivery date: 05/28/24   Verified address: [facility] [address] [place]   Medication will be filled on: 05/27/24

## 2024-06-03 ENCOUNTER — Ambulatory Visit: Admitting: Internal Medicine

## 2024-06-03 ENCOUNTER — Ambulatory Visit (HOSPITAL_COMMUNITY)

## 2024-12-23 ENCOUNTER — Ambulatory Visit
